# Patient Record
Sex: Male | Born: 1945 | Race: White | Hispanic: No | Marital: Single | Smoking: Former smoker
Health system: Southern US, Community
[De-identification: ages and names within clinical notes are randomized; demographics above are authoritative.]

## PROBLEM LIST (undated history)

## (undated) DIAGNOSIS — I10 Essential (primary) hypertension: Secondary | ICD-10-CM

## (undated) DIAGNOSIS — F32A Depression, unspecified: Secondary | ICD-10-CM

## (undated) DIAGNOSIS — I2582 Chronic total occlusion of coronary artery: Secondary | ICD-10-CM

## (undated) DIAGNOSIS — J449 Chronic obstructive pulmonary disease, unspecified: Secondary | ICD-10-CM

## (undated) DIAGNOSIS — E785 Hyperlipidemia, unspecified: Secondary | ICD-10-CM

## (undated) DIAGNOSIS — F209 Schizophrenia, unspecified: Secondary | ICD-10-CM

## (undated) DIAGNOSIS — K219 Gastro-esophageal reflux disease without esophagitis: Secondary | ICD-10-CM

## (undated) DIAGNOSIS — F329 Major depressive disorder, single episode, unspecified: Secondary | ICD-10-CM

---
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
## 2008-08-03 ENCOUNTER — Ambulatory Visit: Payer: Self-pay | Admitting: Gastroenterology

## 2012-06-09 ENCOUNTER — Ambulatory Visit: Payer: Self-pay | Admitting: Vascular Surgery

## 2012-06-09 LAB — CREATININE, SERUM: Creatinine: 1.05 mg/dL (ref 0.60–1.30)

## 2012-06-09 LAB — PROTIME-INR
INR: 1.8
Prothrombin Time: 20.6 secs — ABNORMAL HIGH (ref 11.5–14.7)

## 2012-06-16 ENCOUNTER — Ambulatory Visit: Payer: Self-pay | Admitting: Vascular Surgery

## 2012-06-16 LAB — BUN: BUN: 17 mg/dL (ref 7–18)

## 2012-06-16 LAB — CREATININE, SERUM: EGFR (African American): >60

## 2012-06-16 LAB — PROTIME-INR
INR: 1.7
Prothrombin Time: 20.2 secs — ABNORMAL HIGH (ref 11.5–14.7)

## 2012-07-16 ENCOUNTER — Inpatient Hospital Stay: Payer: Self-pay | Admitting: Vascular Surgery

## 2012-07-16 LAB — CBC WITH DIFFERENTIAL/PLATELET
Basophil %: 0.5 %
Eosinophil #: 0.2 10*3/uL (ref 0.0–0.7)
Eosinophil %: 2.8 %
HCT: 35 % — ABNORMAL LOW (ref 40.0–52.0)
HGB: 11.7 g/dL — ABNORMAL LOW (ref 13.0–18.0)
Lymphocyte %: 22.4 %
MCH: 28.1 pg (ref 26.0–34.0)
MCHC: 33.5 g/dL (ref 32.0–36.0)
MCV: 84 fL (ref 80–100)
RDW: 13.7 % (ref 11.5–14.5)
WBC: 6.1 10*3/uL (ref 3.8–10.6)

## 2012-07-16 LAB — BUN: BUN: 13 mg/dL (ref 7–18)

## 2012-07-16 LAB — HEMOGLOBIN: HGB: 12.1 g/dL — ABNORMAL LOW (ref 13.0–18.0)

## 2012-07-16 LAB — FIBRINOGEN: Fibrinogen: 409 mg/dL (ref 210–470)

## 2012-07-16 LAB — CREATININE, SERUM
Creatinine: 1.16 mg/dL (ref 0.60–1.30)
EGFR (African American): >60

## 2012-07-16 LAB — PROTIME-INR: Prothrombin Time: 19.8 secs — ABNORMAL HIGH (ref 11.5–14.7)

## 2012-07-16 LAB — APTT: Activated PTT: 41.4 secs — ABNORMAL HIGH (ref 23.6–35.9)

## 2012-07-16 LAB — PLATELET COUNT: Platelet: 201 10*3/uL (ref 150–440)

## 2012-07-17 LAB — CBC WITH DIFFERENTIAL/PLATELET
Basophil #: 0 10*3/uL (ref 0.0–0.1)
Eosinophil %: 3 %
HCT: 34.2 % — ABNORMAL LOW (ref 40.0–52.0)
HGB: 11.8 g/dL — ABNORMAL LOW (ref 13.0–18.0)
Lymphocyte %: 20.4 %
MCH: 28.6 pg (ref 26.0–34.0)
MCHC: 34.4 g/dL (ref 32.0–36.0)
Monocyte #: 0.7 x10 3/mm (ref 0.2–1.0)
Neutrophil #: 4 10*3/uL (ref 1.4–6.5)
Neutrophil %: 64.9 %
Platelet: 174 10*3/uL (ref 150–440)
RBC: 4.12 10*6/uL — ABNORMAL LOW (ref 4.40–5.90)
RDW: 13.9 % (ref 11.5–14.5)
WBC: 6.1 10*3/uL (ref 3.8–10.6)

## 2012-07-17 LAB — BASIC METABOLIC PANEL
Co2: 31 mmol/L (ref 21–32)
Creatinine: 1.09 mg/dL (ref 0.60–1.30)
EGFR (African American): >60
EGFR (Non-African Amer.): >60
Glucose: 95 mg/dL (ref 65–99)
Potassium: 4 mmol/L (ref 3.5–5.1)
Sodium: 140 mmol/L (ref 136–145)

## 2012-07-17 LAB — FIBRINOGEN
Fibrinogen: 258 mg/dL (ref 210–470)
Fibrinogen: 277 mg/dL (ref 210–470)

## 2012-07-30 ENCOUNTER — Ambulatory Visit: Payer: Self-pay | Admitting: Vascular Surgery

## 2012-07-30 LAB — BUN: BUN: 11 mg/dL (ref 7–18)

## 2012-07-30 LAB — CREATININE, SERUM
Creatinine: 1.12 mg/dL (ref 0.60–1.30)
EGFR (African American): >60

## 2012-08-15 DIAGNOSIS — I739 Peripheral vascular disease, unspecified: Secondary | ICD-10-CM | POA: Insufficient documentation

## 2013-07-29 IMAGING — XA IR VASCULAR PROCEDURE
12 of 15 series · 15 of 24 positions shown · IV contrast (IODINE)
Comparison: none

[Series 1: care sfa · 1 of 2 slices shown (1 of 9)]
[im 1/2]
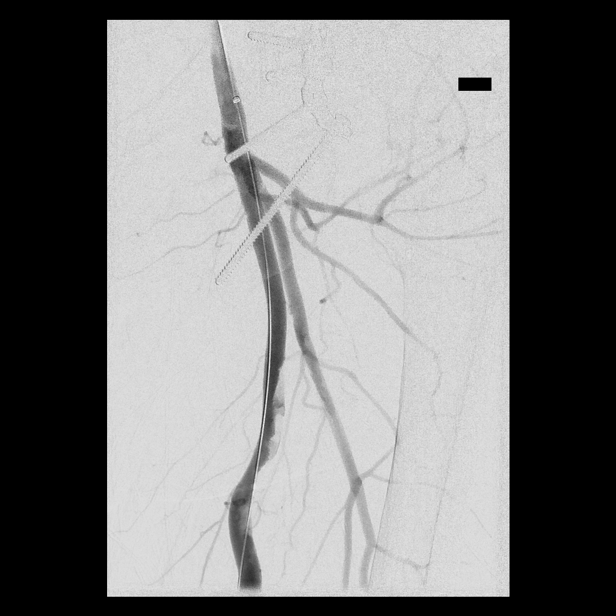

[Series 3: care sfa · 1 of 2 slices shown (2 of 9)]
[im 1/2]
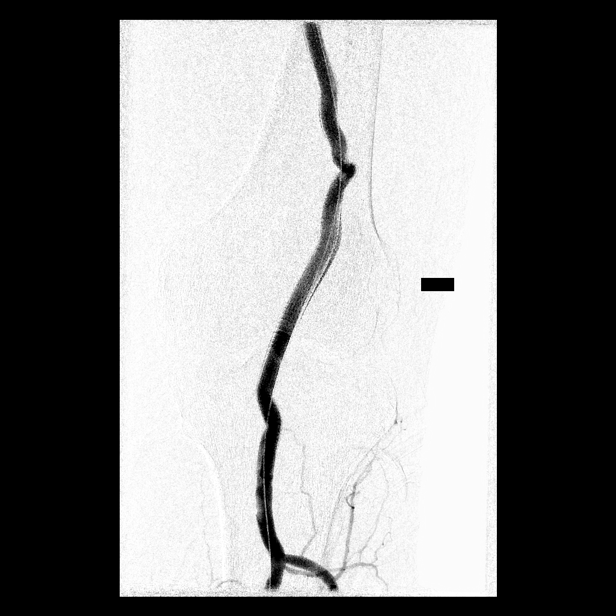

[Series 4: care sfa · 2 of 2 slices shown (3 of 9)]
[im 1/2]
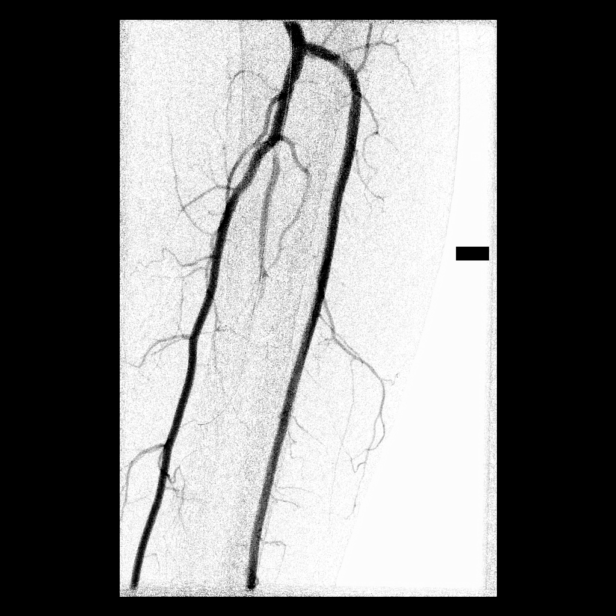
[im 2/2]
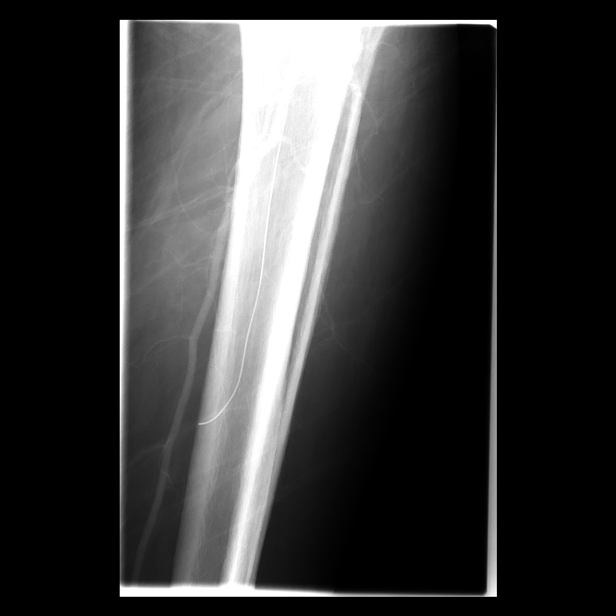

[Series 5: care sfa · 2 of 3 slices shown (4 of 9)]
[im 2/3]
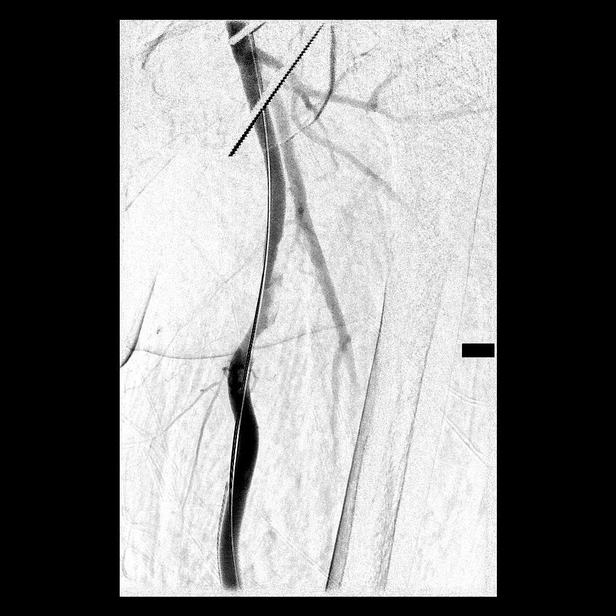
[im 3/3]
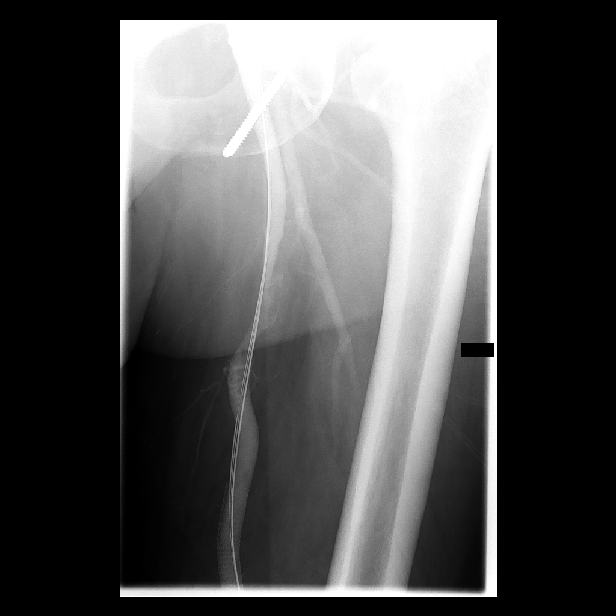

[Series 6: care sfa · 1 of 2 slices shown (5 of 9)]
[im 2/2]
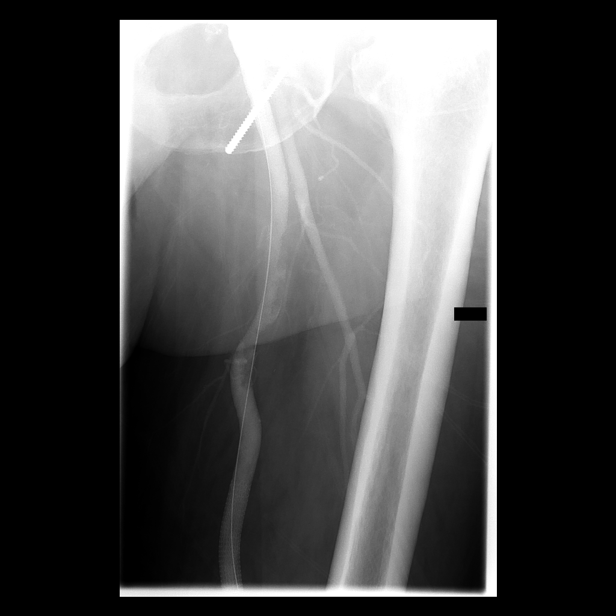

[Series 8: fl - angio · 1 of 1 slices shown (1 of 3)]
[im 1/1]
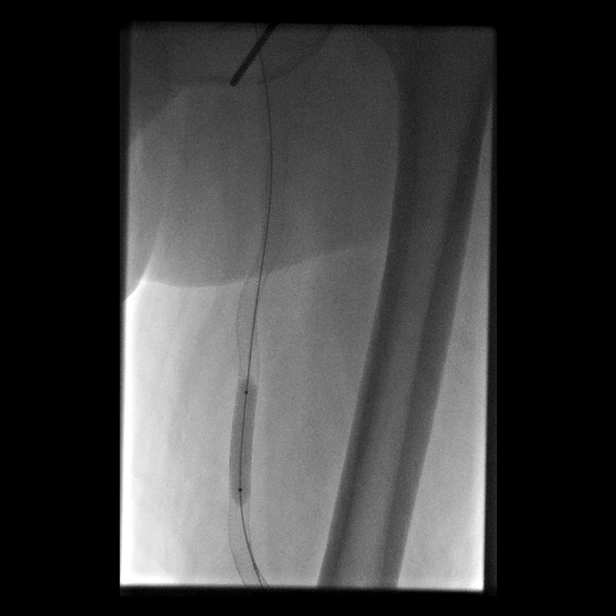

[Series 9: fl - angio · 1 of 1 slices shown (2 of 3)]
[im 1/1]
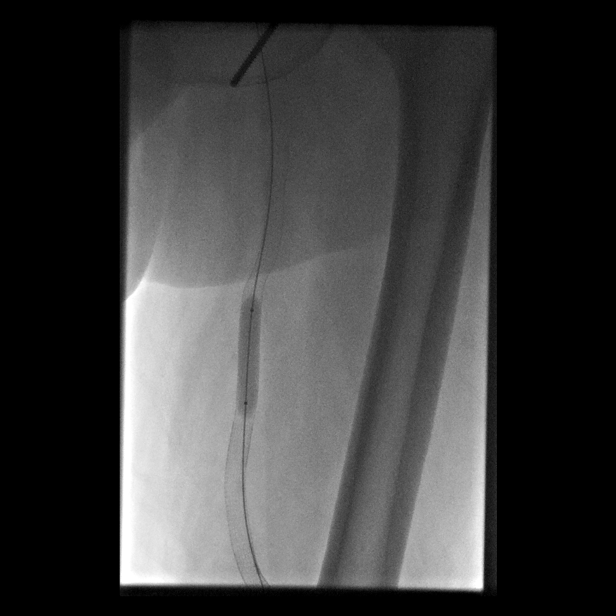

[Series 11: fl - angio · 1 of 1 slices shown (3 of 3)]
[im 1/1]
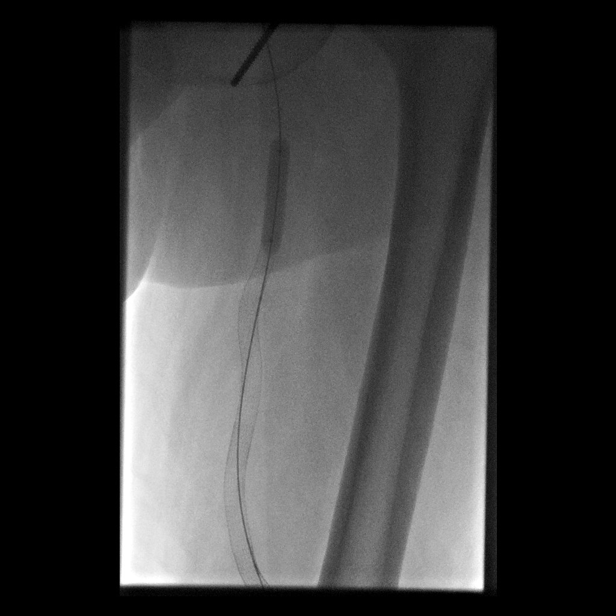

[Series 12: care sfa · 1 of 2 slices shown (6 of 9)]
[im 1/2]
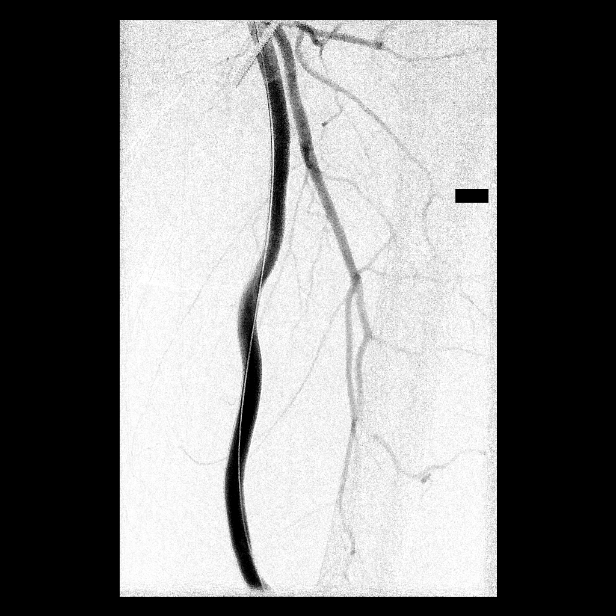

[Series 13: care sfa · 1 of 2 slices shown (7 of 9)]
[im 1/2]
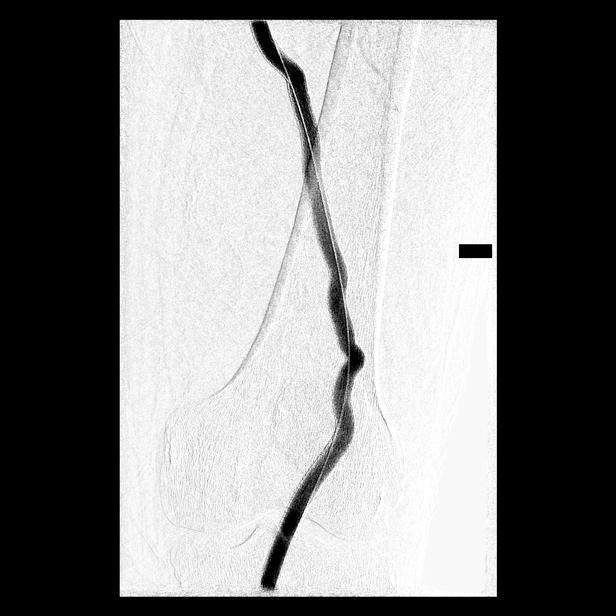

[Series 14: care sfa · 2 of 2 slices shown (8 of 9)]
[im 1/2]
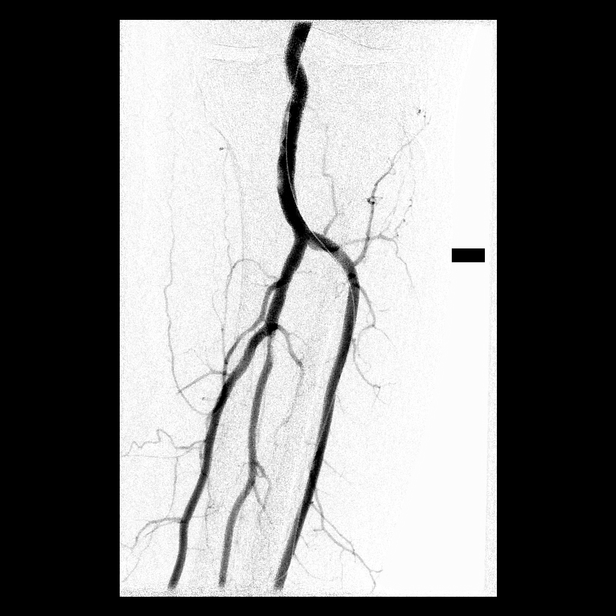
[im 2/2]
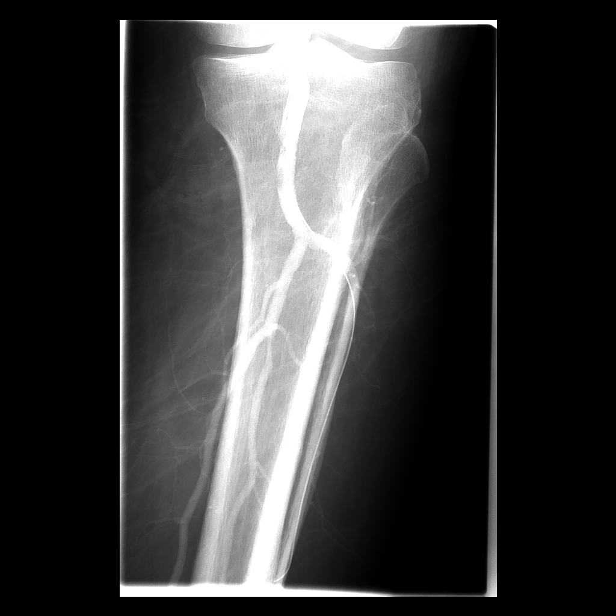

[Series 15: care sfa · 1 of 2 slices shown (9 of 9)]
[im 2/2]
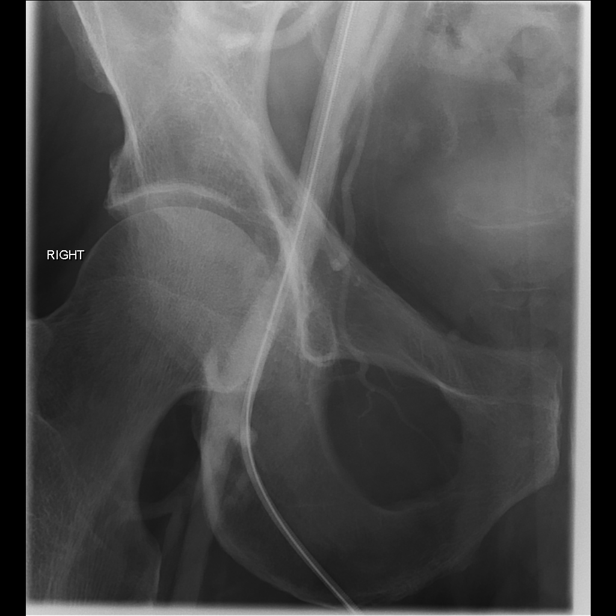

[15 of 24 positions shown; findings below may reference images not displayed]

IMAGES IMPORTED FROM THE [employer] WORKFLOW SYSTEM
NO DICTATION FOR STUDY

## 2014-04-30 NOTE — Discharge Summary (Signed)
PATIENT NAME:  Anthony Chambers, Anthony Chambers MR#:  [id number] DATE OF BIRTH:  November 20, 1945  DATE OF ADMISSION:  07/16/2012 DATE OF DISCHARGE:  07/18/2012.   ADMITTING DIAGNOSES: 1.  Ischemic left lower extremity.  2.  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  3.  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   DISCHARGE DIAGNOSES: 1.  Ischemic left lower extremity.  2.  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  3.  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   PROCEDURE PERFORMED WHILE IN THE HOSPITAL:  Percutaneous thrombolysis and revascularization of the left lower extremity on July 9th and July 17, 2012.   BRIEF HISTORY:  The patient is a 69 year old white male with multiple issues who had about a month ago very large popliteal and SFA aneurysms treated with stent placement. These thrombosed and he had an ischemic left lower extremity with pain. He was brought in for an attempt at opening these and limb salvage.   HOSPITAL COURSE:  The patient was admitted, taken to the Vascular Interventional Radiology Suite where percutaneous thrombolysis initiated on 07/16/2012. He was admitted to the Critical Care Unit overnight with continuous thrombolysis and taken back the next morning where we were able to completely revascularize his left lower extremity. By the following morning, he had 2+ pedal pulses on the left, and his right groin looked fine. His perfusion was intact; however, he was markedly weak and below his normal baseline of functioning. We had Physical Therapy to the patient who did not feel he was safe to go home and he was recommended for short-term rehab. He will be discharged to short-term rehab.  MEDICATIONS:  Will be 1.  Ketoconazole topical to apply twice weekly to affected areas.  2.  Olanzapine 15 mg b.i.d.  3.  Omeprazole 20 mg daily.  4.  Paxil 40 mg daily.   5.  Risperdal 3 mg b.i.d.  6.  Zocor 20 mg daily.  7.  Ziprasidone 60 mg daily.  8.  Aspirin 81 mg daily.  9.  Xarelto 15 mg b.i.d. for 3 weeks and then 20 mg daily thereafter.  10.  Hydrocodone/acetaminophen as needed for pain.  He will follow up in our office in about 2 to 3 weeks with ABIs. His activity is as tolerated. His diet is regular. His dressing can be replaced as necessary. They can contact our office for any fevers greater than 101, severe pain, wound issues or other problems.   ____________________________ Annice NeedyJason S. Dew, MD jsd:jm D: 07/18/2012 15:20:09 ET T: 07/18/2012 15:41:01 ET JOB#: [id number]  cc: Annice NeedyJason S. Dew, MD, <Dictator> Annice NeedyJASON S DEW MD ELECTRONICALLY SIGNED 07/24/2012 9:51

## 2014-04-30 NOTE — Op Note (Signed)
PATIENT NAME:  Anthony Chambers, Thurston C MR#:  [id number] DATE OF BIRTH:  [birth year]  DATE OF PROCEDURE:  07/30/2012  PREOPERATIVE DIAGNOSES: 1.  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  2.  HEPARIN ALLERGY.  3.  Status post previous repair with heparin-coated stents with subsequent stent thrombosis and thrombolysis for ischemic limb.   POSTOPERATIVE DIAGNOSES:  1.  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  2.  HEPARIN ALLERGY.  3.  Status post previous repair with heparin-coated stents with subsequent stent thrombosis and thrombolysis for ischemic limb.   PROCEDURE: 1.  Ultrasound guidance for vascular access, right femoral artery.  2.  Catheter placement into left popliteal artery from right femoral approach.  3.  Left lower extremity angiogram.  4.  Covered stent placement x 4 with two 7 mm diameter and two 8 mm diameter Viabahn nonheparin-coated stents to the left superficial femoral artery and popliteal artery.  5.  StarClose closure device right femoral artery.   SURGEON: Annice NeedyJason S Adnan Vanvoorhis, MD   ANESTHESIA: Local with moderate conscious sedation.   ESTIMATED BLOOD LOSS: 25 mL.   INDICATION FOR PROCEDURE: This is a 69 year old male who is well-known to our service. 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 This was repaired successfully a couple of months ago but had an early thrombosis. He underwent thrombolysis and limb salvage. Due to the unusual early thrombosis, a heparin antibody panel was sent which came back as positive after his last admission, and the previous stents had been heparin-coated. He is brought back to reline the area with nonheparin-coated stents in an attempt to avoid thrombosis. The risks and benefits were discussed. Informed consent was obtained.   DESCRIPTION OF PROCEDURE: The patient was brought to the vascular interventional radiology suite. Groins were shaved and prepped, and a sterile surgical field was created.  Ultrasound was used to visualize a patent right femoral artery. It was accessed under direct ultrasound guidance without difficulty with a Seldinger needle. A J-wire and 5-French sheath were placed. I used a RIM catheter and the Advantage wire to cross the aortic bifurcation and advance to the left femoral head. Selective left lower extremity angiogram was then performed. This showed that currently the stents were patent, and the runoff was maintained distally.  I then gave the patient a dose of Angiomax and placed an 8-French Ansell sheath over a Constellation Brandserumo Advantage Wire. The wire was parked into the peroneal artery, and I then relined from the below-knee popliteal artery with about 2 cm of overlap below the previously placed stent up to the proximal superficial femoral artery with about 2 cm of overlap above the previously placed stent. There was at least 2 cm of overlap between each stent junction, and no area of heparin-coated stent should be now exposed. These were all postdilated with a 7 mm balloon inflated to burst pressure to make 7.7 mm in diameter, and completion angiogram showed them to be widely patent. I then performed a StarClose closure device on the right femoral artery with excellent hemostatic result. The patient tolerated the procedure well and was taken to the recovery room in stable condition.   ____________________________ Annice NeedyJason S. Arris Meyn, MD jsd:cb D: 07/30/2012 13:56:23 ET T: 07/30/2012 17:16:27 ET JOB#: [id number]  cc: Annice NeedyJason S. Keven Soucy, MD, <Dictator> Annice NeedyJASON S Sidonie Dexheimer MD ELECTRONICALLY SIGNED 08/04/2012 12:28

## 2014-04-30 NOTE — Op Note (Signed)
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   PROCEDURES:  1. Catheter placement into left popliteal artery from right femoral approach.  2. Left lower extremity angiogram.  3. Covered stent placement x3 to the left superficial femoral artery and popliteal artery for aneurysm repair using two 7 mm diameter Viabahns and one 8 mm diameter Viabahn stent.  4. StarClose closure device, right femoral artery.   SURGEON: Annice NeedyJason S. Jaisen Wiltrout, MD   ANESTHESIA: Local with moderate conscious sedation.   ESTIMATED BLOOD LOSS: Approximately 50 mL.   INDICATION FOR PROCEDURE: This is a 69 year old white male who has a very large left SFA and popliteal artery aneurysm. This was seen on a previous ultrasound, and I performed a diagnostic study for measurements and sizing last week. He is here for definitive repair today with covered stents.   DESCRIPTION OF THE PROCEDURE: The patient was brought to the vascular interventional radiology suite. Groins were shaved and prepped, and sterile surgical field is created. The right femoral head was localized with fluoroscopy. Ultrasound was used to access the right femoral artery, which was patent. This was done under direct ultrasound guidance without difficulty with Seldinger needle and permanent image recorded. A 5 French sheath was placed. As we had already done an aortogram, we did not perform an aortogram today, and crossed the aortic bifurcation with a RIM catheter and placed a Terumo Advantage wire. I then placed a 7 French Ansel sheath and performed left lower extremity imaging. I was able to navigate through the multiple areas of tortuosity and dilatation of the aneurysm and gain catheter access into the popliteal artery  with the Terumo Advantage wire and a Kumpe catheter. I then exchanged for an 0.018 wire. There was significant stored energy over the wire, but ultimately I was able to track and cover the entirety of the lesion. This took 3 Viabahn stents to cover the extensive disease. This went down to the distal popliteal artery and up into the mid to proximal superficial femoral artery. The 2 distal stents were 7 mm diameter Viabahn stents, and the proximal stent with an 8 mm diameter Viabahn stent. These were then postdilated, after exchanging for an 0.035 wire, with a 7 mm balloon in the distal portion and then an 8 mm balloon throughout the mid and proximal portions of the stents. There was about 3 to 4 cm of overlap between all stents at least and about 4 to 5 cm of nonaneurysmal artery distally and about 5 cm of nonaneurysmal artery proximally, but we seemed to be sealed in with the Viabahn stents. At this point, completion angiogram was performed which showed exclusion of the aneurysm. There did appear to be what would equate to a type II endoleak through branches into there, but this was a small amount of flow, and the stents were patent. The runoff was maintained distally, and he had palpable pulses in his feet. At this point, I terminated the procedure. The sheath was pulled back to the ipsilateral external iliac artery, and oblique arteriogram was performed. StarClose closure device was deployed in the usual fashion with excellent hemostatic result. The patient tolerated the procedure well and was taken to the recovery room in stable condition.    ____________________________ Annice NeedyJason S. Savvas Roper, MD jsd:OSi D: 06/16/2012 18:10:35  ET T: 06/17/2012 05:48:04 ET JOB#: [id number]  cc: Annice Needy, MD, <Dictator> Annice Needy MD ELECTRONICALLY SIGNED 06/18/2012 11:00

## 2014-04-30 NOTE — Op Note (Signed)
PATIENT NAME:  Anthony Chambers, [name] MR#:  [id number] DATE OF BIRTH:  08-19-1945  DATE OF PROCEDURE:  06/09/2012  PREOPERATIVE DIAGNOSES: 1.  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  2.  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  3.  Hyperlipidemia.   POSTOPERATIVE DIAGNOSES:   1.  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  2.  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  3.  Hyperlipidemia.   PROCEDURES: 1.  Ultrasound guidance for vascular access, right femoral artery.  2.  Catheter placed in the left superficial femoral artery and.  3.  Aortogram and selective left lower extremity angiogram.  4.  StarClose closure of right femoral artery.   SURGEON: Annice NeedyJason S. Kareli Hossain, M.D.   ANESTHESIA: Local with moderate conscious sedation.   ESTIMATED BLOOD LOSS: Approximately 25 mL   CONTRAST USED: 70 mL Visipaque.   INDICATION FOR PROCEDURE: This is a 69 year old white male with a large SFA and popliteal artery aneurysm. This was found last week on noninvasive studies. He is brought in for angiography for further evaluation for best form of  treatment. Risks and benefits were discussed. Informed consent was obtained.   DESCRIPTION OF PROCEDURE: The patient is brought to the vascular interventional radiology suite. The groins were sterilely prepped and draped and a sterile surgical field is created.  The right femoral head was localized with fluoroscopy.  The right femoral artery was visualized with ultrasound and found to be patent. It was then accessed with direct ultrasound guidance without difficulty with Seldinger needle. A J-wire and 5 French sheath were placed. Pigtail catheter was placed in the aorta at the L1 level and AP aortogram was performed. This showed reasonably large aorta and iliac arteries. These were not aneurysmal and there were widely patent. I then hooked the aortic bifurcation and advanced to the left femoral head and selective left lower  extremity angiogram was then performed. This showed a normal common femoral artery, profunda femoris artery and proximal superficial femoral artery.  The superficial femoral artery up in this area is generous, in the 7 to 8 mm diameter range. In the mid to distal superficial femoral artery, there began a very large aneurysm. This had about 5 cm diameter flow lumen. There was an ectatic, but not aneurysmal portion just below Hunter's canal and in the popliteal artery was aneurysmal just above the knee with stenosis in the popliteal artery just below the knee. The below-knee popliteal artery was 5 to 6 mm range. There was a normal tibial trifurcation and two vessel runoff distally. I did pass a wire through there and was able to cross the lesion. However, we did not have appropriate length and size and Viabahn stents to treat this today and these have to be special ordered due to the large size in length of the aneurysm. The total length of the aneurysm was over 20 cm to 25 cm and there were caliber changes that will likely result placement of 2 to 3 stents at the time of repair.  At this point, I elected to terminate the procedure. The diagnostic catheter was removed. Oblique arteriogram was performed of the right femoral artery and StarClose closure device was deployed in the usual fashion with excellent hemostatic result. The patient tolerated the procedure well and was taken to the recovery room in stable condition.   ____________________________ Annice NeedyJason S. Ijanae Macapagal, MD jsd:cc D: 06/09/2012 17:02:04 ET T: 06/09/2012 22:48:03 ET JOB#: [id number]  cc: Barbara CowerJason  Driscilla Grammes, MD, <Dictator> Annice Needy MD ELECTRONICALLY SIGNED 06/18/2012 10:57

## 2014-04-30 NOTE — Op Note (Signed)
PATIENT NAME:  Anthony Chambers, Anthony Chambers MR#:  [id number] DATE OF BIRTH:  23-Nov-1945  DATE OF PROCEDURE:  07/17/2012  PREOPERATIVE DIAGNOSES:  1.  Ischemic rest pain, left lower extremity.  2.  Thrombosis of previous left superficial femoral artery and popliteal aneurysms.  3.  Status post placement of infusion catheter for overnight thrombolysis.   POSTOPERATIVE DIAGNOSES: 1.  Ischemic rest pain, left lower extremity.  2.  Thrombosis of previous left superficial femoral artery and popliteal aneurysms.  3.  Status post placement of infusion catheter for overnight thrombolysis.   PROCEDURE: 1.  Followup angiogram through existing catheter, left lower extremity.  2.  Mechanical rheolytic thrombectomy of left superficial femoral artery for residual thrombus after thrombolysis. 3.  Covered stent placement to the proximal superficial femoral artery for residual thrombus after thrombolysis and thrombectomy.  4.  StarClose closure device, right femoral artery.   SURGEON:  Annice NeedyJason S Dew, M.D.   ANESTHESIA:  Local with moderate conscious sedation.   ESTIMATED BLOOD LOSS:  Minimal.   FLUOROSCOPY TIME:  Approximately 6 minutes and 35 mL of contrast were used.  INDICATION FOR PROCEDURE:  A 69 year old male who was brought down yesterday for initiation of thrombolysis treatment for occluded SFA and popliteal stents that had been placed for aneurysmal disease several weeks ago. He has improved his left lower extremity foot and leg pain after treatment overnight and is brought back for a second-look angiography.   DESCRIPTION OF PROCEDURE:  The patient's groins and existing catheter were sterilely prepped and draped and a sterile surgical field was created. The lysis catheter had TPA running. This was then rewired and imaging was performed with an  Advantage Wire parked into the peroneal artery. This demonstrated that the previous SFA and popliteal stents were now patent, but there was residual thrombus at  approximately 6 to 8 cm above the most proximal stent in the proximal superficial femoral artery. There was maintained runoff distally. I then performed mechanical rheolytic thrombectomy with the Omni catheter for the residual thrombus in the superficial femoral artery. This improved but did not resolve the thrombosis and there was still flow limitation. For this I elected to place an 8-mm diameter x15-cm in length Viabahn covered stent. This required upsizing to a 7-French sheath and exchanged for an 0.018 wire. This was done without difficulty. The stent was deployed. I then re-exchanged for an 0.035 wire and postdilated with an 8-mm diameter angioplasty balloon. An area of somewhat more marked tortuosity in the distal superficial femoral artery and above-knee popliteal artery was also treated with the 8-mm diameter angioplasty balloon to ensure that this was not flow-limiting and it was not. Completion angiogram following this showed resolution with improvement and exclusion of the thrombus, brisk flow through the stents and 3-vessel runoff distally. At this point, I elected to terminate the procedure. The sheath was removed. A StarClose closure device was deployed in the usual fashion with excellent hemostatic result. The patient tolerated the procedure well and was taken to the Recovery Room in stable condition.   ____________________________ Annice NeedyJason S. Dew, MD jsd:jm D: 07/17/2012 09:51:54 ET T: 07/17/2012 10:33:47 ET JOB#: [id number]  cc: Annice NeedyJason S. Dew, MD, <Dictator> Annice NeedyJASON S DEW MD ELECTRONICALLY SIGNED 07/24/2012 9:51

## 2014-04-30 NOTE — Op Note (Signed)
PATIENT NAME:  Anthony Chambers, Anthony Chambers MR#:  [id number] DATE OF BIRTH:  29-Jun-1945  DATE OF PROCEDURE:  07/16/2012  PREOPERATIVE DIAGNOSES: 1.  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  2.  Rest pain, left lower extremity.  3.  Thrombosis of previously placed stents for superficial femoral artery and popliteal aneurysm.  4.  History of deep venous thrombosis, on chronic anticoagulation.   POSTOPERATIVE DIAGNOSES: 1.  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  2.  Rest pain, left lower extremity.  3.  Thrombosis of previously placed stents for superficial femoral artery and popliteal aneurysm.  4.  History of deep venous thrombosis, on chronic anticoagulation.   PROCEDURES:   1.  Ultrasound guidance for vascular access, right femoral artery.  2.  Catheter placement to left peroneal artery from right femoral approach.  3.  Left lower extremity angiogram.  4.  Catheter-directed thrombolysis with 8 mg of tPA to the left superficial femoral artery, popliteal artery, tibioperoneal trunk and peroneal artery.  5.  Mechanical rheolytic thrombectomy to same vessels.  6.  Placement of continuous infusion catheter for overnight thrombolysis, 135 total length, 50 cm working length, with the distal tip in the tibioperoneal trunk and the proximal tip in the proximal superficial femoral artery on the left.  7.  Central line placement, right jugular vein, with ultrasound guidance.   SURGEON: Annice Needy, M.D.   ANESTHESIA: Local with moderate conscious sedation.   ESTIMATED BLOOD LOSS: 25 mL.   INDICATION FOR PROCEDURE: A 69 year old white male who had a very large SFA and popliteal aneurysm treated last month endovascularly. This segment was over 6 cm in diameter and it required 3 stents to be placed. He was maintained on anticoagulation in the form of Coumadin. However, he was subtherapeutic and his INR was less than 2, and he presented this week with pain in the left foot and  leg and a markedly reduced ABI. Noninvasive study showed thrombosis of the left SFA, popliteal artery in  the previously placed stents. He is brought in today for an attempt to reopen this. This is a clearly limb-threatening situation and we are proceeding to try to salvage his leg. Risks and benefits were discussed. Informed consent was obtained.   DESCRIPTION OF PROCEDURE: The patient is brought to the vascular and interventional radiology suite. Groins were shaved and prepped and a sterile surgical field was created. Ultrasound was used to visualize a patent right femoral artery. It was then accessed under direct ultrasound guidance without difficulty with a Seldinger needle. A J-wire and a 5-French sheath were then placed. A RIM catheter was used to hook the aortic bifurcation, and I advanced to the left femoral head and selective left lower extremity angiogram was then performed. This showed occlusion of the superficial femoral artery 7 to 10 cm beyond its origin. This was thrombosed and very little opacification was seen distally. I then gave the patient 5000 units of intravenous heparin for systemic anticoagulation, and a 6-French Ansel sheath was placed over the Terumo Advantage wire. I then took the Terumo Advantage wire and a Kumpe catheter and was able to traverse through the occluded superficial femoral and popliteal artery stents and advance into the peroneal artery distally. The diagnostic catheter was removed. The 120 cm length AngioJet catheter was then brought onto the field. I instilled 8 mg of tPA in 100 mL in a power-pulse spray fashion from the proximal superficial femoral artery down into the peroneal artery. This was allowed to dwell  for between 15 and 20 minutes, and then mechanical rheolytic thrombectomy was performed on the left superficial femoral artery, popliteal artery, tibioperoneal trunk and peroneal artery, nearly 300 mL of effluent was returned. Following this, there was still  thrombosis and occlusion of the left SFA. At this point, I elected to place an infusion catheter for overnight thrombolysis, and he will be brought back tomorrow for repeat angiography for further evaluation. A 135 total length, 50 cm working length catheter was then brought onto the field, taken over the wire. Its distal tip was parked in the tibioperoneal trunk, the proximal tip was placed in the proximal superficial femoral artery, and this will be connected to a continuous tPA infusion. Heparin will be run through the sheath given his early thrombosis with the heparin-coated stents. A thought of a possible heparin antibody was also raised, and we drew blood for a heparin assay to be performed. The sheath and infusion catheter were secured to the right groin skin with silk sutures.   At this point, I turned my attention to placing a durable venous access. In patients receiving continuous infusion thrombolysis in our institution, central lines are generally placed. The right neck and chest were sterilely prepped and draped and a sterile surgical field was created. The right jugular vein was visualized with ultrasound and found to be patent. It was then accessed under direct ultrasound guidance without difficulty with a Seldinger needle. A J-wire was placed. After skin nick and dilatation, the triple lumen catheter was placed over the wire and the wire was removed. All 3 lumens withdrew dark red nonpulsatile blood and flushed easily with sterile saline. It was secured at the skin at 19 cm with 3 silk sutures and a sterile dressing was placed. Fluoroscopy was used to confirm the catheter tip at the cavoatrial junction in excellent location. The patient tolerated the procedure well and was taken to the recovery room in stable condition.     ____________________________ Annice NeedyJason S. Dew, MD jsd:jm D: 07/16/2012 14:25:09 ET T: 07/16/2012 18:13:15 ET JOB#: [id number]  cc: Annice NeedyJason S. Dew, MD, <Dictator> Annice NeedyJASON S DEW  MD ELECTRONICALLY SIGNED 07/24/2012 9:50

## 2016-07-16 DIAGNOSIS — R262 Difficulty in walking, not elsewhere classified: Secondary | ICD-10-CM | POA: Insufficient documentation

## 2016-07-16 DIAGNOSIS — M6281 Muscle weakness (generalized): Secondary | ICD-10-CM | POA: Insufficient documentation

## 2018-06-01 ENCOUNTER — Inpatient Hospital Stay
Admission: EM | Admit: 2018-06-01 | Discharge: 2018-06-09 | DRG: 189 | Disposition: A | Discharge status: Hospice | Payer: Medicare Other | Source: Skilled Nursing Facility | Attending: Internal Medicine | Admitting: Internal Medicine

## 2018-06-01 ENCOUNTER — Emergency Department: Payer: Medicare Other

## 2018-06-01 ENCOUNTER — Other Ambulatory Visit: Payer: Self-pay

## 2018-06-01 ENCOUNTER — Encounter: Payer: Self-pay | Admitting: Emergency Medicine

## 2018-06-01 DIAGNOSIS — F209 Schizophrenia, unspecified: Secondary | ICD-10-CM | POA: Diagnosis present

## 2018-06-01 DIAGNOSIS — I4821 Permanent atrial fibrillation: Secondary | ICD-10-CM | POA: Diagnosis present

## 2018-06-01 DIAGNOSIS — Z66 Do not resuscitate: Secondary | ICD-10-CM | POA: Diagnosis not present

## 2018-06-01 DIAGNOSIS — F329 Major depressive disorder, single episode, unspecified: Secondary | ICD-10-CM | POA: Diagnosis present

## 2018-06-01 DIAGNOSIS — I878 Other specified disorders of veins: Secondary | ICD-10-CM | POA: Diagnosis present

## 2018-06-01 DIAGNOSIS — J441 Chronic obstructive pulmonary disease with (acute) exacerbation: Secondary | ICD-10-CM | POA: Diagnosis present

## 2018-06-01 DIAGNOSIS — I472 Ventricular tachycardia: Secondary | ICD-10-CM | POA: Diagnosis not present

## 2018-06-01 DIAGNOSIS — I493 Ventricular premature depolarization: Secondary | ICD-10-CM | POA: Diagnosis present

## 2018-06-01 DIAGNOSIS — Z888 Allergy status to other drugs, medicaments and biological substances status: Secondary | ICD-10-CM

## 2018-06-01 DIAGNOSIS — Z20828 Contact with and (suspected) exposure to other viral communicable diseases: Secondary | ICD-10-CM | POA: Diagnosis present

## 2018-06-01 DIAGNOSIS — I5033 Acute on chronic diastolic (congestive) heart failure: Secondary | ICD-10-CM | POA: Diagnosis present

## 2018-06-01 DIAGNOSIS — J9601 Acute respiratory failure with hypoxia: Principal | ICD-10-CM

## 2018-06-01 DIAGNOSIS — K5900 Constipation, unspecified: Secondary | ICD-10-CM | POA: Diagnosis present

## 2018-06-01 DIAGNOSIS — Z87891 Personal history of nicotine dependence: Secondary | ICD-10-CM | POA: Diagnosis not present

## 2018-06-01 DIAGNOSIS — I739 Peripheral vascular disease, unspecified: Secondary | ICD-10-CM | POA: Diagnosis present

## 2018-06-01 DIAGNOSIS — F0390 Unspecified dementia without behavioral disturbance: Secondary | ICD-10-CM | POA: Diagnosis present

## 2018-06-01 DIAGNOSIS — K219 Gastro-esophageal reflux disease without esophagitis: Secondary | ICD-10-CM | POA: Diagnosis present

## 2018-06-01 DIAGNOSIS — G9349 Other encephalopathy: Secondary | ICD-10-CM | POA: Diagnosis not present

## 2018-06-01 DIAGNOSIS — J9622 Acute and chronic respiratory failure with hypercapnia: Secondary | ICD-10-CM | POA: Diagnosis present

## 2018-06-01 DIAGNOSIS — I110 Hypertensive heart disease with heart failure: Secondary | ICD-10-CM | POA: Diagnosis present

## 2018-06-01 DIAGNOSIS — J9811 Atelectasis: Secondary | ICD-10-CM | POA: Diagnosis present

## 2018-06-01 DIAGNOSIS — Z7901 Long term (current) use of anticoagulants: Secondary | ICD-10-CM | POA: Diagnosis not present

## 2018-06-01 DIAGNOSIS — I872 Venous insufficiency (chronic) (peripheral): Secondary | ICD-10-CM | POA: Diagnosis present

## 2018-06-01 DIAGNOSIS — J9621 Acute and chronic respiratory failure with hypoxia: Secondary | ICD-10-CM | POA: Diagnosis present

## 2018-06-01 DIAGNOSIS — R0602 Shortness of breath: Secondary | ICD-10-CM

## 2018-06-01 DIAGNOSIS — Z79899 Other long term (current) drug therapy: Secondary | ICD-10-CM | POA: Diagnosis not present

## 2018-06-01 DIAGNOSIS — E785 Hyperlipidemia, unspecified: Secondary | ICD-10-CM | POA: Diagnosis present

## 2018-06-01 DIAGNOSIS — J9600 Acute respiratory failure, unspecified whether with hypoxia or hypercapnia: Secondary | ICD-10-CM

## 2018-06-01 DIAGNOSIS — J9602 Acute respiratory failure with hypercapnia: Principal | ICD-10-CM

## 2018-06-01 DIAGNOSIS — I2510 Atherosclerotic heart disease of native coronary artery without angina pectoris: Secondary | ICD-10-CM | POA: Diagnosis present

## 2018-06-01 DIAGNOSIS — E874 Mixed disorder of acid-base balance: Secondary | ICD-10-CM | POA: Diagnosis present

## 2018-06-01 DIAGNOSIS — F79 Unspecified intellectual disabilities: Secondary | ICD-10-CM | POA: Diagnosis present

## 2018-06-01 DIAGNOSIS — Z825 Family history of asthma and other chronic lower respiratory diseases: Secondary | ICD-10-CM | POA: Diagnosis not present

## 2018-06-01 HISTORY — DX: Schizophrenia, unspecified: F20.9

## 2018-06-01 HISTORY — DX: Gastro-esophageal reflux disease without esophagitis: K21.9

## 2018-06-01 HISTORY — DX: Acute respiratory failure with hypoxia: J96.02

## 2018-06-01 HISTORY — DX: Essential (primary) hypertension: I10

## 2018-06-01 HISTORY — DX: Hyperlipidemia, unspecified: E78.5

## 2018-06-01 HISTORY — DX: Chronic total occlusion of coronary artery: I25.82

## 2018-06-01 HISTORY — DX: Acute respiratory failure with hypoxia: J96.01

## 2018-06-01 HISTORY — DX: Depression, unspecified: F32.A

## 2018-06-01 LAB — CBC WITH DIFFERENTIAL/PLATELET
Abs Immature Granulocytes: 0.08 10*3/uL — ABNORMAL HIGH (ref 0.00–0.07)
Basophils Absolute: 0 10*3/uL (ref 0.0–0.1)
Basophils Relative: 1 %
Eosinophils Absolute: 0.1 10*3/uL (ref 0.0–0.5)
Eosinophils Relative: 2 %
HCT: 43.2 % (ref 39.0–52.0)
Hemoglobin: 12.6 g/dL — ABNORMAL LOW (ref 13.0–17.0)
Immature Granulocytes: 1 %
Lymphocytes Relative: 16 %
Lymphs Abs: 0.9 10*3/uL (ref 0.7–4.0)
MCH: 25.3 pg — ABNORMAL LOW (ref 26.0–34.0)
MCHC: 29.2 g/dL — ABNORMAL LOW (ref 30.0–36.0)
MCV: 86.7 fL (ref 80.0–100.0)
Monocytes Absolute: 0.5 10*3/uL (ref 0.1–1.0)
Monocytes Relative: 8 %
Neutro Abs: 4.3 10*3/uL (ref 1.7–7.7)
Neutrophils Relative %: 72 %
Platelets: 170 10*3/uL (ref 150–400)
RBC: 4.98 MIL/uL (ref 4.22–5.81)
RDW: 14.8 % (ref 11.5–15.5)
WBC: 6 10*3/uL (ref 4.0–10.5)
nRBC: 0 % (ref 0.0–0.2)

## 2018-06-01 LAB — BASIC METABOLIC PANEL
Anion gap: 5 (ref 5–15)
BUN: 21 mg/dL (ref 8–23)
CO2: 37 mmol/L — ABNORMAL HIGH (ref 22–32)
Calcium: 8.3 mg/dL — ABNORMAL LOW (ref 8.9–10.3)
Chloride: 100 mmol/L (ref 98–111)
Creatinine, Ser: 0.94 mg/dL (ref 0.61–1.24)
GFR calc Af Amer: >60 mL/min (ref >60)
GFR calc non Af Amer: >60 mL/min (ref >60)
Glucose, Bld: 160 mg/dL — ABNORMAL HIGH (ref 70–99)
Potassium: 4.4 mmol/L (ref 3.5–5.1)
Sodium: 142 mmol/L (ref 135–145)

## 2018-06-01 LAB — BLOOD GAS, VENOUS
Acid-Base Excess: 7.7 mmol/L — ABNORMAL HIGH (ref 0.0–2.0)
Bicarbonate: 38.6 mmol/L — ABNORMAL HIGH (ref 20.0–28.0)
O2 Saturation: 31.5 %
Patient temperature: 37
pCO2, Ven: 90 mmHg (ref 44.0–60.0)
pH, Ven: 7.24 — ABNORMAL LOW (ref 7.250–7.430)
pO2, Ven: <31 mmHg — CL (ref 32.0–45.0)

## 2018-06-01 LAB — LACTIC ACID, PLASMA: Lactic Acid, Venous: 1.2 mmol/L (ref 0.5–1.9)

## 2018-06-01 LAB — SARS CORONAVIRUS 2 BY RT PCR (HOSPITAL ORDER, PERFORMED IN ~~LOC~~ HOSPITAL LAB): SARS Coronavirus 2: NEGATIVE

## 2018-06-01 LAB — BRAIN NATRIURETIC PEPTIDE: B Natriuretic Peptide: 815 pg/mL — ABNORMAL HIGH (ref 0.0–100.0)

## 2018-06-01 MED ORDER — ONDANSETRON HCL 4 MG/2ML IJ SOLN: 4.0000 mg | Freq: Four times a day (QID) | INTRAMUSCULAR | Status: DC | PRN

## 2018-06-01 MED ORDER — FUROSEMIDE 10 MG/ML IJ SOLN
20.0000 mg | Freq: Two times a day (BID) | INTRAMUSCULAR | Status: DC
  Administered 2018-06-01 – 2018-06-03 (×4): 20 mg via INTRAVENOUS
  Filled 2018-06-01 (×4): qty 2

## 2018-06-01 MED ORDER — SIMVASTATIN 20 MG PO TABS
20.0000 mg | ORAL_TABLET | Freq: Every day | ORAL | Status: DC
  Administered 2018-06-01 – 2018-06-08 (×7): 20 mg via ORAL
  Filled 2018-06-01 (×8): qty 1

## 2018-06-01 MED ORDER — ACETAMINOPHEN 650 MG RE SUPP: 650.0000 mg | Freq: Four times a day (QID) | RECTAL | Status: DC | PRN

## 2018-06-01 MED ORDER — PAROXETINE HCL ER 12.5 MG PO TB24
25.0000 mg | ORAL_TABLET | Freq: Every day | ORAL | Status: DC
  Administered 2018-06-02 – 2018-06-06 (×5): 25 mg via ORAL
  Filled 2018-06-01 (×8): qty 2

## 2018-06-01 MED ORDER — FUROSEMIDE 10 MG/ML IJ SOLN
20.0000 mg | Freq: Once | INTRAMUSCULAR | Status: AC
  Administered 2018-06-01: 20 mg via INTRAVENOUS
  Filled 2018-06-01: qty 4

## 2018-06-01 MED ORDER — ACETAMINOPHEN 325 MG PO TABS: 650.0000 mg | ORAL_TABLET | Freq: Four times a day (QID) | ORAL | Status: DC | PRN

## 2018-06-01 MED ORDER — METOPROLOL SUCCINATE ER 50 MG PO TB24
25.0000 mg | ORAL_TABLET | Freq: Every day | ORAL | Status: DC
  Administered 2018-06-02 – 2018-06-06 (×5): 25 mg via ORAL
  Filled 2018-06-01 (×5): qty 1

## 2018-06-01 MED ORDER — VITAMIN B-12 1000 MCG PO TABS
1000.0000 ug | ORAL_TABLET | Freq: Every day | ORAL | Status: DC
  Administered 2018-06-02 – 2018-06-06 (×5): 1000 ug via ORAL
  Filled 2018-06-01 (×5): qty 1

## 2018-06-01 MED ORDER — RIVAROXABAN 20 MG PO TABS
20.0000 mg | ORAL_TABLET | Freq: Every day | ORAL | Status: DC
  Administered 2018-06-01 – 2018-06-06 (×6): 20 mg via ORAL
  Filled 2018-06-01 (×9): qty 1

## 2018-06-01 MED ORDER — OLANZAPINE 10 MG PO TABS
30.0000 mg | ORAL_TABLET | Freq: Every day | ORAL | Status: DC
  Administered 2018-06-01 – 2018-06-05 (×5): 30 mg via ORAL
  Filled 2018-06-01 (×6): qty 3

## 2018-06-01 MED ORDER — RISPERIDONE 1 MG PO TABS
2.0000 mg | ORAL_TABLET | Freq: Two times a day (BID) | ORAL | Status: DC
  Administered 2018-06-01 – 2018-06-08 (×12): 2 mg via ORAL
  Filled 2018-06-01 (×17): qty 2

## 2018-06-01 MED ORDER — PANTOPRAZOLE SODIUM 40 MG PO TBEC
40.0000 mg | DELAYED_RELEASE_TABLET | Freq: Every day | ORAL | Status: DC
  Administered 2018-06-02 – 2018-06-06 (×5): 40 mg via ORAL
  Filled 2018-06-01 (×5): qty 1

## 2018-06-01 MED ORDER — GUAIFENESIN 100 MG/5ML PO SOLN
10.0000 mL | Freq: Four times a day (QID) | ORAL | Status: DC | PRN
  Filled 2018-06-01: qty 10

## 2018-06-01 MED ORDER — ONDANSETRON HCL 4 MG PO TABS: 4.0000 mg | ORAL_TABLET | Freq: Four times a day (QID) | ORAL | Status: DC | PRN

## 2018-06-01 NOTE — ED Provider Notes (Signed)
[facility] Emergency Department Provider Note       Time seen: ----------------------------------------- 4:00 PM on 06/01/2018 ----------------------------------------- Level 5 caveat: Review of systems and history limited by altered mental status  I have reviewed the triage vital signs and the nursing notes.  HISTORY   Chief Complaint No chief complaint on file.    HPI Anthony Chambers is a 73 y.o. male with a history of schizophrenia, depression, peripheral vascular disease, thrombosis in the left leg who presents to the ED for difficulty breathing and hypoxia.  Patient reportedly was wheezing and received duo nebs in route.  He does not normally wear oxygen, was brought in on 4 to 6 L of oxygen by nasal cannula.  He cannot give further review of systems or report.  No past medical history on file.  There are no active problems to display for this patient.  Allergies Patient has no allergy information on record.  Social History Social History   Tobacco Use  . Smoking status: Not on file  Substance Use Topics  . Alcohol use: Not on file  . Drug use: Not on file   Review of Systems Respiratory: Positive for shortness of breath  Review of systems and history otherwise unknown  ____________________________________________   PHYSICAL EXAM:  VITAL SIGNS: ED Triage Vitals  Enc Vitals Group     BP      Pulse      Resp      Temp      Temp src      SpO2      Weight      Height      Head Circumference      [facility] Flow      Pain Score      Pain Loc      Pain Edu?      Excl. in GC?    Constitutional: Drowsy but responds to verbal stimuli.  Chronically ill-appearing, mild distress ENT      Head: Normocephalic and atraumatic.      Nose: No congestion/rhinnorhea.      Mouth/Throat: Mucous membranes are moist.      Neck: No stridor. Cardiovascular: Normal rate, regular rhythm. No murmurs, rubs, or gallops. Respiratory: Normal respiratory  effort without tachypnea nor retractions. Breath sounds are clear and equal bilaterally. No wheezes/rales/rhonchi. Gastrointestinal: Soft and nontender. Normal bowel sounds Musculoskeletal: Chronic appearing bilateral lower extremity edema with distal tibia erythema Neurologic: Generalized weakness and lethargy, moves all of his extremities Skin: Some erythema is appreciated on his lower extremities bilaterally Psychiatric: Flat affect ____________________________________________  EKG: Interpreted by me.  Sinus rhythm with rate of 60 bpm, right bundle branch block, old inferior infarct, normal QT  ____________________________________________  ED COURSE:  As part of my medical decision making, I reviewed the following data within the electronic MEDICAL RECORD NUMBER History obtained from family if available, nursing notes, old chart and ekg, as well as notes from prior ED visits. Patient presented for altered mental status and hypoxia, we will assess with labs and imaging as indicated at this time.   Procedures  Anthony Chambers was evaluated in Emergency Department on 06/01/2018 for the symptoms described in the history of present illness. He was evaluated in the context of the global COVID-19 pandemic, which necessitated consideration that the patient might be at risk for infection with the SARS-CoV-2 virus that causes COVID-19. Institutional protocols and algorithms that pertain to the evaluation of patients at risk for COVID-19 are in  a state of rapid change based on information released by regulatory bodies including the CDC and federal and state organizations. These policies and algorithms were followed during the patient's care in the ED.  ____________________________________________   LABS (pertinent positives/negatives)  Labs Reviewed  CBC WITH DIFFERENTIAL/PLATELET - Abnormal; Notable for the following components:      Result Value   Hemoglobin 12.6 (*)    MCH 25.3 (*)    MCHC 29.2 (*)     Abs Immature Granulocytes 0.08 (*)    All other components within normal limits  BASIC METABOLIC PANEL - Abnormal; Notable for the following components:   CO2 37 (*)    Glucose, Bld 160 (*)    Calcium 8.3 (*)    All other components within normal limits  BRAIN NATRIURETIC PEPTIDE - Abnormal; Notable for the following components:   B Natriuretic Peptide 815.0 (*)    All other components within normal limits  BLOOD GAS, VENOUS - Abnormal; Notable for the following components:   pH, Ven 7.24 (*)    pCO2, Ven 90 (*)    pO2, Ven <31.0 (*)    Bicarbonate 38.6 (*)    Acid-Base Excess 7.7 (*)    All other components within normal limits  CULTURE, BLOOD (ROUTINE X 2)  CULTURE, BLOOD (ROUTINE X 2)  SARS CORONAVIRUS 2 (HOSPITAL ORDER, PERFORMED IN [facility])  LACTIC ACID, PLASMA    RADIOLOGY Images were viewed by me  Chest x-ray IMPRESSION: 1. Cardiomegaly. 2. Coarse interstitial markings bilaterally, compatible with CHF and/or chronic interstitial lung disease. 3. No evidence of consolidating pneumonia seen.  ____________________________________________   DIFFERENTIAL DIAGNOSIS   Pneumonia, PE, coronavirus, dehydration, electrolyte abnormality, sepsis  FINAL ASSESSMENT AND PLAN  Hypoxia, acute hypercarbic respiratory failure, CHF   Plan: The patient had presented for difficulty breathing and hypoxia. Patient's labs did reveal acute hypercarbia and elevated BNP. Patient's imaging revealed coarse interstitial markings compatible with CHF.  We have placed him on BiPAP for his hypercarbia and have ordered IV Lasix.  I will discuss with the hospitalist for admission.   Ulice DashJohnathan E Williams, MD    Note: This note was generated in part or whole with voice recognition software. Voice recognition is usually quite accurate but there are transcription errors that can and very often do occur. I apologize for any typographical errors that were not detected and corrected.      Emily FilbertWilliams, Jonathan E, MD 06/01/18 1723

## 2018-06-01 NOTE — ED Notes (Signed)
VBG results given to Dr Mayford Knife

## 2018-06-01 NOTE — ED Notes (Signed)
ED TO INPATIENT HANDOFF REPORT  ED Nurse Name and Phone #: Shanda BumpsJessica [phone]   S Name/Age/Gender Haynes Bastanny C Rashad 73 y.o. male Room/Bed: ED02A/ED02A  Code Status   Code Status: Not on file  Home/SNF/Other Nursing Home Patient oriented to: self, place, time and situation Is this baseline? Yes   Triage Complete: Triage complete  Chief Complaint breathing difficulty  Triage Note [facility] from [facility]. Per EMS, patient has had worsening SOB since yesterday. Patient was tested for COVID-19 on 5/13 with a negative result. Patient had room air sat of 64%. Patient placed on 6L [facility] with oxygenation of 100%. Oxygen decreased to 4L [place]. Patient with history of dementia. Oriented at baseline.    Allergies Allergies  Allergen Reactions  . Heparin     Level of Care/Admitting Diagnosis ED Disposition    ED Disposition Condition Comment   Admit  [facility]ospital [facility] [[id number]]  Level of Care: Telemetry [5]  Covid Evaluation: Screening Protocol (No Symptoms)  Diagnosis: Acute respiratory failure with hypoxia and hypercapnia [facility](HCC) [[id number]]  Admitting Physician: [place] SirenSAINANI, VIVEK J [[id number]]  Attending Physician: Houston SirenSAINANI, VIVEK J [[id number]]  Estimated length of stay: past midnight tomorrow  Certification:: I certify this patient will need inpatient services for at least 2 midnights  PT Class (Do Not Modify): Inpatient [101]  PT Acc Code (Do Not Modify): Private [1]       B Medical/Surgery History Past Medical History:  Diagnosis Date  . Depression   . GERD (gastroesophageal reflux disease)   . Hyperlipidemia   . Hypertension   . Schizophrenia (HCC)   . Total occlusion of coronary artery, chronic       A IV Location/Drains/Wounds Patient Lines/Drains/Airways Status   Active Line/Drains/Airways    Name:   Placement date:   Placement time:   Site:   Days:   Peripheral IV 06/01/18 Left Forearm   06/01/18    1615    Forearm   less than 1   Peripheral IV  06/01/18 Left Hand   06/01/18    1616    Hand   less than 1   Peripheral IV 06/01/18 Right Antecubital   06/01/18    1617    Antecubital   less than 1          Intake/Output Last 24 hours  Intake/Output Summary (Last 24 hours) at 06/01/2018 1851 Last data filed at 06/01/2018 1827 [place] per 24 hour  Intake -  Output 150 ml  Net -150 ml    Labs/Imaging Results for orders placed or performed during the hospital encounter of 06/01/18 (from the past 48 hour(s))  CBC with Differential     Status: Abnormal   Collection Time: 06/01/18  4:11 PM  Result Value Ref Range   WBC 6.0 4.0 - 10.5 K/uL   RBC 4.98 4.22 - 5.81 MIL/uL   Hemoglobin 12.6 (L) 13.0 - 17.0 g/dL   HCT 52.843.2 41.339.0 - 24.452.0 %   MCV 86.7 80.0 - 100.0 fL   MCH 25.3 (L) 26.0 - 34.0 pg   MCHC 29.2 (L) 30.0 - 36.0 g/dL   RDW 01.014.8 27.211.5 - 53.615.5 %   Platelets 170 150 - 400 K/uL   nRBC 0.0 0.0 - 0.2 %   Neutrophils Relative % 72 %   Neutro Abs 4.3 1.7 - 7.7 K/uL   Lymphocytes Relative 16 %   Lymphs Abs 0.9 0.7 - 4.0 K/uL   Monocytes Relative 8 %   Monocytes Absolute 0.5  0.1 - 1.0 K/uL   Eosinophils Relative 2 %   Eosinophils Absolute 0.1 0.0 - 0.5 K/uL   Basophils Relative 1 %   Basophils Absolute 0.0 0.0 - 0.1 K/uL   Immature Granulocytes 1 %   Abs Immature Granulocytes 0.08 (H) 0.00 - 0.07 K/uL    Comment: Performed at [facility], [address], [place], [place] [address]  Basic metabolic panel     Status: Abnormal   Collection Time: 06/01/18  4:11 PM  Result Value Ref Range   Sodium 142 135 - 145 mmol/L   Potassium 4.4 3.5 - 5.1 mmol/L   Chloride 100 98 - 111 mmol/L   CO2 37 (H) 22 - 32 mmol/L   Glucose, Bld 160 (H) 70 - 99 mg/dL   BUN 21 8 - 23 mg/dL   Creatinine, Ser 1.19 0.61 - 1.24 mg/dL   Calcium 8.3 (L) 8.9 - 10.3 mg/dL   GFR calc non Af Amer >60 >60 mL/min   GFR calc Af Amer >60 >60 mL/min   Anion gap 5 5 - 15    Comment: Performed at [facility], [address] [place], [place]  [address]  Brain natriuretic peptide     Status: Abnormal   Collection Time: 06/01/18  4:11 PM  Result Value Ref Range   B Natriuretic Peptide 815.0 (H) 0.0 - 100.0 pg/mL    Comment: Performed at [facility], [address] [place], [place] [address]  Blood gas, venous     Status: Abnormal   Collection Time: 06/01/18  4:11 PM  Result Value Ref Range   pH, Ven 7.24 (L) 7.250 - 7.430   pCO2, Ven 90 (HH) 44.0 - 60.0 mmHg    Comment: CRITICAL RESULT CALLED TO, READ BACK BY AND VERIFIED WITH:  BRANDY RN AT 1640, 06/01/2018, LS    pO2, Ven <31.0 (LL) 32.0 - 45.0 mmHg    Comment: CRITICAL RESULT CALLED TO, READ BACK BY AND VERIFIED WITH:  BRANDY RN AT 1640, 06/01/2018, LS    Bicarbonate 38.6 (H) 20.0 - 28.0 mmol/L   Acid-Base Excess 7.7 (H) 0.0 - 2.0 mmol/L   O2 Saturation 31.5 %   Patient temperature 37.0    Collection site VENOUS    Sample type VENOUS    Mechanical Rate VENOUS     Comment: Performed at [facility], [address] [place], [place] [address]  Lactic acid, plasma     Status: None   Collection Time: 06/01/18  4:11 PM  Result Value Ref Range   Lactic Acid, Venous 1.2 0.5 - 1.9 mmol/L    Comment: Performed at [facility] [address], [place], [place] [address]  SARS Coronavirus 2 (CEPHEID- Performed in [facility] hospital lab), Hosp Order     Status: None   Collection Time: 06/01/18  4:11 PM  Result Value Ref Range   SARS Coronavirus 2 NEGATIVE NEGATIVE    Comment: (NOTE) If result is NEGATIVE SARS-CoV-2 target nucleic acids are NOT DETECTED. The SARS-CoV-2 RNA is generally detectable in upper and lower  respiratory specimens during the acute phase of infection. The lowest  concentration of SARS-CoV-2 viral copies this assay can detect is 250  copies / mL. A negative result does not preclude SARS-CoV-2 infection  and should not be used as the sole basis for treatment or other  patient management decisions.  A negative result may occur  with  improper specimen collection / handling, submission of specimen other  than nasopharyngeal swab, presence of viral mutation(s) within the  areas targeted by this assay, and inadequate number of viral copies  (<250 copies / mL). A negative result must be combined with clinical  observations, patient history, and epidemiological information. If result is POSITIVE SARS-CoV-2 target nucleic acids are DETECTED. The SARS-CoV-2 RNA is generally detectable in upper and lower  respiratory specimens dur ing the acute phase of infection.  Positive  results are indicative of active infection with SARS-CoV-2.  Clinical  correlation with patient history and other diagnostic information is  necessary to determine patient infection status.  Positive results do  not rule out bacterial infection or co-infection with other viruses. If result is PRESUMPTIVE POSTIVE SARS-CoV-2 nucleic acids MAY BE PRESENT.   A presumptive positive result was obtained on the submitted specimen  and confirmed on repeat testing.  While 2019 novel coronavirus  (SARS-CoV-2) nucleic acids may be present in the submitted sample  additional confirmatory testing may be necessary for epidemiological  and / or clinical management purposes  to differentiate between  SARS-CoV-2 and other Sarbecovirus currently known to infect humans.  If clinically indicated additional testing with an alternate test  methodology [phone]) is advised. The SARS-CoV-2 RNA is generally  detectable in upper and lower respiratory sp ecimens during the acute  phase of infection. The expected result is Negative. Fact Sheet for Patients:  BoilerBrush.com.cy Fact Sheet for Healthcare Providers: https://pope.com/ This test is not yet approved or cleared by the [place] FDA and has been authorized for detection and/or diagnosis of SARS-CoV-2 by FDA under an Emergency Use Authorization (EUA).  This EUA  will remain in effect (meaning this test can be used) for the duration of the COVID-19 declaration under Section 564(b)(1) of the Act, 21 U.S.C. section 360bbb-3(b)(1), unless the authorization is terminated or revoked sooner. Performed at [facility], [address] [place], [place] [address]    Dg Chest [place] 1 View  Result Date: 06/01/2018 CLINICAL DATA:  Worsening shortness of breath since yesterday EXAM: PORTABLE CHEST 1 VIEW COMPARISON:  None. FINDINGS: Cardiomegaly. Coarse interstitial markings bilaterally indicating interstitial edema or chronic interstitial lung disease. No confluent opacity to suggest a consolidating pneumonia. No pleural effusion or pneumothorax seen. Elevation of the RIGHT hemidiaphragm, of uncertain chronicity. Osseous structures about the chest are unremarkable. IMPRESSION: 1. Cardiomegaly. 2. Coarse interstitial markings bilaterally, compatible with CHF and/or chronic interstitial lung disease. 3. No evidence of consolidating pneumonia seen. Electronically Signed   By: Bary Richard M.D.   On: 06/01/2018 16:25    Pending Labs Unresulted Labs (From admission, onward)    Start     Ordered   06/01/18 1600  Blood culture (routine x 2)  BLOOD CULTURE X 2,   STAT    Question:  Patient immune status  Answer:  Normal   06/01/18 1559   Signed and Held  Basic metabolic panel  Tomorrow morning,   R     Signed and Held   Signed and Held  CBC  Tomorrow morning,   R     Signed and Held          Vitals/Pain Today's Vitals   06/01/18 1645 06/01/18 1700 06/01/18 1715 06/01/18 1730  BP:  133/62  125/64  Pulse: (!) 58 (!) 56 (!) 50 (!) 56  Resp: 17 17 (!) 28 (!) 23  Temp:      TempSrc:      SpO2: 98% 98% 100% 96%  Weight:  Height:        Isolation Precautions Droplet and Contact precautions  Medications Medications  furosemide (LASIX) injection 20 mg (20 mg Intravenous Given 06/01/18 1736)    Mobility non-ambulatory High fall risk    Focused Assessments 1   R Recommendations: See Admitting Provider Note  Report given to:   Additional Notes:

## 2018-06-01 NOTE — H&P (Signed)
[facility] - [place] at [facility]    PATIENT NAME: Anthony EdwardsDanny Chambers    MR#:  [id number]  DATE OF BIRTH:  1945-10-09  DATE OF ADMISSION:  06/01/2018  PRIMARY CARE PHYSICIAN: Dorothey BasemanBronstein, David, MD   REQUESTING/REFERRING PHYSICIAN: Dr. Daryel NovemberJonathan Williams  CHIEF COMPLAINT:   Chief Complaint  Patient presents with  . Shortness of Breath    HISTORY OF PRESENT ILLNESS:  Anthony EdwardsDanny Landenberger  is a 73 y.o. male with a known history of hypertension, hyperlipidemia, GERD, depression, schizophrenia who presents to the hospital due to shortness of breath and noted to be in acute respiratory failure with hypoxia and hypercapnia.  Patient has underlying dementia/schizophrenia therefore most of the history obtained from the ER physician and from the chart.  Patient apparently was at his skilled nursing facility was noted to be hypoxic with room air O2 sats in the mid to high 60s.  He had been complaining of shortness of breath for the past few days and had a COVID-19 test done on 13 May which was negative.  Given his hypoxemia he was sent back to the ER for further evaluation.  Patient underwent an VBG while in the ER and was noted to be in hypercapnic respiratory failure with PCO2 greater than 90.  He did not appear to be in any worsening respiratory distress.  Patient's chest x-ray findings are suggestive of mild pulmonary edema and CHF and patient has no previous history of it.  Hospitalist services were contacted for admission.  Patient has had no fever, chills nausea vomiting chest pain or any other associated symptoms.  His repeat COVID-19 test is negative.  PAST MEDICAL HISTORY:   Past Medical History:  Diagnosis Date  . Depression   . GERD (gastroesophageal reflux disease)   . Hyperlipidemia   . Hypertension   . Schizophrenia (HCC)   . Total occlusion of coronary artery, chronic     PAST SURGICAL HISTORY:   None  SOCIAL HISTORY:   Social History   Tobacco Use  . Smoking  status: Former Smoker    Years: 40.00    Types: Cigarettes  Substance Use Topics  . Alcohol use: Not Currently    FAMILY HISTORY:   Family History  Problem Relation Age of Onset  . Asthma Mother     DRUG ALLERGIES:   Allergies  Allergen Reactions  . Heparin     REVIEW OF SYSTEMS:   Review of Systems  Unable to perform ROS: Mental acuity    MEDICATIONS AT HOME:   Prior to Admission medications   Medication Sig Start Date End Date Taking? Authorizing Provider  acetaminophen (TYLENOL) 500 MG tablet Take 1,000 mg by mouth at bedtime.   Yes [provider]  guaifenesin (ROBITUSSIN) 100 MG/5ML syrup Take 10 mLs by mouth every 6 (six) hours as needed for cough.   Yes [provider]  metoprolol succinate (TOPROL-XL) 25 MG 24 hr tablet Take 25 mg by mouth daily.   Yes [provider]  OLANZapine (ZYPREXA) 15 MG tablet Take 30 mg by mouth daily.   Yes [provider]  omeprazole (PRILOSEC) 20 MG capsule Take 20 mg by mouth daily.   Yes [provider]  PARoxetine (PAXIL-CR) 25 MG 24 hr tablet Take 25 mg by mouth daily.   Yes [provider]  risperiDONE (RISPERDAL) 2 MG tablet Take 2 mg by mouth 2 (two) times daily.   Yes [provider]  rivaroxaban (XARELTO) 20 MG TABS tablet Take 20  mg by mouth daily.   Yes [provider]  simvastatin (ZOCOR) 20 MG tablet Take 20 mg by mouth at bedtime.   Yes [provider]  triamcinolone cream (KENALOG) 0.5 % Apply 1 application topically 2 (two) times daily.   Yes [provider]  vitamin B-12 (CYANOCOBALAMIN) 1000 MCG tablet Take 1,000 mcg by mouth daily.   Yes [provider]      VITAL SIGNS:  Blood pressure 125/64, pulse (!) 56, temperature 98.5 F (36.9 C), temperature source Axillary, resp. rate (!) 23, height  (1.854 m), weight 122.7 kg, SpO2 96 %.  PHYSICAL EXAMINATION:  Physical Exam  GENERAL:  73 y.o.-year-old patient  lying in the bed on bipap but in NAD.  EYES: Pupils equal, round, reactive to light and accommodation. No scleral icterus. Extraocular muscles intact.  HEENT: Head atraumatic, normocephalic. Oropharynx and nasopharynx clear. No oropharyngeal erythema, moist oral mucosa  NECK:  Supple, no jugular venous distention. No thyroid enlargement, no tenderness.  LUNGS: Good A/E b/l, no wheezing, bibasilar rales, No rhonchi. No use of accessory muscles of respiration.  CARDIOVASCULAR: S1, S2 RRR. No murmurs, rubs, gallops, clicks.  ABDOMEN: Soft, nontender, nondistended. Bowel sounds present. No organomegaly or mass.  EXTREMITIES: + 1 edema b/l, cyanosis, or clubbing. + 2 pedal & radial pulses b/l.  Signs of chronic venous stasis bilaterally. NEUROLOGIC: Cranial nerves II through XII are intact. No focal Motor or sensory deficits appreciated b/l. Globally weak.  PSYCHIATRIC: The patient is alert and oriented x 1.  SKIN: No obvious rash, lesion, or ulcer.   LABORATORY PANEL:   CBC Recent Labs  Lab 06/01/18 1611  WBC 6.0  HGB 12.6*  HCT 43.2  PLT 170   ------------------------------------------------------------------------------------------------------------------  Chemistries  Recent Labs  Lab 06/01/18 1611  NA 142  K 4.4  CL 100  CO2 37*  GLUCOSE 160*  BUN 21  CREATININE 0.94  CALCIUM 8.3*   ------------------------------------------------------------------------------------------------------------------  Cardiac Enzymes No results for input(s): TROPONINI in the last 168 hours. ------------------------------------------------------------------------------------------------------------------  RADIOLOGY:  Dg Chest Port 1 View  Result Date: 06/01/2018 CLINICAL DATA:  Worsening shortness of breath since yesterday EXAM: PORTABLE CHEST 1 VIEW COMPARISON:  None. FINDINGS: Cardiomegaly. Coarse interstitial markings bilaterally indicating interstitial edema or chronic interstitial lung  disease. No confluent opacity to suggest a consolidating pneumonia. No pleural effusion or pneumothorax seen. Elevation of the RIGHT hemidiaphragm, of uncertain chronicity. Osseous structures about the chest are unremarkable. IMPRESSION: 1. Cardiomegaly. 2. Coarse interstitial markings bilaterally, compatible with CHF and/or chronic interstitial lung disease. 3. No evidence of consolidating pneumonia seen. Electronically Signed   By: Bary Richard M.D.   On: 06/01/2018 16:25     IMPRESSION AND PLAN:   73 y.o. male with a known history of hypertension, hyperlipidemia, GERD, depression, schizophrenia who presents to the hospital due to shortness of breath and noted to be in acute respiratory failure with hypoxia and hypercapnia.  1.  Acute respiratory failure with hypoxia and hypercapnia- suspected to be secondary to underlying CHF with underlying COPD. - No evidence of acute COPD exacerbation though.  Will treat underlying CHF with mild diuresis. -In the ER patient was placed on BiPAP but has improved and will remove BiPAP prior to admission. - Wean oxygen as tolerated.  2.  CHF- patient has no previous history of CHF.  He does have a history of atrial fibrillation. - We will diurese the patient gently with IV Lasix, follow I's and O's and daily weights.  Check  an echocardiogram.  Previous echo in 2013 showed normal ejection fraction. -Continue Toprol.  3.  History of atrial fibrillation-rate controlled.  Continue Toprol, continue Xarelto.  4.  Hyperlipidemia-continue Zocor.  5.  History of schizophrenia/depression-continue Risperdal, Zyprexa.  6.  GERD-continue Protonix.    All the records are reviewed and case discussed with ED provider. Management plans discussed with the patient, family and they are in agreement.  CODE STATUS: Full code  TOTAL TIME TAKING CARE OF THIS PATIENT: 40 minutes.    Houston Siren M.D on 06/01/2018 at 6:28 PM  Between 7am to 6pm - Pager -  [phone]  After 6pm go to [email or web address] - password [email or web address] [facility]  [facility]  Office  [phone]  CC: Primary care physician; Dorothey Baseman, MD

## 2018-06-01 NOTE — Progress Notes (Signed)
Informed Dr Mayford Knife patient is on 3 liters via nasal cannula. He was off bipap per his doings, and sitting at end of bed. He wet bed and wanted to be changed. His breathing was good and was not distressed. Stated ok to dc bipap.

## 2018-06-01 NOTE — ED Triage Notes (Signed)
Patient from [facility] via [facility]. Per EMS, patient has had worsening SOB since yesterday. Patient was tested for COVID-19 on 5/13 with a negative result. Patient had room air sat of 64%. Patient placed on 6L [facility] with oxygenation of 100%. Oxygen decreased to 4L [place]. Patient with history of dementia. Oriented at baseline.

## 2018-06-01 NOTE — ED Notes (Signed)
Per [facility] RT, pt was sitting on the end of the bed and urinating on bed. Pt was placed on 3L nasal canula by RT.

## 2018-06-02 ENCOUNTER — Inpatient Hospital Stay
Admit: 2018-06-02 | Discharge: 2018-06-02 | Disposition: A | Discharge status: Home / Self Care | Payer: Medicare Other | Attending: Specialist | Admitting: Specialist

## 2018-06-02 DIAGNOSIS — J9601 Acute respiratory failure with hypoxia: Secondary | ICD-10-CM

## 2018-06-02 LAB — BLOOD GAS, ARTERIAL
Acid-Base Excess: 13.1 mmol/L — ABNORMAL HIGH (ref 0.0–2.0)
Bicarbonate: 41.2 mmol/L — ABNORMAL HIGH (ref 20.0–28.0)
FIO2: 0.28
O2 Saturation: 91.5 %
Patient temperature: 37
pCO2 arterial: 68 mmHg (ref 32.0–48.0)
pH, Arterial: 7.39 (ref 7.350–7.450)
pO2, Arterial: 63 mmHg — ABNORMAL LOW (ref 83.0–108.0)

## 2018-06-02 LAB — BLOOD CULTURE ID PANEL (REFLEXED)

## 2018-06-02 LAB — BASIC METABOLIC PANEL
Anion gap: 9 (ref 5–15)
BUN: 20 mg/dL (ref 8–23)
CO2: 37 mmol/L — ABNORMAL HIGH (ref 22–32)
Calcium: 8.2 mg/dL — ABNORMAL LOW (ref 8.9–10.3)
Chloride: 97 mmol/L — ABNORMAL LOW (ref 98–111)
Creatinine, Ser: 0.88 mg/dL (ref 0.61–1.24)
GFR calc Af Amer: >60 mL/min (ref >60)
GFR calc non Af Amer: >60 mL/min (ref >60)
Glucose, Bld: 94 mg/dL (ref 70–99)
Potassium: 3.7 mmol/L (ref 3.5–5.1)
Sodium: 143 mmol/L (ref 135–145)

## 2018-06-02 LAB — ECHOCARDIOGRAM COMPLETE
Height: 75 in
Weight: 4044.8 oz

## 2018-06-02 LAB — CBC
HCT: 43.7 % (ref 39.0–52.0)
Hemoglobin: 12.9 g/dL — ABNORMAL LOW (ref 13.0–17.0)
MCH: 25 pg — ABNORMAL LOW (ref 26.0–34.0)
MCHC: 29.5 g/dL — ABNORMAL LOW (ref 30.0–36.0)
MCV: 84.9 fL (ref 80.0–100.0)
Platelets: 184 10*3/uL (ref 150–400)
RBC: 5.15 MIL/uL (ref 4.22–5.81)
RDW: 14.7 % (ref 11.5–15.5)
WBC: 5.3 10*3/uL (ref 4.0–10.5)
nRBC: 0 % (ref 0.0–0.2)

## 2018-06-02 LAB — MRSA PCR SCREENING: MRSA by PCR: NEGATIVE

## 2018-06-02 MED ORDER — IPRATROPIUM-ALBUTEROL 0.5-2.5 (3) MG/3ML IN SOLN
3.0000 mL | RESPIRATORY_TRACT | Status: DC
  Administered 2018-06-02 – 2018-06-05 (×18): 3 mL via RESPIRATORY_TRACT
  Filled 2018-06-02 (×17): qty 3

## 2018-06-02 MED ORDER — ORAL CARE MOUTH RINSE
15.0000 mL | Freq: Two times a day (BID) | OROMUCOSAL | Status: DC
  Administered 2018-06-02 – 2018-06-08 (×11): 15 mL via OROMUCOSAL

## 2018-06-02 MED ORDER — PERFLUTREN LIPID MICROSPHERE
1.0000 mL | INTRAVENOUS | Status: AC | PRN
  Administered 2018-06-02: 10:00:00 3.5 mL via INTRAVENOUS
  Filled 2018-06-02: qty 10

## 2018-06-02 NOTE — Progress Notes (Signed)
[facility] - [place] at [facility]                                                                                                                                                                                  Patient Demographics   Anthony Chambers, is a 73 y.o. male, DOB - Apr 27, 1945, ZOX:[id number]  Admit date - 06/01/2018   Admitting Physician Houston Siren, MD  Outpatient Primary MD for the patient is Dorothey Baseman, MD   LOS - 1  Subjective: Patient admitted with acute respiratory failure with hypoxia and hypercapnia Initially he was very sleepy but subsequently was able to wake up and answer some questions is hard to understand him.   Review of Systems:   CONSTITUTIONAL: No documented fever. No fatigue, weakness. No weight gain, no weight loss.  EYES: No blurry or double vision.  ENT: No tinnitus. No postnasal drip. No redness of the oropharynx.  RESPIRATORY: No cough, no wheeze, no hemoptysis.  Positive dyspnea.  CARDIOVASCULAR: No chest pain. No orthopnea. No palpitations. No syncope.  GASTROINTESTINAL: No nausea, no vomiting or diarrhea. No abdominal pain. No melena or hematochezia.  GENITOURINARY: No dysuria or hematuria.  ENDOCRINE: No polyuria or nocturia. No heat or cold intolerance.  HEMATOLOGY: No anemia. No bruising. No bleeding.  INTEGUMENTARY: No rashes. No lesions.  MUSCULOSKELETAL: No arthritis. No swelling. No gout.  NEUROLOGIC: No numbness, tingling, or ataxia. No seizure-type activity.  PSYCHIATRIC: No anxiety. No insomnia. No ADD.    Vitals:   Vitals:   06/01/18 1845 06/01/18 2040 06/02/18 0436 06/02/18 0738  BP:  (!) 168/102 (!) 148/76 (!) 173/88  Pulse: (!) 58 (!) 52 (!) 52 (!) 53  Resp:  Temp:  98.3 F (36.8 C) 98.2 F (36.8 C) 97.7 F (36.5 C)  TempSrc:  Oral Oral Oral  SpO2: 96% 92% 90% 92%  Weight:  116.5 kg 114.7 kg   Height:   (1.905 m)      Wt Readings from Last 3 Encounters:  06/02/18 114.7 kg      Intake/Output Summary (Last 24 hours) at 06/02/2018 1128 Last data filed at 06/02/2018 0440 Gross per 24 hour  Intake -  Output 2300 ml  Net -2300 ml    Physical Exam:   GENERAL: Pleasant-appearing in no apparent distress.  HEAD, EYES, EARS, NOSE AND THROAT: Atraumatic, normocephalic. Extraocular muscles are intact. Pupils equal and reactive to light. Sclerae anicteric. No conjunctival injection. No oro-pharyngeal erythema.  NECK: Supple. There is no jugular venous distention. No bruits, no lymphadenopathy, no thyromegaly.  HEART: Regular rate and rhythm,. No murmurs, no rubs, no clicks.  LUNGS: Clear to auscultation  bilaterally. No rales or rhonchi. No wheezes.  ABDOMEN: Soft, flat, nontender, nondistended. Has good bowel sounds. No hepatosplenomegaly appreciated.  EXTREMITIES: No evidence of any cyanosis, clubbing, or peripheral edema.  +2 pedal and radial pulses bilaterally.  NEUROLOGIC: The patient is alert, awake, and oriented x3 with no focal motor or sensory deficits appreciated bilaterally.  SKIN: Moist and warm with no rashes appreciated.  Psych: Not anxious, depressed LN: No inguinal LN enlargement    Antibiotics   Anti-infectives (From admission, onward)   None      Medications   Scheduled Meds: . furosemide  20 mg Intravenous Q12H  . mouth rinse  15 mL Mouth Rinse BID  . metoprolol succinate  25 mg Oral Daily  . OLANZapine  30 mg Oral Daily  . pantoprazole  40 mg Oral Daily  . PARoxetine  25 mg Oral Daily  . risperiDONE  2 mg Oral BID  . rivaroxaban  20 mg Oral Daily  . simvastatin  20 mg Oral QHS  . vitamin B-12  1,000 mcg Oral Daily   Continuous Infusions: PRN Meds:.acetaminophen **OR** acetaminophen, guaiFENesin, ondansetron **OR** ondansetron (ZOFRAN) IV   Data Review:   Micro Results Recent Results (from the past 240 hour(s))  Blood culture (routine x 2)     Status: None (Preliminary result)   Collection Time: 06/01/18  4:11 PM  Result  Value Ref Range Status   Specimen Description BLOOD BLOOD LEFT FOREARM  Final   Special Requests   Final    BOTTLES DRAWN AEROBIC AND ANAEROBIC Blood Culture adequate volume   Culture   Final    NO GROWTH < 24 HOURS Performed at [facility], [address] [place], [place] [address]    Report Status PENDING  Incomplete  SARS Coronavirus 2 (CEPHEID- Performed in [facility] hospital lab), Hosp Order     Status: None   Collection Time: 06/01/18  4:11 PM  Result Value Ref Range Status   SARS Coronavirus 2 NEGATIVE NEGATIVE Final    Comment: (NOTE) If result is NEGATIVE SARS-CoV-2 target nucleic acids are NOT DETECTED. The SARS-CoV-2 RNA is generally detectable in upper and lower  respiratory specimens during the acute phase of infection. The lowest  concentration of SARS-CoV-2 viral copies this assay can detect is 250  copies / mL. A negative result does not preclude SARS-CoV-2 infection  and should not be used as the sole basis for treatment or other  patient management decisions.  A negative result may occur with  improper specimen collection / handling, submission of specimen other  than nasopharyngeal swab, presence of viral mutation(s) within the  areas targeted by this assay, and inadequate number of viral copies  (<250 copies / mL). A negative result must be combined with clinical  observations, patient history, and epidemiological information. If result is POSITIVE SARS-CoV-2 target nucleic acids are DETECTED. The SARS-CoV-2 RNA is generally detectable in upper and lower  respiratory specimens dur ing the acute phase of infection.  Positive  results are indicative of active infection with SARS-CoV-2.  Clinical  correlation with patient history and other diagnostic information is  necessary to determine patient infection status.  Positive results do  not rule out bacterial infection or co-infection with other viruses. If result is PRESUMPTIVE POSTIVE SARS-CoV-2  nucleic acids MAY BE PRESENT.   A presumptive positive result was obtained on the submitted specimen  and confirmed on repeat testing.  While 2019 novel coronavirus  (SARS-CoV-2) nucleic acids may be present in  the submitted sample  additional confirmatory testing may be necessary for epidemiological  and / or clinical management purposes  to differentiate between  SARS-CoV-2 and other Sarbecovirus currently known to infect humans.  If clinically indicated additional testing with an alternate test  methodology [phone]) is advised. The SARS-CoV-2 RNA is generally  detectable in upper and lower respiratory sp ecimens during the acute  phase of infection. The expected result is Negative. Fact Sheet for Patients:  BoilerBrush.com.cy Fact Sheet for Healthcare Providers: https://pope.com/ This test is not yet approved or cleared by the [place] FDA and has been authorized for detection and/or diagnosis of SARS-CoV-2 by FDA under an Emergency Use Authorization (EUA).  This EUA will remain in effect (meaning this test can be used) for the duration of the COVID-19 declaration under Section 564(b)(1) of the Act, 21 U.S.C. section 360bbb-3(b)(1), unless the authorization is terminated or revoked sooner. Performed at [facility], [address] [place], [place] [address]   Blood culture (routine x 2)     Status: None (Preliminary result)   Collection Time: 06/01/18  4:19 PM  Result Value Ref Range Status   Specimen Description BLOOD BLOOD LEFT HAND  Final   Special Requests   Final    BOTTLES DRAWN AEROBIC AND ANAEROBIC Blood Culture adequate volume   Culture   Final    NO GROWTH < 24 HOURS Performed at [facility], [address], [place], [place] [address]    Report Status PENDING  Incomplete  MRSA PCR Screening     Status: None   Collection Time: 06/02/18  6:21 AM  Result Value Ref Range Status   MRSA by PCR  NEGATIVE NEGATIVE Final    Comment:        The GeneXpert MRSA Assay (FDA approved for NASAL specimens only), is one component of a comprehensive MRSA colonization surveillance program. It is not intended to diagnose MRSA infection nor to guide or monitor treatment for MRSA infections. Performed at [facility], [address] [place], [place] [address]     Radiology Reports Dg Chest [place] 1 View  Result Date: 06/01/2018 CLINICAL DATA:  Worsening shortness of breath since yesterday EXAM: PORTABLE CHEST 1 VIEW COMPARISON:  None. FINDINGS: Cardiomegaly. Coarse interstitial markings bilaterally indicating interstitial edema or chronic interstitial lung disease. No confluent opacity to suggest a consolidating pneumonia. No pleural effusion or pneumothorax seen. Elevation of the RIGHT hemidiaphragm, of uncertain chronicity. Osseous structures about the chest are unremarkable. IMPRESSION: 1. Cardiomegaly. 2. Coarse interstitial markings bilaterally, compatible with CHF and/or chronic interstitial lung disease. 3. No evidence of consolidating pneumonia seen. Electronically Signed   By: Bary Richard M.D.   On: 06/01/2018 16:25     CBC Recent Labs  Lab 06/01/18 1611 06/02/18 0508  WBC 6.0 5.3  HGB 12.6* 12.9*  HCT 43.2 43.7  PLT 170 184  MCV 86.7 84.9  MCH 25.3* 25.0*  MCHC 29.2* 29.5*  RDW 14.8 14.7  LYMPHSABS 0.9  --   MONOABS 0.5  --   EOSABS 0.1  --   BASOSABS 0.0  --     Chemistries  Recent Labs  Lab 06/01/18 1611 06/02/18 0508  NA 142 143  K 4.4 3.7  CL 100 97*  CO2 37* 37*  GLUCOSE 160* 94  BUN 21 20  CREATININE 0.94 0.88  CALCIUM 8.3* 8.2*   ------------------------------------------------------------------------------------------------------------------ estimated creatinine clearance is 105.2 mL/min (by C-G formula based on SCr of 0.88  mg/dL). ------------------------------------------------------------------------------------------------------------------ No results  for input(s): HGBA1C in the last 72 hours. ------------------------------------------------------------------------------------------------------------------ No results for input(s): CHOL, HDL, LDLCALC, TRIG, CHOLHDL, LDLDIRECT in the last 72 hours. ------------------------------------------------------------------------------------------------------------------ No results for input(s): TSH, T4TOTAL, T3FREE, THYROIDAB in the last 72 hours.  Invalid input(s): FREET3 ------------------------------------------------------------------------------------------------------------------ No results for input(s): VITAMINB12, FOLATE, FERRITIN, TIBC, IRON, RETICCTPCT in the last 72 hours.  Coagulation profile No results for input(s): INR, PROTIME in the last 168 hours.  No results for input(s): DDIMER in the last 72 hours.  Cardiac Enzymes No results for input(s): CKMB, TROPONINI, MYOGLOBIN in the last 168 hours.  Invalid input(s): CK ------------------------------------------------------------------------------------------------------------------ Invalid input(s): POCBNP    Assessment & Plan   73 y.o. male with a known history of hypertension, hyperlipidemia, GERD, depression, schizophrenia who presents to the hospital due to shortness of breath and noted to be in acute respiratory failure with hypoxia and hypercapnia.  1.  Acute respiratory failure with hypoxia and hypercapnia- suspected to be secondary to underlying CHF with underlying COPD. - No evidence of acute COPD exacerbation though.    Continue IV diuresis  - Wean oxygen as tolerated.  BiPAP at bedtime --I checked ABG shows likely chronic CO2 retention  2.  CHF- patient has no previous history of CHF.  He does have a history of atrial fibrillation. - Echo pending   3.  History of atrial  fibrillation-rate controlled.  Continue Toprol, continue Xarelto.  4.  Hyperlipidemia-continue Zocor.  5.  History of schizophrenia/depression-continue Risperdal, Zyprexa.  6.  GERD-continue Protonix.      Code Status Orders  (From admission, onward)         Start     Ordered   06/01/18 2046  Full code  Continuous     06/01/18 2046        Code Status History    This patient has a current code status but no historical code status.    Advance Directive Documentation     Most Recent Value  Type of Advance Directive  Out of facility DNR (pink MOST or yellow form)  Pre-existing out of facility DNR order (yellow form or pink MOST form)  Pink MOST form placed in chart (order not valid for inpatient use)  "MOST" Form in Place?  -           Consults none  DVT Prophylaxis  Lovenox  Lab Results  Component Value Date   PLT 184 06/02/2018     Time Spent in minutes    Greater than 50% of time spent in care coordination and counseling patient regarding the condition and plan of care.   Auburn Bilberry M.D on 06/02/2018 at 11:28 AM  Between 7am to 6pm - Pager - [phone]  After 6pm go to [email or web address] - Social research officer, government  [employer]   Office  [phone]

## 2018-06-02 NOTE — Progress Notes (Signed)
*  PRELIMINARY RESULTS* Echocardiogram 2D Echocardiogram has been performed. Definity IV Contrast used on this study.  Garrel Ridgel Tamiki Kuba 06/02/2018, 10:21 AM

## 2018-06-02 NOTE — Progress Notes (Signed)
PHARMACY - PHYSICIAN COMMUNICATION CRITICAL VALUE ALERT - BLOOD CULTURE IDENTIFICATION (BCID)  Anthony Chambers is an 73 y.o. male who presented to [facility] on 06/01/2018 with a chief complaint of Shortness of Breath  Assessment:  1/4 (aerobic bottle only) staph species - mec A NOT detected (include suspected source if known)  Name of physician (or Provider) Contacted: Auburn Bilberry  Current antibiotics: None  Changes to prescribed antibiotics recommended:  Likely contaminant - no antibiotics ordered at this time.  Results for orders placed or performed during the hospital encounter of 06/01/18  Blood Culture ID Panel (Reflexed) (Collected: 06/01/2018  4:11 PM)  Result Value Ref Range   Enterococcus species NOT DETECTED NOT DETECTED   Listeria monocytogenes NOT DETECTED NOT DETECTED   Staphylococcus species DETECTED (A) NOT DETECTED   Staphylococcus aureus (BCID) NOT DETECTED NOT DETECTED   Methicillin resistance NOT DETECTED NOT DETECTED   Streptococcus species NOT DETECTED NOT DETECTED   Streptococcus agalactiae NOT DETECTED NOT DETECTED   Streptococcus pneumoniae NOT DETECTED NOT DETECTED   Streptococcus pyogenes NOT DETECTED NOT DETECTED   Acinetobacter baumannii NOT DETECTED NOT DETECTED   Enterobacteriaceae species NOT DETECTED NOT DETECTED   Enterobacter cloacae complex NOT DETECTED NOT DETECTED   Escherichia coli NOT DETECTED NOT DETECTED   Klebsiella oxytoca NOT DETECTED NOT DETECTED   Klebsiella pneumoniae NOT DETECTED NOT DETECTED   Proteus species NOT DETECTED NOT DETECTED   Serratia marcescens NOT DETECTED NOT DETECTED   Haemophilus influenzae NOT DETECTED NOT DETECTED   Neisseria meningitidis NOT DETECTED NOT DETECTED   Pseudomonas aeruginosa NOT DETECTED NOT DETECTED   Candida albicans NOT DETECTED NOT DETECTED   Candida glabrata NOT DETECTED NOT DETECTED   Candida krusei NOT DETECTED NOT DETECTED   Candida parapsilosis NOT DETECTED NOT DETECTED   Candida  tropicalis NOT DETECTED NOT DETECTED    Mauri Reading, PharmD Pharmacy Resident  06/02/2018 3:28 PM

## 2018-06-02 NOTE — Progress Notes (Signed)
Pt got more confused around 1615. This RN came into the room doing hourly rounding and found that pt had taken out all 3 IVs he had in, all telemetry leads, and his O2. Pt only oriented to self at this time. This RN reoriented pt, cleaned him up, and restarted an IV. Will continue to monitor.

## 2018-06-03 ENCOUNTER — Encounter: Payer: Self-pay | Admitting: *Deleted

## 2018-06-03 LAB — BASIC METABOLIC PANEL
Anion gap: 8 (ref 5–15)
BUN: 22 mg/dL (ref 8–23)
CO2: 38 mmol/L — ABNORMAL HIGH (ref 22–32)
Calcium: 8.1 mg/dL — ABNORMAL LOW (ref 8.9–10.3)
Chloride: 98 mmol/L (ref 98–111)
Creatinine, Ser: 1.07 mg/dL (ref 0.61–1.24)
GFR calc Af Amer: >60 mL/min (ref >60)
GFR calc non Af Amer: >60 mL/min (ref >60)
Glucose, Bld: 97 mg/dL (ref 70–99)
Potassium: 4.1 mmol/L (ref 3.5–5.1)
Sodium: 144 mmol/L (ref 135–145)

## 2018-06-03 MED ORDER — FUROSEMIDE 10 MG/ML IJ SOLN
40.0000 mg | Freq: Two times a day (BID) | INTRAMUSCULAR | Status: DC
  Administered 2018-06-03 – 2018-06-05 (×4): 40 mg via INTRAVENOUS
  Filled 2018-06-03 (×4): qty 4

## 2018-06-03 MED ORDER — BUDESONIDE 0.25 MG/2ML IN SUSP
0.2500 mg | Freq: Two times a day (BID) | RESPIRATORY_TRACT | Status: DC
  Administered 2018-06-03 – 2018-06-07 (×9): 0.25 mg via RESPIRATORY_TRACT
  Filled 2018-06-03 (×9): qty 2

## 2018-06-03 MED ORDER — SENNOSIDES-DOCUSATE SODIUM 8.6-50 MG PO TABS
2.0000 | ORAL_TABLET | Freq: Two times a day (BID) | ORAL | Status: DC
  Administered 2018-06-03 – 2018-06-06 (×7): 2 via ORAL
  Filled 2018-06-03 (×8): qty 2

## 2018-06-03 NOTE — Plan of Care (Signed)
  Problem: Clinical Measurements: Goal: Respiratory complications will improve Outcome: Not Progressing  Pt requiring O2

## 2018-06-03 NOTE — TOC Initial Note (Signed)
Transition of Care [facility](TOC) - Initial/Assessment Note    Patient Details  Name: Anthony Chambers MRN: [id number] Date of Birth: 28-Mar-1945  Transition of Care [facility](TOC) CM/SW Contact:    Sherren KernsJennifer L Despina Boan, RN Phone Number: 06/03/2018, 12:09 PM  Clinical Narrative:  Patient is a LTC resident at [facility].  Spoke with Inetta Fermoina at [facility] and updated the HUB.  Also spoke with sister Sharma CovertSheila Ector [phone] . The plan is to return to [facility] when medically ready.  He is currently on 5L acute oxygen. He uses oxygen HS prn at [facility].  He receives all medications and medical care at [facility] without difficulty. Pending PT evaluation and will update HUB once complete.  Will continue to assist with discharge planning.                     Expected Discharge Plan: Skilled Nursing Facility Barriers to Discharge: Continued Medical Work up   Patient Goals and CMS Choice Patient states their goals for this hospitalization and ongoing recovery are:: to go back to [facility] CMS Medicare.gov Compare Post Acute Care list provided to:: Patient(sister sheila ) Choice offered to / list presented to : Sibling, Patient  Expected Discharge Plan and Services Expected Discharge Plan: Skilled Nursing Facility     Post Acute Care Choice: Skilled Nursing Facility                                        Prior Living Arrangements/Services     Patient language and need for interpreter reviewed:: Yes Do you feel safe going back to the place where you live?: Yes            Criminal Activity/Legal Involvement Pertinent to Current Situation/Hospitalization: No - Comment as needed  Activities of Daily Living Home Assistive Devices/Equipment: Environmental consultantWalker (specify type), Wheelchair, Medical laboratory scientific officerCane (specify quad or straight) ADL Screening (condition at time of admission) Patient's cognitive ability adequate to safely complete daily activities?: No Is the patient deaf or have difficulty hearing?: No Does the patient have difficulty  seeing, even when wearing glasses/contacts?: No Does the patient have difficulty concentrating, remembering, or making decisions?: No Patient able to express need for assistance with ADLs?: Yes Does the patient have difficulty dressing or bathing?: Yes Independently performs ADLs?: No Communication: Independent Dressing (OT): Needs assistance Is this a change from baseline?: Pre-admission baseline Grooming: Independent, Needs assistance Is this a change from baseline?: Pre-admission baseline Feeding: Independent Bathing: Needs assistance Is this a change from baseline?: Pre-admission baseline Toileting: Needs assistance Is this a change from baseline?: Pre-admission baseline In/Out Bed: Needs assistance Is this a change from baseline?: Pre-admission baseline Walks in Home: Needs assistance Is this a change from baseline?: Pre-admission baseline Does the patient have difficulty walking or climbing stairs?: Yes Weakness of Legs: Both Weakness of Arms/Hands: None  Permission Sought/Granted Permission sought to share information with : Facility Industrial/product designerContact Representative Permission granted to share information with : Yes, Verbal Permission Granted  Share Information with NAME: [facility]           Emotional Assessment Appearance:: Appears stated age Attitude/Demeanor/Rapport: Gracious Affect (typically observed): Accepting Orientation: : Oriented to Self, Oriented to  Time Alcohol / Substance Use: Not Applicable    Admission diagnosis:  Acute respiratory failure with hypoxia and hypercapnia (HCC) [J96.01, J96.02] Patient Active Problem List   Diagnosis Date Noted  . Acute respiratory failure with  hypoxia and hypercapnia (HCC) 06/01/2018   PCP:  Dorothey Baseman, MD Pharmacy:  No Pharmacies Listed    Social Determinants of Health (SDOH) Interventions    Readmission Risk Interventions No flowsheet data found.

## 2018-06-03 NOTE — Progress Notes (Signed)
Patient found with [place] laying in bed beside him. [place] placed back on patient. Breathing treatment given. CPAP placed on Patient and FIO2 increased to assist with decreased SAT. RN aware. Patient being placed on continuous pulse ox to monitor.

## 2018-06-03 NOTE — Plan of Care (Signed)
No c/o pain, good appetite, educated on 1200 fluid restriction, diet changed and daily weights ordered. Needs reinforcement. Condom cath on and in place, voiding well.   C/O constipation, MD notified, orders for PO senna, given to patient, see MAR.   Problem: Nutrition: Goal: Adequate nutrition will be maintained Outcome: Progressing   Problem: Elimination: Goal: Will not experience complications related to urinary retention Outcome: Progressing

## 2018-06-03 NOTE — NC FL2 (Signed)
[place] MEDICAID FL2 LEVEL OF CARE SCREENING TOOL     IDENTIFICATION  Patient Name: Anthony Chambers Birthdate: 1945-12-04 Sex: male Admission Date (Current Location): 06/01/2018  [place]   Facility and Address:  [facility], [address], [place], [place] [address]      Provider Number: [id number]  Attending Physician Name and Address:  Delfino LovettShah, Vipul, MD  Relative Name and Phone Number:  Eligah EastCTOR,SHEILA M  [phone]-Sister    Current Level of Care: Hospital Recommended Level of Care: Skilled Nursing Facility Prior Approval Number:    Date Approved/Denied:   PASRR Number: [id number]  Discharge Plan: SNF    Current Diagnoses: Patient Active Problem List   Diagnosis Date Noted  . Acute respiratory failure with hypoxia and hypercapnia (HCC) 06/01/2018    Orientation RESPIRATION BLADDER Height & Weight     Self, Place  O2(5L) Incontinent Weight: 114.5 kg Height:  6\' 3"  (190.5 cm)  BEHAVIORAL SYMPTOMS/MOOD NEUROLOGICAL BOWEL NUTRITION STATUS      Continent Diet(Heart Healthy)  AMBULATORY STATUS COMMUNICATION OF NEEDS Skin   Limited Assist Verbally Normal                       Personal Care Assistance Level of Assistance  Bathing, Feeding, Dressing Bathing Assistance: Limited assistance Feeding assistance: Independent Dressing Assistance: Limited assistance     Functional Limitations Info  Sight, Hearing, Speech Sight Info: Adequate Hearing Info: Adequate Speech Info: Adequate    SPECIAL CARE FACTORS FREQUENCY  PT (By licensed PT)     PT Frequency: 5 X week              Contractures Contractures Info: Not present    Additional Factors Info  Code Status, Allergies Code Status Info: FULL Allergies Info: Heparin           Current Medications (06/03/2018):  This is the current hospital active medication list Current Facility-Administered Medications  Medication Dose Route Frequency  Provider Last Rate Last Dose  . acetaminophen (TYLENOL) tablet 650 mg  650 mg Oral Q6H PRN [place] SirenSainani, Vivek J, MD       Or  . acetaminophen (TYLENOL) suppository 650 mg  650 mg Rectal Q6H PRN [place] SirenSainani, Vivek J, MD      . furosemide (LASIX) injection 20 mg  20 mg Intravenous Q12H [place] SirenSainani, Vivek J, MD   20 mg at 06/03/18 0901  . guaiFENesin (ROBITUSSIN) 100 MG/5ML solution 200 mg  10 mL Oral Q6H PRN [place] SirenSainani, Vivek J, MD      . ipratropium-albuterol (DUONEB) 0.5-2.5 (3) MG/3ML nebulizer solution 3 mL  3 mL Nebulization Q4H Auburn BilberryPatel, Shreyang, MD   3 mL at 06/03/18 1136  . MEDLINE mouth rinse  15 mL Mouth Rinse BID Auburn BilberryPatel, Shreyang, MD   15 mL at 06/03/18 0902  . metoprolol succinate (TOPROL-XL) 24 hr tablet 25 mg  25 mg Oral Daily [place] SirenSainani, Vivek J, MD   25 mg at 06/03/18 0901  . OLANZapine (ZYPREXA) tablet 30 mg  30 mg Oral Daily [place] SirenSainani, Vivek J, MD   30 mg at 06/02/18 2109  . ondansetron (ZOFRAN) tablet 4 mg  4 mg Oral Q6H PRN [place] SirenSainani, Vivek J, MD       Or  . ondansetron (ZOFRAN) injection 4 mg  4 mg Intravenous Q6H PRN Sainani, Rolly PancakeVivek J, MD      . pantoprazole (PROTONIX) EC tablet 40 mg  40 mg Oral Daily Houston SirenSainani, Vivek J, MD  40 mg at 06/03/18 0902  . PARoxetine (PAXIL-CR) 24 hr tablet 25 mg  25 mg Oral Daily [place] Siren, MD   25 mg at 06/03/18 0901  . risperiDONE (RISPERDAL) tablet 2 mg  2 mg Oral BID [place] Siren, MD   2 mg at 06/03/18 0901  . rivaroxaban (XARELTO) tablet 20 mg  20 mg Oral Daily [place] Siren, MD   20 mg at 06/03/18 0902  . simvastatin (ZOCOR) tablet 20 mg  20 mg Oral QHS Houston Siren, MD   20 mg at 06/02/18 2109  . vitamin B-12 (CYANOCOBALAMIN) tablet 1,000 mcg  1,000 mcg Oral Daily Houston Siren, MD   1,000 mcg at 06/03/18 0902     Discharge Medications: Please see discharge summary for a list of discharge medications.  Relevant Imaging Results:  Relevant Lab Results:   Additional Information SSN: [id number]  Sherren Kerns, RN

## 2018-06-03 NOTE — Progress Notes (Signed)
[facility]  [facility]   PATIENT NAME: Anthony Chambers    MR#:  [id number]  DATE OF BIRTH:  Apr 04, 1945  SUBJECTIVE:   Chief Complaint  Patient presents with  . Shortness of Breath   Patient difficult to arouse this morning responds to vigorous sternal rubs and will withdraw to noxious stimuli in bilateral lower extremity. He opens his eyes briefly and mumbles incomprehensibly then falls back to sleep.  REVIEW OF SYSTEMS:  Patient is a poor historian currently difficult to arouse therefore unable to obtain review of systems  DRUG ALLERGIES:   Allergies  Allergen Reactions  . Heparin    VITALS:  Blood pressure 122/71, pulse 61, temperature 98.6 F (37 C), temperature source Oral, resp. rate 16, height 6\' 3"  (1.905 m), weight 114.5 kg, SpO2 94 %. PHYSICAL EXAMINATION:   GENERAL:  73 y.o.-year-old patient lying in the bed with no acute distress.  EYES: Pupils equal, round, reactive to light and accommodation. No scleral icterus. Extraocular muscles intact.  HEENT: Head atraumatic, normocephalic. Oropharynx and nasopharynx clear.  NECK:  Supple, no jugular venous distention. No thyroid enlargement, no tenderness.  LUNGS: Normal breath sounds bilaterally, no wheezing, rales,rhonchi or crepitation. No use of accessory muscles of respiration.  CARDIOVASCULAR: S1, S2 normal. No murmurs, rubs, or gallops.  ABDOMEN: Soft, nontender, nondistended. Bowel sounds present. No organomegaly or mass.  EXTREMITIES: Bilateral pitting edema of legs.No cyanosis, or clubbing.  NEUROLOGIC: Cranial nerves II through XII are intact. Muscle strength 5/5 in all extremities. Sensation intact. Gait not checked.  PSYCHIATRIC: The patient is asleep difficult to arouse SKIN: No obvious rash, lesion, or ulcer.   DATA REVIEWED:  LABORATORY PANEL:  Male CBC Recent Labs  Lab 06/02/18 0508  WBC 5.3  HGB 12.9*  HCT 43.7  PLT 184    ------------------------------------------------------------------------------------------------------------------ Chemistries  Recent Labs  Lab 06/03/18 0300  NA 144  K 4.1  CL 98  CO2 38*  GLUCOSE 97  BUN 22  CREATININE 1.07  CALCIUM 8.1*   RADIOLOGY:  No results found. ASSESSMENT AND PLAN:   73 y.o. male with a known history of hypertension, hyperlipidemia, GERD, depression, schizophrenia who presents to the hospital due to shortness of breath and noted to be in acute respiratory failure with hypoxia and hypercapnia.  1. Acute respiratory failure with hypoxia and hypercapnia- suspected to be secondary to underlying CHF with underlying COPD. -Wean oxygen as tolerated -Scheduled DuoNeb's  2. Acute decompensated Diastolic Congestive Heart Failure- Likely volume overload - Last Echo 06/02/18. Normal LVF with LVEF of 55-60% - Beta-Blockade:  Metoprolol - Diuretics: Furosemide 40 PO BID - Low salt diet - Strict I&O  3. Acute on chronic COPD Exacerbation - Oxygenation improved. - Supplemental O2, goal sat 88-92% wean as tolerated - DuoNEb and pulmicort nebulizer therapy  - No risk factors for Pseudomonas (recent hospitalization within 90 days, frequent antibiotics >4 courses/year, severe COPD, prior documented Pseudomonas) or persistent symptoms in spite of empiric antibiotics.) - Incentive spirometry/vest therapy - PT following - Pulmonary consult appreciated  4. History of atrial fibrillation-rate controlled.  Continue Toprol, continue Xarelto.  5. Hyperlipidemia-continue Zocor.  6. History of schizophrenia/depression  - Continue Risperdal, Zyprexa.  7. GERD - stable  - Continue Protonix.  All the records are reviewed and case discussed with Care Management/Social Worker. Management plans discussed with the patient, family and they are in agreement.  CODE STATUS: Full Code  TOTAL TIME TAKING CARE OF THIS PATIENT: 40 minutes.  More than 50% of the  time was spent in counseling/coordination of care: YES  POSSIBLE D/C IN 1-2 DAYS, DEPENDING ON CLINICAL CONDITION.   on 06/03/2018 at 4:14 PM  This patient was staffed with Dr. Sarina Ser, Sherryll Burger who personally evaluated patient, reviewed documentation and agreed with assessment and plan of care as above.  Webb Silversmith, DNP, FNP-BC [facility] Nurse Practitioner   Between 7am to 6pm - Pager [phone]  After 6pm go to [email or web address] - Social research officer, government  [facility] [place] [facility]  Office  [phone]  CC: Primary care physician; Dorothey Baseman, MD  Note: This dictation was prepared with Dragon dictation along with smaller phrase technology. Any transcriptional errors that result from this process are unintentional.

## 2018-06-03 NOTE — Consult Note (Signed)
Pulmonary Medicine          Date: 06/03/2018,   MRN# [id number] Anthony Chambers November 03, 1945     AdmissionWeight: 122.7 kg                 CurrentWeight: 114.5 kg      CHIEF COMPLAINT:   Acute hypoxemic respiratory failure   HISTORY OF PRESENT ILLNESS   This is a 73 yo male with a hx of GERD, DVT, PVD, dyslipidemia, HFpEF wEF >55%, came in to ED due to worsening SOB.  Patient was admitted with possible Acute decompensated diastolic CHF, had been diuresed.  Background history of hypertension, hyperlipidemia, GERD, depression, schizophrenia.  Poor historian due to psych hx including schizophrenia.   He lives in SNF noted severe hypoxemia with SpO2 <60%.  He states currently main thing that's bothering hims is abldominal discomfort from constipation.   He had been complaining of shortness of breath for the past few days and had a COVID-19 test done on 13 May which was negative.  Given his hypoxemia he was sent back to the ER for further evaluation.  Patient underwent an VBG while in the ER and was noted to be in hypercapnic respiratory failure with PCO2 greater than 90.  He did not appear to be in any worsening respiratory distress.   Patient has had no fever, chills nausea vomiting chest pain or any other associated symptoms.  His repeat COVID-19 test is negative.  Microbiology with +Blood culture for MRSE likely contaminant, CBC without leukocytosis, he has mild peripheral LE edema and stasis dermatitis.  He had TTE done with similar findings as before, normal systolic function with EF >55%.  He denies flu like illness, denies fevers, states he had fallen few weeks ago but was able to recover without injury and fall was due to water on floor.  He denies constitutional symptoms.    PAST MEDICAL HISTORY   Past Medical History:  Diagnosis Date  . Depression   . GERD (gastroesophageal reflux disease)   . Hyperlipidemia   . Hypertension   . Schizophrenia (HCC)   . Total  occlusion of coronary artery, chronic      SURGICAL HISTORY   Unable to obtain due to unresponsive state.     FAMILY HISTORY   Family History  Problem Relation Age of Onset  . Asthma Mother      SOCIAL HISTORY   Social History   Tobacco Use  . Smoking status: Former Smoker    Years: 40.00    Types: Cigarettes  Substance Use Topics  . Alcohol use: Not Currently  . Drug use: Never     MEDICATIONS    [facility]   Current Medication:  Current Facility-Administered Medications:  .  acetaminophen (TYLENOL) tablet 650 mg, 650 mg, Oral, Q6H PRN **OR** acetaminophen (TYLENOL) suppository 650 mg, 650 mg, Rectal, Q6H PRN, Sainani, Vivek J, MD .  furosemide (LASIX) injection 20 mg, 20 mg, Intravenous, Q12H, Sainani, Rolly PancakeVivek J, MD, 20 mg at 06/02/18 2109 .  guaiFENesin (ROBITUSSIN) 100 MG/5ML solution 200 mg, 10 mL, Oral, Q6H PRN, Sainani, Vivek J, MD .  ipratropium-albuterol (DUONEB) 0.5-2.5 (3) MG/3ML nebulizer solution 3 mL, 3 mL, Nebulization, Q4H, Auburn BilberryPatel, Shreyang, MD, 3 mL at 06/03/18 0717 .  MEDLINE mouth rinse, 15 mL, Mouth Rinse, BID, Auburn BilberryPatel, Shreyang, MD, 15 mL at 06/02/18 2113 .  metoprolol succinate (TOPROL-XL) 24 hr tablet 25 mg, 25 mg, Oral, Daily, Sainani, Vivek J,  MD, 25 mg at 06/02/18 0937 .  OLANZapine (ZYPREXA) tablet 30 mg, 30 mg, Oral, Daily, [place] Siren, MD, 30 mg at 06/02/18 2109 .  ondansetron (ZOFRAN) tablet 4 mg, 4 mg, Oral, Q6H PRN **OR** ondansetron (ZOFRAN) injection 4 mg, 4 mg, Intravenous, Q6H PRN, Sainani, Vivek J, MD .  pantoprazole (PROTONIX) EC tablet 40 mg, 40 mg, Oral, Daily, [place] Siren, MD, 40 mg at 06/02/18 0936 .  PARoxetine (PAXIL-CR) 24 hr tablet 25 mg, 25 mg, Oral, Daily, Houston Siren, MD, 25 mg at 06/02/18 0937 .  risperiDONE (RISPERDAL) tablet 2 mg, 2 mg, Oral, BID, Houston Siren, MD, 2 mg at 06/02/18 2109 .  rivaroxaban (XARELTO) tablet 20 mg, 20 mg, Oral, Daily, Houston Siren, MD, 20 mg at 06/02/18  0936 .  simvastatin (ZOCOR) tablet 20 mg, 20 mg, Oral, QHS, Sainani, Rolly Pancake, MD, 20 mg at 06/02/18 2109 .  vitamin B-12 (CYANOCOBALAMIN) tablet 1,000 mcg, 1,000 mcg, Oral, Daily, Sainani, Vivek J, MD, 1,000 mcg at 06/02/18 7672    ALLERGIES   Heparin     REVIEW OF SYSTEMS    Review of Systems:  Gen:  Denies  fever, sweats, chills weigh loss  HEENT: Denies blurred vision, double vision, ear pain, eye pain, hearing loss, nose bleeds, sore throat Cardiac:  No dizziness, chest pain or heaviness, chest tightness,edema Resp:  +shortness of breath,wheezing,   Gi: Denies swallowing difficulty, stomach pain, nausea or vomiting, diarrhea, constipation, bowel incontinence Gu:  Denies bladder incontinence, burning urine Ext:   Denies Joint pain, stiffness or swelling Skin: Denies  skin rash, easy bruising or bleeding or hives Endoc:  Denies polyuria, polydipsia , polyphagia or weight change Psych:   Denies depression, insomnia or hallucinations   Other:  All other systems negative   VS: BP 109/68 (BP Location: Right Arm)   Pulse 62   Temp 98.3 F (36.8 C) (Oral)   Resp 20   Ht 6\' 3"  (1.905 m)   Wt 114.5 kg   SpO2 90%   BMI 31.55 kg/m      PHYSICAL EXAM    GENERAL:NAD, no fevers, chills, no weakness no fatigue HEAD: Normocephalic, atraumatic.  EYES: Pupils equal, round, reactive to light. Extraocular muscles intact. No scleral icterus.  MOUTH: Moist mucosal membrane. Dentition intact. No abscess noted.  EAR, NOSE, THROAT: Clear without exudates. No external lesions.  NECK: Supple. No thyromegaly. No nodules. No JVD.  PULMONARY:bilateral inspiratory and expiratory crackles without wheezing or rhonchi CARDIOVASCULAR: S1 and S2. Regular rate and rhythm. No murmurs, rubs, or gallops. No edema. Pedal pulses 2+ bilaterally.  GASTROINTESTINAL: Soft, nontender, nondistended. No masses. Positive bowel sounds. No hepatosplenomegaly.  MUSCULOSKELETAL: No swelling, clubbing, or  edema. Range of motion full in all extremities.  NEUROLOGIC: Cranial nerves II through XII are intact. No gross focal neurological deficits. Sensation intact. Reflexes intact.  SKIN: No ulceration, lesions, rashes, or cyanosis. Skin warm and dry. Turgor intact.  PSYCHIATRIC: Mood, affect within normal limits. The patient is awake, alert and oriented x 3. Insight, judgment intact.       IMAGING    Dg Chest Port 1 View  Result Date: 06/01/2018 CLINICAL DATA:  Worsening shortness of breath since yesterday EXAM: PORTABLE CHEST 1 VIEW COMPARISON:  None. FINDINGS: Cardiomegaly. Coarse interstitial markings bilaterally indicating interstitial edema or chronic interstitial lung disease. No confluent opacity to suggest a consolidating pneumonia. No pleural effusion or pneumothorax seen. Elevation of the RIGHT hemidiaphragm, of uncertain chronicity. Osseous structures about  the chest are unremarkable. IMPRESSION: 1. Cardiomegaly. 2. Coarse interstitial markings bilaterally, compatible with CHF and/or chronic interstitial lung disease. 3. No evidence of consolidating pneumonia seen. Electronically Signed   By: Bary Richard M.D.   On: 06/01/2018 16:25       Order: [id number]  Status:  Final result Visible to patient:  No (Not Released) Next appt:  None   Ref Range & Units 2d ago  B Natriuretic Peptide 0.0 - 100.0 pg/mL 815.0High    Comment: Performed at [facility], [address], [place], [place] [address]         ASSESSMENT/PLAN   Acute hypoxemic and hypercarbic respiratory failure     -Likely due to acute decompensated diastolic CHF with EF >55%      - Evidenced by BNP elevation, CXR with CPA blunting and cephalization suggestive of interstitial edema, pitting LE edema and increased O2 requirement, complicated by atelectasis     - Currently net 2.5 L negative, able to tolerate diuresis well will increase to 40 BID from 20 BID     - stict I&Os,  And 1200cc fluid restriction/ 24  hours     - Noctural BIPAP to help with hypcapnia which may worsen as we diurese          COPD    - likely partially responsible for hypercapnia as pH is normal with pCO2 68    - would start on DuoNEb and pulmicort nebulizer therapy     - no need for prednisone as patient does not have rhonchi/wheezing and is not coughing up thick plegm nor complaining of increased volume of respiratory secrtions        Atelectasis      - incentive spirometery    - chest physiotherapy - patient would benefit from VEST therapy    - PT to ambulate /ROM exercise        Thank you for allowing me to participate in the care of this patient.   Patient/Family are satisfied with care plan and all questions have been answered.  This document was prepared using Dragon voice recognition software and may include unintentional dictation errors.     Vida Rigger, M.D.  Division of Pulmonary & Critical Care Medicine  [facility]

## 2018-06-03 NOTE — Progress Notes (Signed)
Patient not able to perform IS

## 2018-06-03 NOTE — Progress Notes (Signed)
PT Attempt Note  Patient Details Name: Anthony Chambers MRN: [id number] DOB: Feb 09, 1945   Evaluation Attempt:    Reason Eval/Treat Not Completed: Fatigue/lethargy limiting ability to participate. Order received, chart reviewed, and RN consulted. Attempted to see patient but despite prolonged attempts to keep patient awake he is unable to keep his eyes open or engage. RN notified who comes to assess. RN performing assessment and also unable to keep patient awake for more than a few seconds. Will attempt PT evaluation on later date/time as pt is able to participate.  Sharalyn Ink Huprich PT, DPT, GCS  Huprich,Jason 06/03/2018, 9:01 AM

## 2018-06-04 ENCOUNTER — Encounter: Payer: Self-pay | Admitting: *Deleted

## 2018-06-04 LAB — BASIC METABOLIC PANEL
Anion gap: 8 (ref 5–15)
BUN: 24 mg/dL — ABNORMAL HIGH (ref 8–23)
CO2: 39 mmol/L — ABNORMAL HIGH (ref 22–32)
Calcium: 8.1 mg/dL — ABNORMAL LOW (ref 8.9–10.3)
Chloride: 93 mmol/L — ABNORMAL LOW (ref 98–111)
Creatinine, Ser: 1.18 mg/dL (ref 0.61–1.24)
GFR calc Af Amer: >60 mL/min (ref >60)
GFR calc non Af Amer: >60 mL/min (ref >60)
Glucose, Bld: 111 mg/dL — ABNORMAL HIGH (ref 70–99)
Potassium: 4.3 mmol/L (ref 3.5–5.1)
Sodium: 140 mmol/L (ref 135–145)

## 2018-06-04 LAB — MAGNESIUM: Magnesium: 2.2 mg/dL (ref 1.7–2.4)

## 2018-06-04 LAB — CULTURE, BLOOD (ROUTINE X 2): Special Requests: ADEQUATE

## 2018-06-04 NOTE — Progress Notes (Signed)
[employer] [facility] - [facility]   PATIENT NAME: Anthony Chambers    MR#:  [id number]  DATE OF BIRTH:  December 14, 1945  SUBJECTIVE:   Chief Complaint  Patient presents with  . Shortness of Breath    Patient is seen at the bedside. He is more awake and alert today. He report that breathing is much better. Overall he feels his condition is gradually improving. Voices no new complaints. Per nursing staff, patient had a run of Vtach (17 beats) this am asymptomatic.  REVIEW OF SYSTEMS:  Review of Systems  Constitutional: Negative for chills, fever, malaise/fatigue and weight loss.  HENT: Negative for congestion, hearing loss and sore throat.   Eyes: Negative for blurred vision and double vision.  Respiratory: Positive for shortness of breath. Negative for cough and wheezing.   Cardiovascular: Positive for leg swelling. Negative for chest pain, palpitations and orthopnea.  Gastrointestinal: Negative for abdominal pain, diarrhea, nausea and vomiting.  Genitourinary: Negative for dysuria and urgency.  Musculoskeletal: Negative for myalgias.  Skin: Negative for rash.  Neurological: Negative for dizziness, sensory change, speech change, focal weakness and headaches.  Psychiatric/Behavioral: Negative for depression.   DRUG ALLERGIES:   Allergies  Allergen Reactions  . Heparin    VITALS:  Blood pressure 126/71, pulse 62, temperature 98.7 F (37.1 C), resp. rate 16, height 6\' 3"  (1.905 m), weight 113.4 kg, SpO2 93 %. PHYSICAL EXAMINATION:   GENERAL:  73 y.o.-year-old patient lying in the bed with no acute distress.  EYES: Pupils equal, round, reactive to light and accommodation. No scleral icterus. Extraocular muscles intact.  HEENT: Head atraumatic, normocephalic. Oropharynx and nasopharynx clear.  NECK:  Supple, no jugular venous distention. No thyroid enlargement, no tenderness.  LUNGS: Normal breath sounds bilaterally, no wheezing, rales,rhonchi or crepitation. No use  of accessory muscles of respiration.  CARDIOVASCULAR: S1, S2 normal. No murmurs, rubs, or gallops.  ABDOMEN: Soft, nontender, nondistended. Bowel sounds present. No organomegaly or mass.  EXTREMITIES: Bilateral pitting edema of legs.No cyanosis, or clubbing.  NEUROLOGIC: Cranial nerves II through XII are intact. Muscle strength 5/5 in all extremities. Sensation intact. Gait not checked.  PSYCHIATRIC: The patient is asleep difficult to arouse SKIN: No obvious rash, lesion, or ulcer.   DATA REVIEWED:  LABORATORY PANEL:  Male CBC Recent Labs  Lab 06/02/18 0508  WBC 5.3  HGB 12.9*  HCT 43.7  PLT 184   ------------------------------------------------------------------------------------------------------------------ Chemistries  Recent Labs  Lab 06/04/18 0306  NA 140  K 4.3  CL 93*  CO2 39*  GLUCOSE 111*  BUN 24*  CREATININE 1.18  CALCIUM 8.1*  MG 2.2   RADIOLOGY:  No results found. ASSESSMENT AND PLAN:   73 y.o. male with a known history of hypertension, hyperlipidemia, GERD, depression, schizophrenia who presents to the hospital due to shortness of breath and noted to be in acute respiratory failure with hypoxia and hypercapnia.  1. Acute respiratory failure with hypoxia and hypercapnia- suspected to be secondary to underlying CHF with underlying COPD. - Significant improvement noted today -Wean oxygen as tolerated - Continue scheduled DuoNeb's  2. Acute decompensated Diastolic Congestive Heart Failure- Likely volume overload - Last Echo 06/02/18. Normal LVF with LVEF of 55-60% - Beta-Blockade:  Metoprolol - Diuretics: Furosemide 40 PO BID - Low salt diet - Strict I&O - 1200cc fluid restriction/ 24 hours - Report of Vtach (17 beats) per nursing staff, will get Cardiology to evaluate  3. Acute on chronic COPD Exacerbation - Oxygenation improved. - Supplemental O2,  goal sat 88-92% wean as tolerated - DuoNEb and pulmicort nebulizer therapy  - No risk factors for  Pseudomonas (recent hospitalization within 90 days, frequent antibiotics >4 courses/year, severe COPD, prior documented Pseudomonas) or persistent symptoms in spite of empiric antibiotics.) - Incentive spirometry/vest therapy - PT following - Pulmonary consult appreciated  4. History of atrial fibrillation-rate controlled.  - Continue Toprol, continue Xarelto.  5. Hyperlipidemia-continue Zocor.  6. History of schizophrenia/depression  - Continue Risperdal, Zyprexa.  7. GERD - stable  - Continue Protonix.  All the records are reviewed and case discussed with Care Management/Social Worker. Management plans discussed with the patient, family and they are in agreement.  CODE STATUS: Full Code  TOTAL TIME TAKING CARE OF THIS PATIENT: 38 minutes.   More than 50% of the time was spent in counseling/coordination of care: YES  POSSIBLE D/C IN 1-2 DAYS, DEPENDING ON CLINICAL CONDITION.   on 06/04/2018 at 1:17 PM  This patient was staffed with Dr. Sarina SerVipul, Sherryll BurgerShah who personally evaluated patient, reviewed documentation and agreed with assessment and plan of care as above.  Webb SilversmithElizabeth Miles Borkowski, DNP, FNP-BC [facility] Nurse Practitioner   Between 7am to 6pm - Pager [phone]- [phone]  After 6pm go to [email or web address] - Social research officer, governmentpassword [email or web address]  [facility] [place] [facility]  Office  [phone]  CC: Primary care physician; Dorothey BasemanBronstein, David, MD  Note: This dictation was prepared with Dragon dictation along with smaller phrase technology. Any transcriptional errors that result from this process are unintentional.

## 2018-06-04 NOTE — Progress Notes (Signed)
SATURATION QUALIFICATIONS: (This note is used to comply with regulatory documentation for home oxygen)  Patient Saturations on Room Air at Rest = 84%  Patient Saturations on Room Air while Ambulating = n/a%  Patient Saturations on n/a Liters of oxygen while Ambulating =n/a%  Please briefly explain why patient needs home oxygen:  Patient currently on 4L  at rest to maintain sats 90% and above.

## 2018-06-04 NOTE — Progress Notes (Signed)
Pulmonary Medicine          Date: 06/04/2018,   MRN# [id number] Anthony Chambers 03-09-1945     AdmissionWeight: 122.7 kg                 CurrentWeight: 113.4 kg       SUBJECTIVE    Patient resting in bed in no distress.  Breathing improved significanlty.    PAST MEDICAL HISTORY   Past Medical History:  Diagnosis Date  . Depression   . GERD (gastroesophageal reflux disease)   . Hyperlipidemia   . Hypertension   . Schizophrenia (HCC)   . Total occlusion of coronary artery, chronic      SURGICAL HISTORY   History reviewed. No pertinent surgical history.   FAMILY HISTORY   Family History  Problem Relation Age of Onset  . Asthma Mother      SOCIAL HISTORY   Social History   Tobacco Use  . Smoking status: Former Smoker    Years: 40.00    Types: Cigarettes  . Smokeless tobacco: Never Used  Substance Use Topics  . Alcohol use: Not Currently  . Drug use: Never     MEDICATIONS    Home Medication:    Current Medication:  Current Facility-Administered Medications:  .  acetaminophen (TYLENOL) tablet 650 mg, 650 mg, Oral, Q6H PRN **OR** acetaminophen (TYLENOL) suppository 650 mg, 650 mg, Rectal, Q6H PRN, Cherlynn Kaiser, Vivek J, MD .  budesonide (PULMICORT) nebulizer solution 0.25 mg, 0.25 mg, Nebulization, BID, Kjersten Ormiston, MD, 0.25 mg at 06/04/18 0728 .  furosemide (LASIX) injection 40 mg, 40 mg, Intravenous, Q12H, Vida Rigger, MD, 40 mg at 06/04/18 0826 .  guaiFENesin (ROBITUSSIN) 100 MG/5ML solution 200 mg, 10 mL, Oral, Q6H PRN, Sainani, Vivek J, MD .  ipratropium-albuterol (DUONEB) 0.5-2.5 (3) MG/3ML nebulizer solution 3 mL, 3 mL, Nebulization, Q4H, Auburn Bilberry, MD, 3 mL at 06/04/18 0728 .  MEDLINE mouth rinse, 15 mL, Mouth Rinse, BID, Auburn Bilberry, MD, 15 mL at 06/04/18 0827 .  metoprolol succinate (TOPROL-XL) 24 hr tablet 25 mg, 25 mg, Oral, Daily, Sainani, Rolly Pancake, MD, 25 mg at 06/04/18 0827 .  OLANZapine (ZYPREXA) tablet 30 mg,  30 mg, Oral, Daily, Houston Siren, MD, 30 mg at 06/03/18 2149 .  ondansetron (ZOFRAN) tablet 4 mg, 4 mg, Oral, Q6H PRN **OR** ondansetron (ZOFRAN) injection 4 mg, 4 mg, Intravenous, Q6H PRN, Sainani, Vivek J, MD .  pantoprazole (PROTONIX) EC tablet 40 mg, 40 mg, Oral, Daily, [place] Siren, MD, 40 mg at 06/04/18 0826 .  PARoxetine (PAXIL-CR) 24 hr tablet 25 mg, 25 mg, Oral, Daily, Sainani, Rolly Pancake, MD, 25 mg at 06/04/18 0827 .  risperiDONE (RISPERDAL) tablet 2 mg, 2 mg, Oral, BID, [place] Siren, MD, 2 mg at 06/04/18 0827 .  rivaroxaban (XARELTO) tablet 20 mg, 20 mg, Oral, Daily, Houston Siren, MD, 20 mg at 06/04/18 0826 .  senna-docusate (Senokot-S) tablet 2 tablet, 2 tablet, Oral, BID, Delfino Lovett, MD, 2 tablet at 06/04/18 0827 .  simvastatin (ZOCOR) tablet 20 mg, 20 mg, Oral, QHS, Sainani, Rolly Pancake, MD, 20 mg at 06/03/18 2149 .  vitamin B-12 (CYANOCOBALAMIN) tablet 1,000 mcg, 1,000 mcg, Oral, Daily, Houston Siren, MD, 1,000 mcg at 06/04/18 8657    ALLERGIES   Heparin     REVIEW OF SYSTEMS    Review of Systems:  Gen:  Denies  fever, sweats, chills weigh loss  HEENT: Denies blurred vision, double vision, ear pain,  eye pain, hearing loss, nose bleeds, sore throat Cardiac:  No dizziness, chest pain or heaviness, chest tightness,edema Resp:   Denies cough or sputum porduction, shortness of breath,wheezing, hemoptysis,  Gi: Denies swallowing difficulty, stomach pain, nausea or vomiting, diarrhea, constipation, bowel incontinence Gu:  Denies bladder incontinence, burning urine Ext:   Denies Joint pain, stiffness or swelling Skin: Denies  skin rash, easy bruising or bleeding or hives Endoc:  Denies polyuria, polydipsia , polyphagia or weight change Psych:   Denies depression, insomnia or hallucinations   Other:  All other systems negative   VS: BP 126/71   Pulse 62   Temp 98.7 F (37.1 C)   Resp 16   Ht 6\' 3"  (1.905 m)   Wt 113.4 kg   SpO2 93%   BMI 31.26 kg/m       PHYSICAL EXAM    GENERAL:NAD, no fevers, chills, no weakness no fatigue HEAD: Normocephalic, atraumatic.  EYES: Pupils equal, round, reactive to light. Extraocular muscles intact. No scleral icterus.  MOUTH: Moist mucosal membrane. Dentition intact. No abscess noted.  EAR, NOSE, THROAT: Clear without exudates. No external lesions.  NECK: Supple. No thyromegaly. No nodules. No JVD.  PULMONARY: mild bibasilar crackles CARDIOVASCULAR: S1 and S2. Regular rate and rhythm. No murmurs, rubs, or gallops. No edema. Pedal pulses 2+ bilaterally.  GASTROINTESTINAL: Soft, nontender, nondistended. No masses. Positive bowel sounds. No hepatosplenomegaly.  MUSCULOSKELETAL: No swelling, clubbing, or edema. Range of motion full in all extremities.  NEUROLOGIC: Cranial nerves II through XII are intact. No gross focal neurological deficits. Sensation intact. Reflexes intact.  SKIN: No ulceration, lesions, rashes, or cyanosis. Skin warm and dry. Turgor intact.  PSYCHIATRIC: Mood, affect within normal limits. The patient is awake, alert and oriented x 3. Insight, judgment intact.       IMAGING    Dg Chest Port 1 View  Result Date: 06/01/2018 CLINICAL DATA:  Worsening shortness of breath since yesterday EXAM: PORTABLE CHEST 1 VIEW COMPARISON:  None. FINDINGS: Cardiomegaly. Coarse interstitial markings bilaterally indicating interstitial edema or chronic interstitial lung disease. No confluent opacity to suggest a consolidating pneumonia. No pleural effusion or pneumothorax seen. Elevation of the RIGHT hemidiaphragm, of uncertain chronicity. Osseous structures about the chest are unremarkable. IMPRESSION: 1. Cardiomegaly. 2. Coarse interstitial markings bilaterally, compatible with CHF and/or chronic interstitial lung disease. 3. No evidence of consolidating pneumonia seen. Electronically Signed   By: Bary Richard M.D.   On: 06/01/2018 16:25      ASSESSMENT/PLAN    Acute hypoxemic and hypercarbic  respiratory failure     -Likely due to acute decompensated diastolic CHF with EF >55%      - Evidenced by BNP elevation, CXR with CPA blunting and cephalization suggestive of interstitial edema, pitting LE edema and increased O2 requirement, complicated by atelectasis     - Currently net >5L negative, able to tolerate diuresis well will increase to 40 BID from 20 BID     - stict I&Os,  And 1200cc fluid restriction/ 24 hours     - Noctural BIPAP to help with hypcapnia which may worsen as we diurese          COPD    - likely partially responsible for hypercapnia as pH is normal with pCO2 68    - would start on DuoNEb and pulmicort nebulizer therapy     - no need for prednisone as patient does not have rhonchi/wheezing and is not coughing up thick plegm nor complaining of increased volume of respiratory  secrtions        Atelectasis      - incentive spirometery    - chest physiotherapy - patient would benefit from VEST therapy    - PT to ambulate /ROM exercise   -I will set up follow up in pulmonary clinic for post hospitalization eval.    Thank you for allowing me to participate in the care of this patient.    Patient/Family are satisfied with care plan and all questions have been answered.  This document was prepared using Dragon voice recognition software and may include unintentional dictation errors.     Vida RiggerFuad Omayra Tulloch, M.D.  Division of Pulmonary & Critical Care Medicine  [facility]

## 2018-06-04 NOTE — Plan of Care (Signed)
Continues on 2L O2, 5L with bipap at night    Problem: Coping: Goal: Level of anxiety will decrease Outcome: Progressing   Problem: Elimination: Goal: Will not experience complications related to urinary retention Outcome: Progressing   Problem: Pain Managment: Goal: General experience of comfort will improve Outcome: Progressing Note:  No complaints of pain this shift   Problem: Safety: Goal: Ability to remain free from injury will improve Outcome: Progressing   Problem: Skin Integrity: Goal: Risk for impaired skin integrity will decrease Outcome: Progressing   Problem: Cardiac: Goal: Ability to achieve and maintain adequate cardiopulmonary perfusion will improve Outcome: Progressing Note:  Receiving IV lasix with good output

## 2018-06-04 NOTE — Care Management Important Message (Signed)
Important Message  Patient Details  Name: Anthony Chambers MRN: [id number] Date of Birth: 03-13-1945   Medicare Important Message Given:  Other (see comment)  Patient asleep at time of visit.  Needs initial signed.  Blank copy left in room for review.  Will attempt at later time.    Johnell Comings 06/04/2018, 3:19 PM

## 2018-06-04 NOTE — Plan of Care (Signed)
Patient encouraged by PT and this RN for activity but refuses at this time.   Problem: Activity: Goal: Risk for activity intolerance will decrease Outcome: Not Progressing

## 2018-06-04 NOTE — Progress Notes (Signed)
PT Cancellation Note  Patient Details Name: Anthony Chambers MRN: [id number] DOB: 01-Dec-1945   Cancelled Treatment:    Reason Eval/Treat Not Completed: Patient declined, no reason specified;Other (comment)(Patient refused PT "until I poop", offerred to transfer patient to [facility] for bowel movement, patient refused. )  Precious Bard, PT, DPT   06/04/2018, 4:40 PM

## 2018-06-05 ENCOUNTER — Inpatient Hospital Stay: Payer: Medicare Other

## 2018-06-05 LAB — BASIC METABOLIC PANEL
Anion gap: 10 (ref 5–15)
BUN: 33 mg/dL — ABNORMAL HIGH (ref 8–23)
CO2: 38 mmol/L — ABNORMAL HIGH (ref 22–32)
Calcium: 8.4 mg/dL — ABNORMAL LOW (ref 8.9–10.3)
Chloride: 93 mmol/L — ABNORMAL LOW (ref 98–111)
Creatinine, Ser: 1.31 mg/dL — ABNORMAL HIGH (ref 0.61–1.24)
GFR calc Af Amer: >60 mL/min (ref >60)
GFR calc non Af Amer: 54 mL/min — ABNORMAL LOW (ref >60)
Glucose, Bld: 97 mg/dL (ref 70–99)
Potassium: 3.8 mmol/L (ref 3.5–5.1)
Sodium: 141 mmol/L (ref 135–145)

## 2018-06-05 LAB — POTASSIUM: Potassium: 4.2 mmol/L (ref 3.5–5.1)

## 2018-06-05 LAB — MAGNESIUM: Magnesium: 2.1 mg/dL (ref 1.7–2.4)

## 2018-06-05 MED ORDER — SODIUM CHLORIDE 0.9% FLUSH: 3.0000 mL | INTRAVENOUS | Status: DC | PRN

## 2018-06-05 MED ORDER — FUROSEMIDE 10 MG/ML IJ SOLN
40.0000 mg | Freq: Every day | INTRAMUSCULAR | Status: DC
  Administered 2018-06-06 – 2018-06-08 (×3): 40 mg via INTRAVENOUS
  Filled 2018-06-05 (×3): qty 4

## 2018-06-05 MED ORDER — IPRATROPIUM-ALBUTEROL 0.5-2.5 (3) MG/3ML IN SOLN
3.0000 mL | Freq: Four times a day (QID) | RESPIRATORY_TRACT | Status: DC
  Administered 2018-06-05 – 2018-06-09 (×17): 3 mL via RESPIRATORY_TRACT
  Filled 2018-06-05 (×18): qty 3

## 2018-06-05 MED ORDER — SODIUM CHLORIDE 0.9% FLUSH
3.0000 mL | Freq: Two times a day (BID) | INTRAVENOUS | Status: DC
  Administered 2018-06-05 – 2018-06-09 (×9): 3 mL via INTRAVENOUS

## 2018-06-05 MED ORDER — ALBUTEROL SULFATE (2.5 MG/3ML) 0.083% IN NEBU: 2.5000 mg | INHALATION_SOLUTION | RESPIRATORY_TRACT | Status: DC | PRN

## 2018-06-05 NOTE — Evaluation (Addendum)
Physical Therapy Evaluation Patient Details Name: Anthony Chambers MRN: [id number] DOB: Jul 22, 1945 Today's Date: 06/05/2018   History of Present Illness  73 y.o. male with a known history of hypertension, hyperlipidemia, GERD, depression, schizophrenia who presents to the hospital due to shortness of breath and noted to be in acute respiratory failure with hypoxia and hypercapnia.  Clinical Impression  Pt showed good effort with mobility during exam and given his lethargy and limitations for the last few days he did relatively well with bed mobility, scooting sideways in bed and attempt to standing.  He has R knee ROM limitations and L hip pain but had reasonable strength in LEs otherwise.  He was able to attempt standing on multiple efforts, but with each attempt he had some buckling in b/l LEs (appeared R knee and L hip mostly limited).  Pt's O2 remained in the high 80s low 90s t/o the effort with face tent donned.      Follow Up Recommendations SNF(PT at long term facility)    Equipment Recommendations  None recommended by PT    Recommendations for Other Services       Precautions / Restrictions Precautions Precautions: Fall Restrictions Weight Bearing Restrictions: No      Mobility  Bed Mobility Overal bed mobility: Needs Assistance Bed Mobility: Supine to Sit;Sit to Supine     Supine to sit: Mod assist Sit to supine: Min guard   General bed mobility comments: Pt was able to assist with getting LEs to EOB, needed considerable HHA and assist to lift torso to sitting.  Pt did surprisingly well in side scooting at EOB and getting back to supine  Transfers Overall transfer level: Needs assistance Equipment used: Rolling walker (2 wheeled) Transfers: Sit to/from Stand Sit to Stand: Mod assist         General transfer comment: Multiple standing efforts that each ended in quickly needing to sit back down secondary to R knee and L hip buckling.  Pt with good initial effort to  attain standing but unable to maintain for any significant period of time.    Ambulation/Gait             General Gait Details: unable, unsafe secondary to buckling  Stairs            Wheelchair Mobility    Modified Rankin (Stroke Patients Only)       Balance Overall balance assessment: Needs assistance Sitting-balance support: Bilateral upper extremity supported Sitting balance-Leahy Scale: Good Sitting balance - Comments: Pt able to maintain sitting balance relatively well     Standing balance-Leahy Scale: Zero Standing balance comment: Pt showed great effort on multiple standing attempts, but had too much buckling to maintain standing despite heavy walker use and assist from PT                             Pertinent Vitals/Pain Pain Assessment: (unrated L hip pain)    Home Living Family/patient expects to be discharged to:: Skilled nursing facility(long term [facility] resident)                      Prior Function Level of Independence: Independent               Hand Dominance        Extremity/Trunk Assessment   Upper Extremity Assessment Upper Extremity Assessment: Generalized weakness;Overall [facility] for tasks assessed    Lower Extremity Assessment Lower Extremity  Assessment: Generalized weakness(R knee lacks full ext, stiffness with 4-/5 strength t/o)       Communication   Communication: No difficulties  Cognition Arousal/Alertness: Lethargic   Overall Cognitive Status: History of cognitive impairments - at baseline                                 General Comments: Pt very sleepy on 2 attempts to see this AM, did wake enough during later session to reasonably participate      General Comments General comments (skin integrity, edema, etc.): spoke with rehab dept at [facility], apparently he was having PT until late April and walked >150 ft on his last day (walker and min assist)    Exercises     Assessment/Plan     PT Assessment Patient needs continued PT services  PT Problem List Decreased strength;Decreased range of motion;Decreased activity tolerance;Decreased balance;Decreased mobility;Decreased coordination;Decreased cognition;Decreased knowledge of use of DME;Decreased safety awareness       PT Treatment Interventions DME instruction;Gait training;Functional mobility training;Therapeutic activities;Therapeutic exercise;Balance training;Neuromuscular re-education;Cognitive remediation;Patient/family education    PT Goals (Current goals can be found in the Care Plan section)  Acute Rehab PT Goals Patient Stated Goal: get back to walking well PT Goal Formulation: With patient Time For Goal Achievement: 06/19/18 Potential to Achieve Goals: Fair    Frequency Min 2X/week   Barriers to discharge        Co-evaluation               AM-PAC PT "6 Clicks" Mobility  Outcome Measure Help needed turning from your back to your side while in a flat bed without using bedrails?: A Little Help needed moving from lying on your back to sitting on the side of a flat bed without using bedrails?: A Lot Help needed moving to and from a bed to a chair (including a wheelchair)?: A Lot Help needed standing up from a chair using your arms (e.g., wheelchair or bedside chair)?: A Lot Help needed to walk in hospital room?: Total Help needed climbing 3-5 steps with a railing? : Total 6 Click Score: 11    End of Session Equipment Utilized During Treatment: Gait belt;Oxygen(face tent) Activity Tolerance: Patient limited by fatigue;Patient tolerated treatment well Patient left: with bed alarm set;with call bell/phone within reach Nurse Communication: Mobility status PT Visit Diagnosis: Muscle weakness (generalized) (M62.81);Difficulty in walking, not elsewhere classified (R26.2);Unsteadiness on feet (R26.81)    Time: [id number]  PT Time Calculation (min) (ACUTE ONLY): 28 min   Charges:   PT  Evaluation $PT Eval Low Complexity: 1 Low PT Treatments $Therapeutic Activity: 8-22 mins        Malachi ProGalen R Lenox Bink, DPT 06/05/2018, 3:39 PM

## 2018-06-05 NOTE — Progress Notes (Addendum)
[facility] - [place] at [facility]   PATIENT NAME: Anthony EdwardsDanny Chambers    MR#:  [id number]  DATE OF BIRTH:  11/20/1945  SUBJECTIVE:   Chief Complaint  Patient presents with  . Shortness of Breath    Patient is seen at the bedside. He is more awake and alert today. He report that breathing is much better. Overall he feels his condition is gradually improving. Voices no new complaints. Per nursing staff, no new episode of Vtach (17 beats) reported.  REVIEW OF SYSTEMS:  Review of Systems  Constitutional: Negative for chills, fever, malaise/fatigue and weight loss.  HENT: Negative for congestion, hearing loss and sore throat.   Eyes: Negative for blurred vision and double vision.  Respiratory: Positive for shortness of breath. Negative for cough and wheezing.   Cardiovascular: Positive for leg swelling. Negative for chest pain, palpitations and orthopnea.  Gastrointestinal: Negative for abdominal pain, diarrhea, nausea and vomiting.  Genitourinary: Negative for dysuria and urgency.  Musculoskeletal: Negative for myalgias.  Skin: Negative for rash.  Neurological: Negative for dizziness, sensory change, speech change, focal weakness and headaches.  Psychiatric/Behavioral: Negative for depression.   DRUG ALLERGIES:   Allergies  Allergen Reactions  . Heparin    VITALS:  Blood pressure 129/73, pulse 71, temperature 98.4 F (36.9 C), temperature source Oral, resp. rate 18, height 6\' 3"  (1.905 m), weight 115.7 kg, SpO2 91 %. PHYSICAL EXAMINATION:   GENERAL:  73 y.o.-year-old patient lying in the bed with no acute distress.  EYES: Pupils equal, round, reactive to light and accommodation. No scleral icterus. Extraocular muscles intact.  HEENT: Head atraumatic, normocephalic. Oropharynx and nasopharynx clear.  NECK:  Supple, no jugular venous distention. No thyroid enlargement, no tenderness.  LUNGS: Normal breath sounds bilaterally, no wheezing, rales,rhonchi or  crepitation. No use of accessory muscles of respiration.  CARDIOVASCULAR: S1, S2 normal. No murmurs, rubs, or gallops.  ABDOMEN: Soft, nontender, nondistended. Bowel sounds present. No organomegaly or mass.  EXTREMITIES: Bilateral pitting edema of legs.No cyanosis, or clubbing.  NEUROLOGIC: Cranial nerves II through XII are intact. Muscle strength 5/5 in all extremities. Sensation intact. Gait not checked.  PSYCHIATRIC: The patient is asleep difficult to arouse SKIN: No obvious rash, lesion, or ulcer.   DATA REVIEWED:  LABORATORY PANEL:  Male CBC Recent Labs  Lab 06/02/18 0508  WBC 5.3  HGB 12.9*  HCT 43.7  PLT 184   ------------------------------------------------------------------------------------------------------------------ Chemistries  Recent Labs  Lab 06/04/18 0306 06/05/18 0312  NA 140 141  K 4.3 3.8  CL 93* 93*  CO2 39* 38*  GLUCOSE 111* 97  BUN 24* 33*  CREATININE 1.18 1.31*  CALCIUM 8.1* 8.4*  MG 2.2  --    RADIOLOGY:  Dg Chest Port 1 View  Result Date: 06/05/2018 CLINICAL DATA:  Short of breath EXAM: PORTABLE CHEST 1 VIEW COMPARISON:  06/01/2018 FINDINGS: Bibasilar airspace disease unchanged. No effusion. Elevated right hemidiaphragm. IMPRESSION: Stable bibasilar infiltrates. Electronically Signed   By: Marlan Palauharles  Clark M.D.   On: 06/05/2018 09:14   ASSESSMENT AND PLAN:   73 y.o. male with a known history of hypertension, hyperlipidemia, GERD, depression, schizophrenia who presents to the hospital due to shortness of breath and noted to be in acute respiratory failure with hypoxia and hypercapnia.  1. Acute respiratory failure with hypoxia and hypercapnia- suspected to be secondary to underlying CHF with underlying COPD. - Significant improvement noted today - Wean oxygen as tolerated - Continue scheduled DuoNeb's - Continue nocturnal BIPAP - Encouraged to  work with PT  2. Acute decompensated Diastolic Congestive Heart Failure- Likely volume  overload - Last Echo 06/02/18. Normal LVF with LVEF of 55-60% - Beta-Blockade:  Metoprolol - Diuretics: Furosemide 40 PO BID, continue to monitor renal functions - Low salt diet - Strict I&O - 1200cc fluid restriction/ 24 hours - Cardiology consult for episode of V. tach yesterday  3. Acute on chronic COPD Exacerbation - Oxygenation improved. - Repeat chest x-ray per pulmonary today shows significant atelectasis worse on the LLB - Supplemental O2, goal sat 88-92% wean as tolerated - DuoNEb and pulmicort nebulizer therapy  - No risk factors for Pseudomonas (recent hospitalization within 90 days, frequent antibiotics >4 courses/year, severe COPD, prior documented Pseudomonas) or persistent symptoms in spite of empiric antibiotics.) - Incentive spirometry/vest therapy - Pulmonary consult appreciated  4. History of atrial fibrillation-rate controlled.  - Continue Toprol, continue Xarelto.  5. Hyperlipidemia-continue Zocor.  6. History of schizophrenia/depression  - Continue Risperdal, Zyprexa.  7. GERD - stable  - Continue Protonix.  All the records are reviewed and case discussed with Care Management/Social Worker. Management plans discussed with the patient, family and they are in agreement.  CODE STATUS: Full Code  TOTAL TIME TAKING CARE OF THIS PATIENT: 38 minutes.   More than 50% of the time was spent in counseling/coordination of care: YES  POSSIBLE D/C IN 1-2 DAYS, DEPENDING ON CLINICAL CONDITION.   on 06/05/2018 at 12:15 PM  This patient was staffed with Dr.Konidena, Madelyn Flavors who personally evaluated patient, reviewed documentation and agreed with assessment and plan of care as above.  Webb Silversmith, DNP, FNP-BC [facility] Nurse Practitioner   Between 7am to 6pm - Pager [phone]  After 6pm go to [email or web address] - Social research officer, government  [facility]  Office  [phone]  CC: Primary care physician; Dorothey Baseman, MD  Note: This dictation was prepared with Dragon dictation along with smaller phrase technology. Any transcriptional errors that result from this process are unintentional.

## 2018-06-05 NOTE — Progress Notes (Signed)
Patient's sister called for an update.  There is no documented password and the only contact listed, [name], isn't listed as his sister.  Patient said he had a sister named Anthony Chambers.  I asked him if I could speak to Anthony Chambers about his medical care.  I then explained to him that that meant I could take to her about his medications, lab value, VS,. Etc.  He gave permission that I could talk with her.   I then called Anthony Chambers on the phone number listed in the chart and gave her an update on how he is doing.  She was very grateful for the information.

## 2018-06-05 NOTE — Progress Notes (Signed)
Alerted by CCMD that patient is having very frequent PVCs - going in and out of Trigeminy.  Confirmed this on tele screen.  Dr. Luberta Mutter notified.  BP 110/62. HR 84.  Sats 89%.  No c/o symptoms.

## 2018-06-05 NOTE — Progress Notes (Signed)
Pulmonary Medicine          Date: 06/05/2018,   MRN# [id number] Anthony BastDanny C Chambers 12/24/1945     AdmissionWeight: 122.7 kg                 CurrentWeight: 115.7 kg       SUBJECTIVE    Patient is resting in bed, he is loudly snoring and is a mouth breather which may be falsely giving us increased O2 requirement.  X-ray this morning was reviewed independently by me there is significant atelectasis worse on the left lower lobe.  I had discussed with respiratory therapist and will work on this today.   PAST MEDICAL HISTORY   Past Medical History:  Diagnosis Date  . Depression   . GERD (gastroesophageal reflux disease)   . Hyperlipidemia   . Hypertension   . Schizophrenia (HCC)   . Total occlusion of coronary artery, chronic      SURGICAL HISTORY   History reviewed. No pertinent surgical history.   FAMILY HISTORY   Family History  Problem Relation Age of Onset  . Asthma Mother      SOCIAL HISTORY   Social History   Tobacco Use  . Smoking status: Former Smoker    Years: 40.00    Types: Cigarettes  . Smokeless tobacco: Never Used  Substance Use Topics  . Alcohol use: Not Currently  . Drug use: Never     MEDICATIONS    Home Medication:    Current Medication:  Current Facility-Administered Medications:  .  acetaminophen (TYLENOL) tablet 650 mg, 650 mg, Oral, Q6H PRN **OR** acetaminophen (TYLENOL) suppository 650 mg, 650 mg, Rectal, Q6H PRN, Sainani, Vivek J, MD .  albuterol (PROVENTIL) (2.5 MG/3ML) 0.083% nebulizer solution 2.5 mg, 2.5 mg, Nebulization, Q2H PRN, Katha HammingKonidena, Snehalatha, MD .  budesonide (PULMICORT) nebulizer solution 0.25 mg, 0.25 mg, Nebulization, BID, Karna ChristmasAleskerov, Laverne Hursey, MD, 0.25 mg at 06/05/18 0752 .  furosemide (LASIX) injection 40 mg, 40 mg, Intravenous, Q12H, Vida RiggerAleskerov, Quintavia Rogstad, MD, 40 mg at 06/04/18 2011 .  guaiFENesin (ROBITUSSIN) 100 MG/5ML solution 200 mg, 10 mL, Oral, Q6H PRN, Sainani, Vivek J, MD .  ipratropium-albuterol  (DUONEB) 0.5-2.5 (3) MG/3ML nebulizer solution 3 mL, 3 mL, Nebulization, Q6H, Katha HammingKonidena, Snehalatha, MD .  MEDLINE mouth rinse, 15 mL, Mouth Rinse, BID, Auburn BilberryPatel, Shreyang, MD, 15 mL at 06/04/18 2142 .  metoprolol succinate (TOPROL-XL) 24 hr tablet 25 mg, 25 mg, Oral, Daily, Sainani, Rolly PancakeVivek J, MD, 25 mg at 06/04/18 0827 .  OLANZapine (ZYPREXA) tablet 30 mg, 30 mg, Oral, Daily, [place] SirenSainani, Vivek J, MD, 30 mg at 06/04/18 2141 .  ondansetron (ZOFRAN) tablet 4 mg, 4 mg, Oral, Q6H PRN **OR** ondansetron (ZOFRAN) injection 4 mg, 4 mg, Intravenous, Q6H PRN, Sainani, Vivek J, MD .  pantoprazole (PROTONIX) EC tablet 40 mg, 40 mg, Oral, Daily, [place] SirenSainani, Vivek J, MD, 40 mg at 06/04/18 0826 .  PARoxetine (PAXIL-CR) 24 hr tablet 25 mg, 25 mg, Oral, Daily, Sainani, Rolly PancakeVivek J, MD, 25 mg at 06/04/18 0827 .  risperiDONE (RISPERDAL) tablet 2 mg, 2 mg, Oral, BID, [place] SirenSainani, Vivek J, MD, 2 mg at 06/04/18 2141 .  rivaroxaban (XARELTO) tablet 20 mg, 20 mg, Oral, Daily, [place] SirenSainani, Vivek J, MD, 20 mg at 06/04/18 0826 .  senna-docusate (Senokot-S) tablet 2 tablet, 2 tablet, Oral, BID, Delfino LovettShah, Vipul, MD, 2 tablet at 06/04/18 2141 .  simvastatin (ZOCOR) tablet 20 mg, 20 mg, Oral, QHS, Sainani, Rolly PancakeVivek J, MD, 20 mg at 06/04/18 2141 .  vitamin  B-12 (CYANOCOBALAMIN) tablet 1,000 mcg, 1,000 mcg, Oral, Daily, Sainani, Vivek J, MD, 1,000 mcg at 06/04/18 1610    ALLERGIES   Heparin     REVIEW OF SYSTEMS    Review of Systems:  Gen:  Denies  fever, sweats, chills weigh loss  HEENT: Denies blurred vision, double vision, ear pain, eye pain, hearing loss, nose bleeds, sore throat Cardiac:  No dizziness, chest pain or heaviness, chest tightness,edema Resp:   Denies SOB at rest   Gi: Denies swallowing difficulty, stomach pain, nausea or vomiting, diarrhea, constipation, bowel incontinence Gu:  Denies bladder incontinence, burning urine Ext:   Denies Joint pain, stiffness or swelling Skin: Denies  skin rash, easy bruising or bleeding or  hives Endoc:  Denies polyuria, polydipsia , polyphagia or weight change Psych:   Denies depression, insomnia or hallucinations   Other:  All other systems negative   VS: BP 129/73 (BP Location: Right Arm)   Pulse 71   Temp 98.4 F (36.9 C) (Oral)   Resp 18   Ht  (1.905 m)   Wt 115.7 kg   SpO2 91%   BMI 31.87 kg/m      PHYSICAL EXAM    GENERAL:NAD, no fevers, chills, no weakness no fatigue HEAD: Normocephalic, atraumatic.  EYES: Pupils equal, round, reactive to light. Extraocular muscles intact. No scleral icterus.  MOUTH: Moist mucosal membrane. Dentition intact. No abscess noted.  EAR, NOSE, THROAT: Clear without exudates. No external lesions.  NECK: Supple. No thyromegaly. No nodules. No JVD.  PULMONARY: Decreased breath sounds bilaterally worse on the left with mild crackles at the left base CARDIOVASCULAR: S1 and S2. Regular rate and rhythm. No murmurs, rubs, or gallops. No edema. Pedal pulses 2+ bilaterally.  GASTROINTESTINAL: Soft, nontender, nondistended. No masses. Positive bowel sounds. No hepatosplenomegaly.  MUSCULOSKELETAL: No swelling, clubbing, or edema. Range of motion full in all extremities.  NEUROLOGIC: Cranial nerves II through XII are intact. No gross focal neurological deficits. Sensation intact. Reflexes intact.  SKIN: No ulceration, lesions, rashes, or cyanosis. Skin warm and dry. Turgor intact.  PSYCHIATRIC: Mood, affect within normal limits. The patient is awake, alert and oriented x 3. Insight, judgment intact.       IMAGING    Dg Chest Port 1 View  Result Date: 06/05/2018 CLINICAL DATA:  Short of breath EXAM: PORTABLE CHEST 1 VIEW COMPARISON:  06/01/2018 FINDINGS: Bibasilar airspace disease unchanged. No effusion. Elevated right hemidiaphragm. IMPRESSION: Stable bibasilar infiltrates. Electronically Signed   By: Marlan Palau M.D.   On: 06/05/2018 09:14   Dg Chest Port 1 View  Result Date: 06/01/2018 CLINICAL DATA:  Worsening shortness  of breath since yesterday EXAM: PORTABLE CHEST 1 VIEW COMPARISON:  None. FINDINGS: Cardiomegaly. Coarse interstitial markings bilaterally indicating interstitial edema or chronic interstitial lung disease. No confluent opacity to suggest a consolidating pneumonia. No pleural effusion or pneumothorax seen. Elevation of the RIGHT hemidiaphragm, of uncertain chronicity. Osseous structures about the chest are unremarkable. IMPRESSION: 1. Cardiomegaly. 2. Coarse interstitial markings bilaterally, compatible with CHF and/or chronic interstitial lung disease. 3. No evidence of consolidating pneumonia seen. Electronically Signed   By: Bary Richard M.D.   On: 06/01/2018 16:25      ASSESSMENT/PLAN     Acute hypoxemic and hypercarbic respiratory failure -Likely due to acute decompensated diastolic CHF with EF >55% - Evidenced by BNP elevation, CXR with CPA blunting and cephalization suggestive of interstitial edema, pitting LE edema and increased O2 requirement, complicated by atelectasis - Currently net 6L negative,  able to tolerate diuresis well will increase to 40 BID from 20 BID - stict I&Os, And 1200cc fluid restriction/ 24 hours - Noctural BIPAP to help with hypcapnia which may worsen as we diurese      COPD - likely partially responsible for hypercapnia as pH is normal with pCO2 68 - would start on DuoNEb and pulmicort nebulizer therapy  - no need for prednisone as patient does not have rhonchi/wheezing and is not coughing up thick plegm nor complaining of increased volume of respiratory secrtions     Atelectasis  - incentive spirometery - chest physiotherapy - patient would benefit from VEST therapy - PT to ambulate /ROM exercise     Thank you for allowing me to participate in the care of this patient.    Patient/Family are satisfied with care plan and all questions have been answered.  This document was prepared using Dragon  voice recognition software and may include unintentional dictation errors.     Vida Rigger, M.D.  Division of Pulmonary & Critical Care Medicine  [facility]

## 2018-06-05 NOTE — Plan of Care (Signed)
Pt receiving IV lasix with good output, on 4L acute O2.  Problem: Nutrition: Goal: Adequate nutrition will be maintained Outcome: Progressing   Problem: Coping: Goal: Level of anxiety will decrease Outcome: Progressing   Problem: Elimination: Goal: Will not experience complications related to urinary retention Outcome: Progressing   Problem: Pain Managment: Goal: General experience of comfort will improve Outcome: Progressing Note:  No complaints of pain this shift   Problem: Safety: Goal: Ability to remain free from injury will improve Outcome: Progressing

## 2018-06-05 NOTE — Progress Notes (Signed)
PT Cancellation Note  Patient Details Name: Anthony Chambers MRN: [id number] DOB: 01/04/1946   Cancelled Treatment:    Reason Eval/Treat Not Completed: Fatigue/lethargy limiting ability to participate;Patient's level of consciousness Attempted to see pt x2 this AM, initially attempt he did not open eyes or show any acknowledgement of anyone's presence despite loudly calling his name, shoulder/sternal/leg rubs.  On second attempt he did briefly open his eyes and repeated "How do I feel" when asked how he was feeling today, however his eyes quickly closed and despite repeated efforts to wake him he showed no further response.  Will try back again this date as appropriate.    Malachi Pro, DPT 06/05/2018, 10:11 AM

## 2018-06-05 NOTE — Progress Notes (Signed)
Again having very frequent PVCs.  Bigeminy and trigeminy. Dr. Luberta Mutter notified.  Will check his serum potassium and magnesium.

## 2018-06-06 ENCOUNTER — Inpatient Hospital Stay: Payer: Medicare Other

## 2018-06-06 LAB — BASIC METABOLIC PANEL
Anion gap: 10 (ref 5–15)
Anion gap: 10 (ref 5–15)
BUN: 39 mg/dL — ABNORMAL HIGH (ref 8–23)
BUN: 33 mg/dL — ABNORMAL HIGH (ref 8–23)
CO2: 36 mmol/L — ABNORMAL HIGH (ref 22–32)
CO2: 38 mmol/L — ABNORMAL HIGH (ref 22–32)
Calcium: 8.4 mg/dL — ABNORMAL LOW (ref 8.9–10.3)
Calcium: 8.4 mg/dL — ABNORMAL LOW (ref 8.9–10.3)
Chloride: 96 mmol/L — ABNORMAL LOW (ref 98–111)
Chloride: 92 mmol/L — ABNORMAL LOW (ref 98–111)
Creatinine, Ser: 1.12 mg/dL (ref 0.61–1.24)
Creatinine, Ser: 1.17 mg/dL (ref 0.61–1.24)
GFR calc Af Amer: >60 mL/min (ref >60)
GFR calc Af Amer: >60 mL/min (ref >60)
GFR calc non Af Amer: >60 mL/min (ref >60)
GFR calc non Af Amer: >60 mL/min (ref >60)
Glucose, Bld: 102 mg/dL — ABNORMAL HIGH (ref 70–99)
Glucose, Bld: 98 mg/dL (ref 70–99)
Potassium: 3.7 mmol/L (ref 3.5–5.1)
Potassium: 4.4 mmol/L (ref 3.5–5.1)
Sodium: 142 mmol/L (ref 135–145)
Sodium: 140 mmol/L (ref 135–145)

## 2018-06-06 LAB — GLUCOSE, CAPILLARY: Glucose-Capillary: 79 mg/dL (ref 70–99)

## 2018-06-06 LAB — HEPATIC FUNCTION PANEL
ALT: 6 U/L (ref 0–44)
AST: 12 U/L — ABNORMAL LOW (ref 15–41)
Albumin: 2.9 g/dL — ABNORMAL LOW (ref 3.5–5.0)
Alkaline Phosphatase: 80 U/L (ref 38–126)
Bilirubin, Direct: 0.1 mg/dL (ref 0.0–0.2)
Indirect Bilirubin: 0.5 mg/dL (ref 0.3–0.9)
Total Bilirubin: 0.6 mg/dL (ref 0.3–1.2)
Total Protein: 6.6 g/dL (ref 6.5–8.1)

## 2018-06-06 LAB — CBC
HCT: 43.3 % (ref 39.0–52.0)
Hemoglobin: 13 g/dL (ref 13.0–17.0)
MCH: 25 pg — ABNORMAL LOW (ref 26.0–34.0)
MCHC: 30 g/dL (ref 30.0–36.0)
MCV: 83.4 fL (ref 80.0–100.0)
Platelets: 167 10*3/uL (ref 150–400)
RBC: 5.19 MIL/uL (ref 4.22–5.81)
RDW: 14.6 % (ref 11.5–15.5)
WBC: 5.1 10*3/uL (ref 4.0–10.5)
nRBC: 0 % (ref 0.0–0.2)

## 2018-06-06 LAB — CULTURE, BLOOD (ROUTINE X 2)
Culture: NO GROWTH
Special Requests: ADEQUATE

## 2018-06-06 LAB — BLOOD GAS, ARTERIAL
Acid-Base Excess: 18.7 mmol/L — ABNORMAL HIGH (ref 0.0–2.0)
Bicarbonate: 47.4 mmol/L — ABNORMAL HIGH (ref 20.0–28.0)
FIO2: 60
O2 Saturation: 91.4 %
Patient temperature: 37
pCO2 arterial: 73 mmHg (ref 32.0–48.0)
pH, Arterial: 7.42 (ref 7.350–7.450)
pO2, Arterial: 61 mmHg — ABNORMAL LOW (ref 83.0–108.0)

## 2018-06-06 MED ORDER — ORAL CARE MOUTH RINSE
15.0000 mL | Freq: Two times a day (BID) | OROMUCOSAL | Status: DC
  Administered 2018-06-06 – 2018-06-09 (×5): 15 mL via OROMUCOSAL

## 2018-06-06 MED ORDER — OLANZAPINE 7.5 MG PO TABS
15.0000 mg | ORAL_TABLET | Freq: Every day | ORAL | Status: DC
  Administered 2018-06-06 – 2018-06-08 (×2): 15 mg via ORAL
  Filled 2018-06-06 (×4): qty 2

## 2018-06-06 MED ORDER — CHLORHEXIDINE GLUCONATE 0.12 % MT SOLN
15.0000 mL | Freq: Two times a day (BID) | OROMUCOSAL | Status: DC
  Administered 2018-06-07 – 2018-06-09 (×5): 15 mL via OROMUCOSAL
  Filled 2018-06-06 (×4): qty 15

## 2018-06-06 NOTE — Progress Notes (Signed)
Pulmonary Medicine          Date: 06/06/2018,   MRN# [id number] Anthony Chambers 07-01-1945     AdmissionWeight: 122.7 kg                 CurrentWeight: 112.9 kg      SUBJECTIVE    Patient is snorring and is arousable but seems to have worsened mentation.  Will check ABG for hypercapnia as patient is already with dementia at baseline.     PAST MEDICAL HISTORY   Past Medical History:  Diagnosis Date  . Depression   . GERD (gastroesophageal reflux disease)   . Hyperlipidemia   . Hypertension   . Schizophrenia (HCC)   . Total occlusion of coronary artery, chronic      SURGICAL HISTORY   History reviewed. No pertinent surgical history.   FAMILY HISTORY   Family History  Problem Relation Age of Onset  . Asthma Mother      SOCIAL HISTORY   Social History   Tobacco Use  . Smoking status: Former Smoker    Years: 40.00    Types: Cigarettes  . Smokeless tobacco: Never Used  Substance Use Topics  . Alcohol use: Not Currently  . Drug use: Never     MEDICATIONS    Home Medication:    Current Medication:  Current Facility-Administered Medications:  .  acetaminophen (TYLENOL) tablet 650 mg, 650 mg, Oral, Q6H PRN **OR** acetaminophen (TYLENOL) suppository 650 mg, 650 mg, Rectal, Q6H PRN, Sainani, Vivek J, MD .  albuterol (PROVENTIL) (2.5 MG/3ML) 0.083% nebulizer solution 2.5 mg, 2.5 mg, Nebulization, Q2H PRN, Katha Hamming, MD .  budesonide (PULMICORT) nebulizer solution 0.25 mg, 0.25 mg, Nebulization, BID, Karna Christmas, Levante Simones, MD, 0.25 mg at 06/06/18 0732 .  furosemide (LASIX) injection 40 mg, 40 mg, Intravenous, Daily, Luberta Mutter, Snehalatha, MD .  guaiFENesin (ROBITUSSIN) 100 MG/5ML solution 200 mg, 10 mL, Oral, Q6H PRN, Sainani, Vivek J, MD .  ipratropium-albuterol (DUONEB) 0.5-2.5 (3) MG/3ML nebulizer solution 3 mL, 3 mL, Nebulization, Q6H, Katha Hamming, MD, 3 mL at 06/06/18 0732 .  MEDLINE mouth rinse, 15 mL, Mouth Rinse, BID, Auburn Bilberry, MD, 15 mL at 06/05/18 2106 .  metoprolol succinate (TOPROL-XL) 24 hr tablet 25 mg, 25 mg, Oral, Daily, Sainani, Rolly Pancake, MD, 25 mg at 06/05/18 1040 .  OLANZapine (ZYPREXA) tablet 30 mg, 30 mg, Oral, Daily, Houston Siren, MD, 30 mg at 06/05/18 2106 .  ondansetron (ZOFRAN) tablet 4 mg, 4 mg, Oral, Q6H PRN **OR** ondansetron (ZOFRAN) injection 4 mg, 4 mg, Intravenous, Q6H PRN, Sainani, Vivek J, MD .  pantoprazole (PROTONIX) EC tablet 40 mg, 40 mg, Oral, Daily, Sainani, Rolly Pancake, MD, 40 mg at 06/05/18 1040 .  PARoxetine (PAXIL-CR) 24 hr tablet 25 mg, 25 mg, Oral, Daily, Sainani, Rolly Pancake, MD, 25 mg at 06/05/18 1040 .  risperiDONE (RISPERDAL) tablet 2 mg, 2 mg, Oral, BID, Houston Siren, MD, 2 mg at 06/05/18 2106 .  rivaroxaban (XARELTO) tablet 20 mg, 20 mg, Oral, Daily, Sainani, Rolly Pancake, MD, 20 mg at 06/05/18 1040 .  senna-docusate (Senokot-S) tablet 2 tablet, 2 tablet, Oral, BID, Delfino Lovett, MD, 2 tablet at 06/05/18 2106 .  simvastatin (ZOCOR) tablet 20 mg, 20 mg, Oral, QHS, Sainani, Rolly Pancake, MD, 20 mg at 06/05/18 2106 .  sodium chloride flush (NS) 0.9 % injection 3 mL, 3 mL, Intravenous, Q12H, Katha Hamming, MD, 3 mL at 06/05/18 2106 .  sodium chloride flush (NS) 0.9 %  injection 3 mL, 3 mL, Intravenous, PRN, Katha HammingKonidena, Snehalatha, MD .  vitamin B-12 (CYANOCOBALAMIN) tablet 1,000 mcg, 1,000 mcg, Oral, Daily, Sainani, Vivek J, MD, 1,000 mcg at 06/05/18 1040    ALLERGIES   Heparin     REVIEW OF SYSTEMS    Review of Systems:  Gen:  Denies  fever, sweats, chills weigh loss  HEENT: Denies blurred vision, double vision, ear pain, eye pain, hearing loss, nose bleeds, sore throat Cardiac:  No dizziness, chest pain or heaviness, chest tightness,edema Resp:   Denies cough or sputum porduction, shortness of breath,wheezing, hemoptysis,  Gi: Denies swallowing difficulty, stomach pain, nausea or vomiting, diarrhea, constipation, bowel incontinence Gu:  Denies bladder  incontinence, burning urine Ext:   Denies Joint pain, stiffness or swelling Skin: Denies  skin rash, easy bruising or bleeding or hives Endoc:  Denies polyuria, polydipsia , polyphagia or weight change Psych:   Denies depression, insomnia or hallucinations   Other:  All other systems negative   VS: BP 125/64 (BP Location: Left Arm)   Pulse 67   Temp 98.7 F (37.1 C) (Oral)   Resp 16   Ht 6\' 3"  (1.905 m)   Wt 112.9 kg   SpO2 (!) 89%   BMI 31.11 kg/m      PHYSICAL EXAM    GENERAL:NAD, no fevers, chills, no weakness no fatigue HEAD: Normocephalic, atraumatic.  EYES: Pupils equal, round, reactive to light. Extraocular muscles intact. No scleral icterus.  MOUTH: Moist mucosal membrane. Dentition intact. No abscess noted.  EAR, NOSE, THROAT: Clear without exudates. No external lesions.  NECK: Supple. No thyromegaly. No nodules. No JVD.  PULMONARY: Bilateral bibasilar crackles CARDIOVASCULAR: S1 and S2. Regular rate and rhythm. No murmurs, rubs, or gallops. No edema. Pedal pulses 2+ bilaterally.  GASTROINTESTINAL: Soft, nontender, nondistended. No masses. Positive bowel sounds. No hepatosplenomegaly.  MUSCULOSKELETAL: No swelling, clubbing, or edema. Range of motion full in all extremities.  NEUROLOGIC: Cranial nerves II through XII are intact. No gross focal neurological deficits. Sensation intact. Reflexes intact.  SKIN: No ulceration, lesions, rashes, or cyanosis. Skin warm and dry. Turgor intact.  PSYCHIATRIC: Mood, affect within normal limits. The patient is awake, alert and oriented x 3. Insight, judgment intact.       IMAGING    Dg Chest Port 1 View  Result Date: 06/05/2018 CLINICAL DATA:  Short of breath EXAM: PORTABLE CHEST 1 VIEW COMPARISON:  06/01/2018 FINDINGS: Bibasilar airspace disease unchanged. No effusion. Elevated right hemidiaphragm. IMPRESSION: Stable bibasilar infiltrates. Electronically Signed   By: Marlan Palauharles  Clark M.D.   On: 06/05/2018 09:14   Dg Chest  Port 1 View  Result Date: 06/01/2018 CLINICAL DATA:  Worsening shortness of breath since yesterday EXAM: PORTABLE CHEST 1 VIEW COMPARISON:  None. FINDINGS: Cardiomegaly. Coarse interstitial markings bilaterally indicating interstitial edema or chronic interstitial lung disease. No confluent opacity to suggest a consolidating pneumonia. No pleural effusion or pneumothorax seen. Elevation of the RIGHT hemidiaphragm, of uncertain chronicity. Osseous structures about the chest are unremarkable. IMPRESSION: 1. Cardiomegaly. 2. Coarse interstitial markings bilaterally, compatible with CHF and/or chronic interstitial lung disease. 3. No evidence of consolidating pneumonia seen. Electronically Signed   By: Bary RichardStan  Maynard M.D.   On: 06/01/2018 16:25      ASSESSMENT/PLAN   Acute hypoxemic and hypercarbic respiratory failure -Likely due to acute decompensated diastolic CHF with EF >55% - Evidenced by BNP elevation, CXR with CPA blunting and cephalization suggestive of interstitial edema, pitting LE edema and increased O2 requirement, complicated by atelectasis -  Currently net6Lnegative, able to tolerate diuresis well will increase to 40 BID from 20 BID - stict I&Os, And 1200cc fluid restriction/ 24 hours - Noctural BIPAP to help with hypcapnia  -reviewed ABG - pH normal with worsening hypercapnia       Discussed with Dr Luberta Mutter and NP Anna Genre - will upgrade to SDU for BIPAP    COPD - likely partially responsible for hypercapnia as pH is normal with pCO2 68 - would start on DuoNEb and pulmicort nebulizer therapy  - no need for prednisone as patient does not have rhonchi/wheezing and is not coughing up thick plegm nor complaining of increased volume of respiratory secrtions     Atelectasis  - incentive spirometery - chest physiotherapy - patient would benefit from VEST therapy - PT to ambulate /ROM exercise    Thank you for allowing me to  participate in the care of this patient.   Patient/Family are satisfied with care plan and all questions have been answered.  This document was prepared using Dragon voice recognition software and may include unintentional dictation errors.       Thank you for allowing me to participate in the care of this patient.   Patient/Family are satisfied with care plan and all questions have been answered.  This document was prepared using Dragon voice recognition software and may include unintentional dictation errors.     Vida Rigger, M.D.  Division of Pulmonary & Critical Care Medicine  [facility]

## 2018-06-06 NOTE — Progress Notes (Signed)
[facility] - [place] at [facility]   PATIENT NAME: Anthony Chambers    MR#:  [id number]  DATE OF BIRTH:  02-20-1945  SUBJECTIVE:   Chief Complaint  Patient presents with  . Shortness of Breath   Patient is seen at the bedside. He is very difficult to arouse and only opens his eye to deep sternal rub. He is able to mumble some words but drifts back to sleep. He appears lethargic and less alert today on exam.  Per nursing staff patient was more lethargic today, on face tent. Also noted with frequent PVCs and bigeminy cardiology previously consulted for this.  REVIEW OF SYSTEMS:  Review of Systems  Constitutional: Negative for chills, fever, malaise/fatigue and weight loss.  HENT: Negative for congestion, hearing loss and sore throat.   Eyes: Negative for blurred vision and double vision.  Respiratory: Positive for shortness of breath. Negative for cough and wheezing.   Cardiovascular: Positive for leg swelling. Negative for chest pain, palpitations and orthopnea.  Gastrointestinal: Negative for abdominal pain, diarrhea, nausea and vomiting.  Genitourinary: Negative for dysuria and urgency.  Musculoskeletal: Negative for myalgias.  Skin: Negative for rash.  Neurological: Negative for dizziness, sensory change, speech change, focal weakness and headaches.  Psychiatric/Behavioral: Negative for depression.   DRUG ALLERGIES:   Allergies  Allergen Reactions  . Heparin    VITALS:  Blood pressure 125/64, pulse 67, temperature 98.7 F (37.1 C), temperature source Oral, resp. rate 16, height 6\' 3"  (1.905 m), weight 112.9 kg, SpO2 (!) 89 %. PHYSICAL EXAMINATION:   GENERAL:  73 y.o.-year-old patient lying in the bed with eyes closed.  EYES: Pupils equal, round, reactive to light and accommodation. No scleral icterus. Extraocular muscles intact.  HEENT: Head atraumatic, normocephalic. Oropharynx and nasopharynx clear.  NECK:  Supple, no jugular venous distention. No  thyroid enlargement, no tenderness.  LUNGS: Decreased breath sounds bilaterally, mild wheezing in posterior lower lobes. No rales,rhonchi or crepitation. No use of accessory muscles of respiration.  CARDIOVASCULAR: S1, S2 normal. No murmurs, rubs, or gallops.  ABDOMEN: Soft, nontender, nondistended. Bowel sounds present. No organomegaly or mass.  EXTREMITIES: Bilateral pitting edema of legs.No cyanosis, or clubbing.  NEUROLOGIC: Cranial nerves II through XII are intact. Muscle strength 5/5 in all extremities. Sensation intact. Gait not checked.  PSYCHIATRIC: The patient is asleep difficult to arouse SKIN: No obvious rash, lesion, or ulcer.   DATA REVIEWED:  LABORATORY PANEL:  Male CBC Recent Labs  Lab 06/06/18 0253  WBC 5.1  HGB 13.0  HCT 43.3  PLT 167   ------------------------------------------------------------------------------------------------------------------ Chemistries  Recent Labs  Lab 06/05/18 1735 06/06/18 0253  NA  --  142  K 4.2 3.7  CL  --  96*  CO2  --  36*  GLUCOSE  --  102*  BUN  --  39*  CREATININE  --  1.12  CALCIUM  --  8.4*  MG 2.1  --    RADIOLOGY:  Dg Chest Port 1 View  Result Date: 06/05/2018 CLINICAL DATA:  Short of breath EXAM: PORTABLE CHEST 1 VIEW COMPARISON:  06/01/2018 FINDINGS: Bibasilar airspace disease unchanged. No effusion. Elevated right hemidiaphragm. IMPRESSION: Stable bibasilar infiltrates. Electronically Signed   By: Marlan Palau M.D.   On: 06/05/2018 09:14   ASSESSMENT AND PLAN:   73 y.o. male with a known history of hypertension, hyperlipidemia, GERD, depression, schizophrenia who presents to the hospital due to shortness of breath and noted to be in acute respiratory failure with hypoxia and  hypercapnia.  1. Acute respiratory failure with hypoxia and hypercapnia- suspected to be secondary to underlying CHF with underlying COPD. - Initialy improved on nocturnal BiPAP, however noted to be lethargic and less responsive  today - ABGs this am shows hypercarbia  (pCO2 73) .  - Discussed with ICU team, will transfer to step down unit for management  2. Acute decompensated Diastolic Congestive Heart Failure- Likely volume overload - Last Echo 06/02/18. Normal LVF with LVEF of 55-60% - Beta-Blockade:  Metoprolol - Diuretics: Furosemide 40 PO BID, continue to monitor renal functions - Low salt diet - Strict I&O - 1200cc fluid restriction/ 24 hours - Cardiology consult pending for episode of V. tach yesterday  3. Acute on chronic COPD Exacerbation - Oxygenation improved. - Repeat chest x-ray  shows significant atelectasis worse on the LLB - Supplemental O2, goal sat 88-92% weaning as tolerated - DuoNEb and pulmicort nebulizer therapy  - No risk factors for Pseudomonas (recent hospitalization within 90 days, frequent antibiotics >4 courses/year, severe COPD, prior documented Pseudomonas) or persistent symptoms in spite of empiric antibiotics.) - Incentive spirometry/vest therapy - Pulmonary consult appreciated  4. History of atrial fibrillation-rate controlled.  - Continue Toprol, continue Xarelto.  5. Hyperlipidemia-continue Zocor.  6. History of schizophrenia/depression  - Continue Risperdal, Zyprexa.  7. GERD - stable  - Continue Protonix.  All the records are reviewed and case discussed with Care Management/Social Worker. Management plans discussed with the patient, family and they are in agreement.  CODE STATUS: Full Code  TOTAL TIME TAKING CARE OF THIS PATIENT: 38 minutes.   More than 50% of the time was spent in counseling/coordination of care: YES  POSSIBLE D/C IN 1-2 DAYS, DEPENDING ON CLINICAL CONDITION.   on 06/06/2018 at 10:23 AM  This patient was staffed with Dr.Konidena, Madelyn FlavorsSnehalatha who personally evaluated patient, reviewed documentation and agreed with assessment and plan of care as above.  Webb SilversmithElizabeth Nayah Lukens, DNP, FNP-BC [facility] Nurse Practitioner   Between 7am  to 6pm - Pager [phone]- [phone]  After 6pm go to [email or web address] - Social research officer, [email or web address] [email or web address]  [facility] [place] [facility]  Office  [phone]  CC: Primary care physician; Dorothey BasemanBronstein, David, MD  Note: This dictation was prepared with Dragon dictation along with smaller phrase technology. Any transcriptional errors that result from this process are unintentional.

## 2018-06-06 NOTE — Plan of Care (Signed)
  Problem: Clinical Measurements: Goal: Respiratory complications will improve Outcome: Progressing Note:  Pt now on a face tent, sats in the mid 90's   Problem: Nutrition: Goal: Adequate nutrition will be maintained Outcome: Progressing   Problem: Coping: Goal: Level of anxiety will decrease Outcome: Progressing   Problem: Elimination: Goal: Will not experience complications related to urinary retention Outcome: Progressing   Problem: Pain Managment: Goal: General experience of comfort will improve Outcome: Progressing Note:  No complaints of pain this shift   Problem: Safety: Goal: Ability to remain free from injury will improve Outcome: Progressing   Problem: Skin Integrity: Goal: Risk for impaired skin integrity will decrease Outcome: Progressing

## 2018-06-06 NOTE — Care Management Important Message (Signed)
Important Message  Patient Details  Name: Anthony Chambers MRN: [id number] Date of Birth: 10/13/1945   Medicare Important Message Given:  Other (see comment)  Nurse was trying to wake patient but wasn't alert enough to sign Important Message from Medicare.  Form left at patient bedside.   Olegario Messier A Aritzel Krusemark 06/06/2018, 11:13 AM

## 2018-06-06 NOTE — Progress Notes (Signed)
Family Meeting Note  Advance Directive:yes  Today a meeting took place with the sister.   Patient is lethargic and not able to participate, patient sister spoke to me over the phone, explained that she is okay with him going on ventilator for short duration and if he does not improve she wants to consider extubation and also DNR status that time.  But she wants to give him a chance.   Discussed pts prognosis  Additional follow-up to be provided:will be followed by hospitalist and intensivist  Time spent during discussion;20 min  Katha Hamming, MD

## 2018-06-06 NOTE — Progress Notes (Signed)
PT Cancellation Note  Patient Details Name: Anthony Chambers MRN: [id number] DOB: August 11, 1945   Cancelled Treatment:    Reason Eval/Treat Not Completed: Medical issues which prohibited therapy Pt with change in status and transferred to CCU.  PT will sign off, will need new PT orders when medically appropriate  Malachi Pro, DPT 06/06/2018, 4:14 PM

## 2018-06-06 NOTE — Progress Notes (Signed)
I called his sister, Velna Hatchet, to let her know we are transferring him to stepdown for continuous Bipap therapy.  She is familiar with CPAP.   She says the doctor just called and she appreciates the phone calls.

## 2018-06-06 NOTE — Progress Notes (Signed)
Drs. Luberta Mutter and Karna Christmas agree he needs continuous BiPAP.  Will transfer to step-down

## 2018-06-06 NOTE — Progress Notes (Signed)
Patient is lethargic and difficult to wake up and difficult to keep him awake. Obtaining ABG.

## 2018-06-06 NOTE — Progress Notes (Signed)
CRITICAL VALUE ALERT  Critical Value: pCO2 73. Date & Time Notied: 5/29 1230.  Provider Notified: Dr. Luberta Mutter Orders Received/Actions taken: none at this time

## 2018-06-06 NOTE — Progress Notes (Signed)
Patient more lethargic, on face tent, will order ABG, according to nurse patient is more lethargic today patient has been having some PVCs, bigeminy yesterday.  Potassium, magnesium repeatedly are all normal.  This x-ray showed bibasilar atelectasis filtrates.  Patient baseline mental status is slight mental retardation has history of schizophrenia.

## 2018-06-06 NOTE — Progress Notes (Signed)
No CPT metaneb at this time, patient sleeping and on Bipap.

## 2018-06-07 ENCOUNTER — Inpatient Hospital Stay: Payer: Medicare Other

## 2018-06-07 LAB — BLOOD GAS, ARTERIAL
Acid-Base Excess: 15.8 mmol/L — ABNORMAL HIGH (ref 0.0–2.0)
Acid-Base Excess: 14.8 mmol/L — ABNORMAL HIGH (ref 0.0–2.0)
Allens test (pass/fail): POSITIVE — AB
Bicarbonate: 45.1 mmol/L — ABNORMAL HIGH (ref 20.0–28.0)
Bicarbonate: 39.3 mmol/L — ABNORMAL HIGH (ref 20.0–28.0)
Delivery systems: POSITIVE
Delivery systems: POSITIVE
Expiratory PAP: 7
Expiratory PAP: 7
FIO2: 0.35
FIO2: 0.3
Inspiratory PAP: 14
Inspiratory PAP: 14
O2 Saturation: 89.4 %
O2 Saturation: 87.9 %
Patient temperature: 37
Patient temperature: 37
RATE: 12 resp/min
RATE: 16 resp/min
pCO2 arterial: 78 mmHg (ref 32.0–48.0)
pCO2 arterial: 46 mmHg (ref 32.0–48.0)
pH, Arterial: 7.37 (ref 7.350–7.450)
pH, Arterial: 7.54 — ABNORMAL HIGH (ref 7.350–7.450)
pO2, Arterial: 59 mmHg — ABNORMAL LOW (ref 83.0–108.0)
pO2, Arterial: 47 mmHg — ABNORMAL LOW (ref 83.0–108.0)

## 2018-06-07 LAB — CBC
HCT: 47 % (ref 39.0–52.0)
Hemoglobin: 13.8 g/dL (ref 13.0–17.0)
MCH: 25.2 pg — ABNORMAL LOW (ref 26.0–34.0)
MCHC: 29.4 g/dL — ABNORMAL LOW (ref 30.0–36.0)
MCV: 85.8 fL (ref 80.0–100.0)
Platelets: 170 10*3/uL (ref 150–400)
RBC: 5.48 MIL/uL (ref 4.22–5.81)
RDW: 14.6 % (ref 11.5–15.5)
WBC: 4.8 10*3/uL (ref 4.0–10.5)
nRBC: 0 % (ref 0.0–0.2)

## 2018-06-07 LAB — BASIC METABOLIC PANEL
Anion gap: 8 (ref 5–15)
BUN: 37 mg/dL — ABNORMAL HIGH (ref 8–23)
CO2: 37 mmol/L — ABNORMAL HIGH (ref 22–32)
Calcium: 8.3 mg/dL — ABNORMAL LOW (ref 8.9–10.3)
Chloride: 96 mmol/L — ABNORMAL LOW (ref 98–111)
Creatinine, Ser: 1.13 mg/dL (ref 0.61–1.24)
GFR calc Af Amer: >60 mL/min (ref >60)
GFR calc non Af Amer: >60 mL/min (ref >60)
Glucose, Bld: 97 mg/dL (ref 70–99)
Potassium: 4.2 mmol/L (ref 3.5–5.1)
Sodium: 141 mmol/L (ref 135–145)

## 2018-06-07 MED ORDER — LORAZEPAM 2 MG/ML IJ SOLN
0.5000 mg | Freq: Once | INTRAMUSCULAR | Status: AC
  Administered 2018-06-07: 13:00:00 0.5 mg via INTRAVENOUS
  Filled 2018-06-07: qty 1

## 2018-06-07 MED ORDER — BUDESONIDE 0.5 MG/2ML IN SUSP
0.5000 mg | Freq: Two times a day (BID) | RESPIRATORY_TRACT | Status: DC
  Administered 2018-06-08 – 2018-06-09 (×3): 0.5 mg via RESPIRATORY_TRACT
  Filled 2018-06-07 (×3): qty 2

## 2018-06-07 MED ORDER — LORAZEPAM 2 MG/ML IJ SOLN
0.5000 mg | Freq: Four times a day (QID) | INTRAMUSCULAR | Status: DC | PRN
  Administered 2018-06-07 – 2018-06-09 (×2): 0.5 mg via INTRAVENOUS
  Filled 2018-06-07: qty 1

## 2018-06-07 MED ORDER — LORAZEPAM 2 MG/ML IJ SOLN: 0.5000 mg | Freq: Four times a day (QID) | INTRAMUSCULAR | Status: DC

## 2018-06-07 MED ORDER — LORAZEPAM 2 MG/ML IJ SOLN
0.5000 mg | Freq: Once | INTRAMUSCULAR | Status: AC
  Administered 2018-06-07: 21:00:00 0.5 mg via INTRAVENOUS
  Filled 2018-06-07: qty 1

## 2018-06-07 NOTE — Progress Notes (Signed)
Spoke with Luci Bank, NP and made her aware that patient is agitated pulling BiPAP mask off and that ativan was given at 1815 and helped initially.  NP gave order for one time dose of 0.5 mg ativan IV once.

## 2018-06-07 NOTE — Progress Notes (Signed)
Pt agitated pulled off leads, condom cath, and is trying to take off bipap. MD notified and order given one time IV ativan. Ativan effective pt now resting comfortably after getting cleaned up.

## 2018-06-07 NOTE — Consult Note (Signed)
CRITICAL CARE NOTE      CHIEF COMPLAINT:   Encephalopathy with hypercapnic respiratory failure   HPI   This is a [age] year old male with depression, GERD, dyslipidemia, schizophrenia, coronary artery disease, essential hypertension, history of femoral artery thrombosis with stenting who initially came to the hospital due to hypoxic and hypercapnic respiratory failure.  He was initially evaluated for pneumonia as well as coronavirus and was found to be negative.  He did have 3+ pitting lower extremity edema as well as crackles on auscultation on day of admission and chest x-ray suggestive of pulmonary edema.  Patient had initially improved with Lasix however due to contraction alkalosis had developed worsening hypercapnia.  Of note he does have severe COPD and is a chronic CO2 retainer with bicarb in the high 30s to 40s at baseline.  PAST MEDICAL HISTORY   Past Medical History:  Diagnosis Date  . Depression   . GERD (gastroesophageal reflux disease)   . Hyperlipidemia   . Hypertension   . Schizophrenia (HCC)   . Total occlusion of coronary artery, chronic      SURGICAL HISTORY   History reviewed. No pertinent surgical history.   FAMILY HISTORY   Family History  Problem Relation Age of Onset  . Asthma Mother      SOCIAL HISTORY   Social History   Tobacco Use  . Smoking status: Former Smoker    Years: 40.00    Types: Cigarettes  . Smokeless tobacco: Never Used  Substance Use Topics  . Alcohol use: Not Currently  . Drug use: Never     MEDICATIONS   Current Medication:  Current Facility-Administered Medications:  .  acetaminophen (TYLENOL) tablet 650 mg, 650 mg, Oral, Q6H PRN **OR** acetaminophen (TYLENOL) suppository 650 mg, 650 mg, Rectal, Q6H PRN, Sainani, Vivek J, MD .  albuterol  (PROVENTIL) (2.5 MG/3ML) 0.083% nebulizer solution 2.5 mg, 2.5 mg, Nebulization, Q2H PRN, Katha HammingKonidena, Snehalatha, MD .  budesonide (PULMICORT) nebulizer solution 0.25 mg, 0.25 mg, Nebulization, BID, Karna ChristmasAleskerov, Ragan Duhon, MD, 0.25 mg at 06/06/18 2012 .  chlorhexidine (PERIDEX) 0.12 % solution 15 mL, 15 mL, Mouth Rinse, BID, Shalene Gallen, MD .  furosemide (LASIX) injection 40 mg, 40 mg, Intravenous, Daily, Katha HammingKonidena, Snehalatha, MD, 40 mg at 06/06/18 1055 .  guaiFENesin (ROBITUSSIN) 100 MG/5ML solution 200 mg, 10 mL, Oral, Q6H PRN, Sainani, Vivek J, MD .  ipratropium-albuterol (DUONEB) 0.5-2.5 (3) MG/3ML nebulizer solution 3 mL, 3 mL, Nebulization, Q6H, Katha HammingKonidena, Snehalatha, MD, 3 mL at 06/07/18 0127 .  MEDLINE mouth rinse, 15 mL, Mouth Rinse, BID, [place] BilberryPatel, Shreyang, MD, 15 mL at 06/06/18 2140 .  MEDLINE mouth rinse, 15 mL, Mouth Rinse, q12n4p, Albaro Deviney, MD, 15 mL at 06/06/18 1808 .  metoprolol succinate (TOPROL-XL) 24 hr tablet 25 mg, 25 mg, Oral, Daily, Sainani, Rolly PancakeVivek J, MD, 25 mg at 06/06/18 1054 .  OLANZapine (ZYPREXA) tablet 15 mg, 15 mg, Oral, Daily, Katha HammingKonidena, Snehalatha, MD, 15 mg at 06/06/18 2137 .  ondansetron (ZOFRAN) tablet 4 mg, 4 mg, Oral, Q6H PRN **OR** ondansetron (ZOFRAN) injection 4 mg, 4 mg, Intravenous, Q6H PRN, Sainani, Vivek J, MD .  pantoprazole (PROTONIX) EC tablet 40 mg, 40 mg, Oral, Daily, Sainani, Rolly PancakeVivek J, MD, 40 mg at 06/06/18 1053 .  PARoxetine (PAXIL-CR) 24 hr tablet 25 mg, 25 mg, Oral, Daily, Sainani, Rolly PancakeVivek J, MD, 25 mg at 06/06/18 1053 .  risperiDONE (RISPERDAL) tablet 2 mg, 2 mg, Oral, BID, [place] SirenSainani, Vivek J, MD, 2 mg at 06/06/18 2136 .  rivaroxaban (XARELTO)  tablet 20 mg, 20 mg, Oral, Daily, Sainani, Rolly Pancake, MD, 20 mg at 06/06/18 1054 .  senna-docusate (Senokot-S) tablet 2 tablet, 2 tablet, Oral, BID, Delfino Lovett, MD, 2 tablet at 06/06/18 1054 .  simvastatin (ZOCOR) tablet 20 mg, 20 mg, Oral, QHS, Sainani, Rolly Pancake, MD, 20 mg at 06/06/18 2136 .  sodium chloride flush  (NS) 0.9 % injection 3 mL, 3 mL, Intravenous, Q12H, Katha Hamming, MD, 3 mL at 06/06/18 2121 .  sodium chloride flush (NS) 0.9 % injection 3 mL, 3 mL, Intravenous, PRN, Katha Hamming, MD .  vitamin B-12 (CYANOCOBALAMIN) tablet 1,000 mcg, 1,000 mcg, Oral, Daily, Sainani, Vivek J, MD, 1,000 mcg at 06/06/18 1054    ALLERGIES   Heparin    REVIEW OF SYSTEMS    Unable to obtain due to hypercapnic encephalopathy and baseline dementia with schizophrenia.  PHYSICAL EXAMINATION   Vitals:   06/07/18 0200 06/07/18 0300  BP: 118/66 116/70  Pulse: 63 62  Resp: 15 14  Temp: 98.9 F (37.2 C)   SpO2: 99% 99%    GENERAL: Chronically ill-appearing poorly responsive HEAD: Normocephalic, atraumatic.  EYES: Pupils equal, round, reactive to light.  No scleral icterus.  MOUTH: Moist mucosal membrane. NECK: Supple. No thyromegaly. No nodules. No JVD.  PULMONARY: Crackles at the bases bilaterally CARDIOVASCULAR: S1 and S2. Regular rate and rhythm. No murmurs, rubs, or gallops.  GASTROINTESTINAL: Soft, nontender, non-distended. No masses. Positive bowel sounds. No hepatosplenomegaly.  MUSCULOSKELETAL: No swelling, clubbing, or edema.  NEUROLOGIC: Mild distress due to acute illness SKIN:intact,warm,dry   LABS AND IMAGING     LAB RESULTS: Recent Labs  Lab 06/06/18 0253 06/06/18 1455 06/07/18 0434  NA 142 140 141  K 3.7 4.4 4.2  CL 96* 92* 96*  CO2 36* 38* 37*  BUN 39* 33* 37*  CREATININE 1.12 1.17 1.13  GLUCOSE 102* 98 97   Recent Labs  Lab 06/02/18 0508 06/06/18 0253 06/07/18 0434  HGB 12.9* 13.0 13.8  HCT 43.7 43.3 47.0  WBC 5.3 5.1 4.8  PLT 184 167 170     IMAGING RESULTS: Dg Chest Port 1 View  Result Date: 06/07/2018 CLINICAL DATA:  Acute respiratory failure EXAM: PORTABLE CHEST 1 VIEW COMPARISON:  06/06/2018 FINDINGS: Mild bibasilar opacities, improved, possibly atelectasis. No frank interstitial edema. No pleural effusion or pneumothorax.  Cardiomegaly. IMPRESSION: Mild bibasilar opacities, improved, possibly atelectasis. Electronically Signed   By: Charline Bills M.D.   On: 06/07/2018 06:09   Dg Chest Port 1 View  Result Date: 06/06/2018 CLINICAL DATA:  Dyspnea EXAM: PORTABLE CHEST 1 VIEW COMPARISON:  06/05/2018 chest radiograph. FINDINGS: Low lung volumes. Stable cardiomediastinal silhouette with top-normal heart size. No pneumothorax. No pleural effusion. Bibasilar patchy lung opacities, left greater than right, not appreciably changed. No pulmonary edema. IMPRESSION: Stable patchy bibasilar lung opacities, left greater than right, compatible with multilobar pneumonia. Electronically Signed   By: Delbert Phenix M.D.   On: 06/06/2018 14:33      ASSESSMENT AND PLAN    -Multidisciplinary rounds held today   Acute hypoxemic and hypercarbic respiratory failure -Likely due to acute decompensated diastolic CHF with EF >55% - Evidenced by BNP elevation, CXR with CPA blunting and cephalization suggestive of interstitial edema, pitting LE edema and increased O2 requirement, complicated by atelectasis - Currently net6Lnegative, able to tolerate diuresis well will increase to 40 BID from 20 BID - stict I&Os, And 1200cc fluid restriction/ 24 hours - Noctural BIPAP to help with hypcapnia  Acute encephalopathy Likely due to hypercapnia as well as concomitant schizophrenia and advanced dementia -Currently on BiPAP plan for repeat ABG post increased ventilation -Palliative care consultation -appreciate input    COPD - likely partially responsible for hypercapnia as pH is normal with pCO2 68 - would start on DuoNEb and pulmicort nebulizer therapy  - no need for prednisone as patient does not have rhonchi/wheezing and is not coughing up thick plegm nor complaining of increased volume of respiratory secrtions     Advanced dementia with schizophrenia   -Overall poor prognosis    -May  restart home regimen once hemodynamically stable    -Palliative care on board-appreciate input   Atelectasis  - incentive spirometery - chest physiotherapy - patient would benefit from VEST therapy - PT to ambulate /ROM exercise   GI/Nutrition GI PROPHYLAXIS as indicated DIET-->TF's as tolerated Constipation protocol as indicated  ENDO - ICU hypoglycemic\Hyperglycemia protocol -check FSBS per protocol   ELECTROLYTES -follow labs as needed -replace as needed -pharmacy consultation   DVT/GI PRX ordered -SCDs  TRANSFUSIONS AS NEEDED MONITOR FSBS ASSESS the need for LABS as needed   Critical care provider statement:    Critical care time (minutes):  34   Critical care time was exclusive of:  Separately billable procedures and treating other patients   Critical care was necessary to treat or prevent imminent or life-threatening deterioration of the following conditions:   Acute hypoxic hypercapnic respiratory failure, acute decompensated diastolic CHF, schizophrenia, dementia, severe COPD, multiple comorbid conditions.   Critical care was time spent personally by me on the following activities:  Development of treatment plan with patient or surrogate, discussions with consultants, evaluation of patient's response to treatment, examination of patient, obtaining history from patient or surrogate, ordering and performing treatments and interventions, ordering and review of laboratory studies and re-evaluation of patient's condition.  I assumed direction of critical care for this patient from another provider in my specialty: no    This document was prepared using Dragon voice recognition software and may include unintentional dictation errors.    Vida Rigger, M.D.  Division of Pulmonary & Critical Care Medicine  [facility]

## 2018-06-07 NOTE — Progress Notes (Signed)
[facility] - [place] [facility]   PATIENT NAME: Anthony Chambers    MR#:  [id number]  DATE OF BIRTH:  06-02-1945  SUBJECTIVE:   Chief Complaint  Patient presents with  . Shortness of Breath   Patient intubated  REVIEW OF SYSTEMS:  Review of Systems  Constitutional: Negative for chills, fever, malaise/fatigue and weight loss.  HENT: Negative for congestion, hearing loss and sore throat.   Eyes: Negative for blurred vision and double vision.  Respiratory: Negative for cough, shortness of breath and wheezing.   Cardiovascular: Negative for chest pain, palpitations, orthopnea and leg swelling.  Gastrointestinal: Negative for abdominal pain, diarrhea, nausea and vomiting.  Genitourinary: Negative for dysuria and urgency.  Musculoskeletal: Negative for myalgias.  Skin: Negative for rash.  Neurological: Negative for dizziness, sensory change, speech change, focal weakness and headaches.  Psychiatric/Behavioral: Negative for depression.   DRUG ALLERGIES:   Allergies  Allergen Reactions  . Heparin    VITALS:  Blood pressure 126/74, pulse 62, temperature 98.6 F (37 C), temperature source Axillary, resp. rate 16, height 6\' 1"  (1.854 m), weight 112.1 kg, SpO2 100 %. PHYSICAL EXAMINATION:   GENERAL:  72 y.o.-year-old patient lying in the bed critically ill.  EYES: Pupils equal, round, reactive to light and accommodation. No scleral icterus. Extraocular muscles intact.  HEENT: Head atraumatic, normocephalic. Oropharynx and nasopharynx clear.  NECK:  Supple, no jugular venous distention. No thyroid enlargement, no tenderness.  LUNGS: Normal breath sounds bilaterally, no wheezing, rales,rhonchi or crepitation. No use of accessory muscles of respiration.  CARDIOVASCULAR: S1, S2 normal. No murmurs, rubs, or gallops.  ABDOMEN: Soft, nontender, nondistended. Bowel sounds present. No organomegaly or mass.  EXTREMITIES: Bilateral pitting edema of legs.No cyanosis, or  clubbing.  NEUROLOGIC: Cranial nerves II through XII are intact. Muscle strength 5/5 in all extremities. Sensation intact. Gait not checked.  PSYCHIATRIC: The patient is asleep difficult to arouse SKIN: No obvious rash, lesion, or ulcer.   DATA REVIEWED:  LABORATORY PANEL:  Male CBC Recent Labs  Lab 06/07/18 0434  WBC 4.8  HGB 13.8  HCT 47.0  PLT 170   ------------------------------------------------------------------------------------------------------------------ Chemistries  Recent Labs  Lab 06/05/18 1735 06/06/18 0253  06/07/18 0434  NA  --  142   < > 141  K 4.2 3.7   < > 4.2  CL  --  96*   < > 96*  CO2  --  36*   < > 37*  GLUCOSE  --  102*   < > 97  BUN  --  39*   < > 37*  CREATININE  --  1.12   < > 1.13  CALCIUM  --  8.4*   < > 8.3*  MG 2.1  --   --   --   AST  --  12*  --   --   ALT  --  6  --   --   ALKPHOS  --  80  --   --   BILITOT  --  0.6  --   --    < > = values in this interval not displayed.   RADIOLOGY:  Dg Chest Port 1 View  Result Date: 06/07/2018 CLINICAL DATA:  Acute respiratory failure EXAM: PORTABLE CHEST 1 VIEW COMPARISON:  06/06/2018 FINDINGS: Mild bibasilar opacities, improved, possibly atelectasis. No frank interstitial edema. No pleural effusion or pneumothorax. Cardiomegaly. IMPRESSION: Mild bibasilar opacities, improved, possibly atelectasis. Electronically Signed   By: Charline Bills M.D.   On:  06/07/2018 06:09   Dg Chest Port 1 View  Result Date: 06/06/2018 CLINICAL DATA:  Dyspnea EXAM: PORTABLE CHEST 1 VIEW COMPARISON:  06/05/2018 chest radiograph. FINDINGS: Low lung volumes. Stable cardiomediastinal silhouette with top-normal heart size. No pneumothorax. No pleural effusion. Bibasilar patchy lung opacities, left greater than right, not appreciably changed. No pulmonary edema. IMPRESSION: Stable patchy bibasilar lung opacities, left greater than right, compatible with multilobar pneumonia. Electronically Signed   By: Delbert PhenixJason A Poff M.D.    On: 06/06/2018 14:33   ASSESSMENT AND PLAN:   73 y.o. male with a known history of hypertension, hyperlipidemia, GERD, depression, schizophrenia who presents to the hospital due to shortness of breath and noted to be in acute respiratory failure with hypoxia and hypercapnia.  1. Acute hypoxic and hypercarbic respiratory failure Due to acute diastolic CHF Continue IV Lasix Continue ventilator support as per pulmonary  2. Acute decompensated Diastolic Congestive Heart Failure- Likely volume overload -IV Lasix as above  3. Acute on chronic COPD Exacerbation -  Continue nebulizer therapy Continue Pulmicort  4. History of atrial fibrillation-rate controlled.  - Continue Toprol, continue Xarelto.  5. Hyperlipidemia-continue Zocor.  6. History of schizophrenia/depression  - Continue Risperdal, Zyprexa.  7. GERD - stable  - Continue Protonix.  All the records are reviewed and case discussed with Care Management/Social Worker. Management plans discussed with the patient, family and they are in agreement.  CODE STATUS: Full Code  TOTAL TIME TAKING CARE OF THIS PATIENT: 35minutes.   More than 50% of the time was spent in counseling/coordination of care: YES  POSSIBLE D/C IN 1-2 DAYS, DEPENDING ON CLINICAL CONDITION.   on 06/07/2018 at 3:50 PM  This patient was staffed with Dr.Konidena, Madelyn FlavorsSnehalatha who personally evaluated patient, reviewed documentation and agreed with assessment and plan of care as above.  Webb SilversmithElizabeth Ouma, DNP, FNP-BC [facility] Nurse Practitioner   Between 7am to 6pm - Pager [phone]- [phone]  After 6pm go to [email or web address] - Social research officer, governmentpassword [email or web address]  [facility]  Office  [phone]  CC: Primary care physician; Dorothey BasemanBronstein, David, MD  Note: This dictation was prepared with Dragon dictation along with smaller phrase technology. Any transcriptional errors that result from this process are unintentional.

## 2018-06-07 NOTE — Progress Notes (Signed)
Pt on bipap, cpt by bed done in lieu of metaneb

## 2018-06-07 NOTE — Consult Note (Signed)
Cardiology Consultation Note    Patient ID: Anthony Chambers, MRN: [id number], DOB/AGE: 71-Dec-1947 71 y.o. Admit date: 06/01/2018   Date of Consult: 06/07/2018 Primary Physician: Dorothey Baseman, MD Primary Cardiologist:    Chief Complaint: Shortness of breath Reason for Consultation: Respiratory failure Requesting MD: Dr. Allena Katz  HPI: Anthony Chambers is a 73 y.o. 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  He presented to the emergency room with complaints of shortness of breath was noted to be hypercapnic and hypoxic.  Due to his underlying dementia and schizophrenia limited history was obtained.  Chest x-ray suggested mild pulmonary edema he developed worsening hypercapnia despite BiPAP and was transferred to the ICU.  Telemetry showed sinus rhythm.  Patient had occasional PVCs.  Had a 17 beat run of a wide-complex tachycardia.  Morphology suggest possible aberrant SVT versus ventricular arrhythmia.  Potassium is normal.  Currently on metoprolol succinate at 25 mg daily, anticoagulated with rivaroxaban at 20 mg daily,  Past Medical History:  Diagnosis Date  . Depression   . GERD (gastroesophageal reflux disease)   . Hyperlipidemia   . Hypertension   . Schizophrenia (HCC)   . Total occlusion of coronary artery, chronic       Surgical History: History reviewed. No pertinent surgical history.   Home Meds: Prior to Admission medications   Medication Sig Start Date End Date Taking? Authorizing Provider  acetaminophen (TYLENOL) 500 MG tablet Take 1,000 mg by mouth at bedtime.   Yes [provider]  guaifenesin (ROBITUSSIN) 100 MG/5ML syrup Take 10 mLs by mouth every 6 (six) hours as needed for cough.   Yes [provider]  metoprolol succinate (TOPROL-XL) 25 MG 24 hr tablet Take 25 mg by mouth daily.   Yes [provider]  OLANZapine (ZYPREXA) 15 MG tablet Take 30 mg by mouth daily.   Yes [provider]  omeprazole (PRILOSEC) 20 MG capsule Take 20 mg by mouth daily.   Yes [provider]  PARoxetine (PAXIL-CR) 25 MG 24 hr tablet Take 25 mg by mouth daily.   Yes [provider]  risperiDONE (RISPERDAL) 2 MG tablet Take 2 mg by mouth 2 (two) times daily.   Yes [provider]  rivaroxaban (XARELTO) 20 MG TABS tablet Take 20 mg by mouth daily.   Yes [provider]  simvastatin (ZOCOR) 20 MG tablet Take 20 mg by mouth at bedtime.   Yes [provider]  triamcinolone cream (KENALOG) 0.5 % Apply 1 application topically 2 (two) times daily.   Yes [provider]  vitamin B-12 (CYANOCOBALAMIN) 1000 MCG tablet Take 1,000 mcg by mouth daily.   Yes [provider]    Inpatient Medications:  . budesonide (PULMICORT) nebulizer solution  0.25 mg Nebulization BID  . chlorhexidine  15 mL Mouth Rinse BID  . furosemide  40 mg Intravenous Daily  . ipratropium-albuterol  3 mL Nebulization Q6H  . mouth rinse  15 mL Mouth Rinse BID  . mouth rinse  15 mL Mouth Rinse q12n4p  . metoprolol succinate  25 mg Oral Daily  . OLANZapine  15 mg Oral Daily  . pantoprazole  40 mg Oral Daily  . PARoxetine  25 mg Oral Daily  . risperiDONE  2 mg Oral BID  . rivaroxaban  20 mg Oral Daily  . senna-docusate  2 tablet Oral BID  . simvastatin  20 mg Oral QHS  . sodium chloride flush  3 mL Intravenous Q12H  . vitamin B-12  1,000 mcg Oral Daily     Allergies:  Allergies  Allergen Reactions  . Heparin     Social History   Socioeconomic History  . Marital status: Single    Spouse name: Not on file  . Number of children: Not on file  . Years of education: Not on file  . Highest education level: Not on file  Occupational History  . Not on file   Social Needs  . Financial resource strain: Not on file  . Food insecurity:    Worry: Not on file    Inability: Not on file  . Transportation needs:    Medical: Not on file    Non-medical: Not on file  Tobacco Use  . Smoking status: Former Smoker    Years: 40.00    Types: Cigarettes  . Smokeless tobacco: Never Used  Substance and Sexual Activity  . Alcohol use: Not Currently  . Drug use: Never  . Sexual activity: Not on file  Lifestyle  . Physical activity:    Days per week: Not on file    Minutes per session: Not on file  . Stress: Not on file  Relationships  . Social connections:    Talks on phone: Not on file    Gets together: Not on file    Attends religious service: Not on file    Active member of club or organization: Not on file    Attends meetings of clubs or organizations: Not on file    Relationship status: Not on file  . Intimate partner violence:    Fear of current or ex partner: Not on file    Emotionally abused: Not on file    Physically abused: Not on file    Forced sexual activity: Not on file  Other Topics Concern  . Not on file  Social History Narrative  . Not on file     Family History  Problem Relation Age of Onset  . Asthma Mother      Review of Systems: A 12-system review of systems was performed and is negative except as noted in the HPI.  Labs: No results for input(s): CKTOTAL, CKMB, TROPONINI in the last 72 hours. Lab Results  Component Value Date   WBC 4.8 06/07/2018   HGB 13.8 06/07/2018   HCT 47.0 06/07/2018   MCV 85.8 06/07/2018   PLT 170 06/07/2018    Recent Labs  Lab 06/06/18 0253  06/07/18 0434  NA 142   < > 141  K 3.7   < > 4.2  CL 96*   < > 96*  CO2 36*   < > 37*  BUN 39*   < > 37*  CREATININE 1.12   < > 1.13  CALCIUM 8.4*   < > 8.3*  PROT 6.6  --   --   BILITOT 0.6  --   --   ALKPHOS 80  --   --   ALT 6  --   --   AST 12*  --   --   GLUCOSE 102*   < > 97   < > = values in this interval not displayed.    No results found for: CHOL, HDL, LDLCALC, TRIG No results found for: DDIMER  Radiology/Studies:  Dg Chest Port 1 View  Result  Date: 06/07/2018 CLINICAL DATA:  Acute respiratory failure EXAM: PORTABLE CHEST 1 VIEW COMPARISON:  06/06/2018 FINDINGS: Mild bibasilar opacities, improved, possibly atelectasis. No frank interstitial edema. No pleural effusion or pneumothorax. Cardiomegaly. IMPRESSION: Mild bibasilar opacities, improved, possibly atelectasis. Electronically Signed   By: Charline BillsSriyesh  Krishnan M.D.   On: 06/07/2018 06:09   Dg Chest Port 1 View  Result Date: 06/06/2018 CLINICAL DATA:  Dyspnea EXAM: PORTABLE CHEST 1 VIEW COMPARISON:  06/05/2018 chest radiograph. FINDINGS: Low lung volumes. Stable cardiomediastinal silhouette with top-normal heart size. No pneumothorax. No pleural effusion. Bibasilar patchy lung opacities, left greater than right, not appreciably changed. No pulmonary edema. IMPRESSION: Stable patchy bibasilar lung opacities, left greater than right, compatible with multilobar pneumonia. Electronically Signed   By: Delbert PhenixJason A Poff M.D.   On: 06/06/2018 14:33   Dg Chest Port 1 View  Result Date: 06/05/2018 CLINICAL DATA:  Short of breath EXAM: PORTABLE CHEST 1 VIEW COMPARISON:  06/01/2018 FINDINGS: Bibasilar airspace disease unchanged. No effusion. Elevated right hemidiaphragm. IMPRESSION: Stable bibasilar infiltrates. Electronically Signed   By: Marlan Palauharles  Clark M.D.   On: 06/05/2018 09:14   Dg Chest Port 1 View  Result Date: 06/01/2018 CLINICAL DATA:  Worsening shortness of breath since yesterday EXAM: PORTABLE CHEST 1 VIEW COMPARISON:  None. FINDINGS: Cardiomegaly. Coarse interstitial markings bilaterally indicating interstitial edema or chronic interstitial lung disease. No confluent opacity to suggest a consolidating pneumonia. No pleural effusion or pneumothorax seen. Elevation of the RIGHT hemidiaphragm, of uncertain chronicity. Osseous structures about the chest are  unremarkable. IMPRESSION: 1. Cardiomegaly. 2. Coarse interstitial markings bilaterally, compatible with CHF and/or chronic interstitial lung disease. 3. No evidence of consolidating pneumonia seen. Electronically Signed   By: Bary RichardStan  Maynard M.D.   On: 06/01/2018 16:25    Wt Readings from Last 3 Encounters:  06/07/18 112.1 kg    EKG: Sinus rhythm with a right bundle branch block  Physical Exam:  Blood pressure 116/70, pulse 62, temperature 98.6 F (37 C), temperature source Axillary, resp. rate 14, height 6\' 1"  (1.854 m), weight 112.1 kg, SpO2 98 %. Body mass index is 32.61 kg/m. General: Well developed, well nourished, in no acute distress. Head: Normocephalic, atraumatic, sclera non-icteric, no xanthomas, nares are without discharge.  Neck: Negative for carotid bruits. JVD not elevated. Lungs: Clear bilaterally to auscultation without wheezes, rales, or rhonchi. Breathing is unlabored. Heart: RRR with S1 S2. No murmurs, rubs, or gallops appreciated. Abdomen: Soft, non-tender, non-distended with normoactive bowel sounds. No hepatomegaly. No rebound/guarding. No obvious abdominal masses. Msk:  Strength and tone appear normal for age. Extremities: No clubbing or cyanosis. No edema.  Distal pedal pulses are 2+ and equal bilaterally. Neuro: Alert and oriented X 3. No facial asymmetry. No focal deficit. Moves all extremities spontaneously. Psych:  Responds to questions appropriately with a normal affect.     Assessment and Plan  73 year old male with history of hypertension, hyperlipidemia, schizophrenia as well as dementia admitted with shortness of breath.  Noted to be in pulmonary edema.  Treated with diuresis.  Noted to have frequent PVCs with 17 run of a wide-complex tachycardia.  Currently stable.  Reviewing rhythm shows frequent PVCs multi-focal with a wider complex tachycardia consistent with aberrant SVT versus ventricular rhythm.  Currently on metoprolol at 25 mg of succinate daily.   He has been anticoagulated with rivaroxaban.  Currently on BiPAP.  Patient has decompensated diastolic heart failure.  BNP is elevated.  Does not appear to require any further invasive or noninvasive cardiac procedures.  We  will follow with you.  Agree with palliative care consult.  Further recommendations based on course.  Caren Macadam MD 06/07/2018, 8:54 AM Pager: [phone]

## 2018-06-08 NOTE — Progress Notes (Signed)
CRITICAL CARE NOTE        SUBJECTIVE FINDINGS & SIGNIFICANT EVENTS   Patient has been transitioned to nasal canula.  He is easily arousable and able to speak but has advanced dementia and baseline schizophrenia. Plan to downgrade to medical floor today.     Prognosis is guarded.  I had family discussion with sister Velna HatchetSheila [facility](POA), had explained patient required BIPAP and has had hypercapnic hypoxemic respiratory failure.  She states he has been sick and declining over many years at [facility] with advanced dementia and schizophrenia.  Patient does not wear BIPAP and will likely continue to decline, and complains of severe pain at left thigh and leg. I had explained if we treat pain his respiratory status may decline.  Velna HatchetSheila [facility](POA) wishes to make patient comfort care and start comfort measures.     PAST MEDICAL HISTORY   Past Medical History:  Diagnosis Date  . Depression   . GERD (gastroesophageal reflux disease)   . Hyperlipidemia   . Hypertension   . Schizophrenia (HCC)   . Total occlusion of coronary artery, chronic      SURGICAL HISTORY   History reviewed. No pertinent surgical history.   FAMILY HISTORY   Family History  Problem Relation Age of Onset  . Asthma Mother      SOCIAL HISTORY   Social History   Tobacco Use  . Smoking status: Former Smoker    Years: 40.00    Types: Cigarettes  . Smokeless tobacco: Never Used  Substance Use Topics  . Alcohol use: Not Currently  . Drug use: Never     MEDICATIONS   Current Medication:  Current Facility-Administered Medications:  .  acetaminophen (TYLENOL) tablet 650 mg, 650 mg, Oral, Q6H PRN **OR** acetaminophen (TYLENOL) suppository 650 mg, 650 mg, Rectal, Q6H PRN, Sainani, Vivek J, MD .  albuterol (PROVENTIL) (2.5 MG/3ML) 0.083% nebulizer  solution 2.5 mg, 2.5 mg, Nebulization, Q2H PRN, Luberta MutterKonidena, Snehalatha, MD .  budesonide (PULMICORT) nebulizer solution 0.5 mg, 0.5 mg, Nebulization, BID, Tukov-Yual, Magdalene S, NP .  chlorhexidine (PERIDEX) 0.12 % solution 15 mL, 15 mL, Mouth Rinse, BID, Rhilyn Battle, MD, 15 mL at 06/07/18 2331 .  furosemide (LASIX) injection 40 mg, 40 mg, Intravenous, Daily, Katha HammingKonidena, Snehalatha, MD, 40 mg at 06/07/18 1109 .  guaiFENesin (ROBITUSSIN) 100 MG/5ML solution 200 mg, 10 mL, Oral, Q6H PRN, Sainani, Vivek J, MD .  ipratropium-albuterol (DUONEB) 0.5-2.5 (3) MG/3ML nebulizer solution 3 mL, 3 mL, Nebulization, Q6H, Katha HammingKonidena, Snehalatha, MD, 3 mL at 06/08/18 0128 .  LORazepam (ATIVAN) injection 0.5 mg, 0.5 mg, Intravenous, Q6H PRN, Vida RiggerAleskerov, Celestine Prim, MD, 0.5 mg at 06/07/18 1815 .  MEDLINE mouth rinse, 15 mL, Mouth Rinse, BID, Auburn BilberryPatel, Shreyang, MD, 15 mL at 06/06/18 2140 .  MEDLINE mouth rinse, 15 mL, Mouth Rinse, q12n4p, Dickie Labarre, MD, 15 mL at 06/07/18 1158 .  metoprolol succinate (TOPROL-XL) 24 hr tablet 25 mg, 25 mg, Oral, Daily, Sainani, Rolly PancakeVivek J, MD, 25 mg at 06/06/18 1054 .  OLANZapine (ZYPREXA) tablet 15 mg, 15 mg, Oral, Daily, Katha HammingKonidena, Snehalatha, MD, 15 mg at 06/06/18 2137 .  ondansetron (ZOFRAN) tablet 4 mg, 4 mg, Oral, Q6H PRN **OR** ondansetron (ZOFRAN) injection 4 mg, 4 mg, Intravenous, Q6H PRN, Sainani, Vivek J, MD .  pantoprazole (PROTONIX) EC tablet 40 mg, 40 mg, Oral, Daily, Sainani, Rolly PancakeVivek J, MD, 40 mg at 06/06/18 1053 .  PARoxetine (PAXIL-CR) 24 hr tablet 25 mg, 25 mg, Oral, Daily, Sainani, Rolly PancakeVivek J, MD, 25 mg at  06/06/18 1053 .  risperiDONE (RISPERDAL) tablet 2 mg, 2 mg, Oral, BID, Houston Siren, MD, 2 mg at 06/06/18 2136 .  rivaroxaban (XARELTO) tablet 20 mg, 20 mg, Oral, Daily, Sainani, Rolly Pancake, MD, 20 mg at 06/06/18 1054 .  senna-docusate (Senokot-S) tablet 2 tablet, 2 tablet, Oral, BID, Delfino Lovett, MD, 2 tablet at 06/06/18 1054 .  simvastatin (ZOCOR) tablet 20 mg, 20 mg, Oral,  QHS, Sainani, Rolly Pancake, MD, 20 mg at 06/06/18 2136 .  sodium chloride flush (NS) 0.9 % injection 3 mL, 3 mL, Intravenous, Q12H, Katha Hamming, MD, 3 mL at 06/07/18 2230 .  sodium chloride flush (NS) 0.9 % injection 3 mL, 3 mL, Intravenous, PRN, Katha Hamming, MD .  vitamin B-12 (CYANOCOBALAMIN) tablet 1,000 mcg, 1,000 mcg, Oral, Daily, Cherlynn Kaiser, Vivek J, MD, 1,000 mcg at 06/06/18 1054    ALLERGIES   Heparin    REVIEW OF SYSTEMS    Unable to obtain due to advanced dementia and schizophrenia.   PHYSICAL EXAMINATION   Vitals:   06/08/18 0500 06/08/18 0700  BP: (!) 110/55 (!) 103/55  Pulse: 60 (!) 59  Resp: 12 13  Temp:    SpO2: 97% 98%    GENERAL:Resting in bed HEAD: Normocephalic, atraumatic.  EYES: Pupils equal, round, reactive to light.  No scleral icterus.  MOUTH: Moist mucosal membrane. NECK: Supple. No thyromegaly. No nodules. No JVD.  PULMONARY: CTAB CARDIOVASCULAR: S1 and S2. Regular rate and rhythm. No murmurs, rubs, or gallops.  GASTROINTESTINAL: Soft, nontender, non-distended. No masses. Positive bowel sounds. No hepatosplenomegaly.  MUSCULOSKELETAL: No swelling, clubbing, or edema.  NEUROLOGIC: Mild distress due to acute illness SKIN:intact,warm,dry   LABS AND IMAGING    LAB RESULTS: Recent Labs  Lab 06/06/18 0253 06/06/18 1455 06/07/18 0434  NA 142 140 141  K 3.7 4.4 4.2  CL 96* 92* 96*  CO2 36* 38* 37*  BUN 39* 33* 37*  CREATININE 1.12 1.17 1.13  GLUCOSE 102* 98 97   Recent Labs  Lab 06/02/18 0508 06/06/18 0253 06/07/18 0434  HGB 12.9* 13.0 13.8  HCT 43.7 43.3 47.0  WBC 5.3 5.1 4.8  PLT 184 167 170     IMAGING RESULTS: No results found.       ASSESSMENT AND PLAN    -Multidisciplinary rounds held today   Acute hypoxemic and hypercarbic respiratory failure -Likely due to acute decompensated diastolic CHF with EF >55% - Evidenced by BNP elevation, CXR with CPA blunting and cephalization suggestive of  interstitial edema, pitting LE edema and increased O2 requirement, complicated by atelectasis - stict I&Os, And 1200cc fluid restriction/ 24 hours - Has been on BIPAP with increased minute ventilation.  He has improved will optimize for downgrade to medical floor.      Acute encephalopathy Likely due to hypercapnia as well as concomitant schizophrenia and advanced dementia -Currently on BiPAP plan for repeat ABG post increased ventilation -Palliative care consultation -appreciate input    COPD - likely partially responsible for hypercapnia as pH is normal with pCO2 68 - would start on DuoNEb and pulmicort nebulizer therapy  - no need for prednisone as patient does not have rhonchi/wheezing and is not coughing up thick plegm nor complaining of increased volume of respiratory secrtions     Advanced dementia with schizophrenia   -Overall poor prognosis    -May restart home regimen once hemodynamically stable    -Palliative care on board-appreciate input   Atelectasis  - incentive spirometery - chest physiotherapy - patient would benefit  from VEST therapy - PT to ambulate /ROM exercise   GI/Nutrition GI PROPHYLAXIS as indicated DIET-->TF's as tolerated Constipation protocol as indicated  ENDO - ICU hypoglycemic\Hyperglycemia protocol -check FSBS per protocol   ELECTROLYTES -follow labs as needed -replace as needed -pharmacy consultation   DVT/GI PRX ordered -SCDs  TRANSFUSIONS AS NEEDED MONITOR FSBS ASSESS the need for LABS as needed   Critical care provider statement:   Critical care time (minutes): 33  Critical care time was exclusive of: Separately billable procedures and treating other patients  Critical care was necessary to treat or prevent imminent or life-threatening deterioration of the following conditions:  Acute hypoxic hypercapnic respiratory failure, acute decompensated diastolic CHF,  schizophrenia, dementia, severe COPD, multiple comorbid conditions.  Critical care was time spent personally by me on the following activities: Development of treatment plan with patient or surrogate, discussions with consultants, evaluation of patient's response to treatment, examination of patient, obtaining history from patient or surrogate, ordering and performing treatments and interventions, ordering and review of laboratory studies and re-evaluation of patient's condition.  I assumed direction of critical care for this patient from another provider in my specialty: no   This document was prepared using Dragon voice recognition software and may include unintentional dictation errors.    Vida Rigger, M.D.  Division of Pulmonary & Critical Care Medicine  [facility]

## 2018-06-08 NOTE — Progress Notes (Addendum)
[employer] [facility] [place] [facility]   PATIENT NAME: Anthony Chambers    MR#:  [id number]  DATE OF BIRTH:  April 16, 1945  SUBJECTIVE:   Chief Complaint  Patient presents with  . Shortness of Breath   Patient extubated  REVIEW OF SYSTEMS:  Review of Systems  Constitutional: Negative for chills, fever, malaise/fatigue and weight loss.  HENT: Negative for congestion, hearing loss and sore throat.   Eyes: Negative for blurred vision and double vision.  Respiratory: Negative for cough, shortness of breath and wheezing.   Cardiovascular: Negative for chest pain, palpitations, orthopnea and leg swelling.  Gastrointestinal: Negative for abdominal pain, diarrhea, nausea and vomiting.  Genitourinary: Negative for dysuria and urgency.  Musculoskeletal: Negative for myalgias.  Skin: Negative for rash.  Neurological: Negative for dizziness, sensory change, speech change, focal weakness and headaches.  Psychiatric/Behavioral: Negative for depression.   DRUG ALLERGIES:   Allergies  Allergen Reactions  . Heparin    VITALS:  Blood pressure (!) 146/75, pulse 74, temperature 97.9 F (36.6 C), temperature source Axillary, resp. rate 17, height 6\' 1"  (1.854 m), weight 107.4 kg, SpO2 96 %. PHYSICAL EXAMINATION:   GENERAL:  73 y.o.-year-old patient lying in the bed critically ill.  EYES: Pupils equal, round, reactive to light and accommodation. No scleral icterus. Extraocular muscles intact.  HEENT: Head atraumatic, normocephalic. Oropharynx and nasopharynx clear.  NECK:  Supple, no jugular venous distention. No thyroid enlargement, no tenderness.  LUNGS: Normal breath sounds bilaterally, no wheezing, rales,rhonchi or crepitation. No use of accessory muscles of respiration.  CARDIOVASCULAR: S1, S2 normal. No murmurs, rubs, or gallops.  ABDOMEN: Soft, nontender, nondistended. Bowel sounds present. No organomegaly or mass.  EXTREMITIES: Bilateral pitting edema of legs.No cyanosis,  or clubbing.  NEUROLOGIC: Cranial nerves II through XII are intact. Muscle strength 5/5 in all extremities. Sensation intact. Gait not checked.  PSYCHIATRIC: The patient is asleep difficult to arouse SKIN: No obvious rash, lesion, or ulcer.   DATA REVIEWED:  LABORATORY PANEL:  Male CBC Recent Labs  Lab 06/07/18 0434  WBC 4.8  HGB 13.8  HCT 47.0  PLT 170   ------------------------------------------------------------------------------------------------------------------ Chemistries  Recent Labs  Lab 06/05/18 1735 06/06/18 0253  06/07/18 0434  NA  --  142   < > 141  K 4.2 3.7   < > 4.2  CL  --  96*   < > 96*  CO2  --  36*   < > 37*  GLUCOSE  --  102*   < > 97  BUN  --  39*   < > 37*  CREATININE  --  1.12   < > 1.13  CALCIUM  --  8.4*   < > 8.3*  MG 2.1  --   --   --   AST  --  12*  --   --   ALT  --  6  --   --   ALKPHOS  --  80  --   --   BILITOT  --  0.6  --   --    < > = values in this interval not displayed.   RADIOLOGY:  Dg Chest Port 1 View  Result Date: 06/07/2018 CLINICAL DATA:  Acute respiratory failure EXAM: PORTABLE CHEST 1 VIEW COMPARISON:  06/06/2018 FINDINGS: Mild bibasilar opacities, improved, possibly atelectasis. No frank interstitial edema. No pleural effusion or pneumothorax. Cardiomegaly. IMPRESSION: Mild bibasilar opacities, improved, possibly atelectasis. Electronically Signed   By: Roselie Awkward.D.  On: 06/07/2018 06:09   ASSESSMENT AND PLAN:   73 y.o. male with a known history of hypertension, hyperlipidemia, GERD, depression, schizophrenia who presents to the hospital due to shortness of breath and noted to be in acute respiratory failure with hypoxia and hypercapnia.  1. Acute hypoxic and hypercarbic respiratory failure Due to acute diastolic CHF Continue IV Lasix Patient extubated  2. Acute decompensated Diastolic Congestive Heart Failure- Likely volume overload -IV Lasix as above  3. Acute on chronic COPD Exacerbation -   Continue nebulizer therapy Continue Pulmicort  4. History of atrial fibrillation-rate controlled.  - Continue Toprol, continue Xarelto.  5. Hyperlipidemia-continue Zocor.  6. History of schizophrenia/depression  - Continue Risperdal, Zyprexa.  7. GERD - stable  - Continue Protonix.  All the records are reviewed and case discussed with Care Management/Social Worker. Management plans discussed with the patient, family and they are in agreement.  CODE STATUS: Full Code  TOTAL TIME TAKING CARE OF THIS PATIENT: 35minutes.   More than 50% of the time was spent in counseling/coordination of care: YES  POSSIBLE D/C IN 1-2 DAYS, DEPENDING ON CLINICAL CONDITION.   on 06/08/2018 at 2:41 PM   Between 7am to 6pm - Pager - [phone]  After 6pm go to [email or web address] - Social research officer, governmentpassword [email or web address]  [facility] [place] [facility]  Office  [phone]  CC: Primary care physician; Dorothey BasemanBronstein, David, MD  Note: This dictation was prepared with Dragon dictation along with smaller phrase technology. Any transcriptional errors that result from this process are unintentional.

## 2018-06-08 NOTE — Progress Notes (Signed)
CPT done by bed due to patients history of agitation

## 2018-06-08 NOTE — Progress Notes (Signed)
Patient is now DNR and on comfort care.  Refused medications this morning.  Patient asleep, covering up his head.  No acute distress.  Family in for visit. Patient awake at 1600.   Will continue to monitor.

## 2018-06-09 LAB — SARS CORONAVIRUS 2 BY RT PCR (HOSPITAL ORDER, PERFORMED IN ~~LOC~~ HOSPITAL LAB): SARS Coronavirus 2: NEGATIVE

## 2018-06-09 MED ORDER — MORPHINE SULFATE (PF) 2 MG/ML IV SOLN: 2.0000 mg | INTRAVENOUS | Status: DC | PRN

## 2018-06-09 MED ORDER — ALBUTEROL SULFATE (2.5 MG/3ML) 0.083% IN NEBU: 2.5000 mg | INHALATION_SOLUTION | RESPIRATORY_TRACT | 12 refills | Status: DC | PRN

## 2018-06-09 MED ORDER — BUDESONIDE 0.5 MG/2ML IN SUSP: 0.5000 mg | Freq: Two times a day (BID) | RESPIRATORY_TRACT | 12 refills | Status: DC

## 2018-06-09 MED ORDER — MORPHINE SULFATE 20 MG/5ML PO SOLN: 5.0000 mg | ORAL | 0 refills | Status: DC | PRN

## 2018-06-09 MED ORDER — LORAZEPAM 0.5 MG PO TABS: 0.5000 mg | ORAL_TABLET | Freq: Three times a day (TID) | ORAL | 0 refills | Status: DC | PRN

## 2018-06-09 NOTE — Progress Notes (Signed)
CRITICAL CARE NOTE       SUBJECTIVE FINDINGS & SIGNIFICANT EVENTS    Optimizing for downgrade to medical floor and ultimately back to nursing home.  For hosp[facility] to [facility].   PAST MEDICAL HISTORY   Past Medical History:  Diagnosis Date  . Depression   . GERD (gastroesophageal reflux disease)   . Hyperlipidemia   . Hypertension   . Schizophrenia (HCC)   . Total occlusion of coronary artery, chronic      SURGICAL HISTORY   History reviewed. No pertinent surgical history.   FAMILY HISTORY   Family History  Problem Relation Age of Onset  . Asthma Mother      SOCIAL HISTORY   Social History   Tobacco Use  . Smoking status: Former Smoker    Years: 40.00    Types: Cigarettes  . Smokeless tobacco: Never Used  Substance Use Topics  . Alcohol use: Not Currently  . Drug use: Never     MEDICATIONS   Current Medication:  Current Facility-Administered Medications:  .  acetaminophen (TYLENOL) tablet 650 mg, 650 mg, Oral, Q6H PRN **OR** acetaminophen (TYLENOL) suppository 650 mg, 650 mg, Rectal, Q6H PRN, Sainani, Vivek J, MD .  albuterol (PROVENTIL) (2.5 MG/3ML) 0.083% nebulizer solution 2.5 mg, 2.5 mg, Nebulization, Q2H PRN, Katha HammingKonidena, Snehalatha, MD .  budesonide (PULMICORT) nebulizer solution 0.5 mg, 0.5 mg, Nebulization, BID, Tukov-Yual, Magdalene S, NP, 0.5 mg at 06/08/18 1944 .  chlorhexidine (PERIDEX) 0.12 % solution 15 mL, 15 mL, Mouth Rinse, BID, Travell Desaulniers, MD, 15 mL at 06/08/18 2125 .  furosemide (LASIX) injection 40 mg, 40 mg, Intravenous, Daily, Katha HammingKonidena, Snehalatha, MD, 40 mg at 06/08/18 1144 .  guaiFENesin (ROBITUSSIN) 100 MG/5ML solution 200 mg, 10 mL, Oral, Q6H PRN, Sainani, Vivek J, MD .  ipratropium-albuterol (DUONEB) 0.5-2.5 (3) MG/3ML nebulizer solution 3 mL, 3  mL, Nebulization, Q6H, Katha HammingKonidena, Snehalatha, MD, 3 mL at 06/09/18 0203 .  LORazepam (ATIVAN) injection 0.5 mg, 0.5 mg, Intravenous, Q6H PRN, Vida RiggerAleskerov, Manha Amato, MD, 0.5 mg at 06/09/18 0347 .  MEDLINE mouth rinse, 15 mL, Mouth Rinse, BID, Auburn BilberryPatel, Shreyang, MD, 15 mL at 06/08/18 2126 .  MEDLINE mouth rinse, 15 mL, Mouth Rinse, q12n4p, Kirston Luty, MD, 15 mL at 06/08/18 1600 .  metoprolol succinate (TOPROL-XL) 24 hr tablet 25 mg, 25 mg, Oral, Daily, Sainani, Rolly PancakeVivek J, MD, 25 mg at 06/06/18 1054 .  OLANZapine (ZYPREXA) tablet 15 mg, 15 mg, Oral, Daily, Katha HammingKonidena, Snehalatha, MD, 15 mg at 06/08/18 2124 .  ondansetron (ZOFRAN) tablet 4 mg, 4 mg, Oral, Q6H PRN **OR** ondansetron (ZOFRAN) injection 4 mg, 4 mg, Intravenous, Q6H PRN, Sainani, Vivek J, MD .  pantoprazole (PROTONIX) EC tablet 40 mg, 40 mg, Oral, Daily, Sainani, Rolly PancakeVivek J, MD, 40 mg at 06/06/18 1053 .  PARoxetine (PAXIL-CR) 24 hr tablet 25 mg, 25 mg, Oral, Daily, Sainani, Rolly PancakeVivek J, MD, 25 mg at 06/06/18 1053 .  risperiDONE (RISPERDAL) tablet 2 mg, 2 mg, Oral, BID, Houston SirenSainani, Vivek J, MD, 2 mg at 06/08/18 2125 .  rivaroxaban (XARELTO) tablet 20 mg, 20 mg, Oral, Daily, Sainani, Rolly PancakeVivek J, MD, 20 mg at 06/06/18 1054 .  senna-docusate (Senokot-S) tablet 2 tablet, 2 tablet, Oral, BID, Delfino LovettShah, Vipul, MD, 2 tablet at 06/06/18 1054 .  simvastatin (ZOCOR) tablet 20 mg, 20 mg, Oral, QHS, Sainani, Rolly PancakeVivek J, MD, 20 mg at 06/08/18 2125 .  sodium chloride flush (NS) 0.9 % injection 3 mL, 3 mL, Intravenous, Q12H, Katha HammingKonidena, Snehalatha, MD, 3 mL at 06/08/18  2125 .  sodium chloride flush (NS) 0.9 % injection 3 mL, 3 mL, Intravenous, PRN, Katha Hamming, MD .  vitamin B-12 (CYANOCOBALAMIN) tablet 1,000 mcg, 1,000 mcg, Oral, Daily, Cherlynn Kaiser, Rolly Pancake, MD, 1,000 mcg at 06/06/18 1054    ALLERGIES   Heparin    REVIEW OF SYSTEMS    Unable to obtain due to advanced dementia.   PHYSICAL EXAMINATION   Vitals:   06/08/18 2300 06/09/18 0203  BP:    Pulse:     Resp: 19   Temp:    SpO2:  95%    GENERAL: No apparent distress HEAD: Normocephalic, atraumatic.  EYES: Pupils equal, round, reactive to light.  No scleral icterus.  MOUTH: Moist mucosal membrane. NECK: Supple. No thyromegaly. No nodules. No JVD.  PULMONARY: Mild bibasilar crackles CARDIOVASCULAR: S1 and S2. Regular rate and rhythm. No murmurs, rubs, or gallops.  GASTROINTESTINAL: Soft, nontender, non-distended. No masses. Positive bowel sounds. No hepatosplenomegaly.  MUSCULOSKELETAL: No swelling, clubbing, or edema.  NEUROLOGIC: Mild distress due to acute illness SKIN:intact,warm,dry   LABS AND IMAGING    LAB RESULTS: Recent Labs  Lab 06/06/18 0253 06/06/18 1455 06/07/18 0434  NA 142 140 141  K 3.7 4.4 4.2  CL 96* 92* 96*  CO2 36* 38* 37*  BUN 39* 33* 37*  CREATININE 1.12 1.17 1.13  GLUCOSE 102* 98 97   Recent Labs  Lab 06/06/18 0253 06/07/18 0434  HGB 13.0 13.8  HCT 43.3 47.0  WBC 5.1 4.8  PLT 167 170     IMAGING RESULTS: No results found.    ASSESSMENT AND PLAN   -Multidisciplinary rounds held today   Acute hypoxemic and hypercarbic respiratory failure -Likely due to acute decompensated diastolic CHF with EF >55% - Evidenced by BNP elevation, CXR with CPA blunting and cephalization suggestive of interstitial edema, pitting LE edema and increased O2 requirement, complicated by atelectasis - stict I&Os, And 1200cc fluid restriction/ 24 hours - Has been on BIPAP with increased minute ventilation.  He has improved will optimize for downgrade to medical floor.  -Comfort measures    Acute encephalopathy Likely due to hypercapnia as well as concomitant schizophrenia and advanced dementia -Currently on BiPAP plan for repeat ABG post increased ventilation -Palliative care consultation-appreciate input    COPD - likely partially responsible for hypercapnia as pH is normal with pCO2 68 - would start on DuoNEb and  pulmicort nebulizer therapy  - no need for prednisone as patient does not have rhonchi/wheezing and is not coughing up thick plegm nor complaining of increased volume of respiratory secrtions     Advanced dementia with schizophrenia -Overall poor prognosis -May restart home regimen once hemodynamically stable -Palliative care on board-appreciate input   Atelectasis  - incentive spirometery - chest physiotherapy - patient would benefit from VEST therapy - PT to ambulate /ROM exercise   GI/Nutrition GI PROPHYLAXIS as indicated DIET-->TF's as tolerated Constipation protocol as indicated  ENDO - ICU hypoglycemic\Hyperglycemia protocol -check FSBS per protocol   ELECTROLYTES -follow labs as needed -replace as needed -pharmacy consultation   DVT/GI PRX ordered -SCDs  TRANSFUSIONS AS NEEDED MONITOR FSBS ASSESS the need for LABS as needed   Critical care provider statement:  Critical care time (minutes):33 Critical care time was exclusive of: Separately billable procedures and treating other patients Critical care was necessary to treat or prevent imminent or life-threatening deterioration of the following conditions:Acute hypoxic hypercapnic respiratory failure, acute decompensated diastolic CHF, schizophrenia, dementia, severe COPD, multiple comorbid conditions. Critical care was time  spent personally by me on the following activities: Development of treatment plan with patient or surrogate, discussions with consultants, evaluation of patient's response to treatment, examination of patient, obtaining history from patient or surrogate, ordering and performing treatments and interventions, ordering and review of laboratory studies and re-evaluation of patient's condition. I assumed direction of critical care for this patient from another provider in my specialty: no   This document was prepared using Dragon voice  recognition software and may include unintentional dictation errors.   Vida Rigger, M.D.  Division of Pulmonary & Critical Care Medicine  [facility]

## 2018-06-09 NOTE — TOC Progression Note (Signed)
Transition of Care [facility]) - Progression Note    Patient Details  Name: Anthony Chambers MRN: [id number] Date of Birth: 01/22/1945  Transition of Care [facility]) CM/SW Contact  Allayne Butcher, RN Phone Number: 06/09/2018, 11:43 AM  Clinical Narrative:    RNCM called sister and left message for return call.  Plan is to discharge patient back to [facility] with hospice.  Sister needs to choose which hospice agency they would like to use.  Tina with [facility] is aware of discharge plan.     Expected Discharge Plan: Home w Hospice Care(To [facility] with Hospice) Barriers to Discharge: Other (comment)(sister needs to choose hospice agency)  Expected Discharge Plan and Services Expected Discharge Plan: Home w Hospice Care(To [facility] with Hospice)   Discharge Planning Services: CM Consult Post Acute Care Choice: Hospice Living arrangements for the past 2 months: Skilled Nursing Facility                                       Social Determinants of Health (SDOH) Interventions    Readmission Risk Interventions Readmission Risk Prevention Plan 06/03/2018  Post Dischage Appt Complete  Medication Screening Complete  Transportation Screening Complete  Some recent data might be hidden

## 2018-06-09 NOTE — NC FL2 (Signed)
[place] MEDICAID FL2 LEVEL OF CARE SCREENING TOOL     IDENTIFICATION  Patient Name: Anthony Chambers Birthdate: Feb 12, 1945 Sex: male Admission Date (Current Location): 06/01/2018  [place] and [place] Number:  Chiropodist and Address:  [facility], [address], [place], [place] [address]      Provider Number: [id number]  Attending Physician Name and Address:  Auburn Bilberry, MD  Relative Name and Phone Number:  Eligah East  [phone]-Sister    Current Level of Care: SNF(With Hospice ) Recommended Level of Care: Skilled Nursing Facility(with hospice) Prior Approval Number:    Date Approved/Denied:   PASRR Number: [id number]  Discharge Plan: SNF    Current Diagnoses: Patient Active Problem List   Diagnosis Date Noted  . Acute respiratory failure with hypoxia and hypercapnia (HCC) 06/01/2018    Orientation RESPIRATION BLADDER Height & Weight     Self  Normal Incontinent Weight: 107.9 kg Height:  6\' 1"  (185.4 cm)  BEHAVIORAL SYMPTOMS/MOOD NEUROLOGICAL BOWEL NUTRITION STATUS      Incontinent Diet  AMBULATORY STATUS COMMUNICATION OF NEEDS Skin   Total Care Verbally Bruising                       Personal Care Assistance Level of Assistance  Bathing, Feeding, Dressing, Total care Bathing Assistance: Maximum assistance Feeding assistance: Maximum assistance Dressing Assistance: Maximum assistance Total Care Assistance: Maximum assistance   Functional Limitations Info  Hearing Sight Info: Adequate Hearing Info: Impaired(Hard of hearing) Speech Info: Adequate    SPECIAL CARE FACTORS FREQUENCY  PT (By licensed PT)     PT Frequency: (Pt will be hospice- no PT or OT)              Contractures Contractures Info: Not present    Additional Factors Info  Code Status Code Status Info: DNR Allergies Info: Heparin           Current Medications (06/09/2018):  This is the current hospital active medication  list Current Facility-Administered Medications  Medication Dose Route Frequency Provider Last Rate Last Dose  . acetaminophen (TYLENOL) tablet 650 mg  650 mg Oral Q6H PRN Houston Siren, MD       Or  . acetaminophen (TYLENOL) suppository 650 mg  650 mg Rectal Q6H PRN Houston Siren, MD      . albuterol (PROVENTIL) (2.5 MG/3ML) 0.083% nebulizer solution 2.5 mg  2.5 mg Nebulization Q2H PRN Katha Hamming, MD      . budesonide (PULMICORT) nebulizer solution 0.5 mg  0.5 mg Nebulization BID Tukov-Yual, Magdalene S, NP   0.5 mg at 06/09/18 0731  . chlorhexidine (PERIDEX) 0.12 % solution 15 mL  15 mL Mouth Rinse BID Vida Rigger, MD   15 mL at 06/09/18 0948  . furosemide (LASIX) injection 40 mg  40 mg Intravenous Daily Katha Hamming, MD   40 mg at 06/08/18 1144  . guaiFENesin (ROBITUSSIN) 100 MG/5ML solution 200 mg  10 mL Oral Q6H PRN Houston Siren, MD      . ipratropium-albuterol (DUONEB) 0.5-2.5 (3) MG/3ML nebulizer solution 3 mL  3 mL Nebulization Q6H Katha Hamming, MD   3 mL at 06/09/18 0731  . LORazepam (ATIVAN) injection 0.5 mg  0.5 mg Intravenous Q6H PRN Vida Rigger, MD   0.5 mg at 06/09/18 0347  . MEDLINE mouth rinse  15 mL Mouth Rinse BID Auburn Bilberry, MD   15 mL at 06/08/18 2126  . MEDLINE mouth rinse  15  mL Mouth Rinse q12n4p Vida RiggerAleskerov, Fuad, MD   15 mL at 06/08/18 1600  . metoprolol succinate (TOPROL-XL) 24 hr tablet 25 mg  25 mg Oral Daily Houston SirenSainani, Vivek J, MD   25 mg at 06/06/18 1054  . morphine 2 MG/ML injection 2 mg  2 mg Intravenous Q1H PRN Vida RiggerAleskerov, Fuad, MD      . OLANZapine (ZYPREXA) tablet 15 mg  15 mg Oral Daily Katha HammingKonidena, Snehalatha, MD   15 mg at 06/08/18 2124  . ondansetron (ZOFRAN) tablet 4 mg  4 mg Oral Q6H PRN Houston SirenSainani, Vivek J, MD       Or  . ondansetron (ZOFRAN) injection 4 mg  4 mg Intravenous Q6H PRN Houston SirenSainani, Vivek J, MD      . pantoprazole (PROTONIX) EC tablet 40 mg  40 mg Oral Daily Houston SirenSainani, Vivek J, MD   40 mg at 06/06/18 1053  .  PARoxetine (PAXIL-CR) 24 hr tablet 25 mg  25 mg Oral Daily Houston SirenSainani, Vivek J, MD   25 mg at 06/06/18 1053  . risperiDONE (RISPERDAL) tablet 2 mg  2 mg Oral BID Houston SirenSainani, Vivek J, MD   2 mg at 06/08/18 2125  . rivaroxaban (XARELTO) tablet 20 mg  20 mg Oral Daily Houston SirenSainani, Vivek J, MD   20 mg at 06/06/18 1054  . senna-docusate (Senokot-S) tablet 2 tablet  2 tablet Oral BID Delfino LovettShah, Vipul, MD   2 tablet at 06/06/18 1054  . sodium chloride flush (NS) 0.9 % injection 3 mL  3 mL Intravenous Q12H Katha HammingKonidena, Snehalatha, MD   3 mL at 06/09/18 0952  . sodium chloride flush (NS) 0.9 % injection 3 mL  3 mL Intravenous PRN Katha HammingKonidena, Snehalatha, MD      . vitamin B-12 (CYANOCOBALAMIN) tablet 1,000 mcg  1,000 mcg Oral Daily Houston SirenSainani, Vivek J, MD   1,000 mcg at 06/06/18 1054     Discharge Medications: Please see discharge summary for a list of discharge medications.  Relevant Imaging Results:  Relevant Lab Results:   Additional Information SSN: [id number]  Allayne ButcherJeanna M Kyria Bumgardner, RN

## 2018-06-09 NOTE — TOC Transition Note (Signed)
Transition of Care [facility]) - CM/SW Discharge Note   Patient Details  Name: RACHAEL SCHICKLING MRN: [id number] Date of Birth: 08-13-1945  Transition of Care [facility]) CM/SW Contact:  Allayne Butcher, RN Phone Number: 06/09/2018, 3:15 PM   Clinical Narrative:    Discharge to facility, transport by EMS.  Sister notified.  Bedside RN to call report to [facility].    Final next level of care: Skilled Nursing Facility(with hospice) Barriers to Discharge: Barriers Resolved   Patient Goals and CMS Choice Patient states their goals for this hospitalization and ongoing recovery are:: [facility] CMS Medicare.gov Compare Post Acute Care list provided to:: Patient Represenative (must comment)(pt's sister) Choice offered to / list presented to : Sibling  Discharge Placement                Patient to be transferred to facility by: [facility] Name of family member notified: Carlota Raspberry Patient and family notified of of transfer: 06/09/18  Discharge Plan and Services   Discharge Planning Services: CM Consult Post Acute Care Choice: Hospice                               Social Determinants of Health (SDOH) Interventions     Readmission Risk Interventions Readmission Risk Prevention Plan 06/03/2018  Post Dischage Appt Complete  Medication Screening Complete  Transportation Screening Complete  Some recent data might be hidden

## 2018-06-09 NOTE — Progress Notes (Addendum)
New referral for [facility] hospice services at [facility] received from [facility]. Patient with possible discharge today, pending repeat COVID test. Patient seen lying in bed, alert, oriented x 3. Lunch tray at bedside, declined anything beside his cookies, which he ate all of and drank his tea and coffee. No coughing noted. Patient appreciative of assistance. Will continue to follow through discharge. 2:55pm Writer spoke with patient's sister [name] to initiate education regarding hospice services, philosophy and  Team approach to care with understanding voiced. Per chart note review, plan is for discharge back to [facility] today. Discharge summary faxed to referral. Dayna Barker BSN, RN, [facility] Liaison [facility] hospice [phone]

## 2018-06-09 NOTE — Discharge Summary (Signed)
[facility] - [place] [facility], 73 y.o., DOB 12/27/1945, MRN [id number]. Admission date: 06/01/2018 Discharge Date 06/09/2018 Primary MD Dorothey Baseman, MD Admitting Physician Houston Siren, MD  Admission Diagnosis  Acute respiratory failure with hypoxia and hypercapnia (HCC) [J96.01, J96.02]  Discharge Diagnosis   Active Problems: Acute hypoxic and hypercarbic respiratory failure due to acute diastolic CHF Acute on chronic COPD exacerbation History atrial fibrillation Hyperlipidemia Schizophrenia/depression GERD Dementia   Hospital Course  Anthony Chambers  is a 73 y.o. male with a known history of hypertension, hyperlipidemia, GERD, depression, schizophrenia who presents to the hospital due to shortness of breath and noted to be in acute respiratory failure with hypoxia and hypercapnia.  Patient has underlying dementia/schizophrenia.  Patient was noted to have an acute on chronic respiratory failure due to CHF exacerbation.  Initially he was diuresed however his respiratory status worsened and had to go to the ICU and to be placed on BiPAP.  Despite aggressive management management to continue to do poorly.  Pulmonologist discussed the case with family and it was decided the sister made him DNR as well as decided to have him be taken to speak with hospice.  Which we are currently arranging.           Consults  pulmonary/intensive care  Significant Tests:  See full reports for all details     Dg Chest Port 1 View  Result Date: 06/07/2018 CLINICAL DATA:  Acute respiratory failure EXAM: PORTABLE CHEST 1 VIEW COMPARISON:  06/06/2018 FINDINGS: Mild bibasilar opacities, improved, possibly atelectasis. No frank interstitial edema. No pleural effusion or pneumothorax. Cardiomegaly. IMPRESSION: Mild bibasilar opacities, improved, possibly atelectasis. Electronically Signed   By: Charline Bills M.D.   On: 06/07/2018 06:09   Dg Chest Port 1  View  Result Date: 06/06/2018 CLINICAL DATA:  Dyspnea EXAM: PORTABLE CHEST 1 VIEW COMPARISON:  06/05/2018 chest radiograph. FINDINGS: Low lung volumes. Stable cardiomediastinal silhouette with top-normal heart size. No pneumothorax. No pleural effusion. Bibasilar patchy lung opacities, left greater than right, not appreciably changed. No pulmonary edema. IMPRESSION: Stable patchy bibasilar lung opacities, left greater than right, compatible with multilobar pneumonia. Electronically Signed   By: Delbert Phenix M.D.   On: 06/06/2018 14:33   Dg Chest Port 1 View  Result Date: 06/05/2018 CLINICAL DATA:  Short of breath EXAM: PORTABLE CHEST 1 VIEW COMPARISON:  06/01/2018 FINDINGS: Bibasilar airspace disease unchanged. No effusion. Elevated right hemidiaphragm. IMPRESSION: Stable bibasilar infiltrates. Electronically Signed   By: Marlan Palau M.D.   On: 06/05/2018 09:14   Dg Chest Port 1 View  Result Date: 06/01/2018 CLINICAL DATA:  Worsening shortness of breath since yesterday EXAM: PORTABLE CHEST 1 VIEW COMPARISON:  None. FINDINGS: Cardiomegaly. Coarse interstitial markings bilaterally indicating interstitial edema or chronic interstitial lung disease. No confluent opacity to suggest a consolidating pneumonia. No pleural effusion or pneumothorax seen. Elevation of the RIGHT hemidiaphragm, of uncertain chronicity. Osseous structures about the chest are unremarkable. IMPRESSION: 1. Cardiomegaly. 2. Coarse interstitial markings bilaterally, compatible with CHF and/or chronic interstitial lung disease. 3. No evidence of consolidating pneumonia seen. Electronically Signed   By: Bary Richard M.D.   On: 06/01/2018 16:25       Today   Subjective:   Anthony Chambers patient confused unable to provide much review of system Objective:   Blood pressure 115/82, pulse (!) 105, temperature 98.4 F (36.9 C), temperature source Oral, resp. rate (!) 25, height  (1.854 m), weight 107.9 kg, SpO2  94 %.   .  Intake/Output Summary (Last 24 hours) at 06/09/2018 1354 Last data filed at 06/09/2018 1300 Gross per 24 hour  Intake 240 ml  Output 600 ml  Net -360 ml    Exam VITAL SIGNS: Blood pressure 115/82, pulse (!) 105, temperature 98.4 F (36.9 C), temperature source Oral, resp. rate (!) 25, height  (1.854 m), weight 107.9 kg, SpO2 94 %.  GENERAL:  73 y.o.-year-old patient lying in the bed with no acute distress.  EYES: Pupils equal, round, reactive to light and accommodation. No scleral icterus. Extraocular muscles intact.  HEENT: Head atraumatic, normocephalic. Oropharynx and nasopharynx clear.  NECK:  Supple, no jugular venous distention. No thyroid enlargement, no tenderness.  LUNGS: Normal breath sounds bilaterally, no wheezing, rales,rhonchi or crepitation. No use of accessory muscles of respiration.  CARDIOVASCULAR: S1, S2 normal. No murmurs, rubs, or gallops.  ABDOMEN: Soft, nontender, nondistended. Bowel sounds present. No organomegaly or mass.  EXTREMITIES: No pedal edema, cyanosis, or clubbing.  NEUROLOGIC: Awake but not oriented no focal deficits PSYCHIATRIC: Patient awake SKIN: No obvious rash, lesion, or ulcer.   Data Review     CBC w Diff:  Lab Results  Component Value Date   WBC 4.8 06/07/2018   HGB 13.8 06/07/2018   HGB 11.8 (L) 07/17/2012   HCT 47.0 06/07/2018   HCT 34.2 (L) 07/17/2012   PLT 170 06/07/2018   PLT 174 07/17/2012   LYMPHOPCT 16 06/01/2018   LYMPHOPCT 20.4 07/17/2012   MONOPCT 8 06/01/2018   MONOPCT 11.2 07/17/2012   EOSPCT 2 06/01/2018   EOSPCT 3.0 07/17/2012   BASOPCT 1 06/01/2018   BASOPCT 0.5 07/17/2012   CMP:  Lab Results  Component Value Date   NA 141 06/07/2018   NA 140 07/17/2012   K 4.2 06/07/2018   K 4.0 07/17/2012   CL 96 (L) 06/07/2018   CL 108 (H) 07/17/2012   CO2 37 (H) 06/07/2018   CO2 31 07/17/2012   BUN 37 (H) 06/07/2018   BUN 11 07/30/2012   CREATININE 1.13 06/07/2018   CREATININE 1.12 07/30/2012   PROT 6.6  06/06/2018   ALBUMIN 2.9 (L) 06/06/2018   BILITOT 0.6 06/06/2018   ALKPHOS 80 06/06/2018   AST 12 (L) 06/06/2018   ALT 6 06/06/2018  .  Micro Results Recent Results (from the past 240 hour(s))  Blood culture (routine x 2)     Status: Abnormal   Collection Time: 06/01/18  4:11 PM  Result Value Ref Range Status   Specimen Description   Final    BLOOD BLOOD LEFT FOREARM Performed at [facility], [address] [place], [place] [address]    Special Requests   Final    BOTTLES DRAWN AEROBIC AND ANAEROBIC Blood Culture adequate volume Performed at [facility], [address] [place], [place] [address]    Culture  Setup Time   Final    GRAM POSITIVE COCCI IN CLUSTERS AEROBIC BOTTLE ONLY CRITICAL RESULT CALLED TO, READ BACK BY AND VERIFIED WITH: S MARTIN 06/02/2018 AT 1515 BY H SOEWARDIMAN    Culture (A)  Final    STAPHYLOCOCCUS SPECIES (COAGULASE NEGATIVE) THE SIGNIFICANCE OF ISOLATING THIS ORGANISM FROM A SINGLE SET OF BLOOD CULTURES WHEN MULTIPLE SETS ARE DRAWN IS UNCERTAIN. PLEASE NOTIFY THE MICROBIOLOGY DEPARTMENT WITHIN ONE WEEK IF SPECIATION AND SENSITIVITIES ARE REQUIRED. Performed at [facility], [address] [place], [place] [address]    Report Status 06/04/2018 FINAL  Final  SARS Coronavirus  2 (CEPHEID- Performed in [facility] hospital lab), Hosp Order     Status: None   Collection Time: 06/01/18  4:11 PM  Result Value Ref Range Status   SARS Coronavirus 2 NEGATIVE NEGATIVE Final    Comment: (NOTE) If result is NEGATIVE SARS-CoV-2 target nucleic acids are NOT DETECTED. The SARS-CoV-2 RNA is generally detectable in upper and lower  respiratory specimens during the acute phase of infection. The lowest  concentration of SARS-CoV-2 viral copies this assay can detect is 250  copies / mL. A negative result does not preclude SARS-CoV-2 infection  and should not be used as the sole basis for treatment or other  patient management decisions.  A  negative result may occur with  improper specimen collection / handling, submission of specimen other  than nasopharyngeal swab, presence of viral mutation(s) within the  areas targeted by this assay, and inadequate number of viral copies  (<250 copies / mL). A negative result must be combined with clinical  observations, patient history, and epidemiological information. If result is POSITIVE SARS-CoV-2 target nucleic acids are DETECTED. The SARS-CoV-2 RNA is generally detectable in upper and lower  respiratory specimens dur ing the acute phase of infection.  Positive  results are indicative of active infection with SARS-CoV-2.  Clinical  correlation with patient history and other diagnostic information is  necessary to determine patient infection status.  Positive results do  not rule out bacterial infection or co-infection with other viruses. If result is PRESUMPTIVE POSTIVE SARS-CoV-2 nucleic acids MAY BE PRESENT.   A presumptive positive result was obtained on the submitted specimen  and confirmed on repeat testing.  While 2019 novel coronavirus  (SARS-CoV-2) nucleic acids may be present in the submitted sample  additional confirmatory testing may be necessary for epidemiological  and / or clinical management purposes  to differentiate between  SARS-CoV-2 and other Sarbecovirus currently known to infect humans.  If clinically indicated additional testing with an alternate test  methodology [phone]) is advised. The SARS-CoV-2 RNA is generally  detectable in upper and lower respiratory sp ecimens during the acute  phase of infection. The expected result is Negative. Fact Sheet for Patients:  BoilerBrush.com.cy Fact Sheet for Healthcare Providers: https://pope.com/ This test is not yet approved or cleared by the [place] FDA and has been authorized for detection and/or diagnosis of SARS-CoV-2 by FDA under an Emergency Use  Authorization (EUA).  This EUA will remain in effect (meaning this test can be used) for the duration of the COVID-19 declaration under Section 564(b)(1) of the Act, 21 U.S.C. section 360bbb-3(b)(1), unless the authorization is terminated or revoked sooner. Performed at [facility], [address] [place], [place] [address]   Blood Culture ID Panel (Reflexed)     Status: Abnormal   Collection Time: 06/01/18  4:11 PM  Result Value Ref Range Status   Enterococcus species NOT DETECTED NOT DETECTED Final   Listeria monocytogenes NOT DETECTED NOT DETECTED Final   Staphylococcus species DETECTED (A) NOT DETECTED Final    Comment: Methicillin (oxacillin) susceptible coagulase negative staphylococcus. Possible blood culture contaminant (unless isolated from more than one blood culture draw or clinical case suggests pathogenicity). No antibiotic treatment is indicated for blood  culture contaminants. CRITICAL RESULT CALLED TO, READ BACK BY AND VERIFIED WITH: S MARTIN 06/02/2018 AT 1515 BY H SOEWARDIMAN    Staphylococcus aureus (BCID) NOT DETECTED NOT DETECTED Final   Methicillin resistance NOT DETECTED NOT DETECTED Final   Streptococcus species NOT DETECTED NOT DETECTED  Final   Streptococcus agalactiae NOT DETECTED NOT DETECTED Final   Streptococcus pneumoniae NOT DETECTED NOT DETECTED Final   Streptococcus pyogenes NOT DETECTED NOT DETECTED Final   Acinetobacter baumannii NOT DETECTED NOT DETECTED Final   Enterobacteriaceae species NOT DETECTED NOT DETECTED Final   Enterobacter cloacae complex NOT DETECTED NOT DETECTED Final   Escherichia coli NOT DETECTED NOT DETECTED Final   Klebsiella oxytoca NOT DETECTED NOT DETECTED Final   Klebsiella pneumoniae NOT DETECTED NOT DETECTED Final   Proteus species NOT DETECTED NOT DETECTED Final   Serratia marcescens NOT DETECTED NOT DETECTED Final   Haemophilus influenzae NOT DETECTED NOT DETECTED Final   Neisseria meningitidis NOT DETECTED  NOT DETECTED Final   Pseudomonas aeruginosa NOT DETECTED NOT DETECTED Final   Candida albicans NOT DETECTED NOT DETECTED Final   Candida glabrata NOT DETECTED NOT DETECTED Final   Candida krusei NOT DETECTED NOT DETECTED Final   Candida parapsilosis NOT DETECTED NOT DETECTED Final   Candida tropicalis NOT DETECTED NOT DETECTED Final    Comment: Performed at [facility], [address] [place], [place] [address]  Blood culture (routine x 2)     Status: None   Collection Time: 06/01/18  4:19 PM  Result Value Ref Range Status   Specimen Description BLOOD BLOOD LEFT HAND  Final   Special Requests   Final    BOTTLES DRAWN AEROBIC AND ANAEROBIC Blood Culture adequate volume   Culture   Final    NO GROWTH 5 DAYS Performed at [facility], [address] [place], [place] [address]    Report Status 06/06/2018 FINAL  Final  MRSA PCR Screening     Status: None   Collection Time: 06/02/18  6:21 AM  Result Value Ref Range Status   MRSA by PCR NEGATIVE NEGATIVE Final    Comment:        The GeneXpert MRSA Assay (FDA approved for NASAL specimens only), is one component of a comprehensive MRSA colonization surveillance program. It is not intended to diagnose MRSA infection nor to guide or monitor treatment for MRSA infections. Performed at [facility], [address] [place], [place] [address]   SARS Coronavirus 2 (CEPHEID - Performed in [facility] hospital lab), Hosp Order     Status: None   Collection Time: 06/09/18 11:49 AM  Result Value Ref Range Status   SARS Coronavirus 2 NEGATIVE NEGATIVE Final    Comment: (NOTE) If result is NEGATIVE SARS-CoV-2 target nucleic acids are NOT DETECTED. The SARS-CoV-2 RNA is generally detectable in upper and lower  respiratory specimens during the acute phase of infection. The lowest  concentration of SARS-CoV-2 viral copies this assay can detect is 250  copies / mL. A negative result does not preclude SARS-CoV-2  infection  and should not be used as the sole basis for treatment or other  patient management decisions.  A negative result may occur with  improper specimen collection / handling, submission of specimen other  than nasopharyngeal swab, presence of viral mutation(s) within the  areas targeted by this assay, and inadequate number of viral copies  (<250 copies / mL). A negative result must be combined with clinical  observations, patient history, and epidemiological information. If result is POSITIVE SARS-CoV-2 target nucleic acids are DETECTED. The SARS-CoV-2 RNA is generally detectable in upper and lower  respiratory specimens dur ing the acute phase of infection.  Positive  results are indicative of active infection with SARS-CoV-2.  Clinical  correlation with patient history  and other diagnostic information is  necessary to determine patient infection status.  Positive results do  not rule out bacterial infection or co-infection with other viruses. If result is PRESUMPTIVE POSTIVE SARS-CoV-2 nucleic acids MAY BE PRESENT.   A presumptive positive result was obtained on the submitted specimen  and confirmed on repeat testing.  While 2019 novel coronavirus  (SARS-CoV-2) nucleic acids may be present in the submitted sample  additional confirmatory testing may be necessary for epidemiological  and / or clinical management purposes  to differentiate between  SARS-CoV-2 and other Sarbecovirus currently known to infect humans.  If clinically indicated additional testing with an alternate test  methodology [phone]) is advised. The SARS-CoV-2 RNA is generally  detectable in upper and lower respiratory sp ecimens during the acute  phase of infection. The expected result is Negative. Fact Sheet for Patients:  BoilerBrush.com.cy Fact Sheet for Healthcare Providers: https://pope.com/ This test is not yet approved or cleared by the [place]  FDA and has been authorized for detection and/or diagnosis of SARS-CoV-2 by FDA under an Emergency Use Authorization (EUA).  This EUA will remain in effect (meaning this test can be used) for the duration of the COVID-19 declaration under Section 564(b)(1) of the Act, 21 U.S.C. section 360bbb-3(b)(1), unless the authorization is terminated or revoked sooner. Performed at [facility], [address] [place], [place] [address]         Code Status Orders  (From admission, onward)         Start     Ordered   06/08/18 1659  Do not attempt resuscitation (DNR)  Continuous    Question Answer Comment  In the event of cardiac or respiratory ARREST Do not call a "code blue"   In the event of cardiac or respiratory ARREST Do not perform Intubation, CPR, defibrillation or ACLS   In the event of cardiac or respiratory ARREST Use medication by any route, position, wound care, and other measures to relive pain and suffering. May use oxygen, suction and manual treatment of airway obstruction as needed for comfort.      06/08/18 1658        Code Status History    Date Active Date Inactive Code Status Order ID Comments User Context   06/01/2018 2046 06/08/2018 1658 Full Code [id number]  Houston Siren, MD Inpatient    Advance Directive Documentation     Most Recent Value  Type of Advance Directive  Out of facility DNR (pink MOST or yellow form)  Pre-existing out of facility DNR order (yellow form or pink MOST form)  Pink MOST form placed in chart (order not valid for inpatient use)  "MOST" Form in Place?  -          Follow-up Information    Vida Rigger, MD On 06/16/2018.   Specialty:  Pulmonary Disease Why:  appointment at [facility] information: [address] [place] [address] [phone]           Discharge Medications   Allergies as of 06/09/2018      Reactions   Heparin       Medication List    TAKE these medications   acetaminophen 500  MG tablet Commonly known as:  TYLENOL Take 1,000 mg by mouth at bedtime.   albuterol (2.5 MG/3ML) 0.083% nebulizer solution Commonly known as:  PROVENTIL Take 3 mLs (2.5 mg total) by nebulization every 2 (two) hours as needed for wheezing or shortness of breath.   budesonide  0.5 MG/2ML nebulizer solution Commonly known as:  PULMICORT Take 2 mLs (0.5 mg total) by nebulization 2 (two) times daily.   guaifenesin 100 MG/5ML syrup Commonly known as:  ROBITUSSIN Take 10 mLs by mouth every 6 (six) hours as needed for cough.   LORazepam 0.5 MG tablet Commonly known as:  Ativan Take 1 tablet (0.5 mg total) by mouth every 8 (eight) hours as needed for anxiety.   metoprolol succinate 25 MG 24 hr tablet Commonly known as:  TOPROL-XL Take 25 mg by mouth daily.   morphine 20 MG/5ML solution Take 1.3 mLs (5.2 mg total) by mouth every 2 (two) hours as needed for pain.   OLANZapine 15 MG tablet Commonly known as:  ZYPREXA Take 30 mg by mouth daily.   omeprazole 20 MG capsule Commonly known as:  PRILOSEC Take 20 mg by mouth daily.   PARoxetine 25 MG 24 hr tablet Commonly known as:  PAXIL-CR Take 25 mg by mouth daily.   risperiDONE 2 MG tablet Commonly known as:  RISPERDAL Take 2 mg by mouth 2 (two) times daily.   rivaroxaban 20 MG Tabs tablet Commonly known as:  XARELTO Take 20 mg by mouth daily.   simvastatin 20 MG tablet Commonly known as:  ZOCOR Take 20 mg by mouth at bedtime.   triamcinolone cream 0.5 % Commonly known as:  KENALOG Apply 1 application topically 2 (two) times daily.   vitamin B-12 1000 MCG tablet Commonly known as:  CYANOCOBALAMIN Take 1,000 mcg by mouth daily.          Total Time in preparing paper work, data evaluation and todays exam - 35 minutes  Auburn Bilberry M.D on 06/09/2018 at 1:54 PM [facility]   Office  [phone]

## 2018-06-09 NOTE — TOC Progression Note (Signed)
Transition of Care [facility]) - Progression Note    Patient Details  Name: Anthony Chambers MRN: [id number] Date of Birth: [birth year]  Transition [facility] CM/SW Contact  Allayne Butcher, RN Phone Number: 06/09/2018, 1:44 PM  Clinical Narrative:    RNCM spoke with patient's sister Anthony Chambers.  Sister confirms plan for patient to discharge back to [facility] with hospice.  Dayna Barker with Hospice has been notified of referral, from her end patient is good to discharge.  Tina with [facility] aware of patient going back with hospice- COVID from today is negative.  Once discharge orders have been completed, RNCM will arrange for EMS transport.    Expected Discharge Plan: Home w Hospice Care(To [facility] with Hospice) Barriers to Discharge: Other (comment)(sister needs to choose hospice agency)  Expected Discharge Plan and Services Expected Discharge Plan: Home w Hospice Care(To [facility] with Hospice)   Discharge Planning Services: CM Consult Post Acute Care Choice: Hospice Living arrangements for the past 2 months: Skilled Nursing Facility                                       Social Determinants of Health (SDOH) Interventions    Readmission Risk Interventions Readmission Risk Prevention Plan 06/03/2018  Post Dischage Appt Complete  Medication Screening Complete  Transportation Screening Complete  Some recent data might be hidden

## 2018-06-09 NOTE — Progress Notes (Signed)
RN gave report to receiving nurse at [facility]. Pt taken via EMS with all belongings.

## 2018-06-11 NOTE — TOC Progression Note (Signed)
Transition of Care [facility]) - Progression Note    Patient Details  Name: Anthony Chambers MRN: [id number] Date of Birth: [birth year]  Transition of Care [facility]) CM/SW Contact  Collie Siad, RN Phone Number: 06/11/2018, 12:00 PM  Clinical Narrative:    Post discharge note entry 06/11/18 at 1200- Call from Tammy with [facility] requesting last BM documented while patient was in hospital. Per I & O flowsheet- 06/08/18 at 2100 it is documented "no incontinence per rectum source of bowel movement".  Patient is vomiting at this time per Tammy.    Expected Discharge Plan: Home w Hospice Care(To [facility] with Hospice) Barriers to Discharge: Barriers Resolved  Expected Discharge Plan and Services Expected Discharge Plan: Home w Hospice Care(To [facility] with Hospice)   Discharge Planning Services: CM Consult Post Acute Care Choice: Hospice Living arrangements for the past 2 months: Skilled Nursing Facility Expected Discharge Date: 06/09/18                                     Social Determinants of Health (SDOH) Interventions    Readmission Risk Interventions Readmission Risk Prevention Plan 06/03/2018  Post Dischage Appt Complete  Medication Screening Complete  Transportation Screening Complete  Some recent data might be hidden

## 2018-06-12 ENCOUNTER — Other Ambulatory Visit: Payer: Self-pay

## 2018-06-12 ENCOUNTER — Non-Acute Institutional Stay: Payer: Medicare Other | Admitting: Primary Care

## 2018-06-12 DIAGNOSIS — Z515 Encounter for palliative care: Secondary | ICD-10-CM

## 2018-06-12 NOTE — Progress Notes (Signed)
Therapist, [facility] Consult Note Telephone: [phone]  Fax: [phone]  TELEHEALTH VISIT STATEMENT Due to the COVID-19 crisis, this visit was done via telemedicine from my office. It was initiated and consented to by this patient and/or family.  PATIENT NAME: Anthony BastDanny C Lienemann DOB: June 28, 1945 MRN: [id number]  PRIMARY CARE PROVIDER:   Dorothey BasemanBronstein, David, MD  REFERRING PROVIDER:  Dorothey BasemanBronstein, David, MD [address] [place], [place] [address]  RESPONSIBLE PARTY:   Extended Emergency Contact Information Primary Emergency Contact: Eligah EastCTOR,SHEILA M Address: [address]          [place], [place] [address] Home Phone: [phone] Work Phone: [phone] Relation: Sister  Palliative Care was asked to follow patient by consultation request of Dorothey BasemanBronstein, David, MD. This is the initial  visit.  ASSESSMENT AND RECOMMENDATIONS:   1. Goals of Care:  Hospital had suggested hospice but sister has declined.  We discussed his recent hospitalization and decline in his physical function. Sister has not visited in over a year due to her health problems.  2. Symptom Management: CHF: recommend daily weights,  Lungs clear per RN,  even in light of  DOE. Oxygen at 3 L, started today.  Nutritional: Poor intake, Recommend supplement. Magic cup suggested. RN has generated dietary consultation.  Pain: C/o side rib pain, perhaps from constipation. Not able to further describe pain. Use PRN analgesia and monitor constipation.  3. Family Supports: Sister is POA, able to make decisions on behalf of patient.  She and her husband also experience health problems. She cannot visit due to health issues and her husband who usually visits has  Not been able to since Covid. They do talk on the phone. Also mentioned pt's long time roommate, Sherilyn CooterHenry, passed away in Feb. Which is a grief for Mr. Yeo.   4. Cognitive / Functional decline: Sleeps all day at baseline per staff  report, got up for bingo prior to the recent hospitalization. Family states his alertness is less than baseline.    5. Advanced Care Directive: FULL code. Asked about advance care planning, sister states this overwhelms here. Discussed the five wishes document, sister is willing to look it over and make some decisions. I will mail the Five Wishes booklet to them.   6. Follow up Palliative Care Visit: Palliative care will continue to follow for goals of care clarification and symptom management. Return 2-4  weeks or prn.  I spent 45 minutes providing this consultation,  from 1600 to 1645. More than 50% of the time in this consultation was spent coordinating communication.   HISTORY OF PRESENT ILLNESS:  Anthony Chambers is a 73 y.o. year old male with multiple medical problems including respiratory failure, CHF, depression, schizophrenia. Palliative Care was asked to help address goals of care.   CODE STATUS: FULL CODE  PPS: 30% HOSPICE ELIGIBILITY/DIAGNOSIS: TBD  PAST MEDICAL HISTORY:  Past Medical History:  Diagnosis Date  . Depression   . GERD (gastroesophageal reflux disease)   . Hyperlipidemia   . Hypertension   . Schizophrenia (HCC)   . Total occlusion of coronary artery, chronic     SOCIAL HX:  Social History   Tobacco Use  . Smoking status: Former Smoker    Years: 40.00    Types: Cigarettes  . Smokeless tobacco: Never Used  Substance Use Topics  . Alcohol use: Not Currently    ALLERGIES:  Allergies  Allergen Reactions  . Heparin  PERTINENT MEDICATIONS:  Outpatient Encounter Medications as of 06/12/2018  Medication Sig  . acetaminophen (TYLENOL) 500 MG tablet Take 1,000 mg by mouth at bedtime.  Marland Kitchen albuterol (PROVENTIL) (2.5 MG/3ML) 0.083% nebulizer solution Take 3 mLs (2.5 mg total) by nebulization every 2 (two) hours as needed for wheezing or shortness of breath.  . budesonide (PULMICORT) 0.5 MG/2ML nebulizer solution Take 2 mLs (0.5 mg total) by nebulization 2  (two) times daily.  Marland Kitchen guaifenesin (ROBITUSSIN) 100 MG/5ML syrup Take 10 mLs by mouth every 6 (six) hours as needed for cough.  Marland Kitchen LORazepam (ATIVAN) 0.5 MG tablet Take 1 tablet (0.5 mg total) by mouth every 8 (eight) hours as needed for anxiety.  . metoprolol succinate (TOPROL-XL) 25 MG 24 hr tablet Take 25 mg by mouth daily.  Marland Kitchen morphine 20 MG/5ML solution Take 1.3 mLs (5.2 mg total) by mouth every 2 (two) hours as needed for pain.  Marland Kitchen OLANZapine (ZYPREXA) 15 MG tablet Take 30 mg by mouth daily.  Marland Kitchen omeprazole (PRILOSEC) 20 MG capsule Take 20 mg by mouth daily.  Marland Kitchen PARoxetine (PAXIL-CR) 25 MG 24 hr tablet Take 25 mg by mouth daily.  . risperiDONE (RISPERDAL) 2 MG tablet Take 2 mg by mouth 2 (two) times daily.  . rivaroxaban (XARELTO) 20 MG TABS tablet Take 20 mg by mouth daily.  . simvastatin (ZOCOR) 20 MG tablet Take 20 mg by mouth at bedtime.  . triamcinolone cream (KENALOG) 0.5 % Apply 1 application topically 2 (two) times daily.  . vitamin B-12 (CYANOCOBALAMIN) 1000 MCG tablet Take 1,000 mcg by mouth daily.   No facility-administered encounter medications on file as of 06/12/2018.     PHYSICAL EXAM/ROS:   6'1", 107.9,  3 L =92% Current and past weights: 107 kg General: NAD, frail appearing, WNWD Cardiovascular: no chest pain reported, no edema,  Pulmonary: no cough, no increased SOB, DOE, Begun on oxygen at 3 L today. History of heavy smoking, stopped x 25 years. Abdomen: Appetite fair, 10-25% intake, denies constipation GU: denies dysuria MSK:  no joint deformities, walking with PT with walker Skin: buttock forme non pruritic rashes or wounds reported Neurological: Weakness, cognitive decline over baseline.  Paulina Fusi DNP, AGPCNP-BC

## 2018-06-19 ENCOUNTER — Emergency Department
Admission: EM | Admit: 2018-06-19 | Discharge: 2018-06-19 | Disposition: A | Discharge status: Home / Self Care | Payer: Medicare Other | Attending: Emergency Medicine | Admitting: Emergency Medicine

## 2018-06-19 ENCOUNTER — Other Ambulatory Visit: Payer: Self-pay

## 2018-06-19 ENCOUNTER — Emergency Department: Payer: Medicare Other

## 2018-06-19 ENCOUNTER — Encounter: Payer: Self-pay | Admitting: Emergency Medicine

## 2018-06-19 DIAGNOSIS — J449 Chronic obstructive pulmonary disease, unspecified: Secondary | ICD-10-CM | POA: Diagnosis not present

## 2018-06-19 DIAGNOSIS — I110 Hypertensive heart disease with heart failure: Secondary | ICD-10-CM | POA: Insufficient documentation

## 2018-06-19 DIAGNOSIS — Z79899 Other long term (current) drug therapy: Secondary | ICD-10-CM | POA: Insufficient documentation

## 2018-06-19 DIAGNOSIS — I509 Heart failure, unspecified: Secondary | ICD-10-CM | POA: Insufficient documentation

## 2018-06-19 DIAGNOSIS — Z87891 Personal history of nicotine dependence: Secondary | ICD-10-CM | POA: Diagnosis not present

## 2018-06-19 DIAGNOSIS — Z7901 Long term (current) use of anticoagulants: Secondary | ICD-10-CM | POA: Insufficient documentation

## 2018-06-19 DIAGNOSIS — R0602 Shortness of breath: Secondary | ICD-10-CM

## 2018-06-19 LAB — CBC WITH DIFFERENTIAL/PLATELET
Abs Immature Granulocytes: 0.02 10*3/uL (ref 0.00–0.07)
Basophils Absolute: 0 10*3/uL (ref 0.0–0.1)
Basophils Relative: 0 %
Eosinophils Absolute: 0.1 10*3/uL (ref 0.0–0.5)
Eosinophils Relative: 1 %
HCT: 44.4 % (ref 39.0–52.0)
Hemoglobin: 13.6 g/dL (ref 13.0–17.0)
Immature Granulocytes: 0 %
Lymphocytes Relative: 16 %
Lymphs Abs: 1 10*3/uL (ref 0.7–4.0)
MCH: 25.8 pg — ABNORMAL LOW (ref 26.0–34.0)
MCHC: 30.6 g/dL (ref 30.0–36.0)
MCV: 84.3 fL (ref 80.0–100.0)
Monocytes Absolute: 0.7 10*3/uL (ref 0.1–1.0)
Monocytes Relative: 11 %
Neutro Abs: 4.4 10*3/uL (ref 1.7–7.7)
Neutrophils Relative %: 72 %
Platelets: 181 10*3/uL (ref 150–400)
RBC: 5.27 MIL/uL (ref 4.22–5.81)
RDW: 15.2 % (ref 11.5–15.5)
WBC: 6.1 10*3/uL (ref 4.0–10.5)
nRBC: 0 % (ref 0.0–0.2)

## 2018-06-19 LAB — COMPREHENSIVE METABOLIC PANEL
ALT: 7 U/L (ref 0–44)
AST: 14 U/L — ABNORMAL LOW (ref 15–41)
Albumin: 3.1 g/dL — ABNORMAL LOW (ref 3.5–5.0)
Alkaline Phosphatase: 78 U/L (ref 38–126)
Anion gap: 5 (ref 5–15)
BUN: 17 mg/dL (ref 8–23)
CO2: 33 mmol/L — ABNORMAL HIGH (ref 22–32)
Calcium: 8.3 mg/dL — ABNORMAL LOW (ref 8.9–10.3)
Chloride: 103 mmol/L (ref 98–111)
Creatinine, Ser: 1.04 mg/dL (ref 0.61–1.24)
GFR calc Af Amer: >60 mL/min (ref >60)
GFR calc non Af Amer: >60 mL/min (ref >60)
Glucose, Bld: 101 mg/dL — ABNORMAL HIGH (ref 70–99)
Potassium: 4.4 mmol/L (ref 3.5–5.1)
Sodium: 141 mmol/L (ref 135–145)
Total Bilirubin: 0.6 mg/dL (ref 0.3–1.2)
Total Protein: 6.9 g/dL (ref 6.5–8.1)

## 2018-06-19 LAB — BRAIN NATRIURETIC PEPTIDE: B Natriuretic Peptide: 308 pg/mL — ABNORMAL HIGH (ref 0.0–100.0)

## 2018-06-19 LAB — TROPONIN I: Troponin I: <0.03 ng/mL (ref <0.03)

## 2018-06-19 NOTE — ED Provider Notes (Signed)
[facility] Emergency Department Provider Note  Time seen: 10:17 AM  I have reviewed the triage vital signs and the nursing notes.   HISTORY  Chief Complaint Shortness of Breath   HPI Anthony Chambers is a 73 y.o. male with a past medical history of depression, gastric reflux, hypertension, hyperlipidemia, schizophrenia, presents to the emergency department for shortness of breath.  According to EMS patient is coming from [facility], for shortness of breath.  Staff states the patient was satting in the 70s on his normal 3.5 L of oxygen.  EMS states they placed the patient on 4 L of oxygen upon arrival with sats greater than 95%.  Patient has a history of COPD as well as CHF, recent admission for CHF exacerbation.  Negative COVID test 2 days ago per EMS.  Patient denies any cough or fever.  Currently the patient appears well, does state intermittent shortness of breath.  Denies any fever.  Denies any increased cough.  No chest pain.   Past Medical History:  Diagnosis Date  . Depression   . GERD (gastroesophageal reflux disease)   . Hyperlipidemia   . Hypertension   . Schizophrenia (HCC)   . Total occlusion of coronary artery, chronic     Patient Active Problem List   Diagnosis Date Noted  . Acute respiratory failure with hypoxia and hypercapnia (HCC) 06/01/2018    History reviewed. No pertinent surgical history.  Prior to Admission medications   Medication Sig Start Date End Date Taking? Authorizing Provider  acetaminophen (TYLENOL) 500 MG tablet Take 1,000 mg by mouth at bedtime.    [provider]  albuterol (PROVENTIL) (2.5 MG/3ML) 0.083% nebulizer solution Take 3 mLs (2.5 mg total) by nebulization every 2 (two) hours as needed for wheezing or shortness of breath. 06/09/18   Auburn BilberryPatel, Shreyang, MD  budesonide (PULMICORT) 0.5 MG/2ML nebulizer solution Take 2 mLs (0.5 mg total) by nebulization 2 (two) times daily. 06/09/18   Auburn BilberryPatel, Shreyang, MD   guaifenesin (ROBITUSSIN) 100 MG/5ML syrup Take 10 mLs by mouth every 6 (six) hours as needed for cough.    [provider]  LORazepam (ATIVAN) 0.5 MG tablet Take 1 tablet (0.5 mg total) by mouth every 8 (eight) hours as needed for anxiety. 06/09/18 06/09/19  Auburn BilberryPatel, Shreyang, MD  metoprolol succinate (TOPROL-XL) 25 MG 24 hr tablet Take 25 mg by mouth daily.    [provider]  morphine 20 MG/5ML solution Take 1.3 mLs (5.2 mg total) by mouth every 2 (two) hours as needed for pain. 06/09/18   Auburn BilberryPatel, Shreyang, MD  OLANZapine (ZYPREXA) 15 MG tablet Take 30 mg by mouth daily.    [provider]  omeprazole (PRILOSEC) 20 MG capsule Take 20 mg by mouth daily.    [provider]  PARoxetine (PAXIL-CR) 25 MG 24 hr tablet Take 25 mg by mouth daily.    [provider]  risperiDONE (RISPERDAL) 2 MG tablet Take 2 mg by mouth 2 (two) times daily.    [provider]  rivaroxaban (XARELTO) 20 MG TABS tablet Take 20 mg by mouth daily.    [provider]  simvastatin (ZOCOR) 20 MG tablet Take 20 mg by mouth at bedtime.    [provider]  triamcinolone cream (KENALOG) 0.5 % Apply 1 application topically 2 (two) times daily.    [provider]  vitamin B-12 (CYANOCOBALAMIN) 1000 MCG tablet Take 1,000 mcg by mouth daily.    [provider]    Allergies  Allergen Reactions  . Heparin     Family History  Problem Relation Age of Onset  . Asthma Mother     Social History Social History   Tobacco Use  . Smoking status: Former Smoker    Years: 40.00    Types: Cigarettes  . Smokeless tobacco: Never Used  Substance Use Topics  . Alcohol use: Not Currently  . Drug use: Never    Review of Systems Constitutional: Negative for fever. ENT: Negative for recent illness/congestion Cardiovascular: Negative for chest pain. Respiratory: Positive for shortness of breath.  Wears 3.5 L of oxygen 24/7. Gastrointestinal: Negative for  abdominal pain Musculoskeletal: Negative for musculoskeletal complaints Skin: Negative for skin complaints   Neurological: Negative for headache All other ROS negative  ____________________________________________   PHYSICAL EXAM:  VITAL SIGNS: ED Triage Vitals  Enc Vitals Group     BP --      Pulse --      Resp --      Temp 06/19/18 1000 97.9 F (36.6 C)     Temp Source 06/19/18 1000 Oral     SpO2 --      Weight 06/19/18 1001 238 lb 1.6 oz (108 kg)     Height 06/19/18 1001 6\' 1"  (1.854 m)     Head Circumference --      Peak Flow --      Pain Score 06/19/18 1000 0     Pain Loc --      Pain Edu? --      Excl. in [place]? --    Constitutional: Alert and oriented. Well appearing and in no distress. Eyes: Normal exam ENT      Head: Normocephalic and atraumatic.      Mouth/Throat: Mucous membranes are moist. Cardiovascular: Normal rate, regular rhythm.  Respiratory: Normal respiratory effort without tachypnea nor retractions. Breath sounds are clear Gastrointestinal: Soft and nontender. No distention.  Obese. Musculoskeletal: Nontender with normal range of motion in all extremities.  Positive for lower extreme edema bilaterally. Neurologic:  Normal speech and language. No gross focal neurologic deficits Skin:  Skin is warm, dry and intact.  Psychiatric: Mood and affect are normal.   ____________________________________________    EKG  EKG viewed and interpreted by myself shows a normal sinus rhythm at 56 bpm with a narrow QRS, normal axis, normal intervals, nonspecific ST changes.  ____________________________________________    RADIOLOGY  IMPRESSION:  Suggestion mild vascular congestion.   ____________________________________________   INITIAL IMPRESSION / ASSESSMENT AND PLAN / ED COURSE  Pertinent labs & imaging results that were available during my care of the patient were reviewed by me and considered in my medical decision making (see chart for details).    Patient presents emergency department for shortness of breath.  Per EMS staff members state a saturation in the 70s on 3.5 L, EMS placed patient on 4 L, upon arrival patient satting greater than 95% without further intervention.  Patient currently on 3.5 L satting in the mid 90s in the emergency department.  Patient was discharged from the emergency department 6 last 1/20 after admission for hypoxia/hypercapnia due to a CHF/COPD exacerbation.  We will check labs, chest x-ray and continue to closely monitor.  Patient's labs are largely at baseline/reassuring.  BNP of 300 although significantly decreased from 2 weeks ago.  Troponin is negative.  Chest x-ray shows mild vascular congestion but no edema.  Patient continues to sat between 97 and 99% on 3.5 L which is the patient's baseline.  In  reading the patient's notes from his prior discharge they were going to involve hospice due to his progressive illness, he is now a DNR.  Overall the patient's work-up is reassuring I believe he is safe for discharge back to his skilled nursing facility.  Possibly experienced mucous plugging earlier, is not entirely clear why he was hypoxic but remains well currently.  Anthony Chambers was evaluated in Emergency Department on 06/19/2018 for the symptoms described in the history of present illness. He was evaluated in the context of the global COVID-19 pandemic, which necessitated consideration that the patient might be at risk for infection with the SARS-CoV-2 virus that causes COVID-19. Institutional protocols and algorithms that pertain to the evaluation of patients at risk for COVID-19 are in a state of rapid change based on information released by regulatory bodies including the CDC and federal and state organizations. These policies and algorithms were followed during the patient's care in the ED.  ____________________________________________   FINAL CLINICAL IMPRESSION(S) / ED DIAGNOSES  Dyspnea   Minna AntisPaduchowski,  Emet Rafanan, MD 06/19/18 1130

## 2018-06-19 NOTE — ED Notes (Signed)

## 2018-06-19 NOTE — ED Notes (Signed)
Report called to Kim at [facility].

## 2018-06-19 NOTE — ED Triage Notes (Addendum)
Pt presents to ED via [facility] from [facility] c/o SOB. SNF staff called EMS d/t pt O2 sat in 70s on chronic 3.5L O2. EMS increased O2 to 4L with sat >95%. Hx COPD, CHF, recent admission for same. EMS report negative COVID-19 test 2 days ago and recent positive D-Dimer. Pt on lasix and xarelto. No c/o cough or fever.

## 2018-07-02 ENCOUNTER — Non-Acute Institutional Stay: Payer: Medicare Other | Admitting: Primary Care

## 2018-07-02 ENCOUNTER — Other Ambulatory Visit: Payer: Self-pay

## 2018-07-03 ENCOUNTER — Non-Acute Institutional Stay: Payer: Medicare Other | Admitting: Primary Care

## 2018-07-03 ENCOUNTER — Other Ambulatory Visit: Payer: Self-pay

## 2018-07-09 ENCOUNTER — Non-Acute Institutional Stay: Payer: Medicare Other | Admitting: Primary Care

## 2018-07-09 NOTE — Progress Notes (Signed)
Therap[facility], [facility] Consult Note Telephone: [phone]  Fax: [phone]  TELEHEALTH VISIT STATEMENT Due to the COVID-19 crisis, this visit was done via telemedicine from my office. It was initiated and consented to by this patient and/or family.  PATIENT NAME: Anthony BastDanny C Fizer DOB: 16-Jun-1945 MRN: [id number]  PRIMARY CARE PROVIDER:   Dorothey BasemanBronstein, David, MD [phone]  REFERRING PROVIDER:  Dorothey BasemanBronstein, David, MD [address] [place] [address] [phone]  RESPONSIBLE PARTY:   Extended Emergency Contact Information Primary Emergency Contact: Eligah EastCTOR,SHEILA M Address: [address]          [place], [place] [address] Home Phone: [phone] Work Phone: [phone] Relation: Sister  Palliative Care was asked to follow this patient by consultation request of Dorothey BasemanBronstein, David, MD. This is a follow up visit.  ASSESSMENT AND RECOMMENDATIONS:   1. Goals of Care: Maximize quality of life and symptom management.  2. Symptom Management:   Dyspnea; Recently hospitalized for dyspnea, was able to move mucous plug and resolved fairly quickly, with sats returning to normal. He states no further problems with airway patency  Nutrition; Eats 50% of meals, has had weight gain in the past month of 12 lbs. Follow for possible fluid excess.  Pain: Denies discomfort now, no more rib pain.  3. Family /Caregiver/Community Supports: Sister is POA and not able to visit due to her own health issues. Not able to video as she has no device. Pt has recently been moved to a new room and new roommate.  4. Cognitive / Functional decline: Slow decline, less interactive, sleeping more.  5. Advanced Care Directive: FULL CODE. , I sent 5 wishes to sister a month ago, I will reach out to see if she's been able to make any directives.  6. Follow up Palliative Care Visit: Palliative care will continue to follow for goals of care clarification and  symptom management. Return 4-6 weeks or prn.  I spent 25 minutes providing this consultation,  from 0900 to 0925. More than 50% of the time in this consultation was spent coordinating communication.   HISTORY OF PRESENT ILLNESS:  Anthony Chambers is a 73 y.o. year old male with multiple medical problems including  respiratory failure, CHF, depression, schizophrenia. Palliative Care was asked to help address goals of care.   CODE STATUS: FULL code  PPS: 30% HOSPICE ELIGIBILITY/DIAGNOSIS: TBD  PAST MEDICAL HISTORY:  Past Medical History:  Diagnosis Date  . Depression   . GERD (gastroesophageal reflux disease)   . Hyperlipidemia   . Hypertension   . Schizophrenia (HCC)   . Total occlusion of coronary artery, chronic     SOCIAL HX:  Social History   Tobacco Use  . Smoking status: Former Smoker    Years: 40.00    Types: Cigarettes  . Smokeless tobacco: Never Used  Substance Use Topics  . Alcohol use: Not Currently    ALLERGIES:  Allergies  Allergen Reactions  . Heparin      PERTINENT MEDICATIONS:  Outpatient Encounter Medications as of 07/10/2018  Medication Sig  . acetaminophen (TYLENOL) 500 MG tablet Take 1,000 mg by mouth at bedtime.  Marland Kitchen. albuterol (PROVENTIL) (2.5 MG/3ML) 0.083% nebulizer solution Take 3 mLs (2.5 mg total) by nebulization every 2 (two) hours as needed for wheezing or shortness of breath.  . budesonide (PULMICORT) 0.5 MG/2ML nebulizer solution Take 2 mLs (0.5 mg total) by nebulization 2 (two) times daily.  Marland Kitchen. guaifenesin (ROBITUSSIN) 100 MG/5ML syrup  Take 10 mLs by mouth every 6 (six) hours as needed for cough.  Marland Kitchen LORazepam (ATIVAN) 0.5 MG tablet Take 1 tablet (0.5 mg total) by mouth every 8 (eight) hours as needed for anxiety.  . metoprolol succinate (TOPROL-XL) 25 MG 24 hr tablet Take 25 mg by mouth daily.  Marland Kitchen morphine 20 MG/5ML solution Take 1.3 mLs (5.2 mg total) by mouth every 2 (two) hours as needed for pain.  Marland Kitchen OLANZapine (ZYPREXA) 15 MG tablet Take 30 mg  by mouth daily.  Marland Kitchen omeprazole (PRILOSEC) 20 MG capsule Take 20 mg by mouth daily.  Marland Kitchen PARoxetine (PAXIL-CR) 25 MG 24 hr tablet Take 25 mg by mouth daily.  . risperiDONE (RISPERDAL) 2 MG tablet Take 2 mg by mouth 2 (two) times daily.  . rivaroxaban (XARELTO) 20 MG TABS tablet Take 20 mg by mouth daily.  . simvastatin (ZOCOR) 20 MG tablet Take 20 mg by mouth at bedtime.  . triamcinolone cream (KENALOG) 0.5 % Apply 1 application topically 2 (two) times daily.  . vitamin B-12 (CYANOCOBALAMIN) 1000 MCG tablet Take 1,000 mcg by mouth daily.   No facility-administered encounter medications on file as of 07/10/2018.     PHYSICAL EXAM/ROS:   VS per SNF staff 111/54, 127/72 P 72, oxygen 97% , Resp 20, 97.9  Current and past weights: 235 on 6/18, 238 lb on 06/19/2018 247 lb, 07/10/2018 General: NAD, frail appearing, pain in bottom Cardiovascular: no chest pain reported, no edema,  Pulmonary: +  cough, no increased SOB, Oxygen 3 L Abdomen: appetite 50% endorses constipation, incontinent of bowel GU: denies dysuria, incontinent of urine MSK:  no joint deformities Skin: no rashes or wounds reported, sacrum sore, has chronic skin plaques. Neurological: Weakness, h/o cognitive deficits, sleep fair, has CPAP   [place], AGPCNP-BC

## 2018-07-10 ENCOUNTER — Non-Acute Institutional Stay: Payer: Medicare Other | Admitting: Primary Care

## 2018-07-10 ENCOUNTER — Other Ambulatory Visit: Payer: Self-pay

## 2018-07-10 DIAGNOSIS — Z515 Encounter for palliative care: Secondary | ICD-10-CM

## 2018-08-20 ENCOUNTER — Other Ambulatory Visit: Payer: Self-pay

## 2018-08-20 ENCOUNTER — Non-Acute Institutional Stay: Payer: Medicare Other | Admitting: Primary Care

## 2018-08-20 DIAGNOSIS — Z515 Encounter for palliative care: Secondary | ICD-10-CM

## 2018-08-20 NOTE — Progress Notes (Signed)
[facility] Consult Note Telephone: [phone]  Fax: [phone]  TELEHEALTH VISIT STATEMENT Due to the COVID-19 crisis, this visit was done via telemedicine from my office. It was initiated and consented to by this patient and/or family.  PATIENT NAME: Anthony Chambers Hankey DOB: 14-Sep-1945 MRN: [id number]  PRIMARY CARE PROVIDER: Dr. Terance HartBronstein,     [address] [place] [address]   REFERRING PROVIDER: Dorothey BasemanBronstein, David, MD    [address] [place] [address]   RESPONSIBLE PARTY:   Extended Emergency Contact Information Primary Emergency Contact: Eligah EastCTOR,SHEILA M Address: [address]          [place], [place] [address] Home Phone: [phone] Work Phone: [phone] Relation: Sister ASSESSMENT AND RECOMMENDATIONS:   1. Advance Care Planning/Goals of Care: Goals include to maximize quality of life and symptom management. I discussed with his sister advance care planning and the MOST form. She is in the hospital herself has been asked herself about MOST so she is now more familiar.  She did not remember receiving the [facility] brochure from me. We discussed the reason for having a living well and a MOST. We discussed doing this for Mr. Cedric FishmanSharpe soon, after her surgery and when she's back home.  2. Symptom Management:   Constipation: Reports this but is having bowel movements. He complain that depression meds are creating this but staff does not see this. Asked staff to check for consistency and effort and request miralax if his stools are hard or require great effort to have.  Pain: Controlled and can use PRNs.   3. Family /Caregiver/Community Supports: Sister is POA but is ill with health issues.[facility]. She will try to phone him tonight.His phone was off the hook and LPN  helped get the phone to his table.  4. Cognitive / Functional decline: Appears improved, he is sitting in chair and dressed. Has participated in bingo he  appears at his cognitive and functional baseline.   5. Follow up Palliative Care Visit: Palliative care will continue to follow for goals of care clarification and symptom management. Return 8-10 weeks or prn.  I spent 35 minutes providing this consultation,  from 1400 to 1435. More than 50% of the time in this consultation was spent coordinating communication.   HISTORY OF PRESENT ILLNESS:  Anthony Chambers Desjardin is a 73 y.o. year old male with multiple medical problems including respiratory failure, CHF, depression, schizophrenia. Palliative Care was asked to follow this patient by consultation request of Dr. Terance HartBronstein to help address advance care planning and goals of care. This is a follow up visit.  CODE STATUS: FULL  PPS: 30% HOSPICE ELIGIBILITY/DIAGNOSIS: TBD  PAST MEDICAL HISTORY:  Past Medical History:  Diagnosis Date  . Depression   . GERD (gastroesophageal reflux disease)   . Hyperlipidemia   . Hypertension   . Schizophrenia (HCC)   . Total occlusion of coronary artery, chronic     SOCIAL HX:  Social History   Tobacco Use  . Smoking status: Former Smoker    Years: 40.00    Types: Cigarettes  . Smokeless tobacco: Never Used  Substance Use Topics  . Alcohol use: Not Currently    ALLERGIES:  Allergies  Allergen Reactions  . Heparin      PERTINENT MEDICATIONS:  Outpatient Encounter Medications as of 08/20/2018  Medication Sig  . acetaminophen (TYLENOL) 500 MG tablet Take 1,000 mg by mouth at bedtime.  Marland Kitchen. albuterol (PROVENTIL) (  2.5 MG/3ML) 0.083% nebulizer solution Take 3 mLs (2.5 mg total) by nebulization every 2 (two) hours as needed for wheezing or shortness of breath.  . budesonide (PULMICORT) 0.5 MG/2ML nebulizer solution Take 2 mLs (0.5 mg total) by nebulization 2 (two) times daily.  Marland Kitchen guaifenesin (ROBITUSSIN) 100 MG/5ML syrup Take 10 mLs by mouth every 6 (six) hours as needed for cough.  Marland Kitchen LORazepam (ATIVAN) 0.5 MG tablet Take 1 tablet (0.5 mg total) by mouth every 8  (eight) hours as needed for anxiety.  . metoprolol succinate (TOPROL-XL) 25 MG 24 hr tablet Take 25 mg by mouth daily.  Marland Kitchen morphine 20 MG/5ML solution Take 1.3 mLs (5.2 mg total) by mouth every 2 (two) hours as needed for pain.  Marland Kitchen OLANZapine (ZYPREXA) 15 MG tablet Take 30 mg by mouth daily.  Marland Kitchen omeprazole (PRILOSEC) 20 MG capsule Take 20 mg by mouth daily.  Marland Kitchen PARoxetine (PAXIL-CR) 25 MG 24 hr tablet Take 25 mg by mouth daily.  . risperiDONE (RISPERDAL) 2 MG tablet Take 2 mg by mouth 2 (two) times daily.  . rivaroxaban (XARELTO) 20 MG TABS tablet Take 20 mg by mouth daily.  . simvastatin (ZOCOR) 20 MG tablet Take 20 mg by mouth at bedtime.  . triamcinolone cream (KENALOG) 0.5 % Apply 1 application topically 2 (two) times daily.  . vitamin B-12 (CYANOCOBALAMIN) 1000 MCG tablet Take 1,000 mcg by mouth daily.   No facility-administered encounter medications on file as of 08/20/2018.     PHYSICAL EXAM / ROS:   Current and past weights: Current  243 lb weights are qod for edema,  235 lb on 6/18, 238 lb on 06/19/2018 and 247 lb, 07/10/2018  General: NAD, frail appearing, obese Cardiovascular: no chest pain reported, + edema Pulmonary: no cough, no increased SOB, has oxygen in place Abdomen: appetite fair, endorses constipation, incontinent of bowel GU: denies dysuria, incontinent of urine MSK:  no joint deformities, non-ambulatory, in w/Chambers Skin: no rashes or wounds reported Neurological: Weakness, cognitive age approx 6-7 per sister's report.  Cyndia Skeeters DNP AGPCNP-BC

## 2018-10-22 ENCOUNTER — Other Ambulatory Visit: Payer: Self-pay

## 2018-10-22 ENCOUNTER — Non-Acute Institutional Stay: Payer: Medicare Other | Admitting: Primary Care

## 2018-10-22 DIAGNOSIS — Z515 Encounter for palliative care: Secondary | ICD-10-CM

## 2018-10-22 NOTE — Progress Notes (Signed)
[facility] Palliative Care Consult Note Telephone: [phone]  Fax: [phone]  TELEHEALTH VISIT STATEMENT Due to the COVID-19 crisis, this visit was done via telemedicine from my office. It was initiated and consented to by this patient and/or family.  PATIENT NAME: Anthony Chambers [address] [place] [address] [phone] (home)  DOB: Oct 20, 1945 MRN: [id number]  PRIMARY CARE PROVIDER:   Juluis Pitch, MD, [address] [place] [address] [phone]  REFERRING PROVIDER:  Juluis Pitch, MD [address] [place],  [place] [address] [phone]  RESPONSIBLE PARTY:   Extended Emergency Contact Information Primary Emergency Contact: Eddie Candle Address: [address]          [place], [place] [address] Home Phone: [phone] Work Phone: [phone] Relation: Sister  ASSESSMENT AND RECOMMENDATIONS:   1. Advance Care Planning/Goals of Care: Goals include to maximize quality of life and symptom management. Full code per family.   2. Symptom Management:   Insomnia: Reports some insomnia, recommend changing olanzepine to at hs, now is given in AM, and adding melatonin as needed.  Incontinence: Voiced concerns with this. He may benefit from timed voids per staff.  3. Family /Caregiver/Community Supports: Lives in [place], appears comfortable and happy. States he does not speak with his sister much. She has had some recent health problems.  4. Cognitive / Functional decline: Alert and oriented and at his baseline. Needs assistance with most adls.  5. Follow up Palliative Care Visit: Palliative care will continue to follow for goals of care clarification and symptom management. Return 6-8 weeks or prn.  I spent 25 minutes providing this consultation,  from 1400 to 1425. More than 50% of the time in this consultation was spent coordinating communication.   HISTORY OF PRESENT ILLNESS:  Anthony Chambers is a 73 y.o. year old male with multiple medical  problems including multiple medical problems including respiratory failure, CHF, depression, schizophrenia. Palliative Care was asked to follow this patient by consultation request of Juluis Pitch, MD to help address advance care planning and goals of care. This is a follow up visit.  CODE STATUS: FULL  PPS: 40% HOSPICE ELIGIBILITY/DIAGNOSIS: TBD  PAST MEDICAL HISTORY:  Past Medical History:  Diagnosis Date  . Depression   . GERD (gastroesophageal reflux disease)   . Hyperlipidemia   . Hypertension   . Schizophrenia ([place])   . Total occlusion of coronary artery, chronic     SOCIAL HX:  Social History   Tobacco Use  . Smoking status: Former Smoker    Years: 40.00    Types: Cigarettes  . Smokeless tobacco: Never Used  Substance Use Topics  . Alcohol use: Not Currently    ALLERGIES:  Allergies  Allergen Reactions  . Heparin      PERTINENT MEDICATIONS:  Outpatient Encounter Medications as of 10/22/2018  Medication Sig  . acetaminophen (TYLENOL) 500 MG tablet Take 1,000 mg by mouth at bedtime.  Marland Kitchen albuterol (PROVENTIL) (2.5 MG/3ML) 0.083% nebulizer solution Take 3 mLs (2.5 mg total) by nebulization every 2 (two) hours as needed for wheezing or shortness of breath.  . budesonide (PULMICORT) 0.5 MG/2ML nebulizer solution Take 2 mLs (0.5 mg total) by nebulization 2 (two) times daily.  Marland Kitchen guaifenesin (ROBITUSSIN) 100 MG/5ML syrup Take 10 mLs by mouth every 6 (six) hours as needed for cough.  Marland Kitchen LORazepam (ATIVAN) 0.5 MG tablet Take 1 tablet (0.5 mg total) by mouth every 8 (eight) hours as needed for anxiety.  . metoprolol succinate (TOPROL-XL) 25 MG  24 hr tablet Take 25 mg by mouth daily.  Marland Kitchen morphine 20 MG/5ML solution Take 1.3 mLs (5.2 mg total) by mouth every 2 (two) hours as needed for pain.  Marland Kitchen OLANZapine (ZYPREXA) 15 MG tablet Take 30 mg by mouth daily.  Marland Kitchen omeprazole (PRILOSEC) 20 MG capsule Take 20 mg by mouth daily.  Marland Kitchen PARoxetine (PAXIL-CR) 25 MG 24 hr tablet Take 25 mg by  mouth daily.  . risperiDONE (RISPERDAL) 2 MG tablet Take 2 mg by mouth 2 (two) times daily.  . rivaroxaban (XARELTO) 20 MG TABS tablet Take 20 mg by mouth daily.  . simvastatin (ZOCOR) 20 MG tablet Take 20 mg by mouth at bedtime.  . triamcinolone cream (KENALOG) 0.5 % Apply 1 application topically 2 (two) times daily.  . vitamin B-12 (CYANOCOBALAMIN) 1000 MCG tablet Take 1,000 mcg by mouth daily.   No facility-administered encounter medications on file as of 10/22/2018.     PHYSICAL EXAM / ROS:   Current and past weights: 250 lbs General: NAD, frail appearing, obese Cardiovascular: no chest pain reported, no edema reported  Pulmonary: no cough, no increased SOB,  No reported DOE Abdomen: appetite good, endorses constipation, incontinent of bowel GU: denies dysuria, incontinent of urine at times MSK:  no joint deformities, non ambulatory, pivots in w/c Skin: no rashes or wounds reported Neurological: Weakness, sleep is fair per his report  Marijo File DNP AGPCNP-BC

## 2018-12-12 ENCOUNTER — Other Ambulatory Visit: Payer: Self-pay

## 2018-12-12 ENCOUNTER — Non-Acute Institutional Stay: Payer: Medicare Other | Admitting: Primary Care

## 2018-12-12 DIAGNOSIS — Z515 Encounter for palliative care: Secondary | ICD-10-CM

## 2018-12-12 NOTE — Progress Notes (Signed)
Therapist, nutritional Palliative Care Consult Note Telephone: [phone]  Fax: [phone]  TELEHEALTH VISIT STATEMENT Due to the COVID-19 crisis, this visit was done via telemedicine from my office. It was initiated and consented to by this patient and/or family.  PATIENT NAME: Anthony Chambers [address] [place] [address] [phone] (home)  DOB: March 16, 1945 MRN: [id number]  PRIMARY CARE PROVIDER:   Dorothey Baseman, MD, [address] [place] [address] [phone]  REFERRING PROVIDER:  Dorothey Baseman, MD [address] [place],  [place] [address] [phone]  RESPONSIBLE PARTY:   Extended Emergency Contact Information Primary Emergency Contact: Eligah East Address: [address]          [place], [place] [address] Home Phone: [phone] Work Phone: [phone] Relation: Sister   ASSESSMENT AND RECOMMENDATIONS:   1. Advance Care Planning/Goals of Care: Goals include to maximize quality of life and symptom management. Has FULL scope of care. Reached out to sister for questions/ concerns but mailbox was full and did not allow message left.   2. Symptom Management:   Insomnia: States he still has problem sleeping at night. Recommended melatonin 10 mg at hs.  And has changed zyprexa to hs. Cautioned against daytime napping. Instructed to keep busy during day and not sleep, and get back on a nocturnal sleep schedule.  Constipation: Recommend to add miralax 17 gm daily to regimen. Has had increase on  10/19/18 to  senna bid but has been on bm protocol often.   Fatigue: States he is tired a lot. Recently recovering from Covid.  3. Family /Caregiver/Community Supports: Has sister who is POA.She is in poor health. He speaks with her on the phone some and is aware of her health issues. Lives in LTC.   4. Cognitive / Functional decline:  Alert and oriented x 2-3, needs assistance with most adls. Appears to have lost weight on clinical exam. Staff with verify weight.   5. Follow up Palliative Care Visit: Palliative care will continue to follow for goals of care clarification and symptom management. Return 6 weeks or prn.  I spent 25 minutes providing this consultation,  from 1330 to 1355. More than 50% of the time in this consultation was spent coordinating communication.   HISTORY OF PRESENT ILLNESS:  Anthony Chambers is a 73 y.o. year old male with multiple medical problems including multiple medical problems including respiratory failure, CHF, depression, schizophrenia. Palliative Care was asked to follow this patient by consultation request of Dorothey Baseman, MD to help address advance care planning and goals of care. This is a follow up visit.  CODE STATUS: FULL  PPS: 30% HOSPICE ELIGIBILITY/DIAGNOSIS: TBD  PAST MEDICAL HISTORY:  Past Medical History:  Diagnosis Date  . Depression   . GERD (gastroesophageal reflux disease)   . Hyperlipidemia   . Hypertension   . Schizophrenia (HCC)   . Total occlusion of coronary artery, chronic     SOCIAL HX:  Social History   Tobacco Use  . Smoking status: Former Smoker    Years: 40.00    Types: Cigarettes  . Smokeless tobacco: Never Used  Substance Use Topics  . Alcohol use: Not Currently    ALLERGIES:  Allergies  Allergen Reactions  . Heparin      PERTINENT MEDICATIONS:  Outpatient Encounter Medications as of 12/12/2018  Medication Sig  . acetaminophen (TYLENOL) 500 MG tablet Take 1,000 mg by mouth at bedtime.  Marland Kitchen albuterol (PROVENTIL) (2.5 MG/3ML) 0.083% nebulizer solution Take 3 mLs (2.5 mg  total) by nebulization every 2 (two) hours as needed for wheezing or shortness of breath.  . budesonide (PULMICORT) 0.5 MG/2ML nebulizer solution Take 2 mLs (0.5 mg total) by nebulization 2 (two) times daily.  Marland Kitchen guaifenesin (ROBITUSSIN) 100 MG/5ML syrup Take 10 mLs by mouth every 6 (six) hours as needed for cough.  Marland Kitchen LORazepam (ATIVAN) 0.5 MG tablet Take 1 tablet (0.5 mg total) by mouth every 8 (eight)  hours as needed for anxiety.  . metoprolol succinate (TOPROL-XL) 25 MG 24 hr tablet Take 25 mg by mouth daily.  Marland Kitchen morphine 20 MG/5ML solution Take 1.3 mLs (5.2 mg total) by mouth every 2 (two) hours as needed for pain.  Marland Kitchen OLANZapine (ZYPREXA) 15 MG tablet Take 30 mg by mouth daily.  Marland Kitchen omeprazole (PRILOSEC) 20 MG capsule Take 20 mg by mouth daily.  Marland Kitchen PARoxetine (PAXIL-CR) 25 MG 24 hr tablet Take 25 mg by mouth daily.  . risperiDONE (RISPERDAL) 2 MG tablet Take 2 mg by mouth 2 (two) times daily.  . rivaroxaban (XARELTO) 20 MG TABS tablet Take 20 mg by mouth daily.  . simvastatin (ZOCOR) 20 MG tablet Take 20 mg by mouth at bedtime.  . triamcinolone cream (KENALOG) 0.5 % Apply 1 application topically 2 (two) times daily.  . vitamin B-12 (CYANOCOBALAMIN) 1000 MCG tablet Take 1,000 mcg by mouth daily.   No facility-administered encounter medications on file as of 12/12/2018.     PHYSICAL EXAM / ROS:   Current and past weights: 230 or 220 lbs. Will verify General: NAD, frail appearing, thinner in face Cardiovascular: no chest pain reported, no edema Pulmonary: no cough, no increased SOB, no DOE, room air Abdomen: appetite good, endorses constipation, incontinent of bowel GU: denies dysuria, incontinent of urine MSK:  no joint deformities, non ambulatory, pivot transfers, no falls, can wheel self in w/c Skin: no rashes or wounds reported, facial scratching per self Neurological: Weakness, poor sleep, denies pain  Jason Coop, NP  [facility]

## 2019-01-21 ENCOUNTER — Other Ambulatory Visit: Payer: Self-pay

## 2019-01-21 ENCOUNTER — Non-Acute Institutional Stay: Payer: Medicare Other | Admitting: Primary Care

## 2019-01-21 DIAGNOSIS — Z515 Encounter for palliative care: Secondary | ICD-10-CM

## 2019-01-21 NOTE — Progress Notes (Signed)
Therapist, nutritional Palliative Care Consult Note Telephone: [phone]  Fax: [phone]  TELEHEALTH VISIT STATEMENT Due to the COVID-19 crisis, this visit was done via telemedicine from my office. It was initiated and consented to by this patient and/or family.  PATIENT NAME: Anthony Chambers [address] [place] [address] [phone] (home)  DOB: [birth year] MRN: [id number]  PRIMARY CARE PROVIDER:   Dorothey Baseman, MD, [address] [place] [address] [phone]  REFERRING PROVIDER:  Dorothey Baseman, MD [address] [place],  [place] [address] [phone]  RESPONSIBLE PARTY:   Extended Emergency Contact Information Primary Emergency Contact: Eligah East Address: [address]          [place], [place] [address] Home Phone: [phone] Work Phone: [phone] Relation: Sister   ASSESSMENT AND RECOMMENDATIONS:   1. Advance Care Planning/Goals of Care: Goals include to maximize quality of life and symptom management. Sister is PR and has serious health issues. No advance care plans on file. T/c to POA to discuss. I continue to have difficulty getting in touch with her. No answer,  Message left.  2. Symptom Management:   Fatigue: States still has some but states it is improving.   Mood: States some discouragement but he does not stay in that fram of mind.  He states he enjoys collecting figures, has some stuffed characters from Peanuts comic strip. Staff states he is sleeping well.  Nutrition: endorses good appetite and is gaining non edema weight.  3. Family /Caregiver/Community Supports:  Sister is POA. Lives in LTC community.  4. Cognitive / Functional decline: Alert, oriented x 2. Endorses occasional sadness but then states he improves. Able to ambulate short distances with help.  5. Follow up Palliative Care Visit: Palliative care will continue to follow for goals of care clarification and symptom management. Return 6 weeks or prn.   I spent 35  minutes providing this consultation,  from 1500 to 1535. More than 50% of the time in this consultation was spent coordinating communication.   HISTORY OF PRESENT ILLNESS:  Anthony Chambers is a 74 y.o. year old male with multiple medical problems including respiratory failure, CHF, depression, schizophrenia. Palliative Care was asked to follow this patient by consultation request of Dorothey Baseman, MD to help address advance care planning and goals of care. This is a follow up visit.  CODE STATUS: FULL  PPS: 30% HOSPICE ELIGIBILITY/DIAGNOSIS: TBD  PAST MEDICAL HISTORY:  Past Medical History:  Diagnosis Date  . Depression   . GERD (gastroesophageal reflux disease)   . Hyperlipidemia   . Hypertension   . Schizophrenia (HCC)   . Total occlusion of coronary artery, chronic     SOCIAL HX:  Social History   Tobacco Use  . Smoking status: Former Smoker    Years: 40.00    Types: Cigarettes  . Smokeless tobacco: Never Used  Substance Use Topics  . Alcohol use: Not Currently    ALLERGIES:  Allergies  Allergen Reactions  . Heparin      PERTINENT MEDICATIONS:  Outpatient Encounter Medications as of 01/21/2019  Medication Sig  . acetaminophen (TYLENOL) 500 MG tablet Take 1,000 mg by mouth at bedtime.  Marland Kitchen albuterol (PROVENTIL) (2.5 MG/3ML) 0.083% nebulizer solution Take 3 mLs (2.5 mg total) by nebulization every 2 (two) hours as needed for wheezing or shortness of breath.  . budesonide (PULMICORT) 0.5 MG/2ML nebulizer solution Take 2 mLs (0.5 mg total) by nebulization 2 (two) times daily.  Marland Kitchen guaifenesin (ROBITUSSIN) 100 MG/5ML  syrup Take 10 mLs by mouth every 6 (six) hours as needed for cough.  Marland Kitchen LORazepam (ATIVAN) 0.5 MG tablet Take 1 tablet (0.5 mg total) by mouth every 8 (eight) hours as needed for anxiety.  . metoprolol succinate (TOPROL-XL) 25 MG 24 hr tablet Take 25 mg by mouth daily.  Marland Kitchen morphine 20 MG/5ML solution Take 1.3 mLs (5.2 mg total) by mouth every 2 (two) hours as needed  for pain.  Marland Kitchen OLANZapine (ZYPREXA) 15 MG tablet Take 30 mg by mouth daily.  Marland Kitchen omeprazole (PRILOSEC) 20 MG capsule Take 20 mg by mouth daily.  Marland Kitchen PARoxetine (PAXIL-CR) 25 MG 24 hr tablet Take 25 mg by mouth daily.  . risperiDONE (RISPERDAL) 2 MG tablet Take 2 mg by mouth 2 (two) times daily.  . rivaroxaban (XARELTO) 20 MG TABS tablet Take 20 mg by mouth daily.  . simvastatin (ZOCOR) 20 MG tablet Take 20 mg by mouth at bedtime.  . triamcinolone cream (KENALOG) 0.5 % Apply 1 application topically 2 (two) times daily.  . vitamin B-12 (CYANOCOBALAMIN) 1000 MCG tablet Take 1,000 mcg by mouth daily.   No facility-administered encounter medications on file as of 01/21/2019.    PHYSICAL EXAM / ROS:   Current and past weights: 236-239 lbs. Weight gain  General: NAD, frail appearing, obese Cardiovascular: no chest pain reported, no edema,  Pulmonary: no cough, no increased SOB, using oxygen at 2 L. Abdomen: appetite good, endorses occ constipation, continent of bowel GU: denies dysuria, continent of urine MSK:  no joint deformities, ambulatory with help.  Skin: no rashes or wounds reported, cellulitis R le but cleared, no falls Neurological: Weakness, denies pain, staff denies insomnia  Jason Coop, NP [facility]

## 2019-02-10 DIAGNOSIS — Z1159 Encounter for screening for other viral diseases: Secondary | ICD-10-CM | POA: Diagnosis not present

## 2019-02-17 DIAGNOSIS — J449 Chronic obstructive pulmonary disease, unspecified: Secondary | ICD-10-CM | POA: Diagnosis not present

## 2019-02-17 DIAGNOSIS — B351 Tinea unguium: Secondary | ICD-10-CM | POA: Diagnosis not present

## 2019-02-17 DIAGNOSIS — I739 Peripheral vascular disease, unspecified: Secondary | ICD-10-CM | POA: Diagnosis not present

## 2019-02-17 DIAGNOSIS — Z1159 Encounter for screening for other viral diseases: Secondary | ICD-10-CM | POA: Diagnosis not present

## 2019-02-17 DIAGNOSIS — I10 Essential (primary) hypertension: Secondary | ICD-10-CM | POA: Diagnosis not present

## 2019-02-17 DIAGNOSIS — J45901 Unspecified asthma with (acute) exacerbation: Secondary | ICD-10-CM | POA: Diagnosis not present

## 2019-02-17 DIAGNOSIS — R600 Localized edema: Secondary | ICD-10-CM | POA: Diagnosis not present

## 2019-02-25 DIAGNOSIS — I10 Essential (primary) hypertension: Secondary | ICD-10-CM | POA: Diagnosis not present

## 2019-02-25 DIAGNOSIS — E539 Vitamin B deficiency, unspecified: Secondary | ICD-10-CM | POA: Diagnosis not present

## 2019-02-25 DIAGNOSIS — D649 Anemia, unspecified: Secondary | ICD-10-CM | POA: Diagnosis not present

## 2019-02-25 DIAGNOSIS — E785 Hyperlipidemia, unspecified: Secondary | ICD-10-CM | POA: Diagnosis not present

## 2019-02-25 DIAGNOSIS — E119 Type 2 diabetes mellitus without complications: Secondary | ICD-10-CM | POA: Diagnosis not present

## 2019-03-25 ENCOUNTER — Other Ambulatory Visit: Payer: Self-pay

## 2019-03-25 ENCOUNTER — Non-Acute Institutional Stay: Payer: Medicare Other | Admitting: Primary Care

## 2019-03-25 DIAGNOSIS — Z515 Encounter for palliative care: Secondary | ICD-10-CM

## 2019-03-25 NOTE — Progress Notes (Signed)
[facility] Consult Note Telephone: [phone]  Fax: [phone]    PATIENT NAME: Anthony Chambers [address] [place] [address] [phone] (home)  DOB: 04/19/1945 MRN: [id number]  PRIMARY CARE PROVIDER:   Juluis Pitch, MD, [address] [place] [address] [phone]  REFERRING PROVIDER:  Juluis Pitch, MD [address] [place],  [place] [address] [phone]  RESPONSIBLE PARTY:   Extended Emergency Contact Information Primary Emergency Contact: Eddie Candle Address: [address]          [place], [place] [address] Home Phone: [phone] Work Phone: [phone] Relation: Sister   ASSESSMENT AND RECOMMENDATIONS:   1. Advance Care Planning/Goals of Care: Goals include to maximize quality of life and symptom management. Sister is POA but able to make some of his own  decisions.  2. Symptom Management:   I met with Anthony Chambers today. He was in his room awaiting lunch. He states he has no complaints currently. His he did state he does not like taking some of his medicines and some of his medicines he does like. He was not complaining about side effects just that he liked the idea of certain medicines over others. He said he did not particularly like his Zyprexa. States his sister and POA is ill she has not been able to visit. He has a CPAP which he says he wears sometimes but feels better when he does. I encouraged him to use it QHS with oxygen. He did not seem to have any distress or complaints.  3. Family /Caregiver/Community Supports: Lives in [place]  4. Cognitive / Functional decline: Alert, oriented x 1-2. Needs assistance with adls.   5. Follow up Palliative Care Visit: Palliative care will continue to follow for goals of care clarification and symptom management. Return 4-6 weeks or prn.  I spent 25 minutes providing this consultation,  from 1000 to 1025. More than 50% of the time in this consultation was spent coordinating  communication.   HISTORY OF PRESENT ILLNESS:  Anthony Chambers is a 74 y.o. year old male with multiple medical problems including h/o  respiratory failure, CHF, depression, schizophrenia. Palliative Care was asked to follow this patient by consultation request of Juluis Pitch, MD to help address advance care planning and goals of care. This is a follow up visit.  CODE STATUS: FULL  PPS: 30% HOSPICE ELIGIBILITY/DIAGNOSIS: TBD  PAST MEDICAL HISTORY:  Past Medical History:  Diagnosis Date  . Depression   . GERD (gastroesophageal reflux disease)   . Hyperlipidemia   . Hypertension   . Schizophrenia ([place])   . Total occlusion of coronary artery, chronic     SOCIAL HX:  Social History   Tobacco Use  . Smoking status: Former Smoker    Years: 40.00    Types: Cigarettes  . Smokeless tobacco: Never Used  Substance Use Topics  . Alcohol use: Not Currently    ALLERGIES:  Allergies  Allergen Reactions  . Heparin      PERTINENT MEDICATIONS:  Outpatient Encounter Medications as of 03/25/2019  Medication Sig  . acetaminophen (TYLENOL) 500 MG tablet Take 1,000 mg by mouth at bedtime.  Marland Kitchen albuterol (PROVENTIL) (2.5 MG/3ML) 0.083% nebulizer solution Take 3 mLs (2.5 mg total) by nebulization every 2 (two) hours as needed for wheezing or shortness of breath.  . guaifenesin (ROBITUSSIN) 100 MG/5ML syrup Take 10 mLs by mouth every 6 (six) hours as needed for cough.  . Melatonin 5 MG TABS Take by mouth.  Marland Kitchen  metoprolol succinate (TOPROL-XL) 25 MG 24 hr tablet Take 25 mg by mouth daily.  . Multiple Vitamin (MULTIVITAMIN WITH MINERALS) TABS tablet Take 1 tablet by mouth daily.  Marland Kitchen OLANZapine (ZYPREXA) 15 MG tablet Take 30 mg by mouth daily.  Marland Kitchen omeprazole (PRILOSEC) 20 MG capsule Take 20 mg by mouth daily.  Marland Kitchen PARoxetine (PAXIL-CR) 25 MG 24 hr tablet Take 25 mg by mouth daily.  . risperiDONE (RISPERDAL) 2 MG tablet Take 2 mg by mouth 2 (two) times daily.  . rivaroxaban (XARELTO) 20 MG TABS tablet  Take 20 mg by mouth daily.  Marland Kitchen saccharomyces boulardii (FLORASTOR) 250 MG capsule Take 250 mg by mouth 2 (two) times daily.  . sennosides-docusate sodium (SENOKOT-S) 8.6-50 MG tablet Take 1 tablet by mouth in the morning and at bedtime.  . simvastatin (ZOCOR) 20 MG tablet Take 20 mg by mouth at bedtime.  . triamcinolone cream (KENALOG) 0.5 % Apply 1 application topically 2 (two) times daily.  . [DISCONTINUED] budesonide (PULMICORT) 0.5 MG/2ML nebulizer solution Take 2 mLs (0.5 mg total) by nebulization 2 (two) times daily.  . [DISCONTINUED] LORazepam (ATIVAN) 0.5 MG tablet Take 1 tablet (0.5 mg total) by mouth every 8 (eight) hours as needed for anxiety.  . [DISCONTINUED] morphine 20 MG/5ML solution Take 1.3 mLs (5.2 mg total) by mouth every 2 (two) hours as needed for pain.  . [DISCONTINUED] vitamin B-12 (CYANOCOBALAMIN) 1000 MCG tablet Take 1,000 mcg by mouth daily.   No facility-administered encounter medications on file as of 03/25/2019.     PHYSICAL EXAM / ROS:   Current and past weights: 252 lbs, weight continues to increase. General: NAD, frail appearing, obese Cardiovascular: no chest pain reported, no edema in LE Pulmonary: no cough, no increased SOB. oxygen 4 L use ATC, has bipap Abdomen: appetite good, denies constipation, continent of bowel GU: denies dysuria, continent of urine MSK:  no joint deformities, non ambulatory, in chair Skin: no rashes or wounds reported Neurological: Weakness, denies pain, denies insomnia  Jason Coop, NP [facility]  COVID-19 PATIENT SCREENING TOOL  Person answering questions: ____________Staff_______ _____   1.  Is the patient or any family member in the home showing any signs or symptoms regarding respiratory infection?               Person with Symptom- __________NA_________________  a. Fever                                                                          Yes___ No___          ___________________  b. Shortness of breath                                                     Yes___ No___          ___________________ c. Cough/congestion                                       Yes___  No___  ___________________ d. Body aches/pains                                                         Yes___ No___        ____________________ e. Gastrointestinal symptoms (diarrhea, nausea)           Yes___ No___        ____________________  2. Within the past 14 days, has anyone living in the home had any contact with someone with or under investigation for COVID-19?    Yes___ No_X_   Person __________________

## 2019-03-27 ENCOUNTER — Ambulatory Visit (INDEPENDENT_AMBULATORY_CARE_PROVIDER_SITE_OTHER): Payer: Medicare Other | Admitting: Urology

## 2019-03-27 ENCOUNTER — Encounter: Payer: Self-pay | Admitting: Urology

## 2019-03-27 ENCOUNTER — Other Ambulatory Visit: Payer: Self-pay

## 2019-03-27 VITALS — BP 109/67 | HR 68

## 2019-03-27 DIAGNOSIS — R3981 Functional urinary incontinence: Secondary | ICD-10-CM | POA: Diagnosis not present

## 2019-03-27 DIAGNOSIS — R339 Retention of urine, unspecified: Secondary | ICD-10-CM | POA: Diagnosis not present

## 2019-03-27 DIAGNOSIS — R3912 Poor urinary stream: Secondary | ICD-10-CM | POA: Diagnosis not present

## 2019-03-27 LAB — BLADDER SCAN AMB NON-IMAGING

## 2019-03-27 MED ORDER — TAMSULOSIN HCL 0.4 MG PO CAPS: 0.4000 mg | ORAL_CAPSULE | Freq: Every day | ORAL | 3 refills | Status: AC

## 2019-03-27 NOTE — Progress Notes (Signed)
03/27/2019 1:00 PM   Anthony Chambers [birth year] [id number]  Referring provider: Juluis Pitch, MD [address] [place],  [place] [address]  No chief complaint on file.   HPI: Anthony Chambers was referred for LUTS. Referral for urinary retention. He describes incontinence. He is in a wheelchair. NG risk includes schizophrenia and antipsychotic use. He voids with a good flow. Bladder scan 266. He did not void prior or leave a specimen. PSA was 0.62 in 2015. He couldn't get up and stand for exam today. He drinks mainly tea and water. He c/o diarrhea and not constipation.   PMH: Past Medical History:  Diagnosis Date  . Depression   . GERD (gastroesophageal reflux disease)   . Hyperlipidemia   . Hypertension   . Schizophrenia ([place])   . Total occlusion of coronary artery, chronic     Surgical History: No past surgical history on file.  Home Medications:  Allergies as of 03/27/2019      Reactions   Heparin       Medication List       Accurate as of March 27, 2019  1:00 PM. If you have any questions, ask your nurse or doctor.        acetaminophen 500 MG tablet Commonly known as: TYLENOL Take 1,000 mg by mouth at bedtime.   albuterol (2.5 MG/3ML) 0.083% nebulizer solution Commonly known as: PROVENTIL Take 3 mLs (2.5 mg total) by nebulization every 2 (two) hours as needed for wheezing or shortness of breath.   guaifenesin 100 MG/5ML syrup Commonly known as: ROBITUSSIN Take 10 mLs by mouth every 6 (six) hours as needed for cough.   Melatonin 5 MG Tabs Take by mouth.   metoprolol succinate 25 MG 24 hr tablet Commonly known as: TOPROL-XL Take 25 mg by mouth daily.   multivitamin with minerals Tabs tablet Take 1 tablet by mouth daily.   OLANZapine 15 MG tablet Commonly known as: ZYPREXA Take 30 mg by mouth daily.   omeprazole 20 MG capsule Commonly known as: PRILOSEC Take 20 mg by mouth daily.   PARoxetine 25 MG 24 hr tablet Commonly known as: PAXIL-CR Take 25 mg by  mouth daily.   risperiDONE 2 MG tablet Commonly known as: RISPERDAL Take 2 mg by mouth 2 (two) times daily.   rivaroxaban 20 MG Tabs tablet Commonly known as: XARELTO Take 20 mg by mouth daily.   saccharomyces boulardii 250 MG capsule Commonly known as: FLORASTOR Take 250 mg by mouth 2 (two) times daily.   sennosides-docusate sodium 8.6-50 MG tablet Commonly known as: SENOKOT-S Take 1 tablet by mouth in the morning and at bedtime.   simvastatin 20 MG tablet Commonly known as: ZOCOR Take 20 mg by mouth at bedtime.   triamcinolone cream 0.5 % Commonly known as: KENALOG Apply 1 application topically 2 (two) times daily.       Allergies:  Allergies  Allergen Reactions  . Heparin     Family History: Family History  Problem Relation Age of Onset  . Asthma Mother     Social History:  reports that he has quit smoking. His smoking use included cigarettes. He quit after 40.00 years of use. He has never used smokeless tobacco. He reports previous alcohol use. He reports that he does not use drugs.   Physical Exam: There were no vitals taken for this visit.  Constitutional:  Alert and oriented, No acute distress. HEENT: [place] AT, moist mucus membranes.  Trachea midline, no masses. Cardiovascular: No clubbing, cyanosis, or  edema. Respiratory: Normal respiratory effort, no increased work of breathing. GI: Abdomen is soft, nontender, nondistended, no abdominal masses GU: No CVA tenderness Skin: No rashes, bruises or suspicious lesions. Neurologic: Grossly intact, no focal deficits, moving all 4 extremities. Psychiatric: Normal mood and affect.  Laboratory Data: Lab Results  Component Value Date   WBC 6.1 06/19/2018   HGB 13.6 06/19/2018   HCT 44.4 06/19/2018   MCV 84.3 06/19/2018   PLT 181 06/19/2018    Lab Results  Component Value Date   CREATININE 1.04 06/19/2018    No results found for: PSA  No results found for: TESTOSTERONE  No results found for:  HGBA1C  Urinalysis No results found for: COLORURINE, APPEARANCEUR, LABSPEC, PHURINE, GLUCOSEU, HGBUR, BILIRUBINUR, KETONESUR, PROTEINUR, UROBILINOGEN, NITRITE, LEUKOCYTESUR  No results found for: LABMICR, WBCUA, RBCUA, LABEPIT, MUCUS, BACTERIA  Pertinent Imaging: N/a  No results found for this or any previous visit. No results found for this or any previous visit. No results found for this or any previous visit. No results found for this or any previous visit. No results found for this or any previous visit. No results found for this or any previous visit. No results found for this or any previous visit. No results found for this or any previous visit.  Assessment & Plan:    Incontinence - based on history and PVR today, he does not appear to have retention. He likely has some functional incontinence and we discussed timed voiding "every two hours". Diapers at night are recommended. I will also start tamsulosin. Recheck symptoms and PVR in 3 mo.    No follow-ups on file.  Jerilee Field, MD  [facility] [address] [place], [place] [address] [phone]

## 2019-03-27 NOTE — Patient Instructions (Signed)

## 2019-04-16 DIAGNOSIS — J449 Chronic obstructive pulmonary disease, unspecified: Secondary | ICD-10-CM | POA: Diagnosis not present

## 2019-04-16 DIAGNOSIS — I110 Hypertensive heart disease with heart failure: Secondary | ICD-10-CM | POA: Diagnosis not present

## 2019-04-16 DIAGNOSIS — J45901 Unspecified asthma with (acute) exacerbation: Secondary | ICD-10-CM | POA: Diagnosis not present

## 2019-04-22 DIAGNOSIS — Z1159 Encounter for screening for other viral diseases: Secondary | ICD-10-CM | POA: Diagnosis not present

## 2019-04-28 DIAGNOSIS — Z1159 Encounter for screening for other viral diseases: Secondary | ICD-10-CM | POA: Diagnosis not present

## 2019-05-05 DIAGNOSIS — Z1159 Encounter for screening for other viral diseases: Secondary | ICD-10-CM | POA: Diagnosis not present

## 2019-05-12 DIAGNOSIS — J9611 Chronic respiratory failure with hypoxia: Secondary | ICD-10-CM | POA: Diagnosis not present

## 2019-05-18 ENCOUNTER — Other Ambulatory Visit: Payer: Self-pay

## 2019-05-18 ENCOUNTER — Non-Acute Institutional Stay: Payer: Medicare Other | Admitting: Primary Care

## 2019-05-18 DIAGNOSIS — Z515 Encounter for palliative care: Secondary | ICD-10-CM | POA: Diagnosis not present

## 2019-05-18 NOTE — Progress Notes (Signed)
[facility] Consult Note Telephone: [phone]  Fax: [phone]  PATIENT NAME: Anthony Chambers [address] [place] [address] [phone] (home)  DOB: October 17, 1945 MRN: [id number]  PRIMARY CARE PROVIDER:    Juluis Pitch, MD,  [address] [place] [address] [phone]  REFERRING PROVIDER:   Juluis Pitch, MD [address] [place],  [place] [address] [phone]  RESPONSIBLE PARTY:   Extended Emergency Contact Information Primary Emergency Contact: Eddie Candle Address: [address]          [place], [place] [address] Home Phone: [phone] Work Phone: [phone] Relation: Sister    I met with patient in the  facility.  ASSESSMENT AND RECOMMENDATIONS:   1. Advance Care Planning/Goals of Care: Goals include to maximize quality of life and symptom management. MOST on file with Full Code  2. Symptom Management:  I met with Anthony Chambers in his room. He'd been playing [employer] congregately which he enjoyed. He states the questions were too hard for him. He states he spilled some water all over his clothing and needs dry clothes. He has been seen by pulmonology recently and has been prescribed PT due to deconditioning. He has oxygen and a cpap machine for hs. Appetite is good and he is gaining weight.  3. Family /Caregiver/Community Supports: Sister is POA, patient is resident of LTC.  4. Cognitive / Functional decline: Alert, oriented x 2. Verbal and interactive.  5. Follow up Palliative Care Visit: Palliative care will continue to follow for goals of care clarification and symptom management. Return 12 weeks or prn.  I spent 25 minutes providing this consultation,  from 1430 to 1455. More than 50% of the time in this consultation was spent coordinating communication.   HISTORY OF PRESENT ILLNESS:  Anthony Chambers is a 74 y.o. year old male with multiple medical problems including COPD, weakness, weight gain. Palliative Care  was asked to follow this patient by consultation request of Juluis Pitch, MD to help address advance care planning and goals of care. This is a follow up visit.  CODE STATUS: FULL  PPS: 40% HOSPICE ELIGIBILITY/DIAGNOSIS: TBD  PAST MEDICAL HISTORY:  Past Medical History:  Diagnosis Date  . Depression   . GERD (gastroesophageal reflux disease)   . Hyperlipidemia   . Hypertension   . Schizophrenia ([place])   . Total occlusion of coronary artery, chronic     SOCIAL HX:  Social History   Tobacco Use  . Smoking status: Former Smoker    Years: 40.00    Types: Cigarettes  . Smokeless tobacco: Never Used  Substance Use Topics  . Alcohol use: Not Currently    ALLERGIES:  Allergies  Allergen Reactions  . Heparin      PERTINENT MEDICATIONS:  Outpatient Encounter Medications as of 05/18/2019  Medication Sig  . acetaminophen (TYLENOL) 500 MG tablet Take 1,000 mg by mouth at bedtime.  Marland Kitchen albuterol (ACCUNEB) 0.63 MG/3ML nebulizer solution Inhale into the lungs.  Marland Kitchen albuterol (PROVENTIL) (2.5 MG/3ML) 0.083% nebulizer solution Take 3 mLs (2.5 mg total) by nebulization every 2 (two) hours as needed for wheezing or shortness of breath.  Marland Kitchen albuterol (VENTOLIN HFA) 108 (90 Base) MCG/ACT inhaler   . dexamethasone (DECADRON) 6 MG tablet Take 6 mg by mouth once.  . furosemide (LASIX) 40 MG tablet Take 40 mg by mouth daily.  Marland Kitchen guaifenesin (ROBITUSSIN) 100 MG/5ML syrup Take 10 mLs by mouth every 6 (six) hours as needed for cough.  . Melatonin  5 MG TABS Take by mouth.  . metoprolol succinate (TOPROL-XL) 25 MG 24 hr tablet Take 25 mg by mouth daily.  . Multiple Vitamin (MULTIVITAMIN WITH MINERALS) TABS tablet Take 1 tablet by mouth daily.  Marland Kitchen OLANZapine (ZYPREXA) 15 MG tablet Take 30 mg by mouth daily.  Marland Kitchen omeprazole (PRILOSEC) 20 MG capsule Take 20 mg by mouth daily.  Marland Kitchen PARoxetine (PAXIL-CR) 25 MG 24 hr tablet Take 25 mg by mouth daily.  . risperiDONE (RISPERDAL) 2 MG tablet Take 2 mg by mouth 2  (two) times daily.  . rivaroxaban (XARELTO) 20 MG TABS tablet Take 20 mg by mouth daily.  Marland Kitchen saccharomyces boulardii (FLORASTOR) 250 MG capsule Take 250 mg by mouth 2 (two) times daily.  . sennosides-docusate sodium (SENOKOT-S) 8.6-50 MG tablet Take 1 tablet by mouth in the morning and at bedtime.  . simvastatin (ZOCOR) 20 MG tablet Take 20 mg by mouth at bedtime.  . tamsulosin (FLOMAX) 0.4 MG CAPS capsule Take 1 capsule (0.4 mg total) by mouth daily after supper.  . triamcinolone cream (KENALOG) 0.5 % Apply 1 application topically 2 (two) times daily.   No facility-administered encounter medications on file as of 05/18/2019.    PHYSICAL EXAM / ROS:   Current and past weights: gained weight, up 20lbs in several months General: NAD, frail appearing, obese Cardiovascular: no chest pain reported, no edema  Pulmonary: no cough, no increased SOB, oxygen at 2 L, cpap Abdomen: appetite good, denies constipation, continent of bowel GU: denies dysuria, continent of urine MSK:  no joint and ROM abnormalities, non ambulatory, uses wc .Needs assist with transfers Skin: no rashes or wounds reported Neurological: Weakness, alert, conversant, able to make needs known.Denies pain  Jason Coop, NP [facility]  COVID-19 PATIENT SCREENING TOOL  Person answering questions: ____________Staff_______ _____   1.  Is the patient or any family member in the home showing any signs or symptoms regarding respiratory infection?               Person with Symptom- __________NA_________________  a. Fever                                                                          Yes___ No___          ___________________  b. Shortness of breath                                                    Yes___ No___          ___________________ c. Cough/congestion                                       Yes___  No___         ___________________ d. Body aches/pains  Yes___ No___        ____________________ e. Gastrointestinal symptoms (diarrhea, nausea)           Yes___ No___        ____________________  2. Within the past 14 days, has anyone living in the home had any contact with someone with or under investigation for COVID-19?    Yes___ No_X_   Person __________________

## 2019-05-27 DIAGNOSIS — J441 Chronic obstructive pulmonary disease with (acute) exacerbation: Secondary | ICD-10-CM | POA: Diagnosis not present

## 2019-05-27 DIAGNOSIS — I739 Peripheral vascular disease, unspecified: Secondary | ICD-10-CM | POA: Diagnosis not present

## 2019-05-27 DIAGNOSIS — I509 Heart failure, unspecified: Secondary | ICD-10-CM | POA: Diagnosis not present

## 2019-05-27 DIAGNOSIS — I10 Essential (primary) hypertension: Secondary | ICD-10-CM | POA: Diagnosis not present

## 2019-05-27 DIAGNOSIS — I2582 Chronic total occlusion of coronary artery: Secondary | ICD-10-CM | POA: Diagnosis not present

## 2019-05-27 DIAGNOSIS — R262 Difficulty in walking, not elsewhere classified: Secondary | ICD-10-CM | POA: Diagnosis not present

## 2019-05-27 DIAGNOSIS — E785 Hyperlipidemia, unspecified: Secondary | ICD-10-CM | POA: Diagnosis not present

## 2019-05-27 DIAGNOSIS — E539 Vitamin B deficiency, unspecified: Secondary | ICD-10-CM | POA: Diagnosis not present

## 2019-05-27 DIAGNOSIS — R5381 Other malaise: Secondary | ICD-10-CM | POA: Diagnosis not present

## 2019-05-27 DIAGNOSIS — R52 Pain, unspecified: Secondary | ICD-10-CM | POA: Diagnosis not present

## 2019-05-27 DIAGNOSIS — K219 Gastro-esophageal reflux disease without esophagitis: Secondary | ICD-10-CM | POA: Diagnosis not present

## 2019-05-28 DIAGNOSIS — K219 Gastro-esophageal reflux disease without esophagitis: Secondary | ICD-10-CM | POA: Diagnosis not present

## 2019-05-28 DIAGNOSIS — I2582 Chronic total occlusion of coronary artery: Secondary | ICD-10-CM | POA: Diagnosis not present

## 2019-05-28 DIAGNOSIS — I509 Heart failure, unspecified: Secondary | ICD-10-CM | POA: Diagnosis not present

## 2019-05-28 DIAGNOSIS — I10 Essential (primary) hypertension: Secondary | ICD-10-CM | POA: Diagnosis not present

## 2019-05-28 DIAGNOSIS — R5381 Other malaise: Secondary | ICD-10-CM | POA: Diagnosis not present

## 2019-05-28 DIAGNOSIS — J441 Chronic obstructive pulmonary disease with (acute) exacerbation: Secondary | ICD-10-CM | POA: Diagnosis not present

## 2019-05-28 DIAGNOSIS — R262 Difficulty in walking, not elsewhere classified: Secondary | ICD-10-CM | POA: Diagnosis not present

## 2019-05-28 DIAGNOSIS — E539 Vitamin B deficiency, unspecified: Secondary | ICD-10-CM | POA: Diagnosis not present

## 2019-05-28 DIAGNOSIS — E785 Hyperlipidemia, unspecified: Secondary | ICD-10-CM | POA: Diagnosis not present

## 2019-05-28 DIAGNOSIS — I739 Peripheral vascular disease, unspecified: Secondary | ICD-10-CM | POA: Diagnosis not present

## 2019-05-28 DIAGNOSIS — R52 Pain, unspecified: Secondary | ICD-10-CM | POA: Diagnosis not present

## 2019-05-29 DIAGNOSIS — I10 Essential (primary) hypertension: Secondary | ICD-10-CM | POA: Diagnosis not present

## 2019-05-29 DIAGNOSIS — R5381 Other malaise: Secondary | ICD-10-CM | POA: Diagnosis not present

## 2019-05-29 DIAGNOSIS — E785 Hyperlipidemia, unspecified: Secondary | ICD-10-CM | POA: Diagnosis not present

## 2019-05-29 DIAGNOSIS — R262 Difficulty in walking, not elsewhere classified: Secondary | ICD-10-CM | POA: Diagnosis not present

## 2019-05-29 DIAGNOSIS — I739 Peripheral vascular disease, unspecified: Secondary | ICD-10-CM | POA: Diagnosis not present

## 2019-05-29 DIAGNOSIS — I509 Heart failure, unspecified: Secondary | ICD-10-CM | POA: Diagnosis not present

## 2019-05-29 DIAGNOSIS — R52 Pain, unspecified: Secondary | ICD-10-CM | POA: Diagnosis not present

## 2019-05-29 DIAGNOSIS — I2582 Chronic total occlusion of coronary artery: Secondary | ICD-10-CM | POA: Diagnosis not present

## 2019-05-29 DIAGNOSIS — K219 Gastro-esophageal reflux disease without esophagitis: Secondary | ICD-10-CM | POA: Diagnosis not present

## 2019-05-29 DIAGNOSIS — E539 Vitamin B deficiency, unspecified: Secondary | ICD-10-CM | POA: Diagnosis not present

## 2019-05-29 DIAGNOSIS — J441 Chronic obstructive pulmonary disease with (acute) exacerbation: Secondary | ICD-10-CM | POA: Diagnosis not present

## 2019-06-01 DIAGNOSIS — I739 Peripheral vascular disease, unspecified: Secondary | ICD-10-CM | POA: Diagnosis not present

## 2019-06-01 DIAGNOSIS — R262 Difficulty in walking, not elsewhere classified: Secondary | ICD-10-CM | POA: Diagnosis not present

## 2019-06-01 DIAGNOSIS — R5381 Other malaise: Secondary | ICD-10-CM | POA: Diagnosis not present

## 2019-06-01 DIAGNOSIS — I2582 Chronic total occlusion of coronary artery: Secondary | ICD-10-CM | POA: Diagnosis not present

## 2019-06-01 DIAGNOSIS — I10 Essential (primary) hypertension: Secondary | ICD-10-CM | POA: Diagnosis not present

## 2019-06-01 DIAGNOSIS — R52 Pain, unspecified: Secondary | ICD-10-CM | POA: Diagnosis not present

## 2019-06-01 DIAGNOSIS — E785 Hyperlipidemia, unspecified: Secondary | ICD-10-CM | POA: Diagnosis not present

## 2019-06-01 DIAGNOSIS — E539 Vitamin B deficiency, unspecified: Secondary | ICD-10-CM | POA: Diagnosis not present

## 2019-06-01 DIAGNOSIS — J441 Chronic obstructive pulmonary disease with (acute) exacerbation: Secondary | ICD-10-CM | POA: Diagnosis not present

## 2019-06-01 DIAGNOSIS — I509 Heart failure, unspecified: Secondary | ICD-10-CM | POA: Diagnosis not present

## 2019-06-01 DIAGNOSIS — K219 Gastro-esophageal reflux disease without esophagitis: Secondary | ICD-10-CM | POA: Diagnosis not present

## 2019-06-02 DIAGNOSIS — E785 Hyperlipidemia, unspecified: Secondary | ICD-10-CM | POA: Diagnosis not present

## 2019-06-02 DIAGNOSIS — I2582 Chronic total occlusion of coronary artery: Secondary | ICD-10-CM | POA: Diagnosis not present

## 2019-06-02 DIAGNOSIS — R262 Difficulty in walking, not elsewhere classified: Secondary | ICD-10-CM | POA: Diagnosis not present

## 2019-06-02 DIAGNOSIS — J441 Chronic obstructive pulmonary disease with (acute) exacerbation: Secondary | ICD-10-CM | POA: Diagnosis not present

## 2019-06-02 DIAGNOSIS — I10 Essential (primary) hypertension: Secondary | ICD-10-CM | POA: Diagnosis not present

## 2019-06-02 DIAGNOSIS — R52 Pain, unspecified: Secondary | ICD-10-CM | POA: Diagnosis not present

## 2019-06-02 DIAGNOSIS — I739 Peripheral vascular disease, unspecified: Secondary | ICD-10-CM | POA: Diagnosis not present

## 2019-06-02 DIAGNOSIS — E539 Vitamin B deficiency, unspecified: Secondary | ICD-10-CM | POA: Diagnosis not present

## 2019-06-02 DIAGNOSIS — R5381 Other malaise: Secondary | ICD-10-CM | POA: Diagnosis not present

## 2019-06-02 DIAGNOSIS — I509 Heart failure, unspecified: Secondary | ICD-10-CM | POA: Diagnosis not present

## 2019-06-02 DIAGNOSIS — K219 Gastro-esophageal reflux disease without esophagitis: Secondary | ICD-10-CM | POA: Diagnosis not present

## 2019-06-03 DIAGNOSIS — R262 Difficulty in walking, not elsewhere classified: Secondary | ICD-10-CM | POA: Diagnosis not present

## 2019-06-03 DIAGNOSIS — I509 Heart failure, unspecified: Secondary | ICD-10-CM | POA: Diagnosis not present

## 2019-06-03 DIAGNOSIS — I739 Peripheral vascular disease, unspecified: Secondary | ICD-10-CM | POA: Diagnosis not present

## 2019-06-03 DIAGNOSIS — K219 Gastro-esophageal reflux disease without esophagitis: Secondary | ICD-10-CM | POA: Diagnosis not present

## 2019-06-03 DIAGNOSIS — E785 Hyperlipidemia, unspecified: Secondary | ICD-10-CM | POA: Diagnosis not present

## 2019-06-03 DIAGNOSIS — R5381 Other malaise: Secondary | ICD-10-CM | POA: Diagnosis not present

## 2019-06-03 DIAGNOSIS — J441 Chronic obstructive pulmonary disease with (acute) exacerbation: Secondary | ICD-10-CM | POA: Diagnosis not present

## 2019-06-03 DIAGNOSIS — R52 Pain, unspecified: Secondary | ICD-10-CM | POA: Diagnosis not present

## 2019-06-03 DIAGNOSIS — I10 Essential (primary) hypertension: Secondary | ICD-10-CM | POA: Diagnosis not present

## 2019-06-03 DIAGNOSIS — E539 Vitamin B deficiency, unspecified: Secondary | ICD-10-CM | POA: Diagnosis not present

## 2019-06-03 DIAGNOSIS — I2582 Chronic total occlusion of coronary artery: Secondary | ICD-10-CM | POA: Diagnosis not present

## 2019-06-04 DIAGNOSIS — K219 Gastro-esophageal reflux disease without esophagitis: Secondary | ICD-10-CM | POA: Diagnosis not present

## 2019-06-04 DIAGNOSIS — I10 Essential (primary) hypertension: Secondary | ICD-10-CM | POA: Diagnosis not present

## 2019-06-04 DIAGNOSIS — E539 Vitamin B deficiency, unspecified: Secondary | ICD-10-CM | POA: Diagnosis not present

## 2019-06-04 DIAGNOSIS — I509 Heart failure, unspecified: Secondary | ICD-10-CM | POA: Diagnosis not present

## 2019-06-04 DIAGNOSIS — R5381 Other malaise: Secondary | ICD-10-CM | POA: Diagnosis not present

## 2019-06-04 DIAGNOSIS — I2582 Chronic total occlusion of coronary artery: Secondary | ICD-10-CM | POA: Diagnosis not present

## 2019-06-04 DIAGNOSIS — I739 Peripheral vascular disease, unspecified: Secondary | ICD-10-CM | POA: Diagnosis not present

## 2019-06-04 DIAGNOSIS — R262 Difficulty in walking, not elsewhere classified: Secondary | ICD-10-CM | POA: Diagnosis not present

## 2019-06-04 DIAGNOSIS — R52 Pain, unspecified: Secondary | ICD-10-CM | POA: Diagnosis not present

## 2019-06-04 DIAGNOSIS — J441 Chronic obstructive pulmonary disease with (acute) exacerbation: Secondary | ICD-10-CM | POA: Diagnosis not present

## 2019-06-04 DIAGNOSIS — E785 Hyperlipidemia, unspecified: Secondary | ICD-10-CM | POA: Diagnosis not present

## 2019-06-05 DIAGNOSIS — E785 Hyperlipidemia, unspecified: Secondary | ICD-10-CM | POA: Diagnosis not present

## 2019-06-05 DIAGNOSIS — I10 Essential (primary) hypertension: Secondary | ICD-10-CM | POA: Diagnosis not present

## 2019-06-05 DIAGNOSIS — I739 Peripheral vascular disease, unspecified: Secondary | ICD-10-CM | POA: Diagnosis not present

## 2019-06-05 DIAGNOSIS — R5381 Other malaise: Secondary | ICD-10-CM | POA: Diagnosis not present

## 2019-06-05 DIAGNOSIS — J441 Chronic obstructive pulmonary disease with (acute) exacerbation: Secondary | ICD-10-CM | POA: Diagnosis not present

## 2019-06-05 DIAGNOSIS — I2582 Chronic total occlusion of coronary artery: Secondary | ICD-10-CM | POA: Diagnosis not present

## 2019-06-05 DIAGNOSIS — R52 Pain, unspecified: Secondary | ICD-10-CM | POA: Diagnosis not present

## 2019-06-05 DIAGNOSIS — R262 Difficulty in walking, not elsewhere classified: Secondary | ICD-10-CM | POA: Diagnosis not present

## 2019-06-05 DIAGNOSIS — I509 Heart failure, unspecified: Secondary | ICD-10-CM | POA: Diagnosis not present

## 2019-06-05 DIAGNOSIS — E539 Vitamin B deficiency, unspecified: Secondary | ICD-10-CM | POA: Diagnosis not present

## 2019-06-05 DIAGNOSIS — K219 Gastro-esophageal reflux disease without esophagitis: Secondary | ICD-10-CM | POA: Diagnosis not present

## 2019-06-08 DIAGNOSIS — R5381 Other malaise: Secondary | ICD-10-CM | POA: Diagnosis not present

## 2019-06-08 DIAGNOSIS — E539 Vitamin B deficiency, unspecified: Secondary | ICD-10-CM | POA: Diagnosis not present

## 2019-06-08 DIAGNOSIS — I509 Heart failure, unspecified: Secondary | ICD-10-CM | POA: Diagnosis not present

## 2019-06-08 DIAGNOSIS — I739 Peripheral vascular disease, unspecified: Secondary | ICD-10-CM | POA: Diagnosis not present

## 2019-06-08 DIAGNOSIS — I10 Essential (primary) hypertension: Secondary | ICD-10-CM | POA: Diagnosis not present

## 2019-06-08 DIAGNOSIS — R52 Pain, unspecified: Secondary | ICD-10-CM | POA: Diagnosis not present

## 2019-06-08 DIAGNOSIS — R262 Difficulty in walking, not elsewhere classified: Secondary | ICD-10-CM | POA: Diagnosis not present

## 2019-06-08 DIAGNOSIS — K219 Gastro-esophageal reflux disease without esophagitis: Secondary | ICD-10-CM | POA: Diagnosis not present

## 2019-06-08 DIAGNOSIS — E785 Hyperlipidemia, unspecified: Secondary | ICD-10-CM | POA: Diagnosis not present

## 2019-06-08 DIAGNOSIS — J441 Chronic obstructive pulmonary disease with (acute) exacerbation: Secondary | ICD-10-CM | POA: Diagnosis not present

## 2019-06-08 DIAGNOSIS — I2582 Chronic total occlusion of coronary artery: Secondary | ICD-10-CM | POA: Diagnosis not present

## 2019-06-09 DIAGNOSIS — I10 Essential (primary) hypertension: Secondary | ICD-10-CM | POA: Diagnosis not present

## 2019-06-09 DIAGNOSIS — I739 Peripheral vascular disease, unspecified: Secondary | ICD-10-CM | POA: Diagnosis not present

## 2019-06-09 DIAGNOSIS — E785 Hyperlipidemia, unspecified: Secondary | ICD-10-CM | POA: Diagnosis not present

## 2019-06-09 DIAGNOSIS — I2582 Chronic total occlusion of coronary artery: Secondary | ICD-10-CM | POA: Diagnosis not present

## 2019-06-09 DIAGNOSIS — I509 Heart failure, unspecified: Secondary | ICD-10-CM | POA: Diagnosis not present

## 2019-06-09 DIAGNOSIS — R262 Difficulty in walking, not elsewhere classified: Secondary | ICD-10-CM | POA: Diagnosis not present

## 2019-06-09 DIAGNOSIS — K219 Gastro-esophageal reflux disease without esophagitis: Secondary | ICD-10-CM | POA: Diagnosis not present

## 2019-06-09 DIAGNOSIS — J441 Chronic obstructive pulmonary disease with (acute) exacerbation: Secondary | ICD-10-CM | POA: Diagnosis not present

## 2019-06-09 DIAGNOSIS — R52 Pain, unspecified: Secondary | ICD-10-CM | POA: Diagnosis not present

## 2019-06-09 DIAGNOSIS — E539 Vitamin B deficiency, unspecified: Secondary | ICD-10-CM | POA: Diagnosis not present

## 2019-06-10 DIAGNOSIS — E539 Vitamin B deficiency, unspecified: Secondary | ICD-10-CM | POA: Diagnosis not present

## 2019-06-10 DIAGNOSIS — K219 Gastro-esophageal reflux disease without esophagitis: Secondary | ICD-10-CM | POA: Diagnosis not present

## 2019-06-10 DIAGNOSIS — I10 Essential (primary) hypertension: Secondary | ICD-10-CM | POA: Diagnosis not present

## 2019-06-10 DIAGNOSIS — I509 Heart failure, unspecified: Secondary | ICD-10-CM | POA: Diagnosis not present

## 2019-06-10 DIAGNOSIS — J441 Chronic obstructive pulmonary disease with (acute) exacerbation: Secondary | ICD-10-CM | POA: Diagnosis not present

## 2019-06-10 DIAGNOSIS — R262 Difficulty in walking, not elsewhere classified: Secondary | ICD-10-CM | POA: Diagnosis not present

## 2019-06-10 DIAGNOSIS — E785 Hyperlipidemia, unspecified: Secondary | ICD-10-CM | POA: Diagnosis not present

## 2019-06-10 DIAGNOSIS — I739 Peripheral vascular disease, unspecified: Secondary | ICD-10-CM | POA: Diagnosis not present

## 2019-06-10 DIAGNOSIS — R52 Pain, unspecified: Secondary | ICD-10-CM | POA: Diagnosis not present

## 2019-06-10 DIAGNOSIS — I2582 Chronic total occlusion of coronary artery: Secondary | ICD-10-CM | POA: Diagnosis not present

## 2019-06-11 DIAGNOSIS — I509 Heart failure, unspecified: Secondary | ICD-10-CM | POA: Diagnosis not present

## 2019-06-11 DIAGNOSIS — I2582 Chronic total occlusion of coronary artery: Secondary | ICD-10-CM | POA: Diagnosis not present

## 2019-06-11 DIAGNOSIS — R52 Pain, unspecified: Secondary | ICD-10-CM | POA: Diagnosis not present

## 2019-06-11 DIAGNOSIS — J441 Chronic obstructive pulmonary disease with (acute) exacerbation: Secondary | ICD-10-CM | POA: Diagnosis not present

## 2019-06-11 DIAGNOSIS — K219 Gastro-esophageal reflux disease without esophagitis: Secondary | ICD-10-CM | POA: Diagnosis not present

## 2019-06-11 DIAGNOSIS — E785 Hyperlipidemia, unspecified: Secondary | ICD-10-CM | POA: Diagnosis not present

## 2019-06-11 DIAGNOSIS — I739 Peripheral vascular disease, unspecified: Secondary | ICD-10-CM | POA: Diagnosis not present

## 2019-06-11 DIAGNOSIS — I10 Essential (primary) hypertension: Secondary | ICD-10-CM | POA: Diagnosis not present

## 2019-06-11 DIAGNOSIS — R262 Difficulty in walking, not elsewhere classified: Secondary | ICD-10-CM | POA: Diagnosis not present

## 2019-06-11 DIAGNOSIS — E539 Vitamin B deficiency, unspecified: Secondary | ICD-10-CM | POA: Diagnosis not present

## 2019-06-12 DIAGNOSIS — J441 Chronic obstructive pulmonary disease with (acute) exacerbation: Secondary | ICD-10-CM | POA: Diagnosis not present

## 2019-06-12 DIAGNOSIS — I10 Essential (primary) hypertension: Secondary | ICD-10-CM | POA: Diagnosis not present

## 2019-06-12 DIAGNOSIS — K219 Gastro-esophageal reflux disease without esophagitis: Secondary | ICD-10-CM | POA: Diagnosis not present

## 2019-06-12 DIAGNOSIS — I739 Peripheral vascular disease, unspecified: Secondary | ICD-10-CM | POA: Diagnosis not present

## 2019-06-12 DIAGNOSIS — I509 Heart failure, unspecified: Secondary | ICD-10-CM | POA: Diagnosis not present

## 2019-06-12 DIAGNOSIS — E785 Hyperlipidemia, unspecified: Secondary | ICD-10-CM | POA: Diagnosis not present

## 2019-06-12 DIAGNOSIS — E539 Vitamin B deficiency, unspecified: Secondary | ICD-10-CM | POA: Diagnosis not present

## 2019-06-12 DIAGNOSIS — R52 Pain, unspecified: Secondary | ICD-10-CM | POA: Diagnosis not present

## 2019-06-12 DIAGNOSIS — R262 Difficulty in walking, not elsewhere classified: Secondary | ICD-10-CM | POA: Diagnosis not present

## 2019-06-12 DIAGNOSIS — I2582 Chronic total occlusion of coronary artery: Secondary | ICD-10-CM | POA: Diagnosis not present

## 2019-06-18 DIAGNOSIS — J449 Chronic obstructive pulmonary disease, unspecified: Secondary | ICD-10-CM | POA: Diagnosis not present

## 2019-06-18 DIAGNOSIS — I10 Essential (primary) hypertension: Secondary | ICD-10-CM | POA: Diagnosis not present

## 2019-06-18 DIAGNOSIS — J45901 Unspecified asthma with (acute) exacerbation: Secondary | ICD-10-CM | POA: Diagnosis not present

## 2019-07-03 ENCOUNTER — Encounter: Payer: Self-pay | Admitting: Urology

## 2019-07-03 ENCOUNTER — Other Ambulatory Visit: Payer: Self-pay

## 2019-07-03 ENCOUNTER — Ambulatory Visit (INDEPENDENT_AMBULATORY_CARE_PROVIDER_SITE_OTHER): Payer: Medicare Other | Admitting: Urology

## 2019-07-03 VITALS — BP 145/79 | HR 57 | Ht 73.0 in | Wt 238.0 lb

## 2019-07-03 DIAGNOSIS — R3981 Functional urinary incontinence: Secondary | ICD-10-CM | POA: Diagnosis not present

## 2019-07-03 DIAGNOSIS — R339 Retention of urine, unspecified: Secondary | ICD-10-CM

## 2019-07-03 LAB — BLADDER SCAN AMB NON-IMAGING: Scan Result: 70

## 2019-07-03 NOTE — Progress Notes (Signed)
07/03/2019 1:18 PM   Anthony Chambers 1945/04/16 [id number]  Referring provider: Juluis Pitch, MD [address] [place],  [place] [address]  Chief Complaint  Patient presents with   Follow-up    HPI:  F/u - LUTS, urinary retention. He describes incontinence. He is in a wheelchair. NG risk includes schizophrenia and antipsychotic use. He voids with a good flow. Bladder scan 266. He did not void prior or leave a specimen. PSA was 0.62 in 2015. He couldn't get up and stand for exam today. He drinks mainly tea and water. He c/o diarrhea and not constipation.   PVR today 70 ml. Incontinence episodes may be better and flow better on tamsulosin.    PMH: Past Medical History:  Diagnosis Date   Depression    GERD (gastroesophageal reflux disease)    Hyperlipidemia    Hypertension    Schizophrenia ([place])    Total occlusion of coronary artery, chronic     Surgical History: History reviewed. No pertinent surgical history.  Home Medications:  Allergies as of 07/03/2019      Reactions   Heparin       Medication List       Accurate as of July 03, 2019  1:18 PM. If you have any questions, ask your nurse or doctor.        acetaminophen 500 MG tablet Commonly known as: TYLENOL Take 1,000 mg by mouth at bedtime.   albuterol (2.5 MG/3ML) 0.083% nebulizer solution Commonly known as: PROVENTIL Take 3 mLs (2.5 mg total) by nebulization every 2 (two) hours as needed for wheezing or shortness of breath.   albuterol 0.63 MG/3ML nebulizer solution Commonly known as: ACCUNEB Inhale into the lungs.   albuterol 108 (90 Base) MCG/ACT inhaler Commonly known as: VENTOLIN HFA   dexamethasone 6 MG tablet Commonly known as: DECADRON Take 6 mg by mouth once.   furosemide 40 MG tablet Commonly known as: LASIX Take 40 mg by mouth daily.   guaifenesin 100 MG/5ML syrup Commonly known as: ROBITUSSIN Take 10 mLs by mouth every 6 (six) hours as needed for cough.   melatonin 5 MG  Tabs Take by mouth.   metoprolol succinate 25 MG 24 hr tablet Commonly known as: TOPROL-XL Take 25 mg by mouth daily.   multivitamin with minerals Tabs tablet Take 1 tablet by mouth daily.   OLANZapine 15 MG tablet Commonly known as: ZYPREXA Take 30 mg by mouth daily.   omeprazole 20 MG capsule Commonly known as: PRILOSEC Take 20 mg by mouth daily.   PARoxetine 25 MG 24 hr tablet Commonly known as: PAXIL-CR Take 25 mg by mouth daily.   Pulmicort Flexhaler 90 MCG/ACT inhaler Generic drug: Budesonide Inhale into the lungs.   risperiDONE 2 MG tablet Commonly known as: RISPERDAL Take 2 mg by mouth 2 (two) times daily.   rivaroxaban 20 MG Tabs tablet Commonly known as: XARELTO Take 20 mg by mouth daily.   saccharomyces boulardii 250 MG capsule Commonly known as: FLORASTOR Take 250 mg by mouth 2 (two) times daily.   sennosides-docusate sodium 8.6-50 MG tablet Commonly known as: SENOKOT-S Take 1 tablet by mouth in the morning and at bedtime.   simvastatin 20 MG tablet Commonly known as: ZOCOR Take 20 mg by mouth at bedtime.   tamsulosin 0.4 MG Caps capsule Commonly known as: FLOMAX Take 1 capsule (0.4 mg total) by mouth daily after supper.   triamcinolone cream 0.5 % Commonly known as: KENALOG Apply 1 application topically 2 (two) times  daily.       Allergies:  Allergies  Allergen Reactions   Heparin     Family History: Family History  Problem Relation Age of Onset   Asthma Mother     Social History:  reports that he has quit smoking. His smoking use included cigarettes. He quit after 40.00 years of use. He has never used smokeless tobacco. He reports previous alcohol use. He reports that he does not use drugs.   Physical Exam: BP (!) 145/79    Pulse (!) 57    Ht 6\' 1"  (1.854 m)    Wt 238 lb (108 kg)    BMI 31.40 kg/m   Constitutional:  Alert and oriented, No acute distress. HEENT: [place] AT, moist mucus membranes.  Trachea midline, no  masses. Cardiovascular: No clubbing, cyanosis, or edema. Respiratory: Normal respiratory effort, no increased work of breathing. GI: Abdomen is soft, nontender, nondistended, no abdominal masses GU: No CVA tenderness Skin: No rashes, bruises or suspicious lesions. Neurologic: Grossly intact, no focal deficits, moving all 4 extremities. Psychiatric: Normal mood and affect.  Laboratory Data: Lab Results  Component Value Date   WBC 6.1 06/19/2018   HGB 13.6 06/19/2018   HCT 44.4 06/19/2018   MCV 84.3 06/19/2018   PLT 181 06/19/2018    Lab Results  Component Value Date   CREATININE 1.04 06/19/2018    No results found for: PSA  No results found for: TESTOSTERONE  No results found for: HGBA1C  Urinalysis No results found for: COLORURINE, APPEARANCEUR, LABSPEC, PHURINE, GLUCOSEU, HGBUR, BILIRUBINUR, KETONESUR, PROTEINUR, UROBILINOGEN, NITRITE, LEUKOCYTESUR  No results found for: LABMICR, WBCUA, RBCUA, LABEPIT, MUCUS, BACTERIA  Pertinent Imaging: n/a No results found for this or any previous visit.  No results found for this or any previous visit.  No results found for this or any previous visit.  No results found for this or any previous visit.  No results found for this or any previous visit.  No results found for this or any previous visit.  No results found for this or any previous visit.  No results found for this or any previous visit.   Assessment & Plan:    1. BPH, inc emptying, incontinence  - doing well on tamsulosin and timed voiding. Continue.  - Bladder Scan (Post Void Residual) in office   No follow-ups on file.  08/19/2018, MD  [facility] [address], [address] [place], [place] [phone]

## 2019-07-15 ENCOUNTER — Non-Acute Institutional Stay: Payer: Medicare Other | Admitting: Primary Care

## 2019-07-15 ENCOUNTER — Other Ambulatory Visit: Payer: Self-pay

## 2019-07-15 DIAGNOSIS — Z515 Encounter for palliative care: Secondary | ICD-10-CM

## 2019-07-15 NOTE — Progress Notes (Addendum)
[place] Consult Note Telephone: [phone]  Fax: [phone]  PATIENT NAME: Anthony Chambers [address] [place] [address] [phone] (home)  DOB: 09-Jan-1946 MRN: [id number]  PRIMARY CARE PROVIDER:    Juluis Pitch, MD,  [address] [place] [address] [phone]  REFERRING PROVIDER:   Juluis Pitch, MD [address] [place],  [place] [address] [phone]  RESPONSIBLE PARTY:   Extended Emergency Contact Information Primary Emergency Contact: Eddie Candle Address: [address]          [place], [place] [address] Home Phone: [phone] Work Phone: [phone] Relation: Sister  I met with patient in the facility.  ASSESSMENT AND RECOMMENDATIONS:   1. Advance Care Planning/Goals of Care: Goals include to maximize quality of life and symptom management.  MOST on file from 2020, have not been able to reach POA for review for several months. Current directives include full code, full scope of care, limited use of abx, iv and feeding tube.   2. Symptom Management:   Dyspnea: Not using CPAP, recommend RT consult to fit properly. Staff is recording patient refusing but issue may be fit.  Oxygen levels running 93% with oxygen. Needs a mask for going out of the room, staff to provide. No S/sx infection. However moving air poorly per lung auscultation. Discussed nebulizers, which had been on hold for covid. I recommend a spacer for albuterol inhaler for q 4 hr prn use. Recommend treating COPD with long acting bronchodilator product such as Symbicort, Advair, Anoro, etc.  Nutrition: Eating well, no weight changes.   Fatigue: denies now, states he sleeps well. CPAP use at night would help daytime fatigue as well. Mood appears improved.  3. Family /Caregiver/Community Supports: Sister is POA, have not been able to reach her for some months. Lives in [place].  4. Cognitive / Functional decline: Alert, oriented x 2. States limited ability  to do adls, dependent in iadls.   5. Follow up Palliative Care Visit: Palliative care will continue to follow for goals of care clarification and symptom management. Return 6 weeks or prn.  I spent  35 minutes providing this consultation,  from 1130 to 1205. More than 50% of the time in this consultation was spent coordinating communication.   HISTORY OF PRESENT ILLNESS:  Anthony Chambers is a 74 y.o. year old male with multiple medical problems including COPD, hypoxia, depression. Palliative Care was asked to follow this patient by consultation request of Juluis Pitch, MD to help address advance care planning and goals of care. This is a follow up visit.  CODE STATUS: FULL CODE  PPS: 40%  HOSPICE ELIGIBILITY/DIAGNOSIS: no  PAST MEDICAL HISTORY:  Past Medical History:  Diagnosis Date  . Depression   . GERD (gastroesophageal reflux disease)   . Hyperlipidemia   . Hypertension   . Schizophrenia ([place])   . Total occlusion of coronary artery, chronic     SOCIAL HX:  Social History   Tobacco Use  . Smoking status: Former Smoker    Years: 40.00    Types: Cigarettes  . Smokeless tobacco: Never Used  Substance Use Topics  . Alcohol use: Not Currently    ALLERGIES:  Allergies  Allergen Reactions  . Heparin      PERTINENT MEDICATIONS:  Outpatient Encounter Medications as of 07/15/2019  Medication Sig  . acetaminophen (TYLENOL) 325 MG tablet Take by mouth every 4 (four) hours as needed for mild pain or moderate pain.  Marland Kitchen acetaminophen (TYLENOL) 500 MG  tablet Take 1,000 mg by mouth at bedtime.  Marland Kitchen albuterol (VENTOLIN HFA) 108 (90 Base) MCG/ACT inhaler   . furosemide (LASIX) 40 MG tablet Take 40 mg by mouth daily.  Marland Kitchen guaifenesin (ROBITUSSIN) 100 MG/5ML syrup Take 10 mLs by mouth every 6 (six) hours as needed for cough.  . Melatonin 5 MG TABS Take by mouth.  . metoprolol succinate (TOPROL-XL) 25 MG 24 hr tablet Take 25 mg by mouth daily.  . Multiple Vitamin (MULTIVITAMIN WITH  MINERALS) TABS tablet Take 1 tablet by mouth daily.  Marland Kitchen OLANZapine (ZYPREXA) 15 MG tablet Take 30 mg by mouth daily.  Marland Kitchen omeprazole (PRILOSEC) 20 MG capsule Take 20 mg by mouth daily.  Marland Kitchen PARoxetine (PAXIL-CR) 25 MG 24 hr tablet Take 25 mg by mouth daily.  . risperiDONE (RISPERDAL) 2 MG tablet Take 1 mg by mouth 2 (two) times daily.   . rivaroxaban (XARELTO) 20 MG TABS tablet Take 20 mg by mouth daily.  Marland Kitchen saccharomyces boulardii (FLORASTOR) 250 MG capsule Take 250 mg by mouth 2 (two) times daily.  . sennosides-docusate sodium (SENOKOT-S) 8.6-50 MG tablet Take 1 tablet by mouth in the morning and at bedtime.  . simvastatin (ZOCOR) 20 MG tablet Take 20 mg by mouth at bedtime.  . tamsulosin (FLOMAX) 0.4 MG CAPS capsule Take 1 capsule (0.4 mg total) by mouth daily after supper.  . triamcinolone cream (KENALOG) 0.5 % Apply 1 application topically 2 (two) times daily.  Marland Kitchen albuterol (ACCUNEB) 0.63 MG/3ML nebulizer solution Inhale into the lungs. (Patient not taking: Reported on 07/15/2019)  . albuterol (PROVENTIL) (2.5 MG/3ML) 0.083% nebulizer solution Take 3 mLs (2.5 mg total) by nebulization every 2 (two) hours as needed for wheezing or shortness of breath. (Patient not taking: Reported on 07/15/2019)  . [DISCONTINUED] Budesonide (PULMICORT FLEXHALER) 90 MCG/ACT inhaler Inhale into the lungs.  . [DISCONTINUED] dexamethasone (DECADRON) 6 MG tablet Take 6 mg by mouth once.   No facility-administered encounter medications on file as of 07/15/2019.    PHYSICAL EXAM / ROS:   Current and past weights: 251.8 lbs. 20 lbs gain in 6 months. General: NAD, frail and fatigued appearing, obese Cardiovascular: S1, S2, no chest pain reported, 2+  edema  Pulmonary:Lungs auscultated, limited air movement, no cough, reports no increased SOB at rest,  oxygen 4 L Abdomen: appetite good, bowel sounds normoactive all quadrants, denies  constipation, continent of bowel GU: denies dysuria, continent of urine, able to use  urinal. MSK:  + joint and ROM abnormalities, non ambulatory, denies falls Skin: no rashes or wounds reported or noted Neurological: Weakness, limited cognition, denies pain, denies insomnia.  Jason Coop, NP [facility]  COVID-19 PATIENT SCREENING TOOL  Person answering questions: ____________Staff_______ _____   1.  Is the patient or any family member in the home showing any signs or symptoms regarding respiratory infection?               Person with Symptom- __________NA_________________  a. Fever                                                                          Yes___ No___          ___________________  b. Shortness of breath  Yes___ No___          ___________________ c. Cough/congestion                                       Yes___  No___         ___________________ d. Body aches/pains                                                         Yes___ No___        ____________________ e. Gastrointestinal symptoms (diarrhea, nausea)           Yes___ No___        ____________________  2. Within the past 14 days, has anyone living in the home had any contact with someone with or under investigation for COVID-19?    Yes___ No_X_   Person __________________   

## 2019-07-21 DIAGNOSIS — R262 Difficulty in walking, not elsewhere classified: Secondary | ICD-10-CM | POA: Diagnosis not present

## 2019-07-21 DIAGNOSIS — I739 Peripheral vascular disease, unspecified: Secondary | ICD-10-CM | POA: Diagnosis not present

## 2019-07-21 DIAGNOSIS — L603 Nail dystrophy: Secondary | ICD-10-CM | POA: Diagnosis not present

## 2019-07-21 DIAGNOSIS — R600 Localized edema: Secondary | ICD-10-CM | POA: Diagnosis not present

## 2019-08-10 DIAGNOSIS — E785 Hyperlipidemia, unspecified: Secondary | ICD-10-CM | POA: Diagnosis not present

## 2019-08-10 DIAGNOSIS — I2582 Chronic total occlusion of coronary artery: Secondary | ICD-10-CM | POA: Diagnosis not present

## 2019-08-10 DIAGNOSIS — R498 Other voice and resonance disorders: Secondary | ICD-10-CM | POA: Diagnosis not present

## 2019-08-10 DIAGNOSIS — I739 Peripheral vascular disease, unspecified: Secondary | ICD-10-CM | POA: Diagnosis not present

## 2019-08-10 DIAGNOSIS — I10 Essential (primary) hypertension: Secondary | ICD-10-CM | POA: Diagnosis not present

## 2019-08-10 DIAGNOSIS — K219 Gastro-esophageal reflux disease without esophagitis: Secondary | ICD-10-CM | POA: Diagnosis not present

## 2019-08-10 DIAGNOSIS — R52 Pain, unspecified: Secondary | ICD-10-CM | POA: Diagnosis not present

## 2019-08-10 DIAGNOSIS — E539 Vitamin B deficiency, unspecified: Secondary | ICD-10-CM | POA: Diagnosis not present

## 2019-08-10 DIAGNOSIS — M6281 Muscle weakness (generalized): Secondary | ICD-10-CM | POA: Diagnosis not present

## 2019-08-10 DIAGNOSIS — J441 Chronic obstructive pulmonary disease with (acute) exacerbation: Secondary | ICD-10-CM | POA: Diagnosis not present

## 2019-08-11 DIAGNOSIS — E539 Vitamin B deficiency, unspecified: Secondary | ICD-10-CM | POA: Diagnosis not present

## 2019-08-11 DIAGNOSIS — E785 Hyperlipidemia, unspecified: Secondary | ICD-10-CM | POA: Diagnosis not present

## 2019-08-11 DIAGNOSIS — R52 Pain, unspecified: Secondary | ICD-10-CM | POA: Diagnosis not present

## 2019-08-11 DIAGNOSIS — J441 Chronic obstructive pulmonary disease with (acute) exacerbation: Secondary | ICD-10-CM | POA: Diagnosis not present

## 2019-08-11 DIAGNOSIS — K219 Gastro-esophageal reflux disease without esophagitis: Secondary | ICD-10-CM | POA: Diagnosis not present

## 2019-08-11 DIAGNOSIS — I739 Peripheral vascular disease, unspecified: Secondary | ICD-10-CM | POA: Diagnosis not present

## 2019-08-11 DIAGNOSIS — M6281 Muscle weakness (generalized): Secondary | ICD-10-CM | POA: Diagnosis not present

## 2019-08-11 DIAGNOSIS — R498 Other voice and resonance disorders: Secondary | ICD-10-CM | POA: Diagnosis not present

## 2019-08-11 DIAGNOSIS — I10 Essential (primary) hypertension: Secondary | ICD-10-CM | POA: Diagnosis not present

## 2019-08-11 DIAGNOSIS — I2582 Chronic total occlusion of coronary artery: Secondary | ICD-10-CM | POA: Diagnosis not present

## 2019-08-12 DIAGNOSIS — I739 Peripheral vascular disease, unspecified: Secondary | ICD-10-CM | POA: Diagnosis not present

## 2019-08-12 DIAGNOSIS — K219 Gastro-esophageal reflux disease without esophagitis: Secondary | ICD-10-CM | POA: Diagnosis not present

## 2019-08-12 DIAGNOSIS — Z01818 Encounter for other preprocedural examination: Secondary | ICD-10-CM | POA: Diagnosis not present

## 2019-08-12 DIAGNOSIS — R498 Other voice and resonance disorders: Secondary | ICD-10-CM | POA: Diagnosis not present

## 2019-08-12 DIAGNOSIS — R0600 Dyspnea, unspecified: Secondary | ICD-10-CM | POA: Diagnosis not present

## 2019-08-12 DIAGNOSIS — E539 Vitamin B deficiency, unspecified: Secondary | ICD-10-CM | POA: Diagnosis not present

## 2019-08-12 DIAGNOSIS — E785 Hyperlipidemia, unspecified: Secondary | ICD-10-CM | POA: Diagnosis not present

## 2019-08-12 DIAGNOSIS — M6281 Muscle weakness (generalized): Secondary | ICD-10-CM | POA: Diagnosis not present

## 2019-08-12 DIAGNOSIS — R52 Pain, unspecified: Secondary | ICD-10-CM | POA: Diagnosis not present

## 2019-08-12 DIAGNOSIS — I10 Essential (primary) hypertension: Secondary | ICD-10-CM | POA: Diagnosis not present

## 2019-08-12 DIAGNOSIS — I2582 Chronic total occlusion of coronary artery: Secondary | ICD-10-CM | POA: Diagnosis not present

## 2019-08-12 DIAGNOSIS — J441 Chronic obstructive pulmonary disease with (acute) exacerbation: Secondary | ICD-10-CM | POA: Diagnosis not present

## 2019-08-13 DIAGNOSIS — J441 Chronic obstructive pulmonary disease with (acute) exacerbation: Secondary | ICD-10-CM | POA: Diagnosis not present

## 2019-08-13 DIAGNOSIS — I2582 Chronic total occlusion of coronary artery: Secondary | ICD-10-CM | POA: Diagnosis not present

## 2019-08-13 DIAGNOSIS — E539 Vitamin B deficiency, unspecified: Secondary | ICD-10-CM | POA: Diagnosis not present

## 2019-08-13 DIAGNOSIS — R498 Other voice and resonance disorders: Secondary | ICD-10-CM | POA: Diagnosis not present

## 2019-08-13 DIAGNOSIS — R52 Pain, unspecified: Secondary | ICD-10-CM | POA: Diagnosis not present

## 2019-08-13 DIAGNOSIS — K219 Gastro-esophageal reflux disease without esophagitis: Secondary | ICD-10-CM | POA: Diagnosis not present

## 2019-08-13 DIAGNOSIS — I739 Peripheral vascular disease, unspecified: Secondary | ICD-10-CM | POA: Diagnosis not present

## 2019-08-13 DIAGNOSIS — I10 Essential (primary) hypertension: Secondary | ICD-10-CM | POA: Diagnosis not present

## 2019-08-13 DIAGNOSIS — E785 Hyperlipidemia, unspecified: Secondary | ICD-10-CM | POA: Diagnosis not present

## 2019-08-13 DIAGNOSIS — M6281 Muscle weakness (generalized): Secondary | ICD-10-CM | POA: Diagnosis not present

## 2019-08-14 DIAGNOSIS — R52 Pain, unspecified: Secondary | ICD-10-CM | POA: Diagnosis not present

## 2019-08-14 DIAGNOSIS — I739 Peripheral vascular disease, unspecified: Secondary | ICD-10-CM | POA: Diagnosis not present

## 2019-08-14 DIAGNOSIS — J441 Chronic obstructive pulmonary disease with (acute) exacerbation: Secondary | ICD-10-CM | POA: Diagnosis not present

## 2019-08-14 DIAGNOSIS — R498 Other voice and resonance disorders: Secondary | ICD-10-CM | POA: Diagnosis not present

## 2019-08-14 DIAGNOSIS — E785 Hyperlipidemia, unspecified: Secondary | ICD-10-CM | POA: Diagnosis not present

## 2019-08-14 DIAGNOSIS — I10 Essential (primary) hypertension: Secondary | ICD-10-CM | POA: Diagnosis not present

## 2019-08-14 DIAGNOSIS — K219 Gastro-esophageal reflux disease without esophagitis: Secondary | ICD-10-CM | POA: Diagnosis not present

## 2019-08-14 DIAGNOSIS — I2582 Chronic total occlusion of coronary artery: Secondary | ICD-10-CM | POA: Diagnosis not present

## 2019-08-14 DIAGNOSIS — E539 Vitamin B deficiency, unspecified: Secondary | ICD-10-CM | POA: Diagnosis not present

## 2019-08-14 DIAGNOSIS — M6281 Muscle weakness (generalized): Secondary | ICD-10-CM | POA: Diagnosis not present

## 2019-08-17 DIAGNOSIS — J441 Chronic obstructive pulmonary disease with (acute) exacerbation: Secondary | ICD-10-CM | POA: Diagnosis not present

## 2019-08-17 DIAGNOSIS — R498 Other voice and resonance disorders: Secondary | ICD-10-CM | POA: Diagnosis not present

## 2019-08-17 DIAGNOSIS — R52 Pain, unspecified: Secondary | ICD-10-CM | POA: Diagnosis not present

## 2019-08-17 DIAGNOSIS — I2582 Chronic total occlusion of coronary artery: Secondary | ICD-10-CM | POA: Diagnosis not present

## 2019-08-17 DIAGNOSIS — E539 Vitamin B deficiency, unspecified: Secondary | ICD-10-CM | POA: Diagnosis not present

## 2019-08-17 DIAGNOSIS — M6281 Muscle weakness (generalized): Secondary | ICD-10-CM | POA: Diagnosis not present

## 2019-08-17 DIAGNOSIS — K219 Gastro-esophageal reflux disease without esophagitis: Secondary | ICD-10-CM | POA: Diagnosis not present

## 2019-08-17 DIAGNOSIS — I739 Peripheral vascular disease, unspecified: Secondary | ICD-10-CM | POA: Diagnosis not present

## 2019-08-17 DIAGNOSIS — I10 Essential (primary) hypertension: Secondary | ICD-10-CM | POA: Diagnosis not present

## 2019-08-17 DIAGNOSIS — E785 Hyperlipidemia, unspecified: Secondary | ICD-10-CM | POA: Diagnosis not present

## 2019-08-18 DIAGNOSIS — K219 Gastro-esophageal reflux disease without esophagitis: Secondary | ICD-10-CM | POA: Diagnosis not present

## 2019-08-18 DIAGNOSIS — R52 Pain, unspecified: Secondary | ICD-10-CM | POA: Diagnosis not present

## 2019-08-18 DIAGNOSIS — J441 Chronic obstructive pulmonary disease with (acute) exacerbation: Secondary | ICD-10-CM | POA: Diagnosis not present

## 2019-08-18 DIAGNOSIS — I10 Essential (primary) hypertension: Secondary | ICD-10-CM | POA: Diagnosis not present

## 2019-08-18 DIAGNOSIS — M6281 Muscle weakness (generalized): Secondary | ICD-10-CM | POA: Diagnosis not present

## 2019-08-18 DIAGNOSIS — R498 Other voice and resonance disorders: Secondary | ICD-10-CM | POA: Diagnosis not present

## 2019-08-18 DIAGNOSIS — I2582 Chronic total occlusion of coronary artery: Secondary | ICD-10-CM | POA: Diagnosis not present

## 2019-08-18 DIAGNOSIS — I739 Peripheral vascular disease, unspecified: Secondary | ICD-10-CM | POA: Diagnosis not present

## 2019-08-18 DIAGNOSIS — E539 Vitamin B deficiency, unspecified: Secondary | ICD-10-CM | POA: Diagnosis not present

## 2019-08-18 DIAGNOSIS — E785 Hyperlipidemia, unspecified: Secondary | ICD-10-CM | POA: Diagnosis not present

## 2019-08-19 DIAGNOSIS — M6281 Muscle weakness (generalized): Secondary | ICD-10-CM | POA: Diagnosis not present

## 2019-08-19 DIAGNOSIS — E539 Vitamin B deficiency, unspecified: Secondary | ICD-10-CM | POA: Diagnosis not present

## 2019-08-19 DIAGNOSIS — J441 Chronic obstructive pulmonary disease with (acute) exacerbation: Secondary | ICD-10-CM | POA: Diagnosis not present

## 2019-08-19 DIAGNOSIS — E785 Hyperlipidemia, unspecified: Secondary | ICD-10-CM | POA: Diagnosis not present

## 2019-08-19 DIAGNOSIS — R52 Pain, unspecified: Secondary | ICD-10-CM | POA: Diagnosis not present

## 2019-08-19 DIAGNOSIS — I10 Essential (primary) hypertension: Secondary | ICD-10-CM | POA: Diagnosis not present

## 2019-08-19 DIAGNOSIS — I2582 Chronic total occlusion of coronary artery: Secondary | ICD-10-CM | POA: Diagnosis not present

## 2019-08-19 DIAGNOSIS — R498 Other voice and resonance disorders: Secondary | ICD-10-CM | POA: Diagnosis not present

## 2019-08-19 DIAGNOSIS — K219 Gastro-esophageal reflux disease without esophagitis: Secondary | ICD-10-CM | POA: Diagnosis not present

## 2019-08-19 DIAGNOSIS — I739 Peripheral vascular disease, unspecified: Secondary | ICD-10-CM | POA: Diagnosis not present

## 2019-08-20 DIAGNOSIS — R498 Other voice and resonance disorders: Secondary | ICD-10-CM | POA: Diagnosis not present

## 2019-08-20 DIAGNOSIS — I2582 Chronic total occlusion of coronary artery: Secondary | ICD-10-CM | POA: Diagnosis not present

## 2019-08-20 DIAGNOSIS — R52 Pain, unspecified: Secondary | ICD-10-CM | POA: Diagnosis not present

## 2019-08-20 DIAGNOSIS — E539 Vitamin B deficiency, unspecified: Secondary | ICD-10-CM | POA: Diagnosis not present

## 2019-08-20 DIAGNOSIS — E785 Hyperlipidemia, unspecified: Secondary | ICD-10-CM | POA: Diagnosis not present

## 2019-08-20 DIAGNOSIS — I739 Peripheral vascular disease, unspecified: Secondary | ICD-10-CM | POA: Diagnosis not present

## 2019-08-20 DIAGNOSIS — K219 Gastro-esophageal reflux disease without esophagitis: Secondary | ICD-10-CM | POA: Diagnosis not present

## 2019-08-20 DIAGNOSIS — M6281 Muscle weakness (generalized): Secondary | ICD-10-CM | POA: Diagnosis not present

## 2019-08-20 DIAGNOSIS — J441 Chronic obstructive pulmonary disease with (acute) exacerbation: Secondary | ICD-10-CM | POA: Diagnosis not present

## 2019-08-20 DIAGNOSIS — I10 Essential (primary) hypertension: Secondary | ICD-10-CM | POA: Diagnosis not present

## 2019-08-21 DIAGNOSIS — I10 Essential (primary) hypertension: Secondary | ICD-10-CM | POA: Diagnosis not present

## 2019-08-21 DIAGNOSIS — M6281 Muscle weakness (generalized): Secondary | ICD-10-CM | POA: Diagnosis not present

## 2019-08-21 DIAGNOSIS — I739 Peripheral vascular disease, unspecified: Secondary | ICD-10-CM | POA: Diagnosis not present

## 2019-08-21 DIAGNOSIS — J441 Chronic obstructive pulmonary disease with (acute) exacerbation: Secondary | ICD-10-CM | POA: Diagnosis not present

## 2019-08-21 DIAGNOSIS — I2582 Chronic total occlusion of coronary artery: Secondary | ICD-10-CM | POA: Diagnosis not present

## 2019-08-21 DIAGNOSIS — R52 Pain, unspecified: Secondary | ICD-10-CM | POA: Diagnosis not present

## 2019-08-21 DIAGNOSIS — K219 Gastro-esophageal reflux disease without esophagitis: Secondary | ICD-10-CM | POA: Diagnosis not present

## 2019-08-21 DIAGNOSIS — E539 Vitamin B deficiency, unspecified: Secondary | ICD-10-CM | POA: Diagnosis not present

## 2019-08-21 DIAGNOSIS — R498 Other voice and resonance disorders: Secondary | ICD-10-CM | POA: Diagnosis not present

## 2019-08-21 DIAGNOSIS — E785 Hyperlipidemia, unspecified: Secondary | ICD-10-CM | POA: Diagnosis not present

## 2019-08-22 DIAGNOSIS — J449 Chronic obstructive pulmonary disease, unspecified: Secondary | ICD-10-CM | POA: Diagnosis not present

## 2019-08-22 DIAGNOSIS — J45901 Unspecified asthma with (acute) exacerbation: Secondary | ICD-10-CM | POA: Diagnosis not present

## 2019-08-22 DIAGNOSIS — I10 Essential (primary) hypertension: Secondary | ICD-10-CM | POA: Diagnosis not present

## 2019-08-24 DIAGNOSIS — I10 Essential (primary) hypertension: Secondary | ICD-10-CM | POA: Diagnosis not present

## 2019-08-24 DIAGNOSIS — Z79899 Other long term (current) drug therapy: Secondary | ICD-10-CM | POA: Diagnosis not present

## 2019-08-24 DIAGNOSIS — E119 Type 2 diabetes mellitus without complications: Secondary | ICD-10-CM | POA: Diagnosis not present

## 2019-08-24 DIAGNOSIS — E785 Hyperlipidemia, unspecified: Secondary | ICD-10-CM | POA: Diagnosis not present

## 2019-08-24 DIAGNOSIS — D649 Anemia, unspecified: Secondary | ICD-10-CM | POA: Diagnosis not present

## 2019-10-13 DIAGNOSIS — R262 Difficulty in walking, not elsewhere classified: Secondary | ICD-10-CM | POA: Diagnosis not present

## 2019-10-13 DIAGNOSIS — M6281 Muscle weakness (generalized): Secondary | ICD-10-CM | POA: Diagnosis not present

## 2019-10-13 DIAGNOSIS — I739 Peripheral vascular disease, unspecified: Secondary | ICD-10-CM | POA: Diagnosis not present

## 2019-10-13 DIAGNOSIS — L603 Nail dystrophy: Secondary | ICD-10-CM | POA: Diagnosis not present

## 2019-10-17 DIAGNOSIS — I10 Essential (primary) hypertension: Secondary | ICD-10-CM | POA: Diagnosis not present

## 2019-10-17 DIAGNOSIS — J449 Chronic obstructive pulmonary disease, unspecified: Secondary | ICD-10-CM | POA: Diagnosis not present

## 2019-10-17 DIAGNOSIS — F32A Depression, unspecified: Secondary | ICD-10-CM | POA: Diagnosis not present

## 2019-10-17 DIAGNOSIS — J45901 Unspecified asthma with (acute) exacerbation: Secondary | ICD-10-CM | POA: Diagnosis not present

## 2019-10-20 ENCOUNTER — Other Ambulatory Visit: Payer: Self-pay

## 2019-10-20 ENCOUNTER — Non-Acute Institutional Stay: Payer: Medicare Other | Admitting: Primary Care

## 2019-10-20 DIAGNOSIS — Z515 Encounter for palliative care: Secondary | ICD-10-CM

## 2019-10-20 DIAGNOSIS — K219 Gastro-esophageal reflux disease without esophagitis: Secondary | ICD-10-CM | POA: Insufficient documentation

## 2019-10-20 DIAGNOSIS — I739 Peripheral vascular disease, unspecified: Secondary | ICD-10-CM

## 2019-10-20 DIAGNOSIS — E785 Hyperlipidemia, unspecified: Secondary | ICD-10-CM | POA: Insufficient documentation

## 2019-10-20 DIAGNOSIS — I82409 Acute embolism and thrombosis of unspecified deep veins of unspecified lower extremity: Secondary | ICD-10-CM | POA: Insufficient documentation

## 2019-10-20 NOTE — Progress Notes (Signed)
[facility] Consult Note Telephone: [phone]  Fax: [phone]  PATIENT NAME: Anthony Chambers [address] [place] [address] [phone] (home)  DOB: 11-Nov-1945 MRN: [id number]  PRIMARY CARE PROVIDER:    Juluis Pitch, MD,  [address] [place] [address] [phone]  REFERRING PROVIDER:   Juluis Pitch, MD [address] [place],  [place] [address] [phone]  RESPONSIBLE PARTY:   Extended Emergency Contact Information Primary Emergency Contact: Eddie Candle Address: [address]          [place], [place] [address] Home Phone: [phone] Work Phone: [phone] Relation: Sister Secondary Emergency Contact: Lucia Bitter Address: [address]          [place], [place] [address] Mobile Phone: [phone] Relation: Relative  I met face to face with patient In facility.   ASSESSMENT AND RECOMMENDATIONS:   1. Advance Care Planning/Goals of Care: Goals include to maximize quality of life and symptom management. Currently MOST on file with full scope of services, CPR, determine use of abx, feeding tube and iv hydration.   2. Symptom Management:   I met with Anthony Chambers in his nursing home room. Today he endorses feeling well. He is in his chair with his oxygen off. He has a nasal cannula in his nose and did not realize the oxygen machine was off. His pulse ox was 88% resting. I re-initiated the oxygen at 2 L. He endorses that he's eating well and staff endorses stable weight. He denies pain and states that his lower extremities are being treated with a medicated cream, likely for dermatitis due to poor circulation.   He has asked about his family. He states he has not heard from them or received any packages from them in a very long time. We discussed his sister's poor health. He says he hasn't talked to her in over a year and asked for my help to place a call. I investigated the numbers that he had and there were mistakes in both the  number for his sister and his brother-in-law. We corrected the mistakes in the phone numbers on record and he placed a call to his brother-in-law, who answered. He spoke with him for a while asking him to bring in some snacks and asking them how they had been. Patient also discussed his collection of Snoopy characters and the ones that he has left to get. He appears to be in good spirits today relaxed and thriving. I will follow up in three months.   3. Follow up Palliative Care Visit: Palliative care will continue to follow for goals of care clarification and symptom management. Return 12 weeks or prn.  4. Family /Caregiver/Community Supports: Sister Freda Munro is POA. Her husband is Herbie [place] and their 2 sons are Erlene Quan and Blackstone . Has not been in touch with family lately. We found their number today. Pt encouraged them to visit in person.  5. Cognitive / Functional decline:   I spent 45 minutes providing this consultation,  from 1400 to 1445. More than 50% of the time in this consultation was spent coordinating communication.   CHIEF COMPLAINT: PVD, debility, depression  HISTORY OF PRESENT ILLNESS:  OLLIN Chambers is a 74 y.o. year old male  with history of PVD, cognitive impairment, and depression. We are asked to consult around goals of care for symptom management.    Palliative Care was asked to follow this patient by consultation request of Juluis Pitch, MD to help address advance care planning  and goals of care. This is a follow up visit.  CODE STATUS: FULL CODE full scope of treatments  PPS: 40%  HOSPICE ELIGIBILITY/DIAGNOSIS: no  ROS  General: NAD EYES: denies vision change ENMT: denies dysphagia Cardiovascular: denies chest pain Pulmonary: endorses cough, DOE;  denies increased SOB,  Abdomen: endorses good appetite, endorses occ  constipation, endorses continence of bowel GU: denies dysuria, endorses continence of urine MSK:  endorses ROM limitations, no falls reported Skin:  denies rashes or wounds Neurological: endorses weakness, denies pain, denies insomnia Psych: Endorses positive mood  Physical Exam: Current and past weights:sable Constitutional:  NAD General :frail appearing, /Obese  EYES: anicteric sclera,EOMI, lids intact, no discharge  ENMT: intact hearing,oral mucous membranes moist, poor dentition CV: S1S2, RRR, no LE edema Pulmonary: LCTA, no increased work of breathing, no cough, no audible wheezes, oxygen 4 l / Zinc. Abdomen: intake 100%, normo-active BS +  4 quadrants, soft and non tender, no ascites GU: deferred MSK: mild sacropenia, decreased ROM in all extremities,  non ambulatory Skin: warm and dry, pre-tibias bil with purple skin, reduced circulation. Psych: non -anxious affect, A and O x 2  \ CURRENT PROBLEM LIST:  Patient Active Problem List   Diagnosis Date Noted  . DVT (deep venous thrombosis) ([place]) 10/20/2019  . GERD (gastroesophageal reflux disease) 10/20/2019  . Hyperlipidemia 10/20/2019  . Acute respiratory failure with hypoxia and hypercapnia ([place]) 06/01/2018  . PVD (peripheral vascular disease) ([place]) 08/15/2012   PAST MEDICAL HISTORY:  Past Medical History:  Diagnosis Date  . Depression   . GERD (gastroesophageal reflux disease)   . Hyperlipidemia   . Hypertension   . Schizophrenia ([place])   . Total occlusion of coronary artery, chronic     SOCIAL HX:  Social History   Tobacco Use  . Smoking status: Former Smoker    Years: 40.00    Types: Cigarettes  . Smokeless tobacco: Never Used  Substance Use Topics  . Alcohol use: Not Currently   FAMILY HX:  Family History  Problem Relation Age of Onset  . Asthma Mother     ALLERGIES:  Allergies  Allergen Reactions  . Heparin      PERTINENT MEDICATIONS:  Outpatient Encounter Medications as of 10/20/2019  Medication Sig  . acetaminophen (TYLENOL) 325 MG tablet Take by mouth every 4 (four) hours as needed for mild pain or moderate pain.  Marland Kitchen acetaminophen (TYLENOL)  500 MG tablet Take 1,000 mg by mouth at bedtime.  Marland Kitchen albuterol (VENTOLIN HFA) 108 (90 Base) MCG/ACT inhaler Inhale 2 puffs into the lungs every 4 (four) hours as needed for wheezing or shortness of breath.  . furosemide (LASIX) 40 MG tablet Take 40 mg by mouth daily.  Marland Kitchen guaifenesin (ROBITUSSIN) 100 MG/5ML syrup Take 200 mg by mouth every 6 (six) hours as needed for cough.  . Ipratropium-Albuterol (COMBIVENT RESPIMAT) 20-100 MCG/ACT AERS respimat Inhale 1 puff into the lungs in the morning, at noon, in the evening, and at bedtime.  . Melatonin 5 MG TABS Take 5 mg by mouth at bedtime.   . metoprolol succinate (TOPROL-XL) 25 MG 24 hr tablet Take 25 mg by mouth daily.  . Multiple Vitamin (MULTIVITAMIN WITH MINERALS) TABS tablet Take 1 tablet by mouth daily.  Marland Kitchen OLANZapine (ZYPREXA) 15 MG tablet Take 30 mg by mouth daily.  Marland Kitchen omeprazole (PRILOSEC) 20 MG capsule Take 20 mg by mouth daily.  . OXYGEN Inhale 4 L/min into the lungs continuous.  Marland Kitchen PARoxetine (PAXIL-CR) 25 MG 24 hr tablet  Take 25 mg by mouth daily.  . risperiDONE (RISPERDAL) 1 MG tablet Take 1 mg by mouth 2 (two) times daily.   . rivaroxaban (XARELTO) 20 MG TABS tablet Take 20 mg by mouth daily.  Marland Kitchen saccharomyces boulardii (FLORASTOR) 250 MG capsule Take 250 mg by mouth 2 (two) times daily.  . sennosides-docusate sodium (SENOKOT-S) 8.6-50 MG tablet Take 1 tablet by mouth in the morning and at bedtime.  . simvastatin (ZOCOR) 20 MG tablet Take 20 mg by mouth at bedtime.  . tamsulosin (FLOMAX) 0.4 MG CAPS capsule Take 1 capsule (0.4 mg total) by mouth daily after supper.  . triamcinolone cream (KENALOG) 0.5 % Apply 1 application topically 2 (two) times daily.  . [DISCONTINUED] albuterol (ACCUNEB) 0.63 MG/3ML nebulizer solution Inhale into the lungs. (Patient not taking: Reported on 07/15/2019)  . [DISCONTINUED] albuterol (PROVENTIL) (2.5 MG/3ML) 0.083% nebulizer solution Take 3 mLs (2.5 mg total) by nebulization every 2 (two) hours as needed for  wheezing or shortness of breath. (Patient not taking: Reported on 07/15/2019)  . [DISCONTINUED] albuterol (VENTOLIN HFA) 108 (90 Base) MCG/ACT inhaler   . [DISCONTINUED] guaifenesin (ROBITUSSIN) 100 MG/5ML syrup Take 10 mLs by mouth every 6 (six) hours as needed for cough.   No facility-administered encounter medications on file as of 10/20/2019.     Jason Coop, NP , DNP, MPH, AGPCNP-BC, ACHPN  COVID-19 PATIENT SCREENING TOOL  Person answering questions: ____________staff______ _____   1.  Is the patient or any family member in the home showing any signs or symptoms regarding respiratory infection?               Person with Symptom- ________staff_________________  a. Fever                                                                          Yes___ No___          ___________________  b. Shortness of breath                                                    Yes___ No___          ___________________ c. Cough/congestion                                       Yes___  No___         ___________________ d. Body aches/pains                                                         Yes___ No___        ____________________ e. Gastrointestinal symptoms (diarrhea, nausea)           Yes___ No___        ____________________  2. Within the past 14 days, has anyone living in the home  had any contact with someone with or under investigation for COVID-19?    Yes___ No_X_   Person __________________

## 2019-10-26 DIAGNOSIS — R52 Pain, unspecified: Secondary | ICD-10-CM | POA: Diagnosis not present

## 2019-10-26 DIAGNOSIS — E539 Vitamin B deficiency, unspecified: Secondary | ICD-10-CM | POA: Diagnosis not present

## 2019-10-26 DIAGNOSIS — J441 Chronic obstructive pulmonary disease with (acute) exacerbation: Secondary | ICD-10-CM | POA: Diagnosis not present

## 2019-10-26 DIAGNOSIS — I509 Heart failure, unspecified: Secondary | ICD-10-CM | POA: Diagnosis not present

## 2019-10-26 DIAGNOSIS — Z23 Encounter for immunization: Secondary | ICD-10-CM | POA: Diagnosis not present

## 2019-10-26 DIAGNOSIS — I2582 Chronic total occlusion of coronary artery: Secondary | ICD-10-CM | POA: Diagnosis not present

## 2019-10-26 DIAGNOSIS — I10 Essential (primary) hypertension: Secondary | ICD-10-CM | POA: Diagnosis not present

## 2019-10-26 DIAGNOSIS — K219 Gastro-esophageal reflux disease without esophagitis: Secondary | ICD-10-CM | POA: Diagnosis not present

## 2019-10-26 DIAGNOSIS — E785 Hyperlipidemia, unspecified: Secondary | ICD-10-CM | POA: Diagnosis not present

## 2019-10-26 DIAGNOSIS — I739 Peripheral vascular disease, unspecified: Secondary | ICD-10-CM | POA: Diagnosis not present

## 2019-11-06 DIAGNOSIS — M6281 Muscle weakness (generalized): Secondary | ICD-10-CM | POA: Diagnosis not present

## 2019-11-06 DIAGNOSIS — I509 Heart failure, unspecified: Secondary | ICD-10-CM | POA: Diagnosis not present

## 2019-11-06 DIAGNOSIS — K219 Gastro-esophageal reflux disease without esophagitis: Secondary | ICD-10-CM | POA: Diagnosis not present

## 2019-11-06 DIAGNOSIS — J441 Chronic obstructive pulmonary disease with (acute) exacerbation: Secondary | ICD-10-CM | POA: Diagnosis not present

## 2019-11-06 DIAGNOSIS — E539 Vitamin B deficiency, unspecified: Secondary | ICD-10-CM | POA: Diagnosis not present

## 2019-11-06 DIAGNOSIS — E785 Hyperlipidemia, unspecified: Secondary | ICD-10-CM | POA: Diagnosis not present

## 2019-11-06 DIAGNOSIS — I739 Peripheral vascular disease, unspecified: Secondary | ICD-10-CM | POA: Diagnosis not present

## 2019-11-06 DIAGNOSIS — I2582 Chronic total occlusion of coronary artery: Secondary | ICD-10-CM | POA: Diagnosis not present

## 2019-11-06 DIAGNOSIS — R52 Pain, unspecified: Secondary | ICD-10-CM | POA: Diagnosis not present

## 2019-11-06 DIAGNOSIS — I10 Essential (primary) hypertension: Secondary | ICD-10-CM | POA: Diagnosis not present

## 2019-11-09 DIAGNOSIS — K219 Gastro-esophageal reflux disease without esophagitis: Secondary | ICD-10-CM | POA: Diagnosis not present

## 2019-11-09 DIAGNOSIS — I509 Heart failure, unspecified: Secondary | ICD-10-CM | POA: Diagnosis not present

## 2019-11-09 DIAGNOSIS — M6281 Muscle weakness (generalized): Secondary | ICD-10-CM | POA: Diagnosis not present

## 2019-11-09 DIAGNOSIS — E785 Hyperlipidemia, unspecified: Secondary | ICD-10-CM | POA: Diagnosis not present

## 2019-11-09 DIAGNOSIS — E539 Vitamin B deficiency, unspecified: Secondary | ICD-10-CM | POA: Diagnosis not present

## 2019-11-09 DIAGNOSIS — I2582 Chronic total occlusion of coronary artery: Secondary | ICD-10-CM | POA: Diagnosis not present

## 2019-11-09 DIAGNOSIS — J449 Chronic obstructive pulmonary disease, unspecified: Secondary | ICD-10-CM | POA: Diagnosis not present

## 2019-11-09 DIAGNOSIS — I739 Peripheral vascular disease, unspecified: Secondary | ICD-10-CM | POA: Diagnosis not present

## 2019-11-09 DIAGNOSIS — I10 Essential (primary) hypertension: Secondary | ICD-10-CM | POA: Diagnosis not present

## 2019-11-09 DIAGNOSIS — R52 Pain, unspecified: Secondary | ICD-10-CM | POA: Diagnosis not present

## 2019-11-10 DIAGNOSIS — K219 Gastro-esophageal reflux disease without esophagitis: Secondary | ICD-10-CM | POA: Diagnosis not present

## 2019-11-10 DIAGNOSIS — I10 Essential (primary) hypertension: Secondary | ICD-10-CM | POA: Diagnosis not present

## 2019-11-10 DIAGNOSIS — R52 Pain, unspecified: Secondary | ICD-10-CM | POA: Diagnosis not present

## 2019-11-10 DIAGNOSIS — E785 Hyperlipidemia, unspecified: Secondary | ICD-10-CM | POA: Diagnosis not present

## 2019-11-10 DIAGNOSIS — I739 Peripheral vascular disease, unspecified: Secondary | ICD-10-CM | POA: Diagnosis not present

## 2019-11-10 DIAGNOSIS — M6281 Muscle weakness (generalized): Secondary | ICD-10-CM | POA: Diagnosis not present

## 2019-11-10 DIAGNOSIS — J449 Chronic obstructive pulmonary disease, unspecified: Secondary | ICD-10-CM | POA: Diagnosis not present

## 2019-11-10 DIAGNOSIS — I2582 Chronic total occlusion of coronary artery: Secondary | ICD-10-CM | POA: Diagnosis not present

## 2019-11-10 DIAGNOSIS — I509 Heart failure, unspecified: Secondary | ICD-10-CM | POA: Diagnosis not present

## 2019-11-10 DIAGNOSIS — E539 Vitamin B deficiency, unspecified: Secondary | ICD-10-CM | POA: Diagnosis not present

## 2019-11-11 DIAGNOSIS — I2582 Chronic total occlusion of coronary artery: Secondary | ICD-10-CM | POA: Diagnosis not present

## 2019-11-11 DIAGNOSIS — M6281 Muscle weakness (generalized): Secondary | ICD-10-CM | POA: Diagnosis not present

## 2019-11-11 DIAGNOSIS — E539 Vitamin B deficiency, unspecified: Secondary | ICD-10-CM | POA: Diagnosis not present

## 2019-11-11 DIAGNOSIS — I509 Heart failure, unspecified: Secondary | ICD-10-CM | POA: Diagnosis not present

## 2019-11-11 DIAGNOSIS — J449 Chronic obstructive pulmonary disease, unspecified: Secondary | ICD-10-CM | POA: Diagnosis not present

## 2019-11-11 DIAGNOSIS — I10 Essential (primary) hypertension: Secondary | ICD-10-CM | POA: Diagnosis not present

## 2019-11-11 DIAGNOSIS — R52 Pain, unspecified: Secondary | ICD-10-CM | POA: Diagnosis not present

## 2019-11-11 DIAGNOSIS — E785 Hyperlipidemia, unspecified: Secondary | ICD-10-CM | POA: Diagnosis not present

## 2019-11-11 DIAGNOSIS — K219 Gastro-esophageal reflux disease without esophagitis: Secondary | ICD-10-CM | POA: Diagnosis not present

## 2019-11-11 DIAGNOSIS — I739 Peripheral vascular disease, unspecified: Secondary | ICD-10-CM | POA: Diagnosis not present

## 2019-11-12 DIAGNOSIS — I509 Heart failure, unspecified: Secondary | ICD-10-CM | POA: Diagnosis not present

## 2019-11-12 DIAGNOSIS — J449 Chronic obstructive pulmonary disease, unspecified: Secondary | ICD-10-CM | POA: Diagnosis not present

## 2019-11-12 DIAGNOSIS — R52 Pain, unspecified: Secondary | ICD-10-CM | POA: Diagnosis not present

## 2019-11-12 DIAGNOSIS — I739 Peripheral vascular disease, unspecified: Secondary | ICD-10-CM | POA: Diagnosis not present

## 2019-11-12 DIAGNOSIS — E785 Hyperlipidemia, unspecified: Secondary | ICD-10-CM | POA: Diagnosis not present

## 2019-11-12 DIAGNOSIS — E539 Vitamin B deficiency, unspecified: Secondary | ICD-10-CM | POA: Diagnosis not present

## 2019-11-12 DIAGNOSIS — K219 Gastro-esophageal reflux disease without esophagitis: Secondary | ICD-10-CM | POA: Diagnosis not present

## 2019-11-12 DIAGNOSIS — I10 Essential (primary) hypertension: Secondary | ICD-10-CM | POA: Diagnosis not present

## 2019-11-12 DIAGNOSIS — I2582 Chronic total occlusion of coronary artery: Secondary | ICD-10-CM | POA: Diagnosis not present

## 2019-11-12 DIAGNOSIS — M6281 Muscle weakness (generalized): Secondary | ICD-10-CM | POA: Diagnosis not present

## 2019-11-13 DIAGNOSIS — I10 Essential (primary) hypertension: Secondary | ICD-10-CM | POA: Diagnosis not present

## 2019-11-13 DIAGNOSIS — K219 Gastro-esophageal reflux disease without esophagitis: Secondary | ICD-10-CM | POA: Diagnosis not present

## 2019-11-13 DIAGNOSIS — E539 Vitamin B deficiency, unspecified: Secondary | ICD-10-CM | POA: Diagnosis not present

## 2019-11-13 DIAGNOSIS — I2582 Chronic total occlusion of coronary artery: Secondary | ICD-10-CM | POA: Diagnosis not present

## 2019-11-13 DIAGNOSIS — E785 Hyperlipidemia, unspecified: Secondary | ICD-10-CM | POA: Diagnosis not present

## 2019-11-13 DIAGNOSIS — I739 Peripheral vascular disease, unspecified: Secondary | ICD-10-CM | POA: Diagnosis not present

## 2019-11-13 DIAGNOSIS — I509 Heart failure, unspecified: Secondary | ICD-10-CM | POA: Diagnosis not present

## 2019-11-13 DIAGNOSIS — R52 Pain, unspecified: Secondary | ICD-10-CM | POA: Diagnosis not present

## 2019-11-13 DIAGNOSIS — M6281 Muscle weakness (generalized): Secondary | ICD-10-CM | POA: Diagnosis not present

## 2019-11-13 DIAGNOSIS — J449 Chronic obstructive pulmonary disease, unspecified: Secondary | ICD-10-CM | POA: Diagnosis not present

## 2019-11-16 DIAGNOSIS — I2582 Chronic total occlusion of coronary artery: Secondary | ICD-10-CM | POA: Diagnosis not present

## 2019-11-16 DIAGNOSIS — J449 Chronic obstructive pulmonary disease, unspecified: Secondary | ICD-10-CM | POA: Diagnosis not present

## 2019-11-16 DIAGNOSIS — E785 Hyperlipidemia, unspecified: Secondary | ICD-10-CM | POA: Diagnosis not present

## 2019-11-16 DIAGNOSIS — I10 Essential (primary) hypertension: Secondary | ICD-10-CM | POA: Diagnosis not present

## 2019-11-16 DIAGNOSIS — M6281 Muscle weakness (generalized): Secondary | ICD-10-CM | POA: Diagnosis not present

## 2019-11-16 DIAGNOSIS — K219 Gastro-esophageal reflux disease without esophagitis: Secondary | ICD-10-CM | POA: Diagnosis not present

## 2019-11-16 DIAGNOSIS — R52 Pain, unspecified: Secondary | ICD-10-CM | POA: Diagnosis not present

## 2019-11-16 DIAGNOSIS — I739 Peripheral vascular disease, unspecified: Secondary | ICD-10-CM | POA: Diagnosis not present

## 2019-11-16 DIAGNOSIS — I509 Heart failure, unspecified: Secondary | ICD-10-CM | POA: Diagnosis not present

## 2019-11-16 DIAGNOSIS — E539 Vitamin B deficiency, unspecified: Secondary | ICD-10-CM | POA: Diagnosis not present

## 2019-11-17 DIAGNOSIS — J449 Chronic obstructive pulmonary disease, unspecified: Secondary | ICD-10-CM | POA: Diagnosis not present

## 2019-11-17 DIAGNOSIS — E539 Vitamin B deficiency, unspecified: Secondary | ICD-10-CM | POA: Diagnosis not present

## 2019-11-17 DIAGNOSIS — E785 Hyperlipidemia, unspecified: Secondary | ICD-10-CM | POA: Diagnosis not present

## 2019-11-17 DIAGNOSIS — K219 Gastro-esophageal reflux disease without esophagitis: Secondary | ICD-10-CM | POA: Diagnosis not present

## 2019-11-17 DIAGNOSIS — I509 Heart failure, unspecified: Secondary | ICD-10-CM | POA: Diagnosis not present

## 2019-11-17 DIAGNOSIS — M6281 Muscle weakness (generalized): Secondary | ICD-10-CM | POA: Diagnosis not present

## 2019-11-17 DIAGNOSIS — I739 Peripheral vascular disease, unspecified: Secondary | ICD-10-CM | POA: Diagnosis not present

## 2019-11-17 DIAGNOSIS — I10 Essential (primary) hypertension: Secondary | ICD-10-CM | POA: Diagnosis not present

## 2019-11-17 DIAGNOSIS — R52 Pain, unspecified: Secondary | ICD-10-CM | POA: Diagnosis not present

## 2019-11-17 DIAGNOSIS — I2582 Chronic total occlusion of coronary artery: Secondary | ICD-10-CM | POA: Diagnosis not present

## 2019-11-18 DIAGNOSIS — K219 Gastro-esophageal reflux disease without esophagitis: Secondary | ICD-10-CM | POA: Diagnosis not present

## 2019-11-18 DIAGNOSIS — E539 Vitamin B deficiency, unspecified: Secondary | ICD-10-CM | POA: Diagnosis not present

## 2019-11-18 DIAGNOSIS — J449 Chronic obstructive pulmonary disease, unspecified: Secondary | ICD-10-CM | POA: Diagnosis not present

## 2019-11-18 DIAGNOSIS — I2582 Chronic total occlusion of coronary artery: Secondary | ICD-10-CM | POA: Diagnosis not present

## 2019-11-18 DIAGNOSIS — E785 Hyperlipidemia, unspecified: Secondary | ICD-10-CM | POA: Diagnosis not present

## 2019-11-18 DIAGNOSIS — R52 Pain, unspecified: Secondary | ICD-10-CM | POA: Diagnosis not present

## 2019-11-18 DIAGNOSIS — M6281 Muscle weakness (generalized): Secondary | ICD-10-CM | POA: Diagnosis not present

## 2019-11-18 DIAGNOSIS — I509 Heart failure, unspecified: Secondary | ICD-10-CM | POA: Diagnosis not present

## 2019-11-18 DIAGNOSIS — I739 Peripheral vascular disease, unspecified: Secondary | ICD-10-CM | POA: Diagnosis not present

## 2019-11-18 DIAGNOSIS — I10 Essential (primary) hypertension: Secondary | ICD-10-CM | POA: Diagnosis not present

## 2019-11-19 DIAGNOSIS — I509 Heart failure, unspecified: Secondary | ICD-10-CM | POA: Diagnosis not present

## 2019-11-19 DIAGNOSIS — K219 Gastro-esophageal reflux disease without esophagitis: Secondary | ICD-10-CM | POA: Diagnosis not present

## 2019-11-19 DIAGNOSIS — E785 Hyperlipidemia, unspecified: Secondary | ICD-10-CM | POA: Diagnosis not present

## 2019-11-19 DIAGNOSIS — E539 Vitamin B deficiency, unspecified: Secondary | ICD-10-CM | POA: Diagnosis not present

## 2019-11-19 DIAGNOSIS — I2582 Chronic total occlusion of coronary artery: Secondary | ICD-10-CM | POA: Diagnosis not present

## 2019-11-19 DIAGNOSIS — R52 Pain, unspecified: Secondary | ICD-10-CM | POA: Diagnosis not present

## 2019-11-19 DIAGNOSIS — I10 Essential (primary) hypertension: Secondary | ICD-10-CM | POA: Diagnosis not present

## 2019-11-19 DIAGNOSIS — I739 Peripheral vascular disease, unspecified: Secondary | ICD-10-CM | POA: Diagnosis not present

## 2019-11-19 DIAGNOSIS — M6281 Muscle weakness (generalized): Secondary | ICD-10-CM | POA: Diagnosis not present

## 2019-11-19 DIAGNOSIS — J449 Chronic obstructive pulmonary disease, unspecified: Secondary | ICD-10-CM | POA: Diagnosis not present

## 2019-12-16 DIAGNOSIS — J9611 Chronic respiratory failure with hypoxia: Secondary | ICD-10-CM | POA: Diagnosis not present

## 2019-12-25 DIAGNOSIS — R600 Localized edema: Secondary | ICD-10-CM | POA: Diagnosis not present

## 2019-12-25 DIAGNOSIS — I5022 Chronic systolic (congestive) heart failure: Secondary | ICD-10-CM | POA: Diagnosis not present

## 2019-12-25 DIAGNOSIS — I739 Peripheral vascular disease, unspecified: Secondary | ICD-10-CM | POA: Diagnosis not present

## 2019-12-30 ENCOUNTER — Other Ambulatory Visit: Payer: Self-pay

## 2019-12-30 ENCOUNTER — Non-Acute Institutional Stay: Payer: Medicare Other | Admitting: Primary Care

## 2019-12-30 DIAGNOSIS — I739 Peripheral vascular disease, unspecified: Secondary | ICD-10-CM

## 2019-12-30 DIAGNOSIS — Z515 Encounter for palliative care: Secondary | ICD-10-CM

## 2019-12-30 NOTE — Progress Notes (Signed)
[facility] Consult Note Telephone: [phone]  Fax: [phone]     Date of encounter: 12/30/19 PATIENT NAME: Anthony Chambers [address] [place] [address] [phone] (home)  DOB: 04-Apr-1945 MRN: [id number]  PRIMARY CARE PROVIDER:    Jennette Bill., MD,  [address] [place] [address] [phone]  REFERRING PROVIDER:   Jennette Bill., MD,  [address] [place] [address] [phone]  RESPONSIBLE PARTY:   Extended Emergency Contact Information Primary Emergency Contact: Eddie Candle Address: [address]          [place], [place] [address] Home Phone: [phone] Work Phone: [phone] Relation: Sister Secondary Emergency Contact: Lucia Bitter Address: [address]          [place], [place] [address] Mobile Phone: [phone] Relation: Relative  I met face to face with patient in the facility. Palliative Care was asked to follow this patient by consultation request of Hester, Ronnette Hila., MD to help address advance care planning and goals of care. This is a follow up   visit.   ASSESSMENT AND RECOMMENDATIONS:   1. Advance Care Planning/Goals of Care: Goals include to maximize quality of life and symptom management. T/c to [facility], [place]. She has not returned call in some months. Need to review MOST which currently has Full code, full scope, determine use of abx, iv and feeding tube. Message left.  2. Symptom Management:   Patient feeling well, no c/o pain but has scratched an open wound over L eyebrow. He states good appetite and good rest. Has oxygen which he does not use continually. He applied during this visit. He is mostly W/c bound and stays in his room. He does not have any weight loss per staff report.   3. Follow up Palliative Care Visit: Palliative care will continue to follow for goals of care clarification and symptom management. Return 6-8 weeks or prn.  4. Family /Caregiver/Community  Supports: POA and guardian  is sister. Lives in [place].  5. Cognitive / Functional decline: A and O x 2. Enjoys stuffed Peanuts characters. Is dependent in most adls and all iadls.  I spent 25 minutes providing this consultation,  from 1030 to 1055. More than 50% of the time in this consultation was spent coordinating communication.   CODE STATUS:FULL CODE  PPS: 50%  HOSPICE ELIGIBILITY/DIAGNOSIS: TBD  Subjective:  CHIEF COMPLAINT: debility  HISTORY OF PRESENT ILLNESS:  Anthony Chambers is a 74 y.o. year old male  with debility, h/o schizophrenia and depression, lung disease.   We are asked to consult around advance care planning and complex medical decision making.  Marland Kitchen  History obtained from review of EMR, discussion with primary team, and  interview with family, caregiver  and/or Mr. Molnar. Records reviewed and summarized above.   CURRENT PROBLEM LIST:  Patient Active Problem List   Diagnosis Date Noted  . DVT (deep venous thrombosis) ([facility]) 10/20/2019  . GERD (gastroesophageal reflux disease) 10/20/2019  . Hyperlipidemia 10/20/2019  . Acute respiratory failure with hypoxia and hypercapnia ([facility]) 06/01/2018  . PVD (peripheral vascular disease) ([facility]) 08/15/2012   PAST MEDICAL HISTORY:  Active Ambulatory Problems    Diagnosis Date Noted  . Acute respiratory failure with hypoxia and hypercapnia ([facility]) 06/01/2018  . DVT (deep venous thrombosis) ([facility]) 10/20/2019  . GERD (gastroesophageal reflux disease) 10/20/2019  . Hyperlipidemia 10/20/2019  . PVD (peripheral vascular disease) ([facility]) 08/15/2012   Resolved Ambulatory Problems    Diagnosis Date  Noted  . No Resolved Ambulatory Problems   Past Medical History:  Diagnosis Date  . Depression   . Hypertension   . Schizophrenia (HCC)   . Total occlusion of coronary artery, chronic    SOCIAL HX:  Social History   Tobacco Use  . Smoking status: Former Smoker    Years: 40.00    Types: Cigarettes  . Smokeless tobacco: Never Used   Substance Use Topics  . Alcohol use: Not Currently   FAMILY HX:  Family History  Problem Relation Age of Onset  . Asthma Mother       ALLERGIES:  Allergies  Allergen Reactions  . Heparin      PERTINENT MEDICATIONS:  Outpatient Encounter Medications as of 12/30/2019  Medication Sig  . acetaminophen (TYLENOL) 325 MG tablet Take by mouth every 4 (four) hours as needed for mild pain or moderate pain.  Marland Kitchen acetaminophen (TYLENOL) 500 MG tablet Take 1,000 mg by mouth at bedtime.  Marland Kitchen albuterol (VENTOLIN HFA) 108 (90 Base) MCG/ACT inhaler Inhale 2 puffs into the lungs every 4 (four) hours as needed for wheezing or shortness of breath.  . furosemide (LASIX) 40 MG tablet Take 40 mg by mouth daily.  Marland Kitchen guaifenesin (ROBITUSSIN) 100 MG/5ML syrup Take 200 mg by mouth every 6 (six) hours as needed for cough.  . Ipratropium-Albuterol (COMBIVENT RESPIMAT) 20-100 MCG/ACT AERS respimat Inhale 1 puff into the lungs in the morning, at noon, in the evening, and at bedtime.  . Melatonin 5 MG TABS Take 5 mg by mouth at bedtime.   . metoprolol succinate (TOPROL-XL) 25 MG 24 hr tablet Take 25 mg by mouth daily.  . Multiple Vitamin (MULTIVITAMIN WITH MINERALS) TABS tablet Take 1 tablet by mouth daily.  Marland Kitchen OLANZapine (ZYPREXA) 15 MG tablet Take 30 mg by mouth daily.  Marland Kitchen omeprazole (PRILOSEC) 20 MG capsule Take 20 mg by mouth daily.  . OXYGEN Inhale 4 L/min into the lungs continuous.  Marland Kitchen PARoxetine (PAXIL-CR) 25 MG 24 hr tablet Take 25 mg by mouth daily.  . risperiDONE (RISPERDAL) 1 MG tablet Take 1 mg by mouth 2 (two) times daily.   . rivaroxaban (XARELTO) 20 MG TABS tablet Take 20 mg by mouth daily.  Marland Kitchen saccharomyces boulardii (FLORASTOR) 250 MG capsule Take 250 mg by mouth 2 (two) times daily.  . sennosides-docusate sodium (SENOKOT-S) 8.6-50 MG tablet Take 1 tablet by mouth in the morning and at bedtime.  . simvastatin (ZOCOR) 20 MG tablet Take 20 mg by mouth at bedtime.  . tamsulosin (FLOMAX) 0.4 MG CAPS capsule  Take 1 capsule (0.4 mg total) by mouth daily after supper.  . triamcinolone cream (KENALOG) 0.5 % Apply 1 application topically 2 (two) times daily.   No facility-administered encounter medications on file as of 12/30/2019.    Objective: ROS/staff   General: NAD EYES: denies vision changes ENMT: denies dysphagia Cardiovascular: denies chest pain Pulmonary: denies cough, denies increased SOB Abdomen: endorses good appetite, denies constipation, endorses continence of bowel GU: denies dysuria, endorses continence of urine MSK:  endorses ROM limitations, no falls reported Skin: endorses facial  Scratch wound Neurological: endorses baseline weakness, denies pain, denies insomnia Psych: Endorses positive mood Heme/lymph/immuno: denies bruises, abnormal bleeding  Physical Exam: Current and past weights:stable  Constitutional: NAD General: frail appearing,obese  EYES: anicteric sclera,lids intact, no discharge  ENMT: intact hearing,oral mucous membranes moist,  CV: S1S2, RRR, + LE edema Pulmonary: LCTA, no increased work of breathing, no cough, no audible wheezes, oxygen prn 2-4 L  Abdomen: intake 100%, no ascites MSK: mild sarcopenia, decreased ROM in all extremities, non ambulatory Skin: warm and dry, no rashes or wounds on visible skin Neuro: Generalized weakness, + cognitive impairment Psych: non-anxious affect, A and O x 1-2 Hem/lymph/immuno: no widespread bruising   Thank you for the opportunity to participate in the care of Mr. Lenig.  The palliative care team will continue to follow. Please call our office at [phone] if we can be of additional assistance.  Jason Coop, NP , DNP, MPH, AGPCNP-BC, ACHPN  COVID-19 PATIENT SCREENING TOOL  Person answering questions: ____________staff______ _____   1.  Is the patient or any family member in the home showing any signs or symptoms regarding respiratory infection?               Person with Symptom-  __________NA_________________  a. Fever                                                                          Yes___ No___          ___________________  b. Shortness of breath                                                    Yes___ No___          ___________________ c. Cough/congestion                                       Yes___  No___         ___________________ d. Body aches/pains                                                         Yes___ No___        ____________________ e. Gastrointestinal symptoms (diarrhea, nausea)           Yes___ No___        ____________________  2. Within the past 14 days, has anyone living in the home had any contact with someone with or under investigation for COVID-19?    Yes___ No_X_   Person __________________

## 2020-01-08 DIAGNOSIS — I5022 Chronic systolic (congestive) heart failure: Secondary | ICD-10-CM | POA: Diagnosis not present

## 2020-01-26 DIAGNOSIS — E569 Vitamin deficiency, unspecified: Secondary | ICD-10-CM | POA: Diagnosis not present

## 2020-01-26 DIAGNOSIS — I5022 Chronic systolic (congestive) heart failure: Secondary | ICD-10-CM | POA: Diagnosis not present

## 2020-01-26 DIAGNOSIS — R600 Localized edema: Secondary | ICD-10-CM | POA: Diagnosis not present

## 2020-01-26 DIAGNOSIS — I739 Peripheral vascular disease, unspecified: Secondary | ICD-10-CM | POA: Diagnosis not present

## 2020-01-26 DIAGNOSIS — E785 Hyperlipidemia, unspecified: Secondary | ICD-10-CM | POA: Diagnosis not present

## 2020-01-27 DIAGNOSIS — I10 Essential (primary) hypertension: Secondary | ICD-10-CM | POA: Diagnosis not present

## 2020-01-27 DIAGNOSIS — E559 Vitamin D deficiency, unspecified: Secondary | ICD-10-CM | POA: Diagnosis not present

## 2020-01-27 DIAGNOSIS — K219 Gastro-esophageal reflux disease without esophagitis: Secondary | ICD-10-CM | POA: Diagnosis not present

## 2020-01-27 DIAGNOSIS — R52 Pain, unspecified: Secondary | ICD-10-CM | POA: Diagnosis not present

## 2020-01-27 DIAGNOSIS — D519 Vitamin B12 deficiency anemia, unspecified: Secondary | ICD-10-CM | POA: Diagnosis not present

## 2020-01-27 DIAGNOSIS — E785 Hyperlipidemia, unspecified: Secondary | ICD-10-CM | POA: Diagnosis not present

## 2020-01-27 DIAGNOSIS — I2582 Chronic total occlusion of coronary artery: Secondary | ICD-10-CM | POA: Diagnosis not present

## 2020-01-27 DIAGNOSIS — R2681 Unsteadiness on feet: Secondary | ICD-10-CM | POA: Diagnosis not present

## 2020-01-27 DIAGNOSIS — I739 Peripheral vascular disease, unspecified: Secondary | ICD-10-CM | POA: Diagnosis not present

## 2020-01-27 DIAGNOSIS — E539 Vitamin B deficiency, unspecified: Secondary | ICD-10-CM | POA: Diagnosis not present

## 2020-01-27 DIAGNOSIS — J441 Chronic obstructive pulmonary disease with (acute) exacerbation: Secondary | ICD-10-CM | POA: Diagnosis not present

## 2020-01-28 DIAGNOSIS — I739 Peripheral vascular disease, unspecified: Secondary | ICD-10-CM | POA: Diagnosis not present

## 2020-01-28 DIAGNOSIS — E785 Hyperlipidemia, unspecified: Secondary | ICD-10-CM | POA: Diagnosis not present

## 2020-01-28 DIAGNOSIS — I10 Essential (primary) hypertension: Secondary | ICD-10-CM | POA: Diagnosis not present

## 2020-01-28 DIAGNOSIS — K219 Gastro-esophageal reflux disease without esophagitis: Secondary | ICD-10-CM | POA: Diagnosis not present

## 2020-01-28 DIAGNOSIS — J441 Chronic obstructive pulmonary disease with (acute) exacerbation: Secondary | ICD-10-CM | POA: Diagnosis not present

## 2020-01-28 DIAGNOSIS — R2681 Unsteadiness on feet: Secondary | ICD-10-CM | POA: Diagnosis not present

## 2020-01-28 DIAGNOSIS — E539 Vitamin B deficiency, unspecified: Secondary | ICD-10-CM | POA: Diagnosis not present

## 2020-01-28 DIAGNOSIS — R52 Pain, unspecified: Secondary | ICD-10-CM | POA: Diagnosis not present

## 2020-01-28 DIAGNOSIS — I2582 Chronic total occlusion of coronary artery: Secondary | ICD-10-CM | POA: Diagnosis not present

## 2020-01-29 DIAGNOSIS — E539 Vitamin B deficiency, unspecified: Secondary | ICD-10-CM | POA: Diagnosis not present

## 2020-01-29 DIAGNOSIS — I10 Essential (primary) hypertension: Secondary | ICD-10-CM | POA: Diagnosis not present

## 2020-01-29 DIAGNOSIS — R2681 Unsteadiness on feet: Secondary | ICD-10-CM | POA: Diagnosis not present

## 2020-01-29 DIAGNOSIS — K219 Gastro-esophageal reflux disease without esophagitis: Secondary | ICD-10-CM | POA: Diagnosis not present

## 2020-01-29 DIAGNOSIS — I739 Peripheral vascular disease, unspecified: Secondary | ICD-10-CM | POA: Diagnosis not present

## 2020-01-29 DIAGNOSIS — J441 Chronic obstructive pulmonary disease with (acute) exacerbation: Secondary | ICD-10-CM | POA: Diagnosis not present

## 2020-01-29 DIAGNOSIS — E785 Hyperlipidemia, unspecified: Secondary | ICD-10-CM | POA: Diagnosis not present

## 2020-01-29 DIAGNOSIS — R52 Pain, unspecified: Secondary | ICD-10-CM | POA: Diagnosis not present

## 2020-01-29 DIAGNOSIS — I2582 Chronic total occlusion of coronary artery: Secondary | ICD-10-CM | POA: Diagnosis not present

## 2020-02-01 DIAGNOSIS — R52 Pain, unspecified: Secondary | ICD-10-CM | POA: Diagnosis not present

## 2020-02-01 DIAGNOSIS — R2681 Unsteadiness on feet: Secondary | ICD-10-CM | POA: Diagnosis not present

## 2020-02-01 DIAGNOSIS — E785 Hyperlipidemia, unspecified: Secondary | ICD-10-CM | POA: Diagnosis not present

## 2020-02-01 DIAGNOSIS — I2582 Chronic total occlusion of coronary artery: Secondary | ICD-10-CM | POA: Diagnosis not present

## 2020-02-01 DIAGNOSIS — J441 Chronic obstructive pulmonary disease with (acute) exacerbation: Secondary | ICD-10-CM | POA: Diagnosis not present

## 2020-02-01 DIAGNOSIS — E539 Vitamin B deficiency, unspecified: Secondary | ICD-10-CM | POA: Diagnosis not present

## 2020-02-01 DIAGNOSIS — I739 Peripheral vascular disease, unspecified: Secondary | ICD-10-CM | POA: Diagnosis not present

## 2020-02-01 DIAGNOSIS — I10 Essential (primary) hypertension: Secondary | ICD-10-CM | POA: Diagnosis not present

## 2020-02-01 DIAGNOSIS — K219 Gastro-esophageal reflux disease without esophagitis: Secondary | ICD-10-CM | POA: Diagnosis not present

## 2020-02-02 DIAGNOSIS — R2681 Unsteadiness on feet: Secondary | ICD-10-CM | POA: Diagnosis not present

## 2020-02-02 DIAGNOSIS — E785 Hyperlipidemia, unspecified: Secondary | ICD-10-CM | POA: Diagnosis not present

## 2020-02-02 DIAGNOSIS — J441 Chronic obstructive pulmonary disease with (acute) exacerbation: Secondary | ICD-10-CM | POA: Diagnosis not present

## 2020-02-02 DIAGNOSIS — E539 Vitamin B deficiency, unspecified: Secondary | ICD-10-CM | POA: Diagnosis not present

## 2020-02-02 DIAGNOSIS — K219 Gastro-esophageal reflux disease without esophagitis: Secondary | ICD-10-CM | POA: Diagnosis not present

## 2020-02-02 DIAGNOSIS — I739 Peripheral vascular disease, unspecified: Secondary | ICD-10-CM | POA: Diagnosis not present

## 2020-02-02 DIAGNOSIS — I2582 Chronic total occlusion of coronary artery: Secondary | ICD-10-CM | POA: Diagnosis not present

## 2020-02-02 DIAGNOSIS — I10 Essential (primary) hypertension: Secondary | ICD-10-CM | POA: Diagnosis not present

## 2020-02-02 DIAGNOSIS — R52 Pain, unspecified: Secondary | ICD-10-CM | POA: Diagnosis not present

## 2020-02-03 DIAGNOSIS — R52 Pain, unspecified: Secondary | ICD-10-CM | POA: Diagnosis not present

## 2020-02-03 DIAGNOSIS — I739 Peripheral vascular disease, unspecified: Secondary | ICD-10-CM | POA: Diagnosis not present

## 2020-02-03 DIAGNOSIS — E539 Vitamin B deficiency, unspecified: Secondary | ICD-10-CM | POA: Diagnosis not present

## 2020-02-03 DIAGNOSIS — K219 Gastro-esophageal reflux disease without esophagitis: Secondary | ICD-10-CM | POA: Diagnosis not present

## 2020-02-03 DIAGNOSIS — I2582 Chronic total occlusion of coronary artery: Secondary | ICD-10-CM | POA: Diagnosis not present

## 2020-02-03 DIAGNOSIS — R2681 Unsteadiness on feet: Secondary | ICD-10-CM | POA: Diagnosis not present

## 2020-02-03 DIAGNOSIS — J441 Chronic obstructive pulmonary disease with (acute) exacerbation: Secondary | ICD-10-CM | POA: Diagnosis not present

## 2020-02-03 DIAGNOSIS — E785 Hyperlipidemia, unspecified: Secondary | ICD-10-CM | POA: Diagnosis not present

## 2020-02-03 DIAGNOSIS — I10 Essential (primary) hypertension: Secondary | ICD-10-CM | POA: Diagnosis not present

## 2020-02-04 DIAGNOSIS — R2681 Unsteadiness on feet: Secondary | ICD-10-CM | POA: Diagnosis not present

## 2020-02-04 DIAGNOSIS — J441 Chronic obstructive pulmonary disease with (acute) exacerbation: Secondary | ICD-10-CM | POA: Diagnosis not present

## 2020-02-04 DIAGNOSIS — I2582 Chronic total occlusion of coronary artery: Secondary | ICD-10-CM | POA: Diagnosis not present

## 2020-02-04 DIAGNOSIS — R52 Pain, unspecified: Secondary | ICD-10-CM | POA: Diagnosis not present

## 2020-02-04 DIAGNOSIS — E785 Hyperlipidemia, unspecified: Secondary | ICD-10-CM | POA: Diagnosis not present

## 2020-02-04 DIAGNOSIS — K219 Gastro-esophageal reflux disease without esophagitis: Secondary | ICD-10-CM | POA: Diagnosis not present

## 2020-02-04 DIAGNOSIS — E539 Vitamin B deficiency, unspecified: Secondary | ICD-10-CM | POA: Diagnosis not present

## 2020-02-04 DIAGNOSIS — I739 Peripheral vascular disease, unspecified: Secondary | ICD-10-CM | POA: Diagnosis not present

## 2020-02-04 DIAGNOSIS — I10 Essential (primary) hypertension: Secondary | ICD-10-CM | POA: Diagnosis not present

## 2020-02-05 DIAGNOSIS — I10 Essential (primary) hypertension: Secondary | ICD-10-CM | POA: Diagnosis not present

## 2020-02-05 DIAGNOSIS — E785 Hyperlipidemia, unspecified: Secondary | ICD-10-CM | POA: Diagnosis not present

## 2020-02-05 DIAGNOSIS — K219 Gastro-esophageal reflux disease without esophagitis: Secondary | ICD-10-CM | POA: Diagnosis not present

## 2020-02-05 DIAGNOSIS — R2681 Unsteadiness on feet: Secondary | ICD-10-CM | POA: Diagnosis not present

## 2020-02-05 DIAGNOSIS — E539 Vitamin B deficiency, unspecified: Secondary | ICD-10-CM | POA: Diagnosis not present

## 2020-02-05 DIAGNOSIS — R52 Pain, unspecified: Secondary | ICD-10-CM | POA: Diagnosis not present

## 2020-02-05 DIAGNOSIS — I2582 Chronic total occlusion of coronary artery: Secondary | ICD-10-CM | POA: Diagnosis not present

## 2020-02-05 DIAGNOSIS — I739 Peripheral vascular disease, unspecified: Secondary | ICD-10-CM | POA: Diagnosis not present

## 2020-02-05 DIAGNOSIS — J441 Chronic obstructive pulmonary disease with (acute) exacerbation: Secondary | ICD-10-CM | POA: Diagnosis not present

## 2020-02-08 DIAGNOSIS — K219 Gastro-esophageal reflux disease without esophagitis: Secondary | ICD-10-CM | POA: Diagnosis not present

## 2020-02-08 DIAGNOSIS — E539 Vitamin B deficiency, unspecified: Secondary | ICD-10-CM | POA: Diagnosis not present

## 2020-02-08 DIAGNOSIS — I2582 Chronic total occlusion of coronary artery: Secondary | ICD-10-CM | POA: Diagnosis not present

## 2020-02-08 DIAGNOSIS — R2681 Unsteadiness on feet: Secondary | ICD-10-CM | POA: Diagnosis not present

## 2020-02-08 DIAGNOSIS — I739 Peripheral vascular disease, unspecified: Secondary | ICD-10-CM | POA: Diagnosis not present

## 2020-02-08 DIAGNOSIS — E785 Hyperlipidemia, unspecified: Secondary | ICD-10-CM | POA: Diagnosis not present

## 2020-02-08 DIAGNOSIS — R52 Pain, unspecified: Secondary | ICD-10-CM | POA: Diagnosis not present

## 2020-02-08 DIAGNOSIS — I10 Essential (primary) hypertension: Secondary | ICD-10-CM | POA: Diagnosis not present

## 2020-02-08 DIAGNOSIS — J441 Chronic obstructive pulmonary disease with (acute) exacerbation: Secondary | ICD-10-CM | POA: Diagnosis not present

## 2020-02-09 DIAGNOSIS — J449 Chronic obstructive pulmonary disease, unspecified: Secondary | ICD-10-CM | POA: Diagnosis not present

## 2020-02-09 DIAGNOSIS — I2582 Chronic total occlusion of coronary artery: Secondary | ICD-10-CM | POA: Diagnosis not present

## 2020-02-09 DIAGNOSIS — E785 Hyperlipidemia, unspecified: Secondary | ICD-10-CM | POA: Diagnosis not present

## 2020-02-09 DIAGNOSIS — R2681 Unsteadiness on feet: Secondary | ICD-10-CM | POA: Diagnosis not present

## 2020-02-09 DIAGNOSIS — J441 Chronic obstructive pulmonary disease with (acute) exacerbation: Secondary | ICD-10-CM | POA: Diagnosis not present

## 2020-02-09 DIAGNOSIS — E539 Vitamin B deficiency, unspecified: Secondary | ICD-10-CM | POA: Diagnosis not present

## 2020-02-09 DIAGNOSIS — K219 Gastro-esophageal reflux disease without esophagitis: Secondary | ICD-10-CM | POA: Diagnosis not present

## 2020-02-09 DIAGNOSIS — I10 Essential (primary) hypertension: Secondary | ICD-10-CM | POA: Diagnosis not present

## 2020-02-09 DIAGNOSIS — R52 Pain, unspecified: Secondary | ICD-10-CM | POA: Diagnosis not present

## 2020-02-09 DIAGNOSIS — I739 Peripheral vascular disease, unspecified: Secondary | ICD-10-CM | POA: Diagnosis not present

## 2020-02-10 DIAGNOSIS — E785 Hyperlipidemia, unspecified: Secondary | ICD-10-CM | POA: Diagnosis not present

## 2020-02-10 DIAGNOSIS — E539 Vitamin B deficiency, unspecified: Secondary | ICD-10-CM | POA: Diagnosis not present

## 2020-02-10 DIAGNOSIS — I739 Peripheral vascular disease, unspecified: Secondary | ICD-10-CM | POA: Diagnosis not present

## 2020-02-10 DIAGNOSIS — I10 Essential (primary) hypertension: Secondary | ICD-10-CM | POA: Diagnosis not present

## 2020-02-10 DIAGNOSIS — J441 Chronic obstructive pulmonary disease with (acute) exacerbation: Secondary | ICD-10-CM | POA: Diagnosis not present

## 2020-02-10 DIAGNOSIS — K219 Gastro-esophageal reflux disease without esophagitis: Secondary | ICD-10-CM | POA: Diagnosis not present

## 2020-02-10 DIAGNOSIS — R52 Pain, unspecified: Secondary | ICD-10-CM | POA: Diagnosis not present

## 2020-02-10 DIAGNOSIS — I2582 Chronic total occlusion of coronary artery: Secondary | ICD-10-CM | POA: Diagnosis not present

## 2020-02-10 DIAGNOSIS — R2681 Unsteadiness on feet: Secondary | ICD-10-CM | POA: Diagnosis not present

## 2020-02-10 DIAGNOSIS — J449 Chronic obstructive pulmonary disease, unspecified: Secondary | ICD-10-CM | POA: Diagnosis not present

## 2020-02-11 DIAGNOSIS — I739 Peripheral vascular disease, unspecified: Secondary | ICD-10-CM | POA: Diagnosis not present

## 2020-02-11 DIAGNOSIS — E785 Hyperlipidemia, unspecified: Secondary | ICD-10-CM | POA: Diagnosis not present

## 2020-02-11 DIAGNOSIS — R52 Pain, unspecified: Secondary | ICD-10-CM | POA: Diagnosis not present

## 2020-02-11 DIAGNOSIS — R2681 Unsteadiness on feet: Secondary | ICD-10-CM | POA: Diagnosis not present

## 2020-02-11 DIAGNOSIS — K219 Gastro-esophageal reflux disease without esophagitis: Secondary | ICD-10-CM | POA: Diagnosis not present

## 2020-02-11 DIAGNOSIS — J449 Chronic obstructive pulmonary disease, unspecified: Secondary | ICD-10-CM | POA: Diagnosis not present

## 2020-02-11 DIAGNOSIS — J441 Chronic obstructive pulmonary disease with (acute) exacerbation: Secondary | ICD-10-CM | POA: Diagnosis not present

## 2020-02-11 DIAGNOSIS — E539 Vitamin B deficiency, unspecified: Secondary | ICD-10-CM | POA: Diagnosis not present

## 2020-02-11 DIAGNOSIS — I10 Essential (primary) hypertension: Secondary | ICD-10-CM | POA: Diagnosis not present

## 2020-02-11 DIAGNOSIS — I2582 Chronic total occlusion of coronary artery: Secondary | ICD-10-CM | POA: Diagnosis not present

## 2020-02-12 DIAGNOSIS — J441 Chronic obstructive pulmonary disease with (acute) exacerbation: Secondary | ICD-10-CM | POA: Diagnosis not present

## 2020-02-12 DIAGNOSIS — J449 Chronic obstructive pulmonary disease, unspecified: Secondary | ICD-10-CM | POA: Diagnosis not present

## 2020-02-12 DIAGNOSIS — I10 Essential (primary) hypertension: Secondary | ICD-10-CM | POA: Diagnosis not present

## 2020-02-12 DIAGNOSIS — E539 Vitamin B deficiency, unspecified: Secondary | ICD-10-CM | POA: Diagnosis not present

## 2020-02-12 DIAGNOSIS — R52 Pain, unspecified: Secondary | ICD-10-CM | POA: Diagnosis not present

## 2020-02-12 DIAGNOSIS — E785 Hyperlipidemia, unspecified: Secondary | ICD-10-CM | POA: Diagnosis not present

## 2020-02-12 DIAGNOSIS — I739 Peripheral vascular disease, unspecified: Secondary | ICD-10-CM | POA: Diagnosis not present

## 2020-02-12 DIAGNOSIS — R2681 Unsteadiness on feet: Secondary | ICD-10-CM | POA: Diagnosis not present

## 2020-02-12 DIAGNOSIS — K219 Gastro-esophageal reflux disease without esophagitis: Secondary | ICD-10-CM | POA: Diagnosis not present

## 2020-02-12 DIAGNOSIS — I2582 Chronic total occlusion of coronary artery: Secondary | ICD-10-CM | POA: Diagnosis not present

## 2020-02-15 DIAGNOSIS — J449 Chronic obstructive pulmonary disease, unspecified: Secondary | ICD-10-CM | POA: Diagnosis not present

## 2020-02-15 DIAGNOSIS — R52 Pain, unspecified: Secondary | ICD-10-CM | POA: Diagnosis not present

## 2020-02-15 DIAGNOSIS — I2582 Chronic total occlusion of coronary artery: Secondary | ICD-10-CM | POA: Diagnosis not present

## 2020-02-15 DIAGNOSIS — E785 Hyperlipidemia, unspecified: Secondary | ICD-10-CM | POA: Diagnosis not present

## 2020-02-15 DIAGNOSIS — R2681 Unsteadiness on feet: Secondary | ICD-10-CM | POA: Diagnosis not present

## 2020-02-15 DIAGNOSIS — K219 Gastro-esophageal reflux disease without esophagitis: Secondary | ICD-10-CM | POA: Diagnosis not present

## 2020-02-15 DIAGNOSIS — I739 Peripheral vascular disease, unspecified: Secondary | ICD-10-CM | POA: Diagnosis not present

## 2020-02-15 DIAGNOSIS — E539 Vitamin B deficiency, unspecified: Secondary | ICD-10-CM | POA: Diagnosis not present

## 2020-02-15 DIAGNOSIS — I10 Essential (primary) hypertension: Secondary | ICD-10-CM | POA: Diagnosis not present

## 2020-02-15 DIAGNOSIS — J441 Chronic obstructive pulmonary disease with (acute) exacerbation: Secondary | ICD-10-CM | POA: Diagnosis not present

## 2020-02-16 DIAGNOSIS — I2582 Chronic total occlusion of coronary artery: Secondary | ICD-10-CM | POA: Diagnosis not present

## 2020-02-16 DIAGNOSIS — R2681 Unsteadiness on feet: Secondary | ICD-10-CM | POA: Diagnosis not present

## 2020-02-16 DIAGNOSIS — R52 Pain, unspecified: Secondary | ICD-10-CM | POA: Diagnosis not present

## 2020-02-16 DIAGNOSIS — I739 Peripheral vascular disease, unspecified: Secondary | ICD-10-CM | POA: Diagnosis not present

## 2020-02-16 DIAGNOSIS — I10 Essential (primary) hypertension: Secondary | ICD-10-CM | POA: Diagnosis not present

## 2020-02-16 DIAGNOSIS — E539 Vitamin B deficiency, unspecified: Secondary | ICD-10-CM | POA: Diagnosis not present

## 2020-02-16 DIAGNOSIS — J449 Chronic obstructive pulmonary disease, unspecified: Secondary | ICD-10-CM | POA: Diagnosis not present

## 2020-02-16 DIAGNOSIS — K219 Gastro-esophageal reflux disease without esophagitis: Secondary | ICD-10-CM | POA: Diagnosis not present

## 2020-02-16 DIAGNOSIS — J441 Chronic obstructive pulmonary disease with (acute) exacerbation: Secondary | ICD-10-CM | POA: Diagnosis not present

## 2020-02-16 DIAGNOSIS — E785 Hyperlipidemia, unspecified: Secondary | ICD-10-CM | POA: Diagnosis not present

## 2020-02-17 DIAGNOSIS — J449 Chronic obstructive pulmonary disease, unspecified: Secondary | ICD-10-CM | POA: Diagnosis not present

## 2020-02-17 DIAGNOSIS — I739 Peripheral vascular disease, unspecified: Secondary | ICD-10-CM | POA: Diagnosis not present

## 2020-02-17 DIAGNOSIS — R2681 Unsteadiness on feet: Secondary | ICD-10-CM | POA: Diagnosis not present

## 2020-02-17 DIAGNOSIS — J441 Chronic obstructive pulmonary disease with (acute) exacerbation: Secondary | ICD-10-CM | POA: Diagnosis not present

## 2020-02-17 DIAGNOSIS — R52 Pain, unspecified: Secondary | ICD-10-CM | POA: Diagnosis not present

## 2020-02-17 DIAGNOSIS — E539 Vitamin B deficiency, unspecified: Secondary | ICD-10-CM | POA: Diagnosis not present

## 2020-02-17 DIAGNOSIS — I2582 Chronic total occlusion of coronary artery: Secondary | ICD-10-CM | POA: Diagnosis not present

## 2020-02-17 DIAGNOSIS — E785 Hyperlipidemia, unspecified: Secondary | ICD-10-CM | POA: Diagnosis not present

## 2020-02-17 DIAGNOSIS — I10 Essential (primary) hypertension: Secondary | ICD-10-CM | POA: Diagnosis not present

## 2020-02-17 DIAGNOSIS — K219 Gastro-esophageal reflux disease without esophagitis: Secondary | ICD-10-CM | POA: Diagnosis not present

## 2020-02-18 DIAGNOSIS — K219 Gastro-esophageal reflux disease without esophagitis: Secondary | ICD-10-CM | POA: Diagnosis not present

## 2020-02-18 DIAGNOSIS — R2681 Unsteadiness on feet: Secondary | ICD-10-CM | POA: Diagnosis not present

## 2020-02-18 DIAGNOSIS — I10 Essential (primary) hypertension: Secondary | ICD-10-CM | POA: Diagnosis not present

## 2020-02-18 DIAGNOSIS — J441 Chronic obstructive pulmonary disease with (acute) exacerbation: Secondary | ICD-10-CM | POA: Diagnosis not present

## 2020-02-18 DIAGNOSIS — I2582 Chronic total occlusion of coronary artery: Secondary | ICD-10-CM | POA: Diagnosis not present

## 2020-02-18 DIAGNOSIS — R52 Pain, unspecified: Secondary | ICD-10-CM | POA: Diagnosis not present

## 2020-02-18 DIAGNOSIS — J449 Chronic obstructive pulmonary disease, unspecified: Secondary | ICD-10-CM | POA: Diagnosis not present

## 2020-02-18 DIAGNOSIS — I739 Peripheral vascular disease, unspecified: Secondary | ICD-10-CM | POA: Diagnosis not present

## 2020-02-18 DIAGNOSIS — E785 Hyperlipidemia, unspecified: Secondary | ICD-10-CM | POA: Diagnosis not present

## 2020-02-18 DIAGNOSIS — E539 Vitamin B deficiency, unspecified: Secondary | ICD-10-CM | POA: Diagnosis not present

## 2020-02-19 DIAGNOSIS — I2582 Chronic total occlusion of coronary artery: Secondary | ICD-10-CM | POA: Diagnosis not present

## 2020-02-19 DIAGNOSIS — E785 Hyperlipidemia, unspecified: Secondary | ICD-10-CM | POA: Diagnosis not present

## 2020-02-19 DIAGNOSIS — J449 Chronic obstructive pulmonary disease, unspecified: Secondary | ICD-10-CM | POA: Diagnosis not present

## 2020-02-19 DIAGNOSIS — R52 Pain, unspecified: Secondary | ICD-10-CM | POA: Diagnosis not present

## 2020-02-19 DIAGNOSIS — E539 Vitamin B deficiency, unspecified: Secondary | ICD-10-CM | POA: Diagnosis not present

## 2020-02-19 DIAGNOSIS — I10 Essential (primary) hypertension: Secondary | ICD-10-CM | POA: Diagnosis not present

## 2020-02-19 DIAGNOSIS — I739 Peripheral vascular disease, unspecified: Secondary | ICD-10-CM | POA: Diagnosis not present

## 2020-02-19 DIAGNOSIS — K219 Gastro-esophageal reflux disease without esophagitis: Secondary | ICD-10-CM | POA: Diagnosis not present

## 2020-02-19 DIAGNOSIS — R2681 Unsteadiness on feet: Secondary | ICD-10-CM | POA: Diagnosis not present

## 2020-02-19 DIAGNOSIS — J441 Chronic obstructive pulmonary disease with (acute) exacerbation: Secondary | ICD-10-CM | POA: Diagnosis not present

## 2020-02-22 DIAGNOSIS — R2681 Unsteadiness on feet: Secondary | ICD-10-CM | POA: Diagnosis not present

## 2020-02-22 DIAGNOSIS — E785 Hyperlipidemia, unspecified: Secondary | ICD-10-CM | POA: Diagnosis not present

## 2020-02-22 DIAGNOSIS — R52 Pain, unspecified: Secondary | ICD-10-CM | POA: Diagnosis not present

## 2020-02-22 DIAGNOSIS — J449 Chronic obstructive pulmonary disease, unspecified: Secondary | ICD-10-CM | POA: Diagnosis not present

## 2020-02-22 DIAGNOSIS — E539 Vitamin B deficiency, unspecified: Secondary | ICD-10-CM | POA: Diagnosis not present

## 2020-02-22 DIAGNOSIS — I2582 Chronic total occlusion of coronary artery: Secondary | ICD-10-CM | POA: Diagnosis not present

## 2020-02-22 DIAGNOSIS — K219 Gastro-esophageal reflux disease without esophagitis: Secondary | ICD-10-CM | POA: Diagnosis not present

## 2020-02-22 DIAGNOSIS — I10 Essential (primary) hypertension: Secondary | ICD-10-CM | POA: Diagnosis not present

## 2020-02-22 DIAGNOSIS — J441 Chronic obstructive pulmonary disease with (acute) exacerbation: Secondary | ICD-10-CM | POA: Diagnosis not present

## 2020-02-22 DIAGNOSIS — I739 Peripheral vascular disease, unspecified: Secondary | ICD-10-CM | POA: Diagnosis not present

## 2020-02-23 DIAGNOSIS — I10 Essential (primary) hypertension: Secondary | ICD-10-CM | POA: Diagnosis not present

## 2020-02-23 DIAGNOSIS — R52 Pain, unspecified: Secondary | ICD-10-CM | POA: Diagnosis not present

## 2020-02-23 DIAGNOSIS — K219 Gastro-esophageal reflux disease without esophagitis: Secondary | ICD-10-CM | POA: Diagnosis not present

## 2020-02-23 DIAGNOSIS — I2582 Chronic total occlusion of coronary artery: Secondary | ICD-10-CM | POA: Diagnosis not present

## 2020-02-23 DIAGNOSIS — I739 Peripheral vascular disease, unspecified: Secondary | ICD-10-CM | POA: Diagnosis not present

## 2020-02-23 DIAGNOSIS — E785 Hyperlipidemia, unspecified: Secondary | ICD-10-CM | POA: Diagnosis not present

## 2020-02-23 DIAGNOSIS — E539 Vitamin B deficiency, unspecified: Secondary | ICD-10-CM | POA: Diagnosis not present

## 2020-02-23 DIAGNOSIS — R2681 Unsteadiness on feet: Secondary | ICD-10-CM | POA: Diagnosis not present

## 2020-02-23 DIAGNOSIS — J441 Chronic obstructive pulmonary disease with (acute) exacerbation: Secondary | ICD-10-CM | POA: Diagnosis not present

## 2020-02-23 DIAGNOSIS — J449 Chronic obstructive pulmonary disease, unspecified: Secondary | ICD-10-CM | POA: Diagnosis not present

## 2020-02-24 DIAGNOSIS — R52 Pain, unspecified: Secondary | ICD-10-CM | POA: Diagnosis not present

## 2020-02-24 DIAGNOSIS — E539 Vitamin B deficiency, unspecified: Secondary | ICD-10-CM | POA: Diagnosis not present

## 2020-02-24 DIAGNOSIS — I10 Essential (primary) hypertension: Secondary | ICD-10-CM | POA: Diagnosis not present

## 2020-02-24 DIAGNOSIS — I2582 Chronic total occlusion of coronary artery: Secondary | ICD-10-CM | POA: Diagnosis not present

## 2020-02-24 DIAGNOSIS — J441 Chronic obstructive pulmonary disease with (acute) exacerbation: Secondary | ICD-10-CM | POA: Diagnosis not present

## 2020-02-24 DIAGNOSIS — E785 Hyperlipidemia, unspecified: Secondary | ICD-10-CM | POA: Diagnosis not present

## 2020-02-24 DIAGNOSIS — J449 Chronic obstructive pulmonary disease, unspecified: Secondary | ICD-10-CM | POA: Diagnosis not present

## 2020-02-24 DIAGNOSIS — K219 Gastro-esophageal reflux disease without esophagitis: Secondary | ICD-10-CM | POA: Diagnosis not present

## 2020-02-24 DIAGNOSIS — R2681 Unsteadiness on feet: Secondary | ICD-10-CM | POA: Diagnosis not present

## 2020-02-24 DIAGNOSIS — I739 Peripheral vascular disease, unspecified: Secondary | ICD-10-CM | POA: Diagnosis not present

## 2020-02-25 DIAGNOSIS — E539 Vitamin B deficiency, unspecified: Secondary | ICD-10-CM | POA: Diagnosis not present

## 2020-02-25 DIAGNOSIS — R2681 Unsteadiness on feet: Secondary | ICD-10-CM | POA: Diagnosis not present

## 2020-02-25 DIAGNOSIS — I739 Peripheral vascular disease, unspecified: Secondary | ICD-10-CM | POA: Diagnosis not present

## 2020-02-25 DIAGNOSIS — E785 Hyperlipidemia, unspecified: Secondary | ICD-10-CM | POA: Diagnosis not present

## 2020-02-25 DIAGNOSIS — I2582 Chronic total occlusion of coronary artery: Secondary | ICD-10-CM | POA: Diagnosis not present

## 2020-02-25 DIAGNOSIS — I10 Essential (primary) hypertension: Secondary | ICD-10-CM | POA: Diagnosis not present

## 2020-02-25 DIAGNOSIS — K219 Gastro-esophageal reflux disease without esophagitis: Secondary | ICD-10-CM | POA: Diagnosis not present

## 2020-02-25 DIAGNOSIS — R52 Pain, unspecified: Secondary | ICD-10-CM | POA: Diagnosis not present

## 2020-02-25 DIAGNOSIS — J449 Chronic obstructive pulmonary disease, unspecified: Secondary | ICD-10-CM | POA: Diagnosis not present

## 2020-02-25 DIAGNOSIS — J441 Chronic obstructive pulmonary disease with (acute) exacerbation: Secondary | ICD-10-CM | POA: Diagnosis not present

## 2020-02-26 DIAGNOSIS — J441 Chronic obstructive pulmonary disease with (acute) exacerbation: Secondary | ICD-10-CM | POA: Diagnosis not present

## 2020-02-26 DIAGNOSIS — E539 Vitamin B deficiency, unspecified: Secondary | ICD-10-CM | POA: Diagnosis not present

## 2020-02-26 DIAGNOSIS — R2681 Unsteadiness on feet: Secondary | ICD-10-CM | POA: Diagnosis not present

## 2020-02-26 DIAGNOSIS — I2582 Chronic total occlusion of coronary artery: Secondary | ICD-10-CM | POA: Diagnosis not present

## 2020-02-26 DIAGNOSIS — R52 Pain, unspecified: Secondary | ICD-10-CM | POA: Diagnosis not present

## 2020-02-26 DIAGNOSIS — K219 Gastro-esophageal reflux disease without esophagitis: Secondary | ICD-10-CM | POA: Diagnosis not present

## 2020-02-26 DIAGNOSIS — E785 Hyperlipidemia, unspecified: Secondary | ICD-10-CM | POA: Diagnosis not present

## 2020-02-26 DIAGNOSIS — I10 Essential (primary) hypertension: Secondary | ICD-10-CM | POA: Diagnosis not present

## 2020-02-26 DIAGNOSIS — I739 Peripheral vascular disease, unspecified: Secondary | ICD-10-CM | POA: Diagnosis not present

## 2020-02-26 DIAGNOSIS — J449 Chronic obstructive pulmonary disease, unspecified: Secondary | ICD-10-CM | POA: Diagnosis not present

## 2020-02-29 DIAGNOSIS — E539 Vitamin B deficiency, unspecified: Secondary | ICD-10-CM | POA: Diagnosis not present

## 2020-02-29 DIAGNOSIS — E559 Vitamin D deficiency, unspecified: Secondary | ICD-10-CM | POA: Diagnosis not present

## 2020-02-29 DIAGNOSIS — K219 Gastro-esophageal reflux disease without esophagitis: Secondary | ICD-10-CM | POA: Diagnosis not present

## 2020-02-29 DIAGNOSIS — I10 Essential (primary) hypertension: Secondary | ICD-10-CM | POA: Diagnosis not present

## 2020-02-29 DIAGNOSIS — I739 Peripheral vascular disease, unspecified: Secondary | ICD-10-CM | POA: Diagnosis not present

## 2020-02-29 DIAGNOSIS — J441 Chronic obstructive pulmonary disease with (acute) exacerbation: Secondary | ICD-10-CM | POA: Diagnosis not present

## 2020-02-29 DIAGNOSIS — R2681 Unsteadiness on feet: Secondary | ICD-10-CM | POA: Diagnosis not present

## 2020-02-29 DIAGNOSIS — I2582 Chronic total occlusion of coronary artery: Secondary | ICD-10-CM | POA: Diagnosis not present

## 2020-02-29 DIAGNOSIS — E785 Hyperlipidemia, unspecified: Secondary | ICD-10-CM | POA: Diagnosis not present

## 2020-02-29 DIAGNOSIS — I5022 Chronic systolic (congestive) heart failure: Secondary | ICD-10-CM | POA: Diagnosis not present

## 2020-02-29 DIAGNOSIS — R52 Pain, unspecified: Secondary | ICD-10-CM | POA: Diagnosis not present

## 2020-02-29 DIAGNOSIS — J449 Chronic obstructive pulmonary disease, unspecified: Secondary | ICD-10-CM | POA: Diagnosis not present

## 2020-03-01 DIAGNOSIS — J441 Chronic obstructive pulmonary disease with (acute) exacerbation: Secondary | ICD-10-CM | POA: Diagnosis not present

## 2020-03-01 DIAGNOSIS — E785 Hyperlipidemia, unspecified: Secondary | ICD-10-CM | POA: Diagnosis not present

## 2020-03-01 DIAGNOSIS — E539 Vitamin B deficiency, unspecified: Secondary | ICD-10-CM | POA: Diagnosis not present

## 2020-03-01 DIAGNOSIS — I10 Essential (primary) hypertension: Secondary | ICD-10-CM | POA: Diagnosis not present

## 2020-03-01 DIAGNOSIS — J449 Chronic obstructive pulmonary disease, unspecified: Secondary | ICD-10-CM | POA: Diagnosis not present

## 2020-03-01 DIAGNOSIS — R52 Pain, unspecified: Secondary | ICD-10-CM | POA: Diagnosis not present

## 2020-03-01 DIAGNOSIS — I2582 Chronic total occlusion of coronary artery: Secondary | ICD-10-CM | POA: Diagnosis not present

## 2020-03-01 DIAGNOSIS — R2681 Unsteadiness on feet: Secondary | ICD-10-CM | POA: Diagnosis not present

## 2020-03-01 DIAGNOSIS — K219 Gastro-esophageal reflux disease without esophagitis: Secondary | ICD-10-CM | POA: Diagnosis not present

## 2020-03-01 DIAGNOSIS — I739 Peripheral vascular disease, unspecified: Secondary | ICD-10-CM | POA: Diagnosis not present

## 2020-03-02 DIAGNOSIS — E539 Vitamin B deficiency, unspecified: Secondary | ICD-10-CM | POA: Diagnosis not present

## 2020-03-02 DIAGNOSIS — I10 Essential (primary) hypertension: Secondary | ICD-10-CM | POA: Diagnosis not present

## 2020-03-02 DIAGNOSIS — J449 Chronic obstructive pulmonary disease, unspecified: Secondary | ICD-10-CM | POA: Diagnosis not present

## 2020-03-02 DIAGNOSIS — E785 Hyperlipidemia, unspecified: Secondary | ICD-10-CM | POA: Diagnosis not present

## 2020-03-02 DIAGNOSIS — I2582 Chronic total occlusion of coronary artery: Secondary | ICD-10-CM | POA: Diagnosis not present

## 2020-03-02 DIAGNOSIS — R52 Pain, unspecified: Secondary | ICD-10-CM | POA: Diagnosis not present

## 2020-03-02 DIAGNOSIS — K219 Gastro-esophageal reflux disease without esophagitis: Secondary | ICD-10-CM | POA: Diagnosis not present

## 2020-03-02 DIAGNOSIS — J441 Chronic obstructive pulmonary disease with (acute) exacerbation: Secondary | ICD-10-CM | POA: Diagnosis not present

## 2020-03-02 DIAGNOSIS — I739 Peripheral vascular disease, unspecified: Secondary | ICD-10-CM | POA: Diagnosis not present

## 2020-03-02 DIAGNOSIS — R2681 Unsteadiness on feet: Secondary | ICD-10-CM | POA: Diagnosis not present

## 2020-03-03 DIAGNOSIS — J449 Chronic obstructive pulmonary disease, unspecified: Secondary | ICD-10-CM | POA: Diagnosis not present

## 2020-03-03 DIAGNOSIS — E785 Hyperlipidemia, unspecified: Secondary | ICD-10-CM | POA: Diagnosis not present

## 2020-03-03 DIAGNOSIS — I2582 Chronic total occlusion of coronary artery: Secondary | ICD-10-CM | POA: Diagnosis not present

## 2020-03-03 DIAGNOSIS — I10 Essential (primary) hypertension: Secondary | ICD-10-CM | POA: Diagnosis not present

## 2020-03-03 DIAGNOSIS — R52 Pain, unspecified: Secondary | ICD-10-CM | POA: Diagnosis not present

## 2020-03-03 DIAGNOSIS — R2681 Unsteadiness on feet: Secondary | ICD-10-CM | POA: Diagnosis not present

## 2020-03-03 DIAGNOSIS — J441 Chronic obstructive pulmonary disease with (acute) exacerbation: Secondary | ICD-10-CM | POA: Diagnosis not present

## 2020-03-03 DIAGNOSIS — E539 Vitamin B deficiency, unspecified: Secondary | ICD-10-CM | POA: Diagnosis not present

## 2020-03-03 DIAGNOSIS — I739 Peripheral vascular disease, unspecified: Secondary | ICD-10-CM | POA: Diagnosis not present

## 2020-03-03 DIAGNOSIS — K219 Gastro-esophageal reflux disease without esophagitis: Secondary | ICD-10-CM | POA: Diagnosis not present

## 2020-03-04 DIAGNOSIS — I10 Essential (primary) hypertension: Secondary | ICD-10-CM | POA: Diagnosis not present

## 2020-03-04 DIAGNOSIS — I2582 Chronic total occlusion of coronary artery: Secondary | ICD-10-CM | POA: Diagnosis not present

## 2020-03-04 DIAGNOSIS — R2681 Unsteadiness on feet: Secondary | ICD-10-CM | POA: Diagnosis not present

## 2020-03-04 DIAGNOSIS — J441 Chronic obstructive pulmonary disease with (acute) exacerbation: Secondary | ICD-10-CM | POA: Diagnosis not present

## 2020-03-04 DIAGNOSIS — E539 Vitamin B deficiency, unspecified: Secondary | ICD-10-CM | POA: Diagnosis not present

## 2020-03-04 DIAGNOSIS — E785 Hyperlipidemia, unspecified: Secondary | ICD-10-CM | POA: Diagnosis not present

## 2020-03-04 DIAGNOSIS — J449 Chronic obstructive pulmonary disease, unspecified: Secondary | ICD-10-CM | POA: Diagnosis not present

## 2020-03-04 DIAGNOSIS — I739 Peripheral vascular disease, unspecified: Secondary | ICD-10-CM | POA: Diagnosis not present

## 2020-03-04 DIAGNOSIS — R52 Pain, unspecified: Secondary | ICD-10-CM | POA: Diagnosis not present

## 2020-03-04 DIAGNOSIS — K219 Gastro-esophageal reflux disease without esophagitis: Secondary | ICD-10-CM | POA: Diagnosis not present

## 2020-03-07 DIAGNOSIS — R2681 Unsteadiness on feet: Secondary | ICD-10-CM | POA: Diagnosis not present

## 2020-03-07 DIAGNOSIS — I739 Peripheral vascular disease, unspecified: Secondary | ICD-10-CM | POA: Diagnosis not present

## 2020-03-07 DIAGNOSIS — E539 Vitamin B deficiency, unspecified: Secondary | ICD-10-CM | POA: Diagnosis not present

## 2020-03-07 DIAGNOSIS — J449 Chronic obstructive pulmonary disease, unspecified: Secondary | ICD-10-CM | POA: Diagnosis not present

## 2020-03-07 DIAGNOSIS — K219 Gastro-esophageal reflux disease without esophagitis: Secondary | ICD-10-CM | POA: Diagnosis not present

## 2020-03-07 DIAGNOSIS — E785 Hyperlipidemia, unspecified: Secondary | ICD-10-CM | POA: Diagnosis not present

## 2020-03-07 DIAGNOSIS — R52 Pain, unspecified: Secondary | ICD-10-CM | POA: Diagnosis not present

## 2020-03-07 DIAGNOSIS — J441 Chronic obstructive pulmonary disease with (acute) exacerbation: Secondary | ICD-10-CM | POA: Diagnosis not present

## 2020-03-07 DIAGNOSIS — I2582 Chronic total occlusion of coronary artery: Secondary | ICD-10-CM | POA: Diagnosis not present

## 2020-03-07 DIAGNOSIS — I10 Essential (primary) hypertension: Secondary | ICD-10-CM | POA: Diagnosis not present

## 2020-03-08 DIAGNOSIS — E539 Vitamin B deficiency, unspecified: Secondary | ICD-10-CM | POA: Diagnosis not present

## 2020-03-08 DIAGNOSIS — R2681 Unsteadiness on feet: Secondary | ICD-10-CM | POA: Diagnosis not present

## 2020-03-08 DIAGNOSIS — E785 Hyperlipidemia, unspecified: Secondary | ICD-10-CM | POA: Diagnosis not present

## 2020-03-08 DIAGNOSIS — I739 Peripheral vascular disease, unspecified: Secondary | ICD-10-CM | POA: Diagnosis not present

## 2020-03-08 DIAGNOSIS — R52 Pain, unspecified: Secondary | ICD-10-CM | POA: Diagnosis not present

## 2020-03-08 DIAGNOSIS — J441 Chronic obstructive pulmonary disease with (acute) exacerbation: Secondary | ICD-10-CM | POA: Diagnosis not present

## 2020-03-08 DIAGNOSIS — K219 Gastro-esophageal reflux disease without esophagitis: Secondary | ICD-10-CM | POA: Diagnosis not present

## 2020-03-08 DIAGNOSIS — I2582 Chronic total occlusion of coronary artery: Secondary | ICD-10-CM | POA: Diagnosis not present

## 2020-03-08 DIAGNOSIS — I10 Essential (primary) hypertension: Secondary | ICD-10-CM | POA: Diagnosis not present

## 2020-03-09 DIAGNOSIS — J441 Chronic obstructive pulmonary disease with (acute) exacerbation: Secondary | ICD-10-CM | POA: Diagnosis not present

## 2020-03-09 DIAGNOSIS — I739 Peripheral vascular disease, unspecified: Secondary | ICD-10-CM | POA: Diagnosis not present

## 2020-03-09 DIAGNOSIS — R52 Pain, unspecified: Secondary | ICD-10-CM | POA: Diagnosis not present

## 2020-03-09 DIAGNOSIS — E785 Hyperlipidemia, unspecified: Secondary | ICD-10-CM | POA: Diagnosis not present

## 2020-03-09 DIAGNOSIS — K219 Gastro-esophageal reflux disease without esophagitis: Secondary | ICD-10-CM | POA: Diagnosis not present

## 2020-03-09 DIAGNOSIS — I2582 Chronic total occlusion of coronary artery: Secondary | ICD-10-CM | POA: Diagnosis not present

## 2020-03-09 DIAGNOSIS — E539 Vitamin B deficiency, unspecified: Secondary | ICD-10-CM | POA: Diagnosis not present

## 2020-03-09 DIAGNOSIS — I10 Essential (primary) hypertension: Secondary | ICD-10-CM | POA: Diagnosis not present

## 2020-03-09 DIAGNOSIS — R2681 Unsteadiness on feet: Secondary | ICD-10-CM | POA: Diagnosis not present

## 2020-03-10 DIAGNOSIS — R52 Pain, unspecified: Secondary | ICD-10-CM | POA: Diagnosis not present

## 2020-03-10 DIAGNOSIS — R2681 Unsteadiness on feet: Secondary | ICD-10-CM | POA: Diagnosis not present

## 2020-03-10 DIAGNOSIS — I10 Essential (primary) hypertension: Secondary | ICD-10-CM | POA: Diagnosis not present

## 2020-03-10 DIAGNOSIS — I2582 Chronic total occlusion of coronary artery: Secondary | ICD-10-CM | POA: Diagnosis not present

## 2020-03-10 DIAGNOSIS — E785 Hyperlipidemia, unspecified: Secondary | ICD-10-CM | POA: Diagnosis not present

## 2020-03-10 DIAGNOSIS — J441 Chronic obstructive pulmonary disease with (acute) exacerbation: Secondary | ICD-10-CM | POA: Diagnosis not present

## 2020-03-10 DIAGNOSIS — I739 Peripheral vascular disease, unspecified: Secondary | ICD-10-CM | POA: Diagnosis not present

## 2020-03-10 DIAGNOSIS — E539 Vitamin B deficiency, unspecified: Secondary | ICD-10-CM | POA: Diagnosis not present

## 2020-03-10 DIAGNOSIS — K219 Gastro-esophageal reflux disease without esophagitis: Secondary | ICD-10-CM | POA: Diagnosis not present

## 2020-03-11 DIAGNOSIS — I10 Essential (primary) hypertension: Secondary | ICD-10-CM | POA: Diagnosis not present

## 2020-03-11 DIAGNOSIS — E785 Hyperlipidemia, unspecified: Secondary | ICD-10-CM | POA: Diagnosis not present

## 2020-03-11 DIAGNOSIS — J441 Chronic obstructive pulmonary disease with (acute) exacerbation: Secondary | ICD-10-CM | POA: Diagnosis not present

## 2020-03-11 DIAGNOSIS — R2681 Unsteadiness on feet: Secondary | ICD-10-CM | POA: Diagnosis not present

## 2020-03-11 DIAGNOSIS — E539 Vitamin B deficiency, unspecified: Secondary | ICD-10-CM | POA: Diagnosis not present

## 2020-03-11 DIAGNOSIS — K219 Gastro-esophageal reflux disease without esophagitis: Secondary | ICD-10-CM | POA: Diagnosis not present

## 2020-03-11 DIAGNOSIS — I739 Peripheral vascular disease, unspecified: Secondary | ICD-10-CM | POA: Diagnosis not present

## 2020-03-11 DIAGNOSIS — R52 Pain, unspecified: Secondary | ICD-10-CM | POA: Diagnosis not present

## 2020-03-11 DIAGNOSIS — I2582 Chronic total occlusion of coronary artery: Secondary | ICD-10-CM | POA: Diagnosis not present

## 2020-03-15 DIAGNOSIS — J449 Chronic obstructive pulmonary disease, unspecified: Secondary | ICD-10-CM | POA: Diagnosis not present

## 2020-03-21 DIAGNOSIS — H25813 Combined forms of age-related cataract, bilateral: Secondary | ICD-10-CM | POA: Diagnosis not present

## 2020-03-21 DIAGNOSIS — Z7952 Long term (current) use of systemic steroids: Secondary | ICD-10-CM | POA: Diagnosis not present

## 2020-03-21 DIAGNOSIS — H04123 Dry eye syndrome of bilateral lacrimal glands: Secondary | ICD-10-CM | POA: Diagnosis not present

## 2020-03-21 DIAGNOSIS — Z7951 Long term (current) use of inhaled steroids: Secondary | ICD-10-CM | POA: Diagnosis not present

## 2020-03-21 DIAGNOSIS — H524 Presbyopia: Secondary | ICD-10-CM | POA: Diagnosis not present

## 2020-03-30 ENCOUNTER — Non-Acute Institutional Stay: Payer: Medicare Other | Admitting: Primary Care

## 2020-03-30 ENCOUNTER — Other Ambulatory Visit: Payer: Self-pay

## 2020-03-30 DIAGNOSIS — I739 Peripheral vascular disease, unspecified: Secondary | ICD-10-CM

## 2020-03-30 DIAGNOSIS — Z515 Encounter for palliative care: Secondary | ICD-10-CM

## 2020-03-30 NOTE — Progress Notes (Signed)
[facility] Consult Note Telephone: [phone]  Fax: [phone]    Date of encounter: 03/30/20 PATIENT NAME: Anthony Chambers [address] [place] [address]   [phone] (home)  DOB: [birth year] MRN: [id number]  PRIMARY CARE PROVIDER:    Jennette Bill., MD,  [address] [place] [address] [phone]  REFERRING PROVIDER:   Jennette Bill., MD [address] [place],  [place] [address] [phone]  RESPONSIBLE PARTY:    Contact Information    Name Relation Home Work Mobile   [place] Sister [phone] [place], [place] Relative   [phone]   Ottie Glazier [phone]     Rona Ravens [phone]       I met face to face with patient  In facility. Palliative Care was asked to follow this patient by consultation request of  Hester, Anthony Chambers., MD to address advance care planning and complex medical decision making. This is the follow up visit.     ASSESSMENT AND RECOMMENDATIONS:   1. Advance Care Planning/Goals of Care: Goals include to maximize quality of life and symptom management. Full code, no change in  POA directives.  2. Symptom Management:   Patient sitting up in his room, awaiting lunch. He endorses a good appetite and intake. He states he is in good spirits and has no pain. He states he does not often see his sister, but he can call if needed.  He states he uses his oxygen constantly at 4 l / [place]. He has it in place today . He states he does not use his CPAP not does he want to as he states it is very uncomfortable. Recent pulmonary note suggests assessing for bipap. I am not sure if he will do better with that but perhaps he can tolerate it better. He may also need to try nose pillows or nasal mask. Staff would also need to assist him with connecting his oxygen to the device each night.  Pt points out a pretibial ulcer. This is dressed and being  cared for by staff.   Patient states he enjoys his stuffed Holiday representative. He recently won a Engineer, drilling toy at a [employer] and was very happy to show off this treasure.  3. Follow up Palliative Care Visit: Palliative care will continue to follow for goals of care clarification and symptom management. Return 12 weeks or prn.  4. Family /Caregiver/Community Supports: Sister is POA, pt lives in [place]  5. Cognitive / Functional decline: A and O x 2, dependent in all adls, and iadls.  I spent 25 minutes providing this consultation,  from 1100 to 1125.  More than 50% of the time in this consultation was spent in counseling and care coordination.  CODE STATUS: FULL CODE PPS: 40%  HOSPICE ELIGIBILITY/DIAGNOSIS: no  CHIEF COMPLAINT: dyspnea  HISTORY OF PRESENT ILLNESS:  Anthony Chambers is a 75 y.o. year old male  with h/o copd, immobility, oxygen dependence. He states he is comfortable with oxygen during the day. He does not want to use CPAP at hs and has refused due to discomfort. Dyspnea is at baseline with current oxygen tx.   Review and summarization of old Epic records shows or history from other than patient, including family members.   History obtained from review of EMR, discussion with primary team, and  interview with family, caregiver  and/or Mr. Lalli. Records reviewed and summarized above.  CURRENT PROBLEM LIST:  Patient Active Problem List   Diagnosis Date Noted  . DVT (deep venous thrombosis) ([facility]) 10/20/2019  . GERD (gastroesophageal reflux disease) 10/20/2019  . Hyperlipidemia 10/20/2019  . Acute respiratory failure with hypoxia and hypercapnia ([facility]) 06/01/2018  . PVD (peripheral vascular disease) ([facility]) 08/15/2012   PAST MEDICAL HISTORY:  Active Ambulatory Problems    Diagnosis Date Noted  . Acute respiratory failure with hypoxia and hypercapnia ([facility]) 06/01/2018  . DVT (deep venous thrombosis) ([facility]) 10/20/2019  . GERD (gastroesophageal reflux disease) 10/20/2019   . Hyperlipidemia 10/20/2019  . PVD (peripheral vascular disease) ([facility]) 08/15/2012   Resolved Ambulatory Problems    Diagnosis Date Noted  . No Resolved Ambulatory Problems   Past Medical History:  Diagnosis Date  . Depression   . Hypertension   . Schizophrenia ([facility])   . Total occlusion of coronary artery, chronic    SOCIAL HX:  Social History   Tobacco Use  . Smoking status: Former Smoker    Years: 40.00    Types: Cigarettes  . Smokeless tobacco: Never Used  Substance Use Topics  . Alcohol use: Not Currently   FAMILY HX:  Family History  Problem Relation Age of Onset  . Asthma Mother       ALLERGIES:  Allergies  Allergen Reactions  . Heparin      PERTINENT MEDICATIONS:  Outpatient Encounter Medications as of 03/30/2020  Medication Sig  . acetaminophen (TYLENOL) 325 MG tablet Take by mouth every 4 (four) hours as needed for mild pain or moderate pain.  Marland Kitchen acetaminophen (TYLENOL) 500 MG tablet Take 1,000 mg by mouth at bedtime.  Marland Kitchen albuterol (VENTOLIN HFA) 108 (90 Base) MCG/ACT inhaler Inhale 2 puffs into the lungs every 4 (four) hours as needed for wheezing or shortness of breath.  . furosemide (LASIX) 40 MG tablet Take 40 mg by mouth daily.  Marland Kitchen guaifenesin (ROBITUSSIN) 100 MG/5ML syrup Take 200 mg by mouth every 6 (six) hours as needed for cough.  . Ipratropium-Albuterol (COMBIVENT RESPIMAT) 20-100 MCG/ACT AERS respimat Inhale 1 puff into the lungs in the morning, at noon, in the evening, and at bedtime.  . Melatonin 5 MG TABS Take 5 mg by mouth at bedtime.   . metoprolol succinate (TOPROL-XL) 25 MG 24 hr tablet Take 25 mg by mouth daily.  . Multiple Vitamin (MULTIVITAMIN WITH MINERALS) TABS tablet Take 1 tablet by mouth daily.  Marland Kitchen OLANZapine (ZYPREXA) 15 MG tablet Take 30 mg by mouth daily.  Marland Kitchen omeprazole (PRILOSEC) 20 MG capsule Take 20 mg by mouth daily.  . OXYGEN Inhale 4 L/min into the lungs continuous.  Marland Kitchen PARoxetine (PAXIL-CR) 25 MG 24 hr tablet Take 25 mg by  mouth daily.  . risperiDONE (RISPERDAL) 1 MG tablet Take 1 mg by mouth 2 (two) times daily.   . rivaroxaban (XARELTO) 20 MG TABS tablet Take 20 mg by mouth daily.  Marland Kitchen saccharomyces boulardii (FLORASTOR) 250 MG capsule Take 250 mg by mouth 2 (two) times daily.  . sennosides-docusate sodium (SENOKOT-S) 8.6-50 MG tablet Take 1 tablet by mouth in the morning and at bedtime.  . simvastatin (ZOCOR) 20 MG tablet Take 20 mg by mouth at bedtime.  . tamsulosin (FLOMAX) 0.4 MG CAPS capsule Take 1 capsule (0.4 mg total) by mouth daily after supper.  . triamcinolone cream (KENALOG) 0.5 % Apply 1 application topically 2 (two) times daily.   No facility-administered encounter medications on file as of 03/30/2020.     ROS  General: NAD Pulmonary: denies  cough, denies increased SOB Abdomen: endorses  good appetite, denies constipation, endorses continence of bowel GU: denies dysuria, endorses continence of urine MSK:  endorses ROM limitations, no falls reported Skin: R pretibial ulcer, covered Neurological: endorses weakness, denies pain, denies insomnia Psych: Endorses positive mood   Physical Exam: Current and past weights:subjectively stable Constitutional:  NAD General: frail appearing, thin/WNWD/obese  CV: S1S2, RRR, no LE edema Pulmonary:  Lungs with diminished air flow, no increased work of breathing, no cough, no audible wheezes, oxygen at 4 l / [place] Abdomen: intake 100%, no ascites GU: deferred MSK: mild sarcopenia, decreased ROM in all extremities, no contractures of LE, non ambulatory Skin: R pretibial ulcer, covered Neuro: Generalized weakness, ++ cognitive impairment Psych: non-anxious affect, A and O x 2  Thank you for the opportunity to participate in the care of Mr. Gregson.  The palliative care team will continue to follow. Please call our office at [phone] if we can be of additional assistance.    Jason Coop, NP , DNP, MPH, AGPCNP-BC, ACHPN   COVID-19 PATIENT  SCREENING TOOL  Person answering questions: _______staff____________   1.  Is the patient or any family member in the home showing any signs or symptoms regarding respiratory infection?                  Person with Symptom  ______________na___________ a. Fever/chills/headache                                                        Yes___ No__X_            b. Shortness of breath                                                            Yes___ No__X_           c. Cough/congestion                                               Yes___  No__X_          d. Muscle/Body aches/pains                                                   Yes___ No__X_         e. Gastrointestinal symptoms (diarrhea,nausea)             Yes___ No__X_         f. Sudden loss of smell or taste      Yes___ No__X_        2. Within the past 10 days, has anyone living in the home had any contact with someone with or under investigation for COVID-19?    Yes___ No__X__   Person __________________

## 2020-04-26 DIAGNOSIS — I890 Lymphedema, not elsewhere classified: Secondary | ICD-10-CM | POA: Diagnosis not present

## 2020-04-26 DIAGNOSIS — L603 Nail dystrophy: Secondary | ICD-10-CM | POA: Diagnosis not present

## 2020-04-26 DIAGNOSIS — I739 Peripheral vascular disease, unspecified: Secondary | ICD-10-CM | POA: Diagnosis not present

## 2020-05-04 DIAGNOSIS — Z86718 Personal history of other venous thrombosis and embolism: Secondary | ICD-10-CM | POA: Diagnosis not present

## 2020-05-04 DIAGNOSIS — G4700 Insomnia, unspecified: Secondary | ICD-10-CM | POA: Diagnosis not present

## 2020-05-04 DIAGNOSIS — G8929 Other chronic pain: Secondary | ICD-10-CM | POA: Diagnosis not present

## 2020-05-04 DIAGNOSIS — E559 Vitamin D deficiency, unspecified: Secondary | ICD-10-CM | POA: Diagnosis not present

## 2020-05-04 DIAGNOSIS — I5022 Chronic systolic (congestive) heart failure: Secondary | ICD-10-CM | POA: Diagnosis not present

## 2020-05-08 DIAGNOSIS — R799 Abnormal finding of blood chemistry, unspecified: Secondary | ICD-10-CM | POA: Diagnosis not present

## 2020-05-09 DIAGNOSIS — I5022 Chronic systolic (congestive) heart failure: Secondary | ICD-10-CM | POA: Diagnosis not present

## 2020-05-09 DIAGNOSIS — N179 Acute kidney failure, unspecified: Secondary | ICD-10-CM | POA: Diagnosis not present

## 2020-05-09 DIAGNOSIS — E559 Vitamin D deficiency, unspecified: Secondary | ICD-10-CM | POA: Diagnosis not present

## 2020-05-09 DIAGNOSIS — G4700 Insomnia, unspecified: Secondary | ICD-10-CM | POA: Diagnosis not present

## 2020-05-11 DIAGNOSIS — E875 Hyperkalemia: Secondary | ICD-10-CM | POA: Diagnosis not present

## 2020-05-11 DIAGNOSIS — I5022 Chronic systolic (congestive) heart failure: Secondary | ICD-10-CM | POA: Diagnosis not present

## 2020-05-11 DIAGNOSIS — I10 Essential (primary) hypertension: Secondary | ICD-10-CM | POA: Diagnosis not present

## 2020-05-11 DIAGNOSIS — N179 Acute kidney failure, unspecified: Secondary | ICD-10-CM | POA: Diagnosis not present

## 2020-05-11 DIAGNOSIS — G4700 Insomnia, unspecified: Secondary | ICD-10-CM | POA: Diagnosis not present

## 2020-05-18 DIAGNOSIS — I10 Essential (primary) hypertension: Secondary | ICD-10-CM | POA: Diagnosis not present

## 2020-05-31 DIAGNOSIS — R1312 Dysphagia, oropharyngeal phase: Secondary | ICD-10-CM | POA: Diagnosis not present

## 2020-05-31 DIAGNOSIS — I739 Peripheral vascular disease, unspecified: Secondary | ICD-10-CM | POA: Diagnosis not present

## 2020-05-31 DIAGNOSIS — E785 Hyperlipidemia, unspecified: Secondary | ICD-10-CM | POA: Diagnosis not present

## 2020-05-31 DIAGNOSIS — R52 Pain, unspecified: Secondary | ICD-10-CM | POA: Diagnosis not present

## 2020-05-31 DIAGNOSIS — K219 Gastro-esophageal reflux disease without esophagitis: Secondary | ICD-10-CM | POA: Diagnosis not present

## 2020-05-31 DIAGNOSIS — I2582 Chronic total occlusion of coronary artery: Secondary | ICD-10-CM | POA: Diagnosis not present

## 2020-05-31 DIAGNOSIS — R0902 Hypoxemia: Secondary | ICD-10-CM | POA: Diagnosis not present

## 2020-05-31 DIAGNOSIS — I10 Essential (primary) hypertension: Secondary | ICD-10-CM | POA: Diagnosis not present

## 2020-05-31 DIAGNOSIS — E539 Vitamin B deficiency, unspecified: Secondary | ICD-10-CM | POA: Diagnosis not present

## 2020-05-31 DIAGNOSIS — J441 Chronic obstructive pulmonary disease with (acute) exacerbation: Secondary | ICD-10-CM | POA: Diagnosis not present

## 2020-06-01 DIAGNOSIS — R52 Pain, unspecified: Secondary | ICD-10-CM | POA: Diagnosis not present

## 2020-06-01 DIAGNOSIS — K219 Gastro-esophageal reflux disease without esophagitis: Secondary | ICD-10-CM | POA: Diagnosis not present

## 2020-06-01 DIAGNOSIS — E539 Vitamin B deficiency, unspecified: Secondary | ICD-10-CM | POA: Diagnosis not present

## 2020-06-01 DIAGNOSIS — I739 Peripheral vascular disease, unspecified: Secondary | ICD-10-CM | POA: Diagnosis not present

## 2020-06-01 DIAGNOSIS — R1312 Dysphagia, oropharyngeal phase: Secondary | ICD-10-CM | POA: Diagnosis not present

## 2020-06-01 DIAGNOSIS — I10 Essential (primary) hypertension: Secondary | ICD-10-CM | POA: Diagnosis not present

## 2020-06-01 DIAGNOSIS — J441 Chronic obstructive pulmonary disease with (acute) exacerbation: Secondary | ICD-10-CM | POA: Diagnosis not present

## 2020-06-01 DIAGNOSIS — R0902 Hypoxemia: Secondary | ICD-10-CM | POA: Diagnosis not present

## 2020-06-01 DIAGNOSIS — E785 Hyperlipidemia, unspecified: Secondary | ICD-10-CM | POA: Diagnosis not present

## 2020-06-01 DIAGNOSIS — I2582 Chronic total occlusion of coronary artery: Secondary | ICD-10-CM | POA: Diagnosis not present

## 2020-06-02 DIAGNOSIS — I739 Peripheral vascular disease, unspecified: Secondary | ICD-10-CM | POA: Diagnosis not present

## 2020-06-02 DIAGNOSIS — R52 Pain, unspecified: Secondary | ICD-10-CM | POA: Diagnosis not present

## 2020-06-02 DIAGNOSIS — E539 Vitamin B deficiency, unspecified: Secondary | ICD-10-CM | POA: Diagnosis not present

## 2020-06-02 DIAGNOSIS — E785 Hyperlipidemia, unspecified: Secondary | ICD-10-CM | POA: Diagnosis not present

## 2020-06-02 DIAGNOSIS — R0902 Hypoxemia: Secondary | ICD-10-CM | POA: Diagnosis not present

## 2020-06-02 DIAGNOSIS — J441 Chronic obstructive pulmonary disease with (acute) exacerbation: Secondary | ICD-10-CM | POA: Diagnosis not present

## 2020-06-02 DIAGNOSIS — I2582 Chronic total occlusion of coronary artery: Secondary | ICD-10-CM | POA: Diagnosis not present

## 2020-06-02 DIAGNOSIS — I10 Essential (primary) hypertension: Secondary | ICD-10-CM | POA: Diagnosis not present

## 2020-06-02 DIAGNOSIS — K219 Gastro-esophageal reflux disease without esophagitis: Secondary | ICD-10-CM | POA: Diagnosis not present

## 2020-06-02 DIAGNOSIS — R1312 Dysphagia, oropharyngeal phase: Secondary | ICD-10-CM | POA: Diagnosis not present

## 2020-06-03 DIAGNOSIS — I10 Essential (primary) hypertension: Secondary | ICD-10-CM | POA: Diagnosis not present

## 2020-06-03 DIAGNOSIS — E785 Hyperlipidemia, unspecified: Secondary | ICD-10-CM | POA: Diagnosis not present

## 2020-06-03 DIAGNOSIS — R0902 Hypoxemia: Secondary | ICD-10-CM | POA: Diagnosis not present

## 2020-06-03 DIAGNOSIS — I2582 Chronic total occlusion of coronary artery: Secondary | ICD-10-CM | POA: Diagnosis not present

## 2020-06-03 DIAGNOSIS — R1312 Dysphagia, oropharyngeal phase: Secondary | ICD-10-CM | POA: Diagnosis not present

## 2020-06-03 DIAGNOSIS — E539 Vitamin B deficiency, unspecified: Secondary | ICD-10-CM | POA: Diagnosis not present

## 2020-06-03 DIAGNOSIS — J441 Chronic obstructive pulmonary disease with (acute) exacerbation: Secondary | ICD-10-CM | POA: Diagnosis not present

## 2020-06-03 DIAGNOSIS — I739 Peripheral vascular disease, unspecified: Secondary | ICD-10-CM | POA: Diagnosis not present

## 2020-06-03 DIAGNOSIS — R52 Pain, unspecified: Secondary | ICD-10-CM | POA: Diagnosis not present

## 2020-06-03 DIAGNOSIS — K219 Gastro-esophageal reflux disease without esophagitis: Secondary | ICD-10-CM | POA: Diagnosis not present

## 2020-06-06 DIAGNOSIS — R0902 Hypoxemia: Secondary | ICD-10-CM | POA: Diagnosis not present

## 2020-06-06 DIAGNOSIS — I2582 Chronic total occlusion of coronary artery: Secondary | ICD-10-CM | POA: Diagnosis not present

## 2020-06-06 DIAGNOSIS — R1312 Dysphagia, oropharyngeal phase: Secondary | ICD-10-CM | POA: Diagnosis not present

## 2020-06-06 DIAGNOSIS — R52 Pain, unspecified: Secondary | ICD-10-CM | POA: Diagnosis not present

## 2020-06-06 DIAGNOSIS — I10 Essential (primary) hypertension: Secondary | ICD-10-CM | POA: Diagnosis not present

## 2020-06-06 DIAGNOSIS — E785 Hyperlipidemia, unspecified: Secondary | ICD-10-CM | POA: Diagnosis not present

## 2020-06-06 DIAGNOSIS — E539 Vitamin B deficiency, unspecified: Secondary | ICD-10-CM | POA: Diagnosis not present

## 2020-06-06 DIAGNOSIS — J441 Chronic obstructive pulmonary disease with (acute) exacerbation: Secondary | ICD-10-CM | POA: Diagnosis not present

## 2020-06-06 DIAGNOSIS — I739 Peripheral vascular disease, unspecified: Secondary | ICD-10-CM | POA: Diagnosis not present

## 2020-06-06 DIAGNOSIS — K219 Gastro-esophageal reflux disease without esophagitis: Secondary | ICD-10-CM | POA: Diagnosis not present

## 2020-06-07 DIAGNOSIS — J441 Chronic obstructive pulmonary disease with (acute) exacerbation: Secondary | ICD-10-CM | POA: Diagnosis not present

## 2020-06-07 DIAGNOSIS — E539 Vitamin B deficiency, unspecified: Secondary | ICD-10-CM | POA: Diagnosis not present

## 2020-06-07 DIAGNOSIS — I739 Peripheral vascular disease, unspecified: Secondary | ICD-10-CM | POA: Diagnosis not present

## 2020-06-07 DIAGNOSIS — K219 Gastro-esophageal reflux disease without esophagitis: Secondary | ICD-10-CM | POA: Diagnosis not present

## 2020-06-07 DIAGNOSIS — E785 Hyperlipidemia, unspecified: Secondary | ICD-10-CM | POA: Diagnosis not present

## 2020-06-07 DIAGNOSIS — R52 Pain, unspecified: Secondary | ICD-10-CM | POA: Diagnosis not present

## 2020-06-07 DIAGNOSIS — I2582 Chronic total occlusion of coronary artery: Secondary | ICD-10-CM | POA: Diagnosis not present

## 2020-06-07 DIAGNOSIS — R1312 Dysphagia, oropharyngeal phase: Secondary | ICD-10-CM | POA: Diagnosis not present

## 2020-06-07 DIAGNOSIS — R0902 Hypoxemia: Secondary | ICD-10-CM | POA: Diagnosis not present

## 2020-06-07 DIAGNOSIS — I10 Essential (primary) hypertension: Secondary | ICD-10-CM | POA: Diagnosis not present

## 2020-06-08 DIAGNOSIS — J441 Chronic obstructive pulmonary disease with (acute) exacerbation: Secondary | ICD-10-CM | POA: Diagnosis not present

## 2020-06-08 DIAGNOSIS — R0902 Hypoxemia: Secondary | ICD-10-CM | POA: Diagnosis not present

## 2020-06-08 DIAGNOSIS — I739 Peripheral vascular disease, unspecified: Secondary | ICD-10-CM | POA: Diagnosis not present

## 2020-06-08 DIAGNOSIS — I2582 Chronic total occlusion of coronary artery: Secondary | ICD-10-CM | POA: Diagnosis not present

## 2020-06-08 DIAGNOSIS — I10 Essential (primary) hypertension: Secondary | ICD-10-CM | POA: Diagnosis not present

## 2020-06-08 DIAGNOSIS — R52 Pain, unspecified: Secondary | ICD-10-CM | POA: Diagnosis not present

## 2020-06-08 DIAGNOSIS — M6281 Muscle weakness (generalized): Secondary | ICD-10-CM | POA: Diagnosis not present

## 2020-06-08 DIAGNOSIS — E785 Hyperlipidemia, unspecified: Secondary | ICD-10-CM | POA: Diagnosis not present

## 2020-06-08 DIAGNOSIS — E539 Vitamin B deficiency, unspecified: Secondary | ICD-10-CM | POA: Diagnosis not present

## 2020-06-08 DIAGNOSIS — K219 Gastro-esophageal reflux disease without esophagitis: Secondary | ICD-10-CM | POA: Diagnosis not present

## 2020-06-09 DIAGNOSIS — I10 Essential (primary) hypertension: Secondary | ICD-10-CM | POA: Diagnosis not present

## 2020-06-09 DIAGNOSIS — I739 Peripheral vascular disease, unspecified: Secondary | ICD-10-CM | POA: Diagnosis not present

## 2020-06-09 DIAGNOSIS — G4700 Insomnia, unspecified: Secondary | ICD-10-CM | POA: Diagnosis not present

## 2020-06-09 DIAGNOSIS — I2582 Chronic total occlusion of coronary artery: Secondary | ICD-10-CM | POA: Diagnosis not present

## 2020-06-09 DIAGNOSIS — K219 Gastro-esophageal reflux disease without esophagitis: Secondary | ICD-10-CM | POA: Diagnosis not present

## 2020-06-09 DIAGNOSIS — E539 Vitamin B deficiency, unspecified: Secondary | ICD-10-CM | POA: Diagnosis not present

## 2020-06-09 DIAGNOSIS — M6281 Muscle weakness (generalized): Secondary | ICD-10-CM | POA: Diagnosis not present

## 2020-06-09 DIAGNOSIS — E785 Hyperlipidemia, unspecified: Secondary | ICD-10-CM | POA: Diagnosis not present

## 2020-06-09 DIAGNOSIS — R52 Pain, unspecified: Secondary | ICD-10-CM | POA: Diagnosis not present

## 2020-06-09 DIAGNOSIS — J441 Chronic obstructive pulmonary disease with (acute) exacerbation: Secondary | ICD-10-CM | POA: Diagnosis not present

## 2020-06-09 DIAGNOSIS — R0902 Hypoxemia: Secondary | ICD-10-CM | POA: Diagnosis not present

## 2020-06-10 DIAGNOSIS — J441 Chronic obstructive pulmonary disease with (acute) exacerbation: Secondary | ICD-10-CM | POA: Diagnosis not present

## 2020-06-10 DIAGNOSIS — I739 Peripheral vascular disease, unspecified: Secondary | ICD-10-CM | POA: Diagnosis not present

## 2020-06-10 DIAGNOSIS — E785 Hyperlipidemia, unspecified: Secondary | ICD-10-CM | POA: Diagnosis not present

## 2020-06-10 DIAGNOSIS — E539 Vitamin B deficiency, unspecified: Secondary | ICD-10-CM | POA: Diagnosis not present

## 2020-06-10 DIAGNOSIS — R52 Pain, unspecified: Secondary | ICD-10-CM | POA: Diagnosis not present

## 2020-06-10 DIAGNOSIS — M6281 Muscle weakness (generalized): Secondary | ICD-10-CM | POA: Diagnosis not present

## 2020-06-10 DIAGNOSIS — I2582 Chronic total occlusion of coronary artery: Secondary | ICD-10-CM | POA: Diagnosis not present

## 2020-06-10 DIAGNOSIS — K219 Gastro-esophageal reflux disease without esophagitis: Secondary | ICD-10-CM | POA: Diagnosis not present

## 2020-06-10 DIAGNOSIS — I10 Essential (primary) hypertension: Secondary | ICD-10-CM | POA: Diagnosis not present

## 2020-06-10 DIAGNOSIS — R0902 Hypoxemia: Secondary | ICD-10-CM | POA: Diagnosis not present

## 2020-06-13 DIAGNOSIS — R52 Pain, unspecified: Secondary | ICD-10-CM | POA: Diagnosis not present

## 2020-06-13 DIAGNOSIS — E785 Hyperlipidemia, unspecified: Secondary | ICD-10-CM | POA: Diagnosis not present

## 2020-06-13 DIAGNOSIS — I10 Essential (primary) hypertension: Secondary | ICD-10-CM | POA: Diagnosis not present

## 2020-06-13 DIAGNOSIS — M6281 Muscle weakness (generalized): Secondary | ICD-10-CM | POA: Diagnosis not present

## 2020-06-13 DIAGNOSIS — E539 Vitamin B deficiency, unspecified: Secondary | ICD-10-CM | POA: Diagnosis not present

## 2020-06-13 DIAGNOSIS — R0902 Hypoxemia: Secondary | ICD-10-CM | POA: Diagnosis not present

## 2020-06-13 DIAGNOSIS — I739 Peripheral vascular disease, unspecified: Secondary | ICD-10-CM | POA: Diagnosis not present

## 2020-06-13 DIAGNOSIS — I2582 Chronic total occlusion of coronary artery: Secondary | ICD-10-CM | POA: Diagnosis not present

## 2020-06-13 DIAGNOSIS — K219 Gastro-esophageal reflux disease without esophagitis: Secondary | ICD-10-CM | POA: Diagnosis not present

## 2020-06-13 DIAGNOSIS — J441 Chronic obstructive pulmonary disease with (acute) exacerbation: Secondary | ICD-10-CM | POA: Diagnosis not present

## 2020-07-01 ENCOUNTER — Ambulatory Visit: Payer: Self-pay | Admitting: Urology

## 2020-07-13 DIAGNOSIS — L942 Calcinosis cutis: Secondary | ICD-10-CM | POA: Diagnosis not present

## 2020-07-13 DIAGNOSIS — I509 Heart failure, unspecified: Secondary | ICD-10-CM | POA: Diagnosis not present

## 2020-07-13 DIAGNOSIS — I10 Essential (primary) hypertension: Secondary | ICD-10-CM | POA: Diagnosis not present

## 2020-07-15 DIAGNOSIS — I10 Essential (primary) hypertension: Secondary | ICD-10-CM | POA: Diagnosis not present

## 2020-07-19 DIAGNOSIS — L989 Disorder of the skin and subcutaneous tissue, unspecified: Secondary | ICD-10-CM | POA: Diagnosis not present

## 2020-07-19 DIAGNOSIS — I10 Essential (primary) hypertension: Secondary | ICD-10-CM | POA: Diagnosis not present

## 2020-07-19 DIAGNOSIS — I509 Heart failure, unspecified: Secondary | ICD-10-CM | POA: Diagnosis not present

## 2020-07-27 DIAGNOSIS — I10 Essential (primary) hypertension: Secondary | ICD-10-CM | POA: Diagnosis not present

## 2020-07-27 DIAGNOSIS — I509 Heart failure, unspecified: Secondary | ICD-10-CM | POA: Diagnosis not present

## 2020-08-04 ENCOUNTER — Encounter: Payer: Self-pay | Admitting: Emergency Medicine

## 2020-08-04 ENCOUNTER — Other Ambulatory Visit: Payer: Self-pay

## 2020-08-04 ENCOUNTER — Emergency Department
Admission: EM | Admit: 2020-08-04 | Discharge: 2020-08-04 | Disposition: A | Discharge status: Home / Self Care | Payer: Medicare Other | Attending: Emergency Medicine | Admitting: Emergency Medicine

## 2020-08-04 ENCOUNTER — Emergency Department: Payer: Medicare Other

## 2020-08-04 DIAGNOSIS — Z87891 Personal history of nicotine dependence: Secondary | ICD-10-CM | POA: Diagnosis not present

## 2020-08-04 DIAGNOSIS — Z7982 Long term (current) use of aspirin: Secondary | ICD-10-CM | POA: Insufficient documentation

## 2020-08-04 DIAGNOSIS — Z743 Need for continuous supervision: Secondary | ICD-10-CM | POA: Diagnosis not present

## 2020-08-04 DIAGNOSIS — R0689 Other abnormalities of breathing: Secondary | ICD-10-CM | POA: Diagnosis not present

## 2020-08-04 DIAGNOSIS — J449 Chronic obstructive pulmonary disease, unspecified: Secondary | ICD-10-CM | POA: Insufficient documentation

## 2020-08-04 DIAGNOSIS — R6889 Other general symptoms and signs: Secondary | ICD-10-CM | POA: Diagnosis not present

## 2020-08-04 DIAGNOSIS — Z7951 Long term (current) use of inhaled steroids: Secondary | ICD-10-CM | POA: Diagnosis not present

## 2020-08-04 DIAGNOSIS — Z79899 Other long term (current) drug therapy: Secondary | ICD-10-CM | POA: Insufficient documentation

## 2020-08-04 DIAGNOSIS — R0602 Shortness of breath: Secondary | ICD-10-CM | POA: Diagnosis not present

## 2020-08-04 DIAGNOSIS — Z7901 Long term (current) use of anticoagulants: Secondary | ICD-10-CM | POA: Insufficient documentation

## 2020-08-04 DIAGNOSIS — I10 Essential (primary) hypertension: Secondary | ICD-10-CM | POA: Insufficient documentation

## 2020-08-04 DIAGNOSIS — R5381 Other malaise: Secondary | ICD-10-CM | POA: Diagnosis not present

## 2020-08-04 DIAGNOSIS — Z20822 Contact with and (suspected) exposure to covid-19: Secondary | ICD-10-CM | POA: Diagnosis not present

## 2020-08-04 DIAGNOSIS — R531 Weakness: Secondary | ICD-10-CM | POA: Diagnosis not present

## 2020-08-04 DIAGNOSIS — Z7401 Bed confinement status: Secondary | ICD-10-CM | POA: Diagnosis not present

## 2020-08-04 DIAGNOSIS — R0600 Dyspnea, unspecified: Secondary | ICD-10-CM | POA: Diagnosis not present

## 2020-08-04 LAB — CBC WITH DIFFERENTIAL/PLATELET
Abs Immature Granulocytes: 0.01 10*3/uL (ref 0.00–0.07)
Basophils Absolute: 0 10*3/uL (ref 0.0–0.1)
Basophils Relative: 0 %
Eosinophils Absolute: 0 10*3/uL (ref 0.0–0.5)
Eosinophils Relative: 0 %
HCT: 40.1 % (ref 39.0–52.0)
Hemoglobin: 12.6 g/dL — ABNORMAL LOW (ref 13.0–17.0)
Immature Granulocytes: 0 %
Lymphocytes Relative: 8 %
Lymphs Abs: 0.4 10*3/uL — ABNORMAL LOW (ref 0.7–4.0)
MCH: 29.5 pg (ref 26.0–34.0)
MCHC: 31.4 g/dL (ref 30.0–36.0)
MCV: 93.9 fL (ref 80.0–100.0)
Monocytes Absolute: 0.5 10*3/uL (ref 0.1–1.0)
Monocytes Relative: 8 %
Neutro Abs: 4.5 10*3/uL (ref 1.7–7.7)
Neutrophils Relative %: 84 %
Platelets: 135 10*3/uL — ABNORMAL LOW (ref 150–400)
RBC: 4.27 MIL/uL (ref 4.22–5.81)
RDW: 12.4 % (ref 11.5–15.5)
WBC: 5.4 10*3/uL (ref 4.0–10.5)
nRBC: 0 % (ref 0.0–0.2)

## 2020-08-04 LAB — BRAIN NATRIURETIC PEPTIDE: B Natriuretic Peptide: 216.9 pg/mL — ABNORMAL HIGH (ref 0.0–100.0)

## 2020-08-04 LAB — BASIC METABOLIC PANEL
Anion gap: 6 (ref 5–15)
BUN: 21 mg/dL (ref 8–23)
CO2: 39 mmol/L — ABNORMAL HIGH (ref 22–32)
Calcium: 8.1 mg/dL — ABNORMAL LOW (ref 8.9–10.3)
Chloride: 96 mmol/L — ABNORMAL LOW (ref 98–111)
Creatinine, Ser: 0.95 mg/dL (ref 0.61–1.24)
GFR, Estimated: >60 mL/min (ref >60)
Glucose, Bld: 111 mg/dL — ABNORMAL HIGH (ref 70–99)
Potassium: 3.8 mmol/L (ref 3.5–5.1)
Sodium: 141 mmol/L (ref 135–145)

## 2020-08-04 LAB — RESP PANEL BY RT-PCR (FLU A&B, COVID) ARPGX2
Influenza A by PCR: NEGATIVE
Influenza B by PCR: NEGATIVE
SARS Coronavirus 2 by RT PCR: NEGATIVE

## 2020-08-04 NOTE — ED Notes (Signed)
Verbal consent to transfer patient back to facility. [facility]

## 2020-08-04 NOTE — ED Provider Notes (Signed)
[facility] Emergency Department Provider Note  ____________________________________________   Event Date/Time   First MD Initiated Contact with Patient 08/04/20 1905     (approximate)  I have reviewed the triage vital signs and the nursing notes.   HISTORY  Chief Complaint Shortness of Breath  HPI provided by facility staff via EMS.   HPI Anthony Chambers is a 75 y.o. male with the below medical history, including chronic hypoxia requiring supplemental oxygen, COPD, DVT, GERD and PVD,  presents to the ED from [facility], via EMS, where the facility reported the patient experienced shortness of breath and was in respiratory distress.  Upon arrival of the EMS team, patient was A&O x4 and at his full baseline requirement of 4L per nasal cannula and satting 94%.  Patient normotensive blood pressures at 130/53.  Patient does not verbalize any complaints of pain or shortness of breath.  Past Medical History:  Diagnosis Date   Depression    GERD (gastroesophageal reflux disease)    Hyperlipidemia    Hypertension    Schizophrenia (HCC)    Total occlusion of coronary artery, chronic     Patient Active Problem List   Diagnosis Date Noted   DVT (deep venous thrombosis) (HCC) 10/20/2019   GERD (gastroesophageal reflux disease) 10/20/2019   Hyperlipidemia 10/20/2019   Acute respiratory failure with hypoxia and hypercapnia (HCC) 06/01/2018   PVD (peripheral vascular disease) (HCC) 08/15/2012    History reviewed. No pertinent surgical history.  Prior to Admission medications   Medication Sig Start Date End Date Taking? Authorizing Provider  acetaminophen (TYLENOL) 325 MG tablet Take by mouth every 4 (four) hours as needed for mild pain or moderate pain.    [provider]  acetaminophen (TYLENOL) 500 MG tablet Take 1,000 mg by mouth at bedtime.    [provider]  albuterol (VENTOLIN HFA) 108 (90 Base) MCG/ACT inhaler Inhale 2 puffs into  the lungs every 4 (four) hours as needed for wheezing or shortness of breath.    [provider]  furosemide (LASIX) 40 MG tablet Take 40 mg by mouth daily. 03/24/19   [provider]  guaifenesin (ROBITUSSIN) 100 MG/5ML syrup Take 200 mg by mouth every 6 (six) hours as needed for cough.    [provider]  Ipratropium-Albuterol (COMBIVENT RESPIMAT) 20-100 MCG/ACT AERS respimat Inhale 1 puff into the lungs in the morning, at noon, in the evening, and at bedtime.    [provider]  Melatonin 5 MG TABS Take 5 mg by mouth at bedtime.     [provider]  metoprolol succinate (TOPROL-XL) 25 MG 24 hr tablet Take 25 mg by mouth daily.    [provider]  Multiple Vitamin (MULTIVITAMIN WITH MINERALS) TABS tablet Take 1 tablet by mouth daily.    [provider]  OLANZapine (ZYPREXA) 15 MG tablet Take 30 mg by mouth daily.    [provider]  omeprazole (PRILOSEC) 20 MG capsule Take 20 mg by mouth daily.    [provider]  OXYGEN Inhale 4 L/min into the lungs continuous.    [provider]  PARoxetine (PAXIL-CR) 25 MG 24 hr tablet Take 25 mg by mouth daily.    [provider]  risperiDONE (RISPERDAL) 1 MG tablet Take 1 mg by mouth 2 (two) times daily.     [provider]  rivaroxaban (XARELTO) 20 MG TABS tablet Take 20 mg by mouth daily.    [provider]  saccharomyces boulardii (  FLORASTOR) 250 MG capsule Take 250 mg by mouth 2 (two) times daily.    [provider]  sennosides-docusate sodium (SENOKOT-S) 8.6-50 MG tablet Take 1 tablet by mouth in the morning and at bedtime.    [provider]  simvastatin (ZOCOR) 20 MG tablet Take 20 mg by mouth at bedtime.    [provider]  tamsulosin (FLOMAX) 0.4 MG CAPS capsule Take 1 capsule (0.4 mg total) by mouth daily after supper. 03/27/19   Jerilee Field, MD  triamcinolone cream (KENALOG) 0.5 % Apply 1 application  topically 2 (two) times daily.    [provider]    Allergies Heparin  Family History  Problem Relation Age of Onset   Asthma Mother     Social History Social History   Tobacco Use   Smoking status: Former    Years: 40.00    Types: Cigarettes   Smokeless tobacco: Never  Substance Use Topics   Alcohol use: Not Currently   Drug use: Never    Review of Systems  Constitutional: No fever/chills Eyes: No visual changes. ENT: No sore throat. Cardiovascular: Denies chest pain. Respiratory: Denies shortness of breath. Gastrointestinal: No abdominal pain.  No nausea, no vomiting.  No diarrhea.  No constipation. Genitourinary: Negative for dysuria. Musculoskeletal: Negative for back pain. Skin: Negative for rash. Chronic BLE ulcers Neurological: Negative for headaches, focal weakness or numbness. ____________________________________________   PHYSICAL EXAM:  VITAL SIGNS: ED Triage Vitals [08/04/20 1914]  Enc Vitals Group     BP      Pulse      Resp      Temp      Temp src      SpO2      Weight 250 lb (113.4 kg)     Height 6\' 2"  (1.88 m)     Head Circumference      [facility] Flow      Pain Score 0     Pain Loc      Pain Edu?      Excl. in GC?     Constitutional: Alert and oriented. Well appearing and in no acute distress. Eyes: Conjunctivae are normal. PERRL. EOMI. Head: Atraumatic. Mouth/Throat: Mucous membranes are moist.  Oropharynx non-erythematous. Neck: No stridor.   Cardiovascular: Normal rate, regular rhythm. Grossly normal heart sounds.  Good peripheral circulation. Respiratory: Normal respiratory effort.  No retractions. Lungs CTAB. Gastrointestinal: Soft and nontender. No distention. No abdominal bruits. No CVA tenderness. Musculoskeletal: No lower extremity tenderness nor edema.  No joint effusions. Neurologic:  Normal speech and language. No gross focal neurologic deficits are appreciated. No gait instability. Skin:  Skin is warm, dry and  intact. No rash noted. Chronic, stable BLE skin changes. Psychiatric: Mood and affect are normal. Speech and behavior are normal.  ____________________________________________   LABS (all labs ordered are listed, but only abnormal results are displayed)  Labs Reviewed  CBC WITH DIFFERENTIAL/PLATELET - Abnormal; Notable for the following components:      Result Value   Hemoglobin 12.6 (*)    Platelets 135 (*)    Lymphs Abs 0.4 (*)    All other components within normal limits  BASIC METABOLIC PANEL - Abnormal; Notable for the following components:   Chloride 96 (*)    CO2 39 (*)    Glucose, Bld 111 (*)    Calcium 8.1 (*)    All other components within normal limits  RESP PANEL BY RT-PCR (FLU A&B, COVID) ARPGX2  BRAIN NATRIURETIC PEPTIDE  ____________________________________________  EKG  Vent. rate 65 BPM Atrial premature complexes RBBB PR interval 201 ms QRS duration 144 ms QT/QTcB 436/454 ms P-R-T axes 53 -56 47 No STEMI ____________________________________________  RADIOLOGY I, Lissa Hoard, personally viewed and evaluated these images (plain radiographs) as part of my medical decision making, as well as reviewing the written report by the radiologist.  ED MD interpretation:  agree with report  Official radiology report(s): DG Chest Port 1 View  Result Date: 08/04/2020 CLINICAL DATA:  Dyspnea, respiratory distress EXAM: PORTABLE CHEST 1 VIEW COMPARISON:  06/19/2018 chest radiograph. FINDINGS: Low lung volumes. Stable cardiomediastinal silhouette with normal heart size. No pneumothorax. No pleural effusion. Hazy streaky bibasilar lung opacities. IMPRESSION: Low lung volumes. Hazy streaky bibasilar lung opacities, favor atelectasis. Electronically Signed   By: Delbert Phenix M.D.   On: 08/04/2020 19:45    ____________________________________________   PROCEDURES  Procedure(s) performed (including Critical  Care):  Procedures   ____________________________________________   INITIAL IMPRESSION / ASSESSMENT AND PLAN / ED COURSE  As part of my medical decision making, I reviewed the following data within the electronic MEDICAL RECORD NUMBER Labs reviewed as noted, EKG interpreted NSR and RBBB, Radiograph reviewed WNL, and Notes from prior ED visits    Differential includes, but is not limited to, viral syndrome, bronchitis including COPD exacerbation, pneumonia, reactive airway disease including asthma, CHF including exacerbation with or without pulmonary/interstitial edema, pneumothorax, ACS, thoracic trauma, and pulmonary embolism.  Patient with ED evaluation of reports of shortness of breath from the staff at [facility].  Patient presents stable condition with O2 sats in the low to mid 90s on his baseline 4 L per nasal cannula.  Labs and chest x-ray that reported at this time are within normal limits and no acute infectious process shown on chest x-ray.  Patient tired screen is pending at this time as is his BNP.  Final management disposition will be transferred to my attending, Karie Georges, MD  ____________________________________________   FINAL CLINICAL IMPRESSION(S) / ED DIAGNOSES  Final diagnoses:  SOB (shortness of breath)     ED Discharge Orders     None        Note:  This document was prepared using Dragon voice recognition software and may include unintentional dictation errors.    Lissa Hoard, PA-C 08/04/20 1956    Merwyn Katos, MD 08/05/20 [phone]

## 2020-08-04 NOTE — ED Triage Notes (Signed)
Pt to ED via EMS from [facility]. Per EMS, facility stated that pt was SOB and in respiratory distress. Upon arrival pt is a/o x4. Pt on 4L at baseline at 94%. Pt BP 130/53. Pt denies any pain at this time.

## 2020-08-05 DIAGNOSIS — J441 Chronic obstructive pulmonary disease with (acute) exacerbation: Secondary | ICD-10-CM | POA: Diagnosis not present

## 2020-08-05 DIAGNOSIS — I10 Essential (primary) hypertension: Secondary | ICD-10-CM | POA: Diagnosis not present

## 2020-08-05 DIAGNOSIS — L988 Other specified disorders of the skin and subcutaneous tissue: Secondary | ICD-10-CM | POA: Diagnosis not present

## 2020-08-07 ENCOUNTER — Encounter: Payer: Self-pay | Admitting: Internal Medicine

## 2020-08-07 ENCOUNTER — Inpatient Hospital Stay
Admission: EM | Admit: 2020-08-07 | Discharge: 2020-08-11 | DRG: 291 | Disposition: A | Discharge status: Skilled Nursing Facility | Payer: Medicare Other | Source: Skilled Nursing Facility | Attending: Family Medicine | Admitting: Family Medicine

## 2020-08-07 ENCOUNTER — Observation Stay: Payer: Medicare Other

## 2020-08-07 ENCOUNTER — Emergency Department: Payer: Medicare Other

## 2020-08-07 DIAGNOSIS — R0602 Shortness of breath: Secondary | ICD-10-CM

## 2020-08-07 DIAGNOSIS — Z20822 Contact with and (suspected) exposure to covid-19: Secondary | ICD-10-CM | POA: Diagnosis not present

## 2020-08-07 DIAGNOSIS — Z9981 Dependence on supplemental oxygen: Secondary | ICD-10-CM | POA: Diagnosis not present

## 2020-08-07 DIAGNOSIS — J9602 Acute respiratory failure with hypercapnia: Secondary | ICD-10-CM | POA: Diagnosis not present

## 2020-08-07 DIAGNOSIS — D6959 Other secondary thrombocytopenia: Secondary | ICD-10-CM | POA: Diagnosis present

## 2020-08-07 DIAGNOSIS — I499 Cardiac arrhythmia, unspecified: Secondary | ICD-10-CM | POA: Diagnosis not present

## 2020-08-07 DIAGNOSIS — E872 Acidosis: Secondary | ICD-10-CM | POA: Diagnosis present

## 2020-08-07 DIAGNOSIS — Z743 Need for continuous supervision: Secondary | ICD-10-CM | POA: Diagnosis not present

## 2020-08-07 DIAGNOSIS — I878 Other specified disorders of veins: Secondary | ICD-10-CM | POA: Diagnosis not present

## 2020-08-07 DIAGNOSIS — K219 Gastro-esophageal reflux disease without esophagitis: Secondary | ICD-10-CM | POA: Diagnosis present

## 2020-08-07 DIAGNOSIS — J9601 Acute respiratory failure with hypoxia: Secondary | ICD-10-CM | POA: Diagnosis present

## 2020-08-07 DIAGNOSIS — I739 Peripheral vascular disease, unspecified: Secondary | ICD-10-CM | POA: Diagnosis present

## 2020-08-07 DIAGNOSIS — E785 Hyperlipidemia, unspecified: Secondary | ICD-10-CM | POA: Diagnosis not present

## 2020-08-07 DIAGNOSIS — N400 Enlarged prostate without lower urinary tract symptoms: Secondary | ICD-10-CM | POA: Diagnosis present

## 2020-08-07 DIAGNOSIS — D6489 Other specified anemias: Secondary | ICD-10-CM | POA: Diagnosis not present

## 2020-08-07 DIAGNOSIS — Z9119 Patient's noncompliance with other medical treatment and regimen: Secondary | ICD-10-CM | POA: Diagnosis not present

## 2020-08-07 DIAGNOSIS — I82409 Acute embolism and thrombosis of unspecified deep veins of unspecified lower extremity: Secondary | ICD-10-CM | POA: Diagnosis present

## 2020-08-07 DIAGNOSIS — N179 Acute kidney failure, unspecified: Secondary | ICD-10-CM | POA: Diagnosis present

## 2020-08-07 DIAGNOSIS — R069 Unspecified abnormalities of breathing: Secondary | ICD-10-CM | POA: Diagnosis not present

## 2020-08-07 DIAGNOSIS — F0390 Unspecified dementia without behavioral disturbance: Secondary | ICD-10-CM | POA: Diagnosis present

## 2020-08-07 DIAGNOSIS — I517 Cardiomegaly: Secondary | ICD-10-CM | POA: Diagnosis not present

## 2020-08-07 DIAGNOSIS — I110 Hypertensive heart disease with heart failure: Principal | ICD-10-CM | POA: Diagnosis present

## 2020-08-07 DIAGNOSIS — I5033 Acute on chronic diastolic (congestive) heart failure: Secondary | ICD-10-CM | POA: Diagnosis not present

## 2020-08-07 DIAGNOSIS — R404 Transient alteration of awareness: Secondary | ICD-10-CM | POA: Diagnosis not present

## 2020-08-07 DIAGNOSIS — J9621 Acute and chronic respiratory failure with hypoxia: Secondary | ICD-10-CM | POA: Diagnosis present

## 2020-08-07 DIAGNOSIS — Z87891 Personal history of nicotine dependence: Secondary | ICD-10-CM | POA: Diagnosis not present

## 2020-08-07 DIAGNOSIS — G4733 Obstructive sleep apnea (adult) (pediatric): Secondary | ICD-10-CM | POA: Diagnosis present

## 2020-08-07 DIAGNOSIS — R0689 Other abnormalities of breathing: Secondary | ICD-10-CM | POA: Diagnosis not present

## 2020-08-07 DIAGNOSIS — F209 Schizophrenia, unspecified: Secondary | ICD-10-CM | POA: Diagnosis present

## 2020-08-07 DIAGNOSIS — Z86718 Personal history of other venous thrombosis and embolism: Secondary | ICD-10-CM | POA: Diagnosis not present

## 2020-08-07 DIAGNOSIS — Z7901 Long term (current) use of anticoagulants: Secondary | ICD-10-CM | POA: Diagnosis not present

## 2020-08-07 DIAGNOSIS — J441 Chronic obstructive pulmonary disease with (acute) exacerbation: Secondary | ICD-10-CM | POA: Diagnosis present

## 2020-08-07 DIAGNOSIS — G9341 Metabolic encephalopathy: Secondary | ICD-10-CM | POA: Diagnosis present

## 2020-08-07 DIAGNOSIS — E876 Hypokalemia: Secondary | ICD-10-CM

## 2020-08-07 DIAGNOSIS — T502X5A Adverse effect of carbonic-anhydrase inhibitors, benzothiadiazides and other diuretics, initial encounter: Secondary | ICD-10-CM | POA: Diagnosis not present

## 2020-08-07 LAB — CBC WITH DIFFERENTIAL/PLATELET
Abs Immature Granulocytes: 0.05 10*3/uL (ref 0.00–0.07)
Basophils Absolute: 0 10*3/uL (ref 0.0–0.1)
Basophils Relative: 0 %
Eosinophils Absolute: 0.1 10*3/uL (ref 0.0–0.5)
Eosinophils Relative: 2 %
HCT: 38.6 % — ABNORMAL LOW (ref 39.0–52.0)
Hemoglobin: 12 g/dL — ABNORMAL LOW (ref 13.0–17.0)
Immature Granulocytes: 1 %
Lymphocytes Relative: 20 %
Lymphs Abs: 1.2 10*3/uL (ref 0.7–4.0)
MCH: 30.2 pg (ref 26.0–34.0)
MCHC: 31.1 g/dL (ref 30.0–36.0)
MCV: 97 fL (ref 80.0–100.0)
Monocytes Absolute: 0.7 10*3/uL (ref 0.1–1.0)
Monocytes Relative: 12 %
Neutro Abs: 3.9 10*3/uL (ref 1.7–7.7)
Neutrophils Relative %: 65 %
Platelets: 128 10*3/uL — ABNORMAL LOW (ref 150–400)
RBC: 3.98 MIL/uL — ABNORMAL LOW (ref 4.22–5.81)
RDW: 12.4 % (ref 11.5–15.5)
WBC: 6.1 10*3/uL (ref 4.0–10.5)
nRBC: 0 % (ref 0.0–0.2)

## 2020-08-07 LAB — BLOOD GAS, ARTERIAL
Acid-Base Excess: 16.5 mmol/L — ABNORMAL HIGH (ref 0.0–2.0)
Bicarbonate: 45.8 mmol/L — ABNORMAL HIGH (ref 20.0–28.0)
FIO2: 0.48
O2 Saturation: 97.5 %
Patient temperature: 37
pCO2 arterial: 81 mmHg (ref 32.0–48.0)
pH, Arterial: 7.36 (ref 7.350–7.450)
pO2, Arterial: 100 mmHg (ref 83.0–108.0)

## 2020-08-07 LAB — COMPREHENSIVE METABOLIC PANEL
ALT: 12 U/L (ref 0–44)
AST: 16 U/L (ref 15–41)
Albumin: 3 g/dL — ABNORMAL LOW (ref 3.5–5.0)
Alkaline Phosphatase: 66 U/L (ref 38–126)
Anion gap: 1 — ABNORMAL LOW (ref 5–15)
BUN: 20 mg/dL (ref 8–23)
CO2: 41 mmol/L — ABNORMAL HIGH (ref 22–32)
Calcium: 8.3 mg/dL — ABNORMAL LOW (ref 8.9–10.3)
Chloride: 99 mmol/L (ref 98–111)
Creatinine, Ser: 0.98 mg/dL (ref 0.61–1.24)
GFR, Estimated: >60 mL/min (ref >60)
Glucose, Bld: 127 mg/dL — ABNORMAL HIGH (ref 70–99)
Potassium: 4.1 mmol/L (ref 3.5–5.1)
Sodium: 141 mmol/L (ref 135–145)
Total Bilirubin: 0.4 mg/dL (ref 0.3–1.2)
Total Protein: 6.5 g/dL (ref 6.5–8.1)

## 2020-08-07 LAB — TROPONIN I (HIGH SENSITIVITY)
Troponin I (High Sensitivity): 14 ng/L (ref <18)
Troponin I (High Sensitivity): 14 ng/L (ref <18)

## 2020-08-07 LAB — RESP PANEL BY RT-PCR (FLU A&B, COVID) ARPGX2
Influenza A by PCR: NEGATIVE
Influenza B by PCR: NEGATIVE
SARS Coronavirus 2 by RT PCR: NEGATIVE

## 2020-08-07 LAB — MAGNESIUM: Magnesium: 2.4 mg/dL (ref 1.7–2.4)

## 2020-08-07 LAB — BRAIN NATRIURETIC PEPTIDE: B Natriuretic Peptide: 377.7 pg/mL — ABNORMAL HIGH (ref 0.0–100.0)

## 2020-08-07 LAB — PROCALCITONIN: Procalcitonin: <0.1 ng/mL

## 2020-08-07 MED ORDER — FUROSEMIDE 10 MG/ML IJ SOLN
40.0000 mg | Freq: Two times a day (BID) | INTRAMUSCULAR | Status: DC
  Administered 2020-08-08 – 2020-08-09 (×3): 40 mg via INTRAVENOUS
  Filled 2020-08-07 (×3): qty 4

## 2020-08-07 MED ORDER — IPRATROPIUM-ALBUTEROL 0.5-2.5 (3) MG/3ML IN SOLN
3.0000 mL | Freq: Once | RESPIRATORY_TRACT | Status: AC
  Administered 2020-08-07: 3 mL via RESPIRATORY_TRACT
  Filled 2020-08-07: qty 3

## 2020-08-07 MED ORDER — RISPERIDONE 1 MG PO TABS
1.0000 mg | ORAL_TABLET | Freq: Two times a day (BID) | ORAL | Status: DC
  Administered 2020-08-08 – 2020-08-11 (×7): 1 mg via ORAL
  Filled 2020-08-07 (×8): qty 1

## 2020-08-07 MED ORDER — TAMSULOSIN HCL 0.4 MG PO CAPS
0.4000 mg | ORAL_CAPSULE | Freq: Every day | ORAL | Status: DC
  Administered 2020-08-08 – 2020-08-10 (×3): 0.4 mg via ORAL
  Filled 2020-08-07 (×3): qty 1

## 2020-08-07 MED ORDER — OLANZAPINE 10 MG PO TABS
30.0000 mg | ORAL_TABLET | Freq: Every day | ORAL | Status: DC
  Administered 2020-08-08 – 2020-08-11 (×4): 30 mg via ORAL
  Filled 2020-08-07 (×4): qty 3

## 2020-08-07 MED ORDER — ALBUTEROL SULFATE (2.5 MG/3ML) 0.083% IN NEBU
2.5000 mg | INHALATION_SOLUTION | Freq: Four times a day (QID) | RESPIRATORY_TRACT | Status: DC
  Administered 2020-08-07 – 2020-08-08 (×3): 2.5 mg via RESPIRATORY_TRACT
  Filled 2020-08-07 (×3): qty 3

## 2020-08-07 MED ORDER — ACETAMINOPHEN 650 MG RE SUPP: 650.0000 mg | Freq: Four times a day (QID) | RECTAL | Status: DC | PRN

## 2020-08-07 MED ORDER — IPRATROPIUM BROMIDE 0.02 % IN SOLN
0.5000 mg | Freq: Four times a day (QID) | RESPIRATORY_TRACT | Status: DC
  Administered 2020-08-07 – 2020-08-08 (×3): 0.5 mg via RESPIRATORY_TRACT
  Filled 2020-08-07 (×3): qty 2.5

## 2020-08-07 MED ORDER — RIVAROXABAN 20 MG PO TABS
20.0000 mg | ORAL_TABLET | Freq: Every day | ORAL | Status: DC
  Administered 2020-08-08 – 2020-08-11 (×4): 20 mg via ORAL
  Filled 2020-08-07 (×4): qty 1

## 2020-08-07 MED ORDER — FUROSEMIDE 10 MG/ML IJ SOLN
60.0000 mg | Freq: Once | INTRAMUSCULAR | Status: AC
  Administered 2020-08-07: 60 mg via INTRAVENOUS
  Filled 2020-08-07: qty 8

## 2020-08-07 MED ORDER — ACETAMINOPHEN 325 MG PO TABS: 650.0000 mg | ORAL_TABLET | Freq: Four times a day (QID) | ORAL | Status: DC | PRN

## 2020-08-07 MED ORDER — PANTOPRAZOLE SODIUM 40 MG PO TBEC
40.0000 mg | DELAYED_RELEASE_TABLET | Freq: Every day | ORAL | Status: DC
  Administered 2020-08-08 – 2020-08-11 (×4): 40 mg via ORAL
  Filled 2020-08-07 (×4): qty 1

## 2020-08-07 MED ORDER — ALBUTEROL SULFATE (2.5 MG/3ML) 0.083% IN NEBU: 2.5000 mg | INHALATION_SOLUTION | RESPIRATORY_TRACT | Status: DC | PRN

## 2020-08-07 MED ORDER — IPRATROPIUM-ALBUTEROL 0.5-2.5 (3) MG/3ML IN SOLN
6.0000 mL | Freq: Once | RESPIRATORY_TRACT | Status: AC
  Administered 2020-08-07: 6 mL via RESPIRATORY_TRACT

## 2020-08-07 MED ORDER — SIMVASTATIN 10 MG PO TABS
20.0000 mg | ORAL_TABLET | Freq: Every day | ORAL | Status: DC
  Administered 2020-08-08 – 2020-08-10 (×3): 20 mg via ORAL
  Filled 2020-08-07 (×4): qty 2

## 2020-08-07 MED ORDER — METOPROLOL SUCCINATE ER 50 MG PO TB24
25.0000 mg | ORAL_TABLET | Freq: Every day | ORAL | Status: DC
  Administered 2020-08-08 – 2020-08-11 (×4): 25 mg via ORAL
  Filled 2020-08-07 (×4): qty 1

## 2020-08-07 MED ORDER — SENNOSIDES-DOCUSATE SODIUM 8.6-50 MG PO TABS: 1.0000 | ORAL_TABLET | Freq: Two times a day (BID) | ORAL | Status: DC | PRN

## 2020-08-07 MED ORDER — BUDESONIDE 0.25 MG/2ML IN SUSP
0.2500 mg | Freq: Two times a day (BID) | RESPIRATORY_TRACT | Status: DC
  Administered 2020-08-07 – 2020-08-11 (×8): 0.25 mg via RESPIRATORY_TRACT
  Filled 2020-08-07 (×9): qty 2

## 2020-08-07 NOTE — H&P (Signed)
History and Physical    PHENG PROKOP LAG:[id number] DOB: 1945/09/13 DOA: 08/07/2020  PCP: Collier Bullock., MD  Patient coming from: Skilled nursing facility.  History is obtained from ER physician and chart.  Did discuss with patient's sister.  Patient appears mildly confused.  Chief Complaint: Shortness of breath.  HPI: KASSEM KIBBE is a 75 y.o. male with history of COPD on 4 L oxygen, chronic CHF, sleep apnea noncompliant with BiPAP, schizophrenia was brought to the ER for the second time in the last 72 hours because of worsening shortness of breath.  As per the report patient has been increasingly short of breath last 4 days.  Did not complain of any chest pain.  Also was increasingly confused.  ED Course: In the ER patient was initially hypoxic requiring 8 L oxygen.  Chest x-ray shows congestion and possible infiltrates.  Labs show high sensitive troponin of 14 BNP of 377 EKG shows normal sinus rhythm.  CBC shows hemoglobin of 12 and platelets of 128 stable when compared to 3 days ago.  COVID test was negative.  Patient was given Lasix 60 mg IV placed on nebulizer and steroids admitted for further management of acute respiratory failure with hypoxia and hypercarbia likely a combination of COPD and CHF.  Patient's ABG shows pH of 7.36 PCO2 of 81 PO2 of 100.  Review of Systems: As per HPI, rest all negative.   Past Medical History:  Diagnosis Date   Depression    GERD (gastroesophageal reflux disease)    Hyperlipidemia    Hypertension    Schizophrenia (HCC)    Total occlusion of coronary artery, chronic     History reviewed. No pertinent surgical history.   reports that he has quit smoking. His smoking use included cigarettes. He has never used smokeless tobacco. He reports previous alcohol use. He reports that he does not use drugs.  Allergies  Allergen Reactions   Heparin     Family History  Problem Relation Age of Onset   Asthma Mother     Prior to Admission  medications   Medication Sig Start Date End Date Taking? Authorizing Provider  acetaminophen (TYLENOL) 325 MG tablet Take by mouth every 4 (four) hours as needed for mild pain or moderate pain.    [provider]  acetaminophen (TYLENOL) 500 MG tablet Take 1,000 mg by mouth at bedtime.    [provider]  albuterol (VENTOLIN HFA) 108 (90 Base) MCG/ACT inhaler Inhale 2 puffs into the lungs every 4 (four) hours as needed for wheezing or shortness of breath.    [provider]  furosemide (LASIX) 40 MG tablet Take 40 mg by mouth daily. 03/24/19   [provider]  guaifenesin (ROBITUSSIN) 100 MG/5ML syrup Take 200 mg by mouth every 6 (six) hours as needed for cough.    [provider]  Ipratropium-Albuterol (COMBIVENT RESPIMAT) 20-100 MCG/ACT AERS respimat Inhale 1 puff into the lungs in the morning, at noon, in the evening, and at bedtime.    [provider]  Melatonin 5 MG TABS Take 5 mg by mouth at bedtime.     [provider]  metoprolol succinate (TOPROL-XL) 25 MG 24 hr tablet Take 25 mg by mouth daily.    [provider]  Multiple Vitamin (MULTIVITAMIN WITH MINERALS) TABS tablet Take 1 tablet by mouth daily.    [provider]  OLANZapine (ZYPREXA) 15 MG tablet Take 30 mg by mouth daily.    [provider]  omeprazole (PRILOSEC) 20 MG capsule Take 20 mg by mouth daily.    [provider]  OXYGEN Inhale 4 L/min into the lungs continuous.    [provider]  PARoxetine (PAXIL-CR) 25 MG 24 hr tablet Take 25 mg by mouth daily.    [provider]  risperiDONE (RISPERDAL) 1 MG tablet Take 1 mg by mouth 2 (two) times daily.     [provider]  rivaroxaban (XARELTO) 20 MG TABS tablet Take 20 mg by mouth daily.    [provider]  saccharomyces boulardii (FLORASTOR) 250 MG capsule Take 250 mg by mouth 2 (two) times daily.    [provider]  sennosides-docusate  sodium (SENOKOT-S) 8.6-50 MG tablet Take 1 tablet by mouth in the morning and at bedtime.    [provider]  simvastatin (ZOCOR) 20 MG tablet Take 20 mg by mouth at bedtime.    [provider]  tamsulosin (FLOMAX) 0.4 MG CAPS capsule Take 1 capsule (0.4 mg total) by mouth daily after supper. 03/27/19   Jerilee Field, MD  triamcinolone cream (KENALOG) 0.5 % Apply 1 application topically 2 (two) times daily.    [provider]    Physical Exam: Constitutional: Moderately built and nourished. Vitals:   08/07/20 1720 08/07/20 1722 08/07/20 1853  BP: 137/80  135/70  Pulse: (!) 55  (!) 58  Resp: 20  17  Temp: 98.9 F (37.2 C)    TempSrc: Oral    SpO2: 98% 99% 96%   Eyes: Anicteric no pallor. ENMT: No discharge from the ears eyes nose and mouth. Neck: No JVD patient no mass felt. Respiratory: Mild expiratory wheeze no crepitations. Cardiovascular: S1-S2 heard. Abdomen: Soft nontender bowel sound present. Musculoskeletal: Mild edema of the both lower extremities. Skin: Chronic skin changes of lower extremities. Neurologic: Alert awake but only oriented to his name appears mildly confused moving all extremities following commands. Psychiatric: Appears mildly confused.   Labs on Admission: I have personally reviewed following labs and imaging studies  CBC: Recent Labs  Lab 08/04/20 1927 08/07/20 1727  WBC 5.4 6.1  NEUTROABS 4.5 3.9  HGB 12.6* 12.0*  HCT 40.1 38.6*  MCV 93.9 97.0  PLT 135* 128*   Basic Metabolic Panel: Recent Labs  Lab 08/04/20 1927 08/07/20 1727  NA 141 141  K 3.8 4.1  CL 96* 99  CO2 39* 41*  GLUCOSE 111* 127*  BUN 21 20  CREATININE 0.95 0.98  CALCIUM 8.1* 8.3*  MG  --  2.4   GFR: Estimated Creatinine Clearance: 89.9 mL/min (by C-G formula based on SCr of 0.98 mg/dL). Liver Function Tests: Recent Labs  Lab 08/07/20 1727  AST 16  ALT 12  ALKPHOS 66  BILITOT 0.4  PROT 6.5  ALBUMIN 3.0*   No results for  input(s): LIPASE, AMYLASE in the last 168 hours. No results for input(s): AMMONIA in the last 168 hours. Coagulation Profile: No results for input(s): INR, PROTIME in the last 168 hours. Cardiac Enzymes: No results for input(s): CKTOTAL, CKMB, CKMBINDEX, TROPONINI in the last 168 hours. BNP (last 3 results) No results for input(s): PROBNP in the last 8760 hours. HbA1C: No results for input(s): HGBA1C in the last 72 hours. CBG: No results for input(s): GLUCAP in the last 168 hours. Lipid Profile: No results for input(s): CHOL, HDL, LDLCALC, TRIG, CHOLHDL, LDLDIRECT in the last 72 hours. Thyroid Function Tests: No results for input(s): TSH, T4TOTAL, FREET4, T3FREE, THYROIDAB in the last 72 hours. Anemia Panel:  No results for input(s): VITAMINB12, FOLATE, FERRITIN, TIBC, IRON, RETICCTPCT in the last 72 hours. Urine analysis: No results found for: COLORURINE, APPEARANCEUR, LABSPEC, PHURINE, GLUCOSEU, HGBUR, BILIRUBINUR, KETONESUR, PROTEINUR, UROBILINOGEN, NITRITE, LEUKOCYTESUR Sepsis Labs: @LABRCNTIP (procalcitonin:4,lacticidven:4) ) Recent Results (from the past 240 hour(s))  Resp Panel by RT-PCR (Flu A&B, Covid) Nasopharyngeal Swab     Status: None   Collection Time: 08/04/20  7:37 PM   Specimen: Nasopharyngeal Swab; Nasopharyngeal(NP) swabs in vial transport medium  Result Value Ref Range Status   SARS Coronavirus 2 by RT PCR NEGATIVE NEGATIVE Final    Comment: (NOTE) SARS-CoV-2 target nucleic acids are NOT DETECTED.  The SARS-CoV-2 RNA is generally detectable in upper respiratory specimens during the acute phase of infection. The lowest concentration of SARS-CoV-2 viral copies this assay can detect is 138 copies/mL. A negative result does not preclude SARS-Cov-2 infection and should not be used as the sole basis for treatment or other patient management decisions. A negative result may occur with  improper specimen collection/handling, submission of specimen other than  nasopharyngeal swab, presence of viral mutation(s) within the areas targeted by this assay, and inadequate number of viral copies(<138 copies/mL). A negative result must be combined with clinical observations, patient history, and epidemiological information. The expected result is Negative.  Fact Sheet for Patients:  08/06/20  Fact Sheet for Healthcare Providers:  BloggerCourse.com  This test is no t yet approved or cleared by the [place] FDA and  has been authorized for detection and/or diagnosis of SARS-CoV-2 by FDA under an Emergency Use Authorization (EUA). This EUA will remain  in effect (meaning this test can be used) for the duration of the COVID-19 declaration under Section 564(b)(1) of the Act, 21 U.S.C.section 360bbb-3(b)(1), unless the authorization is terminated  or revoked sooner.       Influenza A by PCR NEGATIVE NEGATIVE Final   Influenza B by PCR NEGATIVE NEGATIVE Final    Comment: (NOTE) The Xpert Xpress SARS-CoV-2/FLU/RSV plus assay is intended as an aid in the diagnosis of influenza from Nasopharyngeal swab specimens and should not be used as a sole basis for treatment. Nasal washings and aspirates are unacceptable for Xpert Xpress SARS-CoV-2/FLU/RSV testing.  Fact Sheet for Patients: [place]  Fact Sheet for Healthcare Providers: BloggerCourse.com  This test is not yet approved or cleared by the [place] FDA and has been authorized for detection and/or diagnosis of SARS-CoV-2 by FDA under an Emergency Use Authorization (EUA). This EUA will remain in effect (meaning this test can be used) for the duration of the COVID-19 declaration under Section 564(b)(1) of the Act, 21 U.S.C. section 360bbb-3(b)(1), unless the authorization is terminated or revoked.  Performed at [facility], [address] [place], [place]  [place]   Resp Panel by RT-PCR (Flu A&B, Covid) Nasopharyngeal Swab     Status: None   Collection Time: 08/07/20  5:27 PM   Specimen: Nasopharyngeal Swab; Nasopharyngeal(NP) swabs in vial transport medium  Result Value Ref Range Status   SARS Coronavirus 2 by RT PCR NEGATIVE NEGATIVE Final    Comment: (NOTE) SARS-CoV-2 target nucleic acids are NOT DETECTED.  The SARS-CoV-2 RNA is generally detectable in upper respiratory specimens during the acute phase of infection. The lowest concentration of SARS-CoV-2 viral copies this assay can detect is 138 copies/mL. A negative result does not preclude SARS-Cov-2 infection and should not be used as the sole basis for treatment or other patient management decisions. A negative result may occur with  improper specimen collection/handling, submission  of specimen other than nasopharyngeal swab, presence of viral mutation(s) within the areas targeted by this assay, and inadequate number of viral copies(<138 copies/mL). A negative result must be combined with clinical observations, patient history, and epidemiological information. The expected result is Negative.  Fact Sheet for Patients:  BloggerCourse.comhttps://www.fda.gov/media/152166/download  Fact Sheet for Healthcare Providers:  SeriousBroker.ithttps://www.fda.gov/media/152162/download  This test is no t yet approved or cleared by the [place] FDA and  has been authorized for detection and/or diagnosis of SARS-CoV-2 by FDA under an Emergency Use Authorization (EUA). This EUA will remain  in effect (meaning this test can be used) for the duration of the COVID-19 declaration under Section 564(b)(1) of the Act, 21 U.S.C.section 360bbb-3(b)(1), unless the authorization is terminated  or revoked sooner.       Influenza A by PCR NEGATIVE NEGATIVE Final   Influenza B by PCR NEGATIVE NEGATIVE Final    Comment: (NOTE) The Xpert Xpress SARS-CoV-2/FLU/RSV plus assay is intended as an aid in the diagnosis of influenza from  Nasopharyngeal swab specimens and should not be used as a sole basis for treatment. Nasal washings and aspirates are unacceptable for Xpert Xpress SARS-CoV-2/FLU/RSV testing.  Fact Sheet for Patients: BloggerCourse.comhttps://www.fda.gov/media/152166/download  Fact Sheet for Healthcare Providers: SeriousBroker.ithttps://www.fda.gov/media/152162/download  This test is not yet approved or cleared by the [place] FDA and has been authorized for detection and/or diagnosis of SARS-CoV-2 by FDA under an Emergency Use Authorization (EUA). This EUA will remain in effect (meaning this test can be used) for the duration of the COVID-19 declaration under Section 564(b)(1) of the Act, 21 U.S.C. section 360bbb-3(b)(1), unless the authorization is terminated or revoked.  Performed at [facility], [address] [place], [place] [id number]      Radiological Exams on Admission: DG Chest Portable 1 View  Result Date: 08/07/2020 CLINICAL DATA:  Shortness of breath. CHF/COPD exacerbation. Evaluate infiltrate. EXAM: PORTABLE CHEST 1 VIEW COMPARISON:  August 04, 2020 FINDINGS: Coarsened interstitial markings bilaterally with more focal opacity in the left mid lower lung. Stable cardiomegaly. The hila and mediastinum are unchanged. No pneumothorax. No other acute abnormalities. IMPRESSION: 1. Coarsened interstitium bilaterally with more focal opacity in the left base. An infectious process/pneumonia in the left base is considered most likely. Asymmetric edema is considered less likely. Recommend clinical correlation and attention on follow-up. Electronically Signed   By: Gerome Samavid  Williams III M.D   On: 08/07/2020 18:07    EKG: Independently reviewed.  Normal sinus rhythm.  Assessment/Plan Principal Problem:   Acute respiratory failure with hypoxia and hypercapnia (HCC) Active Problems:   DVT (deep venous thrombosis) (HCC)   GERD (gastroesophageal reflux disease)   PVD (peripheral vascular disease) (HCC)   COPD  exacerbation (HCC)    Acute respiratory failure with hypoxia and hypercarbia likely a combination of COPD and CHF for which patient is on nebulizer Pulmicort and Lasix.  Patient did receive Lasix 60 mg IV in the ER.  Closely follow intake output metabolic panel and daily weights.  Last EF measured was 55 to 60% in May 2020.  I have ordered a 2D echo.  Patient as per the report has been noncompliant with his BiPAP at bedtime.  We will keep on antibiotics for COPD. Acute encephalopathy -likely from hypoxia and mild hypercarbia.  CT head is unremarkable.  Closely monitor. History of DVT on Xarelto. Hypertension on Toprol-XL. History of schizophrenia per report on Risperdal and Zyprexa. Anemia and thrombocytopenia appears to be stable when compared to the labs 3 days ago.  History of peripheral vascular disease. Chart mentions that patient has sleep apnea noncompliant with BiPAP due to discomfort.  Some of the home medications needs to be verified.   DVT prophylaxis: Xarelto. Code Status: Full code.  Confirmed with patient's sister. Family Communication: Patient's sister. Disposition Plan: Back to facility when stable. Consults called: None. Admission status: Observation.   Eduard Clos MD [facility] Pager [phone].  If 7PM-7AM, please contact night-coverage [email or web address] Password [facility]  08/07/2020, 8:39 PM

## 2020-08-07 NOTE — ED Provider Notes (Signed)
[facility] Emergency Department Provider Note ____________________________________________   Event Date/Time   First MD Initiated Contact with Patient 08/07/20 1722     (approximate)  I have reviewed the triage vital signs and the nursing notes.  HISTORY  Chief Complaint Shortness of Breath   HPI Anthony Chambers is a 75 y.o. malewho presents to the ED for evaluation of SOB, lethargy.   Chart review indicates patient was evaluated by one of our PAs 3 days ago due to shortness of breath, diagnosed with COPD exacerbation and discharged back to his facility. Baseline history of schizophrenia, COPD and diastolic CHF.  She is on chronic 2 L home O2 at his local SNF.  History of DVT on Xarelto.  Patient presents to the ED via EMS from his local SNF for evaluation of shortness of breath and associated lethargy.  EMS reports that he is more lethargic than normal, he is typically up in bed and speaking whereas he has been lethargic and mute throughout the day today.  They further reports shortness of breath and tachypnea at his facility.  EMS provided Solu-Medrol 125 mg..  History limited due to patient's disorientation and shortness of breath   Past Medical History:  Diagnosis Date   Depression    GERD (gastroesophageal reflux disease)    Hyperlipidemia    Hypertension    Schizophrenia (HCC)    Total occlusion of coronary artery, chronic     Patient Active Problem List   Diagnosis Date Noted   COPD exacerbation (HCC) 08/07/2020   DVT (deep venous thrombosis) (HCC) 10/20/2019   GERD (gastroesophageal reflux disease) 10/20/2019   Hyperlipidemia 10/20/2019   Acute respiratory failure with hypoxia and hypercapnia (HCC) 06/01/2018   PVD (peripheral vascular disease) (HCC) 08/15/2012    History reviewed. No pertinent surgical history.  Prior to Admission medications   Medication Sig Start Date End Date Taking? Authorizing Provider  acetaminophen  (TYLENOL) 325 MG tablet Take by mouth every 4 (four) hours as needed for mild pain or moderate pain.    [provider]  acetaminophen (TYLENOL) 500 MG tablet Take 1,000 mg by mouth at bedtime.    [provider]  albuterol (VENTOLIN HFA) 108 (90 Base) MCG/ACT inhaler Inhale 2 puffs into the lungs every 4 (four) hours as needed for wheezing or shortness of breath.    [provider]  furosemide (LASIX) 40 MG tablet Take 40 mg by mouth daily. 03/24/19   [provider]  guaifenesin (ROBITUSSIN) 100 MG/5ML syrup Take 200 mg by mouth every 6 (six) hours as needed for cough.    [provider]  Ipratropium-Albuterol (COMBIVENT RESPIMAT) 20-100 MCG/ACT AERS respimat Inhale 1 puff into the lungs in the morning, at noon, in the evening, and at bedtime.    [provider]  Melatonin 5 MG TABS Take 5 mg by mouth at bedtime.     [provider]  metoprolol succinate (TOPROL-XL) 25 MG 24 hr tablet Take 25 mg by mouth daily.    [provider]  Multiple Vitamin (MULTIVITAMIN WITH MINERALS) TABS tablet Take 1 tablet by mouth daily.    [provider]  OLANZapine (ZYPREXA) 15 MG tablet Take 30 mg by mouth daily.    [provider]  omeprazole (PRILOSEC) 20 MG capsule Take 20 mg by mouth daily.    [provider]  OXYGEN Inhale 4 L/min into the lungs continuous.    [provider]  PARoxetine (PAXIL-CR) 25 MG 24 hr  tablet Take 25 mg by mouth daily.    [provider]  risperiDONE (RISPERDAL) 1 MG tablet Take 1 mg by mouth 2 (two) times daily.     [provider]  rivaroxaban (XARELTO) 20 MG TABS tablet Take 20 mg by mouth daily.    [provider]  saccharomyces boulardii (FLORASTOR) 250 MG capsule Take 250 mg by mouth 2 (two) times daily.    [provider]  sennosides-docusate sodium (SENOKOT-S) 8.6-50 MG tablet Take 1 tablet by mouth in the morning and at bedtime.     [provider]  simvastatin (ZOCOR) 20 MG tablet Take 20 mg by mouth at bedtime.    [provider]  tamsulosin (FLOMAX) 0.4 MG CAPS capsule Take 1 capsule (0.4 mg total) by mouth daily after supper. 03/27/19   Jerilee Field, MD  triamcinolone cream (KENALOG) 0.5 % Apply 1 application topically 2 (two) times daily.    [provider]    Allergies Heparin  Family History  Problem Relation Age of Onset   Asthma Mother     Social History Social History   Tobacco Use   Smoking status: Former    Years: 40.00    Types: Cigarettes   Smokeless tobacco: Never  Substance Use Topics   Alcohol use: Not Currently   Drug use: Never    Review of Systems  Unable to be accurately assessed due to patient's shortness of breath and disorientation ____________________________________________   PHYSICAL EXAM:  VITAL SIGNS: Vitals:   08/07/20 1722 08/07/20 1853  BP:  135/70  Pulse:  (!) 58  Resp:  17  Temp:    SpO2: 99% 96%    Constitutional: Alert and oriented only to self.  Tachypneic.  Follows commands in all 4 extremities and is able to hold himself forward so I can examine his back without assistance. Eyes: Conjunctivae are normal. PERRL. EOMI. Head: Atraumatic. Nose: No congestion/rhinnorhea. Mouth/Throat: Mucous membranes are moist.  Oropharynx non-erythematous. Neck: No stridor. No cervical spine tenderness to palpation. Cardiovascular: Normal rate, regular rhythm. Grossly normal heart sounds.  Good peripheral circulation. Respiratory: Tachypnea to the mid 20s, no further evidence of distress.  Poor air movement throughout and diffuse expiratory wheezes are present. Gastrointestinal: Soft , nondistended, nontender to palpation. No CVA tenderness. Musculoskeletal: No lower extremity tenderness nor edema.  No joint effusions. No signs of acute trauma. 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 Neurologic:  Normal speech and language. No gross focal neurologic deficits are appreciated.  Skin:  Skin is warm, dry and intact. No rash noted. Psychiatric: Mood and affect are normal. Speech and behavior are normal. ____________________________________________   LABS (all labs ordered are listed, but only abnormal results are displayed)  Labs Reviewed  COMPREHENSIVE METABOLIC PANEL - Abnormal; Notable for the following components:      Result Value   CO2 41 (*)    Glucose, Bld 127 (*)    Calcium 8.3 (*)    Albumin 3.0 (*)    Anion gap 1 (*)    All other components within normal limits  CBC WITH DIFFERENTIAL/PLATELET - Abnormal; Notable for the following components:   RBC 3.98 (*)    Hemoglobin 12.0 (*)    HCT 38.6 (*)    Platelets 128 (*)    All other components within normal limits  BLOOD GAS, ARTERIAL - Abnormal; Notable for the following components:   pCO2 arterial 81 (*)    Bicarbonate 45.8 (*)  Acid-Base Excess 16.5 (*)    All other components within normal limits  BRAIN NATRIURETIC PEPTIDE - Abnormal; Notable for the following components:   B Natriuretic Peptide 377.7 (*)    All other components within normal limits  RESP PANEL BY RT-PCR (FLU A&B, COVID) ARPGX2  MAGNESIUM  PROCALCITONIN  PROCALCITONIN  TROPONIN I (HIGH SENSITIVITY)  TROPONIN I (HIGH SENSITIVITY)   ____________________________________________  12 Lead EKG  Sinus rhythm, rate of 54 bpm.  Normal axis.  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 ____________________________________________  RADIOLOGY  ED MD interpretation: Plain film of the chest reviewed by me with increased interstitial opacities throughout  Official radiology report(s): DG Chest Portable 1 View  Result Date: 08/07/2020 CLINICAL DATA:  Shortness of breath. CHF/COPD exacerbation. Evaluate infiltrate. EXAM: PORTABLE CHEST 1 VIEW COMPARISON:  August 04, 2020 FINDINGS: Coarsened  interstitial markings bilaterally with more focal opacity in the left mid lower lung. Stable cardiomegaly. The hila and mediastinum are unchanged. No pneumothorax. No other acute abnormalities. IMPRESSION: 1. Coarsened interstitium bilaterally with more focal opacity in the left base. An infectious process/pneumonia in the left base is considered most likely. Asymmetric edema is considered less likely. Recommend clinical correlation and attention on follow-up. Electronically Signed   By: Gerome Sam III M.D   On: 08/07/2020 18:07    ____________________________________________   PROCEDURES and INTERVENTIONS  Procedure(s) performed (including Critical Care):  .1-3 Lead EKG Interpretation  Date/Time: 08/07/2020 6:27 PM Performed by: Delton Prairie, MD Authorized by: Delton Prairie, MD     Interpretation: normal     ECG rate:  58   ECG rate assessment: bradycardic     Rhythm: sinus bradycardia     Ectopy: none     Conduction: normal    Medications  ipratropium-albuterol (DUONEB) 0.5-2.5 (3) MG/3ML nebulizer solution 6 mL (6 mLs Nebulization Given 08/07/20 1731)  ipratropium-albuterol (DUONEB) 0.5-2.5 (3) MG/3ML nebulizer solution 3 mL (3 mLs Nebulization Given 08/07/20 1923)  furosemide (LASIX) injection 60 mg (60 mg Intravenous Given 08/07/20 1953)    ____________________________________________   MDM / ED COURSE   75 year old male with COPD and diastolic dysfunction presents to the ED short of breath requiring admission for exacerbations of both of these.  He is hemodynamically stable, but requiring 4-6 L of nasal cannula supplemental O2 for normoxia.  Exam with lethargic patient with no evidence of focal neurologic or vascular deficits, no signs of acute trauma.  First ABG with elevated CO2, but no acidosis, likely contributing factor to his lethargy.  After breathing treatments, airflow improves on pulmonary auscultation and his mental status similarly improved.  While his CXR raises  the question of pneumonia, he has no white count and his procalcitonin is negative, making this quite unlikely.  His clinical picture is more consistent with CO2 narcosis associated with COPD exacerbation and CHF exacerbation, we will admit to medicine for further work-up and management.  Clinical Course as of 08/07/20 2025  Wynelle Link Aug 07, 2020  1900 Went to reassess and patient has ripped off all of his EKG leads, pulse oximetry, ripped out his IV as well as removed his supplemental oxygen.  Replaced his pulse oximeter and noted sats in the mid 60s with a good waveform.  I replaced his open facemask and increased O2 to 10 L, he is actually mentating better than presentation.  Still is wheezing on auscultation, though somewhat improved.  Nurses are about to change over to the night shift, and the nighttime nurse  comes in to assist as I am doing this, and we discussed plan of care [DS]  1955 Discussed the case with hospitalist who agrees to admit. [DS]    Clinical Course User Index [DS] Delton PrairieSmith, Beverlee Wilmarth, MD    ____________________________________________   FINAL CLINICAL IMPRESSION(S) / ED DIAGNOSES  Final diagnoses:  COPD exacerbation (HCC)  Shortness of breath  Acute on chronic diastolic congestive heart failure [facility](HCC)     ED Discharge Orders     None        Isidoro Santillana   Note:  This document was prepared using Dragon voice recognition software and may include unintentional dictation errors.    Delton PrairieSmith, Josean Lycan, MD 08/07/20 2027

## 2020-08-07 NOTE — ED Triage Notes (Signed)
General weakness, copd, sob

## 2020-08-07 NOTE — ED Notes (Signed)
rn walked into room and found md and pt. Pt had IV pulled out on stomach. O2 had been reapplied and pt was able to follow commands and deep breathe to get saturation above 90%. Tele leads reapplied, new IV placed.

## 2020-08-08 ENCOUNTER — Observation Stay (HOSPITAL_BASED_OUTPATIENT_CLINIC_OR_DEPARTMENT_OTHER)
Admit: 2020-08-08 | Discharge: 2020-08-08 | Disposition: A | Discharge status: Home / Self Care | Payer: Medicare Other | Attending: Internal Medicine | Admitting: Internal Medicine

## 2020-08-08 DIAGNOSIS — E785 Hyperlipidemia, unspecified: Secondary | ICD-10-CM | POA: Diagnosis present

## 2020-08-08 DIAGNOSIS — G9341 Metabolic encephalopathy: Secondary | ICD-10-CM | POA: Diagnosis present

## 2020-08-08 DIAGNOSIS — I5033 Acute on chronic diastolic (congestive) heart failure: Secondary | ICD-10-CM

## 2020-08-08 DIAGNOSIS — J9602 Acute respiratory failure with hypercapnia: Secondary | ICD-10-CM | POA: Diagnosis not present

## 2020-08-08 DIAGNOSIS — Z7901 Long term (current) use of anticoagulants: Secondary | ICD-10-CM | POA: Diagnosis not present

## 2020-08-08 DIAGNOSIS — J441 Chronic obstructive pulmonary disease with (acute) exacerbation: Secondary | ICD-10-CM

## 2020-08-08 DIAGNOSIS — R404 Transient alteration of awareness: Secondary | ICD-10-CM | POA: Diagnosis not present

## 2020-08-08 DIAGNOSIS — R0602 Shortness of breath: Secondary | ICD-10-CM | POA: Diagnosis present

## 2020-08-08 DIAGNOSIS — Z743 Need for continuous supervision: Secondary | ICD-10-CM | POA: Diagnosis not present

## 2020-08-08 DIAGNOSIS — F0390 Unspecified dementia without behavioral disturbance: Secondary | ICD-10-CM | POA: Diagnosis present

## 2020-08-08 DIAGNOSIS — K219 Gastro-esophageal reflux disease without esophagitis: Secondary | ICD-10-CM | POA: Diagnosis present

## 2020-08-08 DIAGNOSIS — Z9981 Dependence on supplemental oxygen: Secondary | ICD-10-CM | POA: Diagnosis not present

## 2020-08-08 DIAGNOSIS — D6959 Other secondary thrombocytopenia: Secondary | ICD-10-CM | POA: Diagnosis present

## 2020-08-08 DIAGNOSIS — I739 Peripheral vascular disease, unspecified: Secondary | ICD-10-CM | POA: Diagnosis present

## 2020-08-08 DIAGNOSIS — N400 Enlarged prostate without lower urinary tract symptoms: Secondary | ICD-10-CM | POA: Diagnosis present

## 2020-08-08 DIAGNOSIS — G4733 Obstructive sleep apnea (adult) (pediatric): Secondary | ICD-10-CM | POA: Diagnosis present

## 2020-08-08 DIAGNOSIS — F209 Schizophrenia, unspecified: Secondary | ICD-10-CM | POA: Diagnosis present

## 2020-08-08 DIAGNOSIS — Z9119 Patient's noncompliance with other medical treatment and regimen: Secondary | ICD-10-CM | POA: Diagnosis not present

## 2020-08-08 DIAGNOSIS — E872 Acidosis: Secondary | ICD-10-CM | POA: Diagnosis present

## 2020-08-08 DIAGNOSIS — Z87891 Personal history of nicotine dependence: Secondary | ICD-10-CM | POA: Diagnosis not present

## 2020-08-08 DIAGNOSIS — Z7401 Bed confinement status: Secondary | ICD-10-CM | POA: Diagnosis not present

## 2020-08-08 DIAGNOSIS — J9621 Acute and chronic respiratory failure with hypoxia: Secondary | ICD-10-CM | POA: Diagnosis present

## 2020-08-08 DIAGNOSIS — N179 Acute kidney failure, unspecified: Secondary | ICD-10-CM | POA: Diagnosis present

## 2020-08-08 DIAGNOSIS — E876 Hypokalemia: Secondary | ICD-10-CM | POA: Diagnosis present

## 2020-08-08 DIAGNOSIS — D6489 Other specified anemias: Secondary | ICD-10-CM | POA: Diagnosis present

## 2020-08-08 DIAGNOSIS — Z20822 Contact with and (suspected) exposure to covid-19: Secondary | ICD-10-CM | POA: Diagnosis present

## 2020-08-08 DIAGNOSIS — I110 Hypertensive heart disease with heart failure: Secondary | ICD-10-CM | POA: Diagnosis present

## 2020-08-08 DIAGNOSIS — T502X5A Adverse effect of carbonic-anhydrase inhibitors, benzothiadiazides and other diuretics, initial encounter: Secondary | ICD-10-CM | POA: Diagnosis present

## 2020-08-08 DIAGNOSIS — J9601 Acute respiratory failure with hypoxia: Secondary | ICD-10-CM | POA: Diagnosis not present

## 2020-08-08 DIAGNOSIS — I878 Other specified disorders of veins: Secondary | ICD-10-CM | POA: Diagnosis present

## 2020-08-08 DIAGNOSIS — Z86718 Personal history of other venous thrombosis and embolism: Secondary | ICD-10-CM | POA: Diagnosis not present

## 2020-08-08 HISTORY — DX: Acute and chronic respiratory failure with hypoxia: J96.21

## 2020-08-08 LAB — CBC
HCT: 41.4 % (ref 39.0–52.0)
Hemoglobin: 12.9 g/dL — ABNORMAL LOW (ref 13.0–17.0)
MCH: 30 pg (ref 26.0–34.0)
MCHC: 31.2 g/dL (ref 30.0–36.0)
MCV: 96.3 fL (ref 80.0–100.0)
Platelets: 139 10*3/uL — ABNORMAL LOW (ref 150–400)
RBC: 4.3 MIL/uL (ref 4.22–5.81)
RDW: 12.4 % (ref 11.5–15.5)
WBC: 7.9 10*3/uL (ref 4.0–10.5)
nRBC: 0 % (ref 0.0–0.2)

## 2020-08-08 LAB — BASIC METABOLIC PANEL
Anion gap: 10 (ref 5–15)
BUN: 22 mg/dL (ref 8–23)
CO2: 36 mmol/L — ABNORMAL HIGH (ref 22–32)
Calcium: 8 mg/dL — ABNORMAL LOW (ref 8.9–10.3)
Chloride: 95 mmol/L — ABNORMAL LOW (ref 98–111)
Creatinine, Ser: 0.93 mg/dL (ref 0.61–1.24)
GFR, Estimated: >60 mL/min (ref >60)
Glucose, Bld: 149 mg/dL — ABNORMAL HIGH (ref 70–99)
Potassium: 4.1 mmol/L (ref 3.5–5.1)
Sodium: 141 mmol/L (ref 135–145)

## 2020-08-08 LAB — ECHOCARDIOGRAM COMPLETE
AV Mean grad: 4 mmHg
AV Peak grad: 10.1 mmHg
Ao pk vel: 1.59 m/s
Area-P 1/2: 4.39 cm2

## 2020-08-08 LAB — TSH: TSH: 0.628 u[IU]/mL (ref 0.350–4.500)

## 2020-08-08 LAB — PROCALCITONIN: Procalcitonin: <0.1 ng/mL

## 2020-08-08 LAB — MAGNESIUM: Magnesium: 2.4 mg/dL (ref 1.7–2.4)

## 2020-08-08 MED ORDER — PERFLUTREN LIPID MICROSPHERE
1.0000 mL | INTRAVENOUS | Status: AC | PRN
  Administered 2020-08-08: 2 mL via INTRAVENOUS
  Filled 2020-08-08: qty 10

## 2020-08-08 MED ORDER — IPRATROPIUM-ALBUTEROL 0.5-2.5 (3) MG/3ML IN SOLN
3.0000 mL | Freq: Two times a day (BID) | RESPIRATORY_TRACT | Status: DC
  Administered 2020-08-08 – 2020-08-11 (×6): 3 mL via RESPIRATORY_TRACT
  Filled 2020-08-08 (×6): qty 3

## 2020-08-08 MED ORDER — METHYLPREDNISOLONE SODIUM SUCC 40 MG IJ SOLR
40.0000 mg | Freq: Two times a day (BID) | INTRAMUSCULAR | Status: DC
  Administered 2020-08-08 – 2020-08-11 (×6): 40 mg via INTRAVENOUS
  Filled 2020-08-08 (×6): qty 1

## 2020-08-08 NOTE — Evaluation (Signed)
Clinical/Bedside Swallow Evaluation Patient Details  Name: Anthony Chambers MRN: [id number] Date of Birth: [birth year]  Today's Date: 08/08/2020 Time: SLP Start Time (ACUTE ONLY): 1450 SLP Stop Time (ACUTE ONLY): 1550 SLP Time Calculation (min) (ACUTE ONLY): 60 min  Past Medical History:  Past Medical History:  Diagnosis Date   Depression    GERD (gastroesophageal reflux disease)    Hyperlipidemia    Hypertension    Schizophrenia (HCC)    Total occlusion of coronary artery, chronic    Past Surgical History: History reviewed. No pertinent surgical history. HPI:  Pt is a 75 y.o. male with history of COPD on 4 L oxygen, GERD, chronic CHF, sleep apnea noncompliant with BiPAP, schizophrenia was brought to the ER for the second time in the last 72 hours because of worsening shortness of breath.  As per the report patient has been increasingly short of breath last 4 days.  Did not complain of any chest pain.  Also was increasingly confused.  ED Course: In the ER patient was initially hypoxic requiring 8 L oxygen.  Chest x-ray shows congestion and possible infiltrates.  Labs show high sensitive troponin of 14 BNP of 377 EKG shows normal sinus rhythm.  CBC shows hemoglobin of 12 and platelets of 128 stable when compared to 3 days ago.  COVID test was negative.  Patient was given Lasix 60 mg IV placed on nebulizer and steroids admitted for further management of acute respiratory failure with hypoxia and hypercarbia likely a combination of COPD and CHF.   CXR at admit: Coarsened interstitium bilaterally with more focal opacity in the  left base. An infectious process/pneumonia in the left base is considered most likely.   Assessment / Plan / Recommendation Clinical Impression  Pt appears to present w/ adequate oropharyngeal phase swallow w/ No oropharyngeal phase dysphagia noted, No neuromuscular deficits noted. However, pt is Edentulous at Baseline and has Baseline Cognitive decline, Dementia. He is  Impulsive during self-feeding. These factors can increase risk for choking, aspiration. Pt consumed po trials w/ No overt, clinical s/s of aspiration during po trials. Pt appears at reduced risk for aspiration following general aspiration precautions, support w/ setup at meals, and monitoring during oral intake for the implsive feeding behaviors.   During po trials, pt consumed all consistencies w/ no overt coughing, decline in vocal quality, or change in respiratory presentation during/post trials. Oral phase appeared grossly [facility] w/ timely bolus management, mashing/gumming of softened solids, and control of bolus propulsion for A-P transfer for swallowing. Oral clearing achieved w/ all trial consistencies. Pt was instructed and cued on slowing down and using SMALLER bites of food to lessen risk for choking. OM Exam appeared [facility] w/ no unilateral weakness noted. Speech Clear. Pt fed self w/ setup support.   Recommend a more mech soft consistency diet w/ well-Cut meats, moistened foods; Thin liquids. Recommend general aspiration precautions, Pills WHOLE in Puree for safer, easier swallowing IF any difficulty noted w/ liquids -- d/t Baseline Cognitive decline issue. Education given on Pills in Puree; food consistencies and easy to eat options; general aspiration precautions to NSG/pt/posted in chart/room. Also GERD precautions baseline. NSG to reconsult if any new needs arise. NSG agreed. MD updated. SLP Visit Diagnosis: Dysphagia, oral phase (R13.11) (edentulous; impulsivity)    Aspiration Risk  Mild aspiration risk;Risk for inadequate nutrition/hydration (reduced following general precautions)    Diet Recommendation  mech soft consistency diet w/ well-Cut meats, moistened foods; Thin liquids. Recommend general aspiration precautions, monitoring during meals for  Impulsive feeding behaviors d/t Baseline Cognitive decline. GERD precautions.  Medication Administration: Whole meds with puree (as needed)     Other  Recommendations Recommended Consults:  (Dietician f/u as needed) Oral Care Recommendations: Oral care BID;Oral care before and after PO;Staff/trained caregiver to provide oral care Other Recommendations:  (n/a)   Follow up Recommendations None      Frequency and Duration  (n/a)   (n/a)       Prognosis Prognosis for Safe Diet Advancement: Fair (-Good) Barriers to Reach Goals: Cognitive deficits;Time post onset;Severity of deficits;Behavior (impulsive; decreased insight)      Swallow Study   General Date of Onset: 08/07/20 HPI: Pt is a 75 y.o. male with history of COPD on 4 L oxygen, GERD, chronic CHF, sleep apnea noncompliant with BiPAP, schizophrenia was brought to the ER for the second time in the last 72 hours because of worsening shortness of breath.  As per the report patient has been increasingly short of breath last 4 days.  Did not complain of any chest pain.  Also was increasingly confused.  ED Course: In the ER patient was initially hypoxic requiring 8 L oxygen.  Chest x-ray shows congestion and possible infiltrates.  Labs show high sensitive troponin of 14 BNP of 377 EKG shows normal sinus rhythm.  CBC shows hemoglobin of 12 and platelets of 128 stable when compared to 3 days ago.  COVID test was negative.  Patient was given Lasix 60 mg IV placed on nebulizer and steroids admitted for further management of acute respiratory failure with hypoxia and hypercarbia likely a combination of COPD and CHF.   CXR at admit: Coarsened interstitium bilaterally with more focal opacity in the  left base. An infectious process/pneumonia in the left base is considered most likely. Type of Study: Bedside Swallow Evaluation Previous Swallow Assessment: none Diet Prior to this Study: Regular;Thin liquids Temperature Spikes Noted: No (wbc 7.9) Respiratory Status: Nasal cannula (4L) History of Recent Intubation: No Behavior/Cognition: Alert;Cooperative;Pleasant  mood;Confused;Distractible;Requires cueing Oral Cavity Assessment: Within Functional Limits Oral Care Completed by SLP: Yes Oral Cavity - Dentition: Edentulous Vision: Functional for self-feeding Self-Feeding Abilities: Able to feed self;Needs assist;Needs set up (impulsive) Patient Positioning: Upright in bed (needed positioning) Baseline Vocal Quality: Normal (mumbled at times) Volitional Cough: Strong Volitional Swallow: Unable to elicit    Oral/Motor/Sensory Function Overall Oral Motor/Sensory Function: Within functional limits   Ice Chips Ice chips: Within functional limits Presentation: Spoon (fed; 3 trials)   Thin Liquid Thin Liquid: Within functional limits Presentation: Self Fed;Cup;Straw (~8+ ozs)    Nectar Thick Nectar Thick Liquid: Not tested   Honey Thick Honey Thick Liquid: Not tested   Puree Puree: Within functional limits Presentation: Self Fed;Spoon (4 ozs)   Solid     Solid: Impaired (edentulous) Presentation: Self Fed;Spoon (6 trials) Oral Phase Impairments: Impaired mastication (edentulous) Oral Phase Functional Implications: Impaired mastication Pharyngeal Phase Impairments:  (none)        Jerilynn Som, MS, [facility] [phone] Anthony Chambers 08/08/2020,4:57 PM

## 2020-08-08 NOTE — TOC Initial Note (Signed)
Transition of Care [facility]) - Initial/Assessment Note    Patient Details  Name: Anthony Chambers MRN: [id number] Date of Birth: 08/21/1945  [facility] CM/SW Contact:    Margarito Liner, LCSW Phone Number: 08/08/2020, 2:38 PM  Clinical Narrative:   Received call back from patient's sister, [name]. She confirmed patient is a long-term resident at [facility] and said he has lived there 6-7 years. Mrs. Penne Lash stated the MD had called her about 30 minutes ago and told her he was not doing well. She is asking if she can talk to him on the phone and if they can visit. Asked RN to call her. Will continue to follow progress and facilitate return to [facility] if he stabilizes. No further concerns. CSW encouraged patient's sister to contact CSW as needed. CSW will continue to follow patient and his sister for support and facilitate discharge once medically stable.           Expected Discharge Plan:  (TBD) Barriers to Discharge: Continued Medical Work up   Patient Goals and CMS Choice     Choice offered to / list presented to : NA  Expected Discharge Plan and Services Expected Discharge Plan:  (TBD)     Post Acute Care Choice: Resumption of Svcs/PTA Provider Living arrangements for the past 2 months: Skilled Nursing Facility                                      Prior Living Arrangements/Services Living arrangements for the past 2 months: Skilled Nursing Facility Lives with:: Facility Resident Patient language and need for interpreter reviewed:: Yes Do you feel safe going back to the place where you live?: Yes      Need for Family Participation in Patient Care: Yes (Comment) Care giver support system in place?: Yes (comment)   Criminal Activity/Legal Involvement Pertinent to Current Situation/Hospitalization: No - Comment as needed  Activities of Daily Living      Permission Sought/Granted Permission sought to share information with : Family Supports    Share  Information with NAME: [name]     Permission granted to share info w Relationship: Sister  Permission granted to share info w Contact Information: [phone]  Emotional Assessment Appearance:: Appears stated age Attitude/Demeanor/Rapport: Unable to Assess Affect (typically observed): Unable to Assess Orientation: : Oriented to Self, Oriented to Place Alcohol / Substance Use: Not Applicable Psych Involvement: No (comment)  Admission diagnosis:  Shortness of breath [R06.02] COPD exacerbation (HCC) [J44.1] Acute on chronic diastolic congestive heart failure (HCC) [I50.33] Acute respiratory failure with hypoxia and hypercapnia (HCC) [J96.01, J96.02] Acute on chronic respiratory failure with hypoxia and hypercapnia (HCC) [W25.85, J96.22] Patient Active Problem List   Diagnosis Date Noted   Acute on chronic respiratory failure with hypoxia and hypercapnia (HCC) 08/08/2020   Acute on chronic diastolic CHF (congestive heart failure) (HCC) 08/08/2020   COPD exacerbation (HCC) 08/07/2020   DVT (deep venous thrombosis) (HCC) 10/20/2019   GERD (gastroesophageal reflux disease) 10/20/2019   Hyperlipidemia 10/20/2019   Acute respiratory failure with hypoxia and hypercapnia (HCC) 06/01/2018   PVD (peripheral vascular disease) (HCC) 08/15/2012   PCP:  Collier Bullock., MD Pharmacy:   [facility] - [place], [place] - [address] [place] [address] Phone: [phone] Fax: [phone]     Social Determinants of Health (SDOH) Interventions    Readmission  Risk Interventions Readmission Risk Prevention Plan 06/03/2018  Post Dischage Appt Complete  Medication Screening Complete  Transportation Screening Complete  Some recent data might be hidden

## 2020-08-08 NOTE — Clinical Social Work Note (Addendum)
Left voicemail for patient's sister. Will discuss return to [facility] when she returns call.  Charlynn Court, CSW [phone]

## 2020-08-08 NOTE — Progress Notes (Signed)
*  PRELIMINARY RESULTS* Echocardiogram 2D Echocardiogram has been performed.  Anthony Chambers 08/08/2020, 9:59 AM

## 2020-08-08 NOTE — Progress Notes (Signed)
PROGRESS NOTE    Anthony Chambers  FTD:[id number] DOB: [birth year] DOA: 08/07/2020 PCP: Collier Bullock., MD   Chief complaint.  Shortness of breath. Brief Narrative:  Anthony Chambers is a 75 y.o. male with history of COPD on 4 L oxygen, chronic CHF, sleep apnea noncompliant with BiPAP, schizophrenia was brought to the ER for the second time in the last 72 hours because of worsening shortness of breath.  As per the report patient has been increasingly short of breath last 4 days.  He also has altered mental status with a significant CO2 retention.  He was initially placed on 8 L oxygen.  He diagnosed with a CHF exacerbation and COPD exacerbation.  He is a placed on IV Lasix.   Assessment & Plan:   Principal Problem:   Acute respiratory failure with hypoxia and hypercapnia (HCC) Active Problems:   DVT (deep venous thrombosis) (HCC)   GERD (gastroesophageal reflux disease)   PVD (peripheral vascular disease) (HCC)   COPD exacerbation (HCC)  #1.  Acute respiratory failure with hypoxemia and hypercapnia. Acute on chronic diastolic congestive heart failure. COPD exacerbation. Patient had a significant hypoxemia and hypercapnia at time admission.  This appears to be multifactorial, he has mild volume overload, he also had a significant COPD exacerbation.  Continue IV Lasix, add IV steroids. Another consideration is aspiration.  Will obtain speech therapy evaluation.  No bacterial pneumonia at this time.  2.  Acute metabolic cephalopathy secondary to CO2 retention. Schizophrenia. Baseline dementia. Discussed with patient's sister, patient has baseline dementia, mental status has been deteriorating recently.  Worsening confusion due to CO2 retention.  Continue to monitor.  3.  Obstructive sleep apnea. Patient has not been compliant to BiPAP.  #4.  Anemia with thrombocytopenia. Continue to follow.  #5.  CODE STATUS. 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  #6.  History of DVT. On Xarelto.       DVT prophylaxis: Xarelto Code Status: ?  Family Communication: Sister updated. Disposition Plan:    Status is: Observation  The patient will require care spanning > 2 midnights and should be moved to inpatient because: IV treatments appropriate due to intensity of illness or inability to take PO and Inpatient level of care appropriate due to severity of illness  Dispo: The patient is from: SNF              Anticipated d/c is to: SNF              Patient currently is not medically stable to d/c.   Difficult to place patient No        I/O last 3 completed shifts: In: -  Out: 600 [Urine:600] No intake/output data recorded.     Consultants:  None  Procedures: None  Antimicrobials: None  Subjective: Patient has significant confusion, no agitation.  He is off BiPAP, currently on 5 L oxygen.  He has shortness of breath with exertion. No abdominal pain or nausea vomiting. No dysuria hematuria. No fever or chills.  Objective: Vitals:   08/07/20 2117 08/07/20 2235 08/08/20 0517 08/08/20 0757  BP: 135/72 136/63 (!) 123/51 138/62  Pulse: (!) 52 78 68 67  Resp: (!) 21 16    Temp: 98.6 F (37 C) 98 F (36.7 C) 97.8 F (36.6 C) 97.7 F (36.5 C)  TempSrc: Oral Oral Oral Oral  SpO2: 93% 91% 91% 91%  Intake/Output Summary (Last 24 hours) at 08/08/2020 1326 Last data filed at 08/08/2020 [id number] Gross per 24 hour  Intake --  Output 600 ml  Net -600 ml   There were no vitals filed for this visit.  Examination:  General exam: Appears calm and comfortable  Respiratory system: Decreased breathing sounds. Respiratory effort normal. Cardiovascular system: S1 & S2 heard, RRR. No JVD, murmurs, rubs, gallops or clicks. No pedal edema. Gastrointestinal system: Abdomen is nondistended, soft and nontender. No organomegaly or masses felt. Normal bowel sounds heard. Central nervous  system: Alert and oriented x2. No focal neurological deficits. Extremities: Symmetric 5 x 5 power. Skin: No rashes, lesions or ulcers Psychiatry: Mood & affect appropriate.     Data Reviewed: I have personally reviewed following labs and imaging studies  CBC: Recent Labs  Lab 08/04/20 1927 08/07/20 1727 08/08/20 0403  WBC 5.4 6.1 7.9  NEUTROABS 4.5 3.9  --   HGB 12.6* 12.0* 12.9*  HCT 40.1 38.6* 41.4  MCV 93.9 97.0 96.3  PLT 135* 128* 139*   Basic Metabolic Panel: Recent Labs  Lab 08/04/20 1927 08/07/20 1727 08/08/20 0403  NA 141 141 141  K 3.8 4.1 4.1  CL 96* 99 95*  CO2 39* 41* 36*  GLUCOSE 111* 127* 149*  BUN 21 20 22   CREATININE 0.95 0.98 0.93  CALCIUM 8.1* 8.3* 8.0*  MG  --  2.4  --    GFR: Estimated Creatinine Clearance: 94.8 mL/min (by C-G formula based on SCr of 0.93 mg/dL). Liver Function Tests: Recent Labs  Lab 08/07/20 1727  AST 16  ALT 12  ALKPHOS 66  BILITOT 0.4  PROT 6.5  ALBUMIN 3.0*   No results for input(s): LIPASE, AMYLASE in the last 168 hours. No results for input(s): AMMONIA in the last 168 hours. Coagulation Profile: No results for input(s): INR, PROTIME in the last 168 hours. Cardiac Enzymes: No results for input(s): CKTOTAL, CKMB, CKMBINDEX, TROPONINI in the last 168 hours. BNP (last 3 results) No results for input(s): PROBNP in the last 8760 hours. HbA1C: No results for input(s): HGBA1C in the last 72 hours. CBG: No results for input(s): GLUCAP in the last 168 hours. Lipid Profile: No results for input(s): CHOL, HDL, LDLCALC, TRIG, CHOLHDL, LDLDIRECT in the last 72 hours. Thyroid Function Tests: Recent Labs    08/08/20 0403  TSH 0.628   Anemia Panel: No results for input(s): VITAMINB12, FOLATE, FERRITIN, TIBC, IRON, RETICCTPCT in the last 72 hours. Sepsis Labs: Recent Labs  Lab 08/07/20 1825 08/08/20 0403  PROCALCITON <0.10 <0.10    Recent Results (from the past 240 hour(s))  Resp Panel by RT-PCR (Flu A&B,  Covid) Nasopharyngeal Swab     Status: None   Collection Time: 08/04/20  7:37 PM   Specimen: Nasopharyngeal Swab; Nasopharyngeal(NP) swabs in vial transport medium  Result Value Ref Range Status   SARS Coronavirus 2 by RT PCR NEGATIVE NEGATIVE Final    Comment: (NOTE) SARS-CoV-2 target nucleic acids are NOT DETECTED.  The SARS-CoV-2 RNA is generally detectable in upper respiratory specimens during the acute phase of infection. The lowest concentration of SARS-CoV-2 viral copies this assay can detect is 138 copies/mL. A negative result does not preclude SARS-Cov-2 infection and should not be used as the sole basis for treatment or other patient management decisions. A negative result may occur with  improper specimen collection/handling, submission of specimen other than nasopharyngeal swab, presence of viral mutation(s) within the areas targeted by this assay, and inadequate number of viral  copies(<138 copies/mL). A negative result must be combined with clinical observations, patient history, and epidemiological information. The expected result is Negative.  Fact Sheet for Patients:  BloggerCourse.com  Fact Sheet for Healthcare Providers:  SeriousBroker.it  This test is no t yet approved or cleared by the [place] FDA and  has been authorized for detection and/or diagnosis of SARS-CoV-2 by FDA under an Emergency Use Authorization (EUA). This EUA will remain  in effect (meaning this test can be used) for the duration of the COVID-19 declaration under Section 564(b)(1) of the Act, 21 U.S.C.section 360bbb-3(b)(1), unless the authorization is terminated  or revoked sooner.       Influenza A by PCR NEGATIVE NEGATIVE Final   Influenza B by PCR NEGATIVE NEGATIVE Final    Comment: (NOTE) The Xpert Xpress SARS-CoV-2/FLU/RSV plus assay is intended as an aid in the diagnosis of influenza from Nasopharyngeal swab specimens and should  not be used as a sole basis for treatment. Nasal washings and aspirates are unacceptable for Xpert Xpress SARS-CoV-2/FLU/RSV testing.  Fact Sheet for Patients: BloggerCourse.com  Fact Sheet for Healthcare Providers: SeriousBroker.it  This test is not yet approved or cleared by the [place] FDA and has been authorized for detection and/or diagnosis of SARS-CoV-2 by FDA under an Emergency Use Authorization (EUA). This EUA will remain in effect (meaning this test can be used) for the duration of the COVID-19 declaration under Section 564(b)(1) of the Act, 21 U.S.C. section 360bbb-3(b)(1), unless the authorization is terminated or revoked.  Performed at [facility], [address] [place], [place] [address]   Resp Panel by RT-PCR (Flu A&B, Covid) Nasopharyngeal Swab     Status: None   Collection Time: 08/07/20  5:27 PM   Specimen: Nasopharyngeal Swab; Nasopharyngeal(NP) swabs in vial transport medium  Result Value Ref Range Status   SARS Coronavirus 2 by RT PCR NEGATIVE NEGATIVE Final    Comment: (NOTE) SARS-CoV-2 target nucleic acids are NOT DETECTED.  The SARS-CoV-2 RNA is generally detectable in upper respiratory specimens during the acute phase of infection. The lowest concentration of SARS-CoV-2 viral copies this assay can detect is 138 copies/mL. A negative result does not preclude SARS-Cov-2 infection and should not be used as the sole basis for treatment or other patient management decisions. A negative result may occur with  improper specimen collection/handling, submission of specimen other than nasopharyngeal swab, presence of viral mutation(s) within the areas targeted by this assay, and inadequate number of viral copies(<138 copies/mL). A negative result must be combined with clinical observations, patient history, and epidemiological information. The expected result is Negative.  Fact Sheet for  Patients:  BloggerCourse.com  Fact Sheet for Healthcare Providers:  SeriousBroker.it  This test is no t yet approved or cleared by the [place] FDA and  has been authorized for detection and/or diagnosis of SARS-CoV-2 by FDA under an Emergency Use Authorization (EUA). This EUA will remain  in effect (meaning this test can be used) for the duration of the COVID-19 declaration under Section 564(b)(1) of the Act, 21 U.S.C.section 360bbb-3(b)(1), unless the authorization is terminated  or revoked sooner.       Influenza A by PCR NEGATIVE NEGATIVE Final   Influenza B by PCR NEGATIVE NEGATIVE Final    Comment: (NOTE) The Xpert Xpress SARS-CoV-2/FLU/RSV plus assay is intended as an aid in the diagnosis of influenza from Nasopharyngeal swab specimens and should not be used as a sole basis for treatment. Nasal washings and aspirates are unacceptable for Xpert  Xpress SARS-CoV-2/FLU/RSV testing.  Fact Sheet for Patients: BloggerCourse.com  Fact Sheet for Healthcare Providers: SeriousBroker.it  This test is not yet approved or cleared by the [place] FDA and has been authorized for detection and/or diagnosis of SARS-CoV-2 by FDA under an Emergency Use Authorization (EUA). This EUA will remain in effect (meaning this test can be used) for the duration of the COVID-19 declaration under Section 564(b)(1) of the Act, 21 U.S.C. section 360bbb-3(b)(1), unless the authorization is terminated or revoked.  Performed at [facility], [address], [place], [place] [address]          Radiology Studies: CT HEAD WO CONTRAST  Result Date: 08/07/2020 CLINICAL DATA:  Altered mental status. EXAM: CT HEAD WITHOUT CONTRAST TECHNIQUE: Contiguous axial images were obtained from the base of the skull through the vertex without intravenous contrast. COMPARISON:  None. FINDINGS:  Brain: No evidence of acute infarction, hemorrhage, hydrocephalus, extra-axial collection, or mass lesion/mass effect. Severe chronic small vessel disease is noted. Vascular:  No hyperdense vessel or other acute findings. Skull: No evidence of fracture or other significant bone abnormality. Sinuses/Orbits:  No acute findings. Other: None. IMPRESSION: No acute intracranial abnormality. Severe chronic small vessel disease. Electronically Signed   By: Danae Orleans M.D.   On: 08/07/2020 20:38   DG Chest Portable 1 View  Result Date: 08/07/2020 CLINICAL DATA:  Shortness of breath. CHF/COPD exacerbation. Evaluate infiltrate. EXAM: PORTABLE CHEST 1 VIEW COMPARISON:  August 04, 2020 FINDINGS: Coarsened interstitial markings bilaterally with more focal opacity in the left mid lower lung. Stable cardiomegaly. The hila and mediastinum are unchanged. No pneumothorax. No other acute abnormalities. IMPRESSION: 1. Coarsened interstitium bilaterally with more focal opacity in the left base. An infectious process/pneumonia in the left base is considered most likely. Asymmetric edema is considered less likely. Recommend clinical correlation and attention on follow-up. Electronically Signed   By: Gerome Sam III M.D   On: 08/07/2020 18:07   ECHOCARDIOGRAM COMPLETE  Result Date: 08/08/2020    ECHOCARDIOGRAM REPORT   Patient Name:   AABAN GRIEP Date of Exam: 08/08/2020 Medical Rec #:  [id number]      Height:       74.0 in Accession #:    [id number]     Weight:       250.0 lb Date of Birth:  1945-01-25      BSA:          2.390 m Patient Age:    75 years       BP:           138/62 mmHg Patient Gender: M              HR:           72 bpm. Exam Location:  [facility] Procedure: 2D Echo, Limited Color Doppler, Cardiac Doppler and Intracardiac            Opacification Agent Indications:     I50.9 Congestive Heart Failure  History:         Patient has prior history of Echocardiogram examinations. Risk                  Factors:Hypertension  and Dyslipidemia. Schizophrenia.  Sonographer:     Humphrey Rolls RDCS (AE) Referring Phys:  [id number] [facility] Diagnosing Phys: Lorine Bears MD  Sonographer Comments: Technically challenging study due to limited acoustic windows and Technically difficult study due to poor echo windows. Image acquisition challenging due to patient body habitus.  IMPRESSIONS  1. Left ventricular ejection fraction, by estimation, is 55 to 60%. The left ventricle has normal function. Left ventricular endocardial border not optimally defined to evaluate regional wall motion. Left ventricular diastolic parameters are consistent with Grade I diastolic dysfunction (impaired relaxation).  2. Right ventricular systolic function is normal. The right ventricular size is normal. Tricuspid regurgitation signal is inadequate for assessing PA pressure.  3. The mitral valve was not well visualized. No evidence of mitral valve regurgitation. No evidence of mitral stenosis.  4. The aortic valve was not well visualized. Aortic valve regurgitation is not visualized. No aortic stenosis is present.  5. Challenging image quality. FINDINGS  Left Ventricle: Left ventricular ejection fraction, by estimation, is 55 to 60%. The left ventricle has normal function. Left ventricular endocardial border not optimally defined to evaluate regional wall motion. Definity contrast agent was given IV to delineate the left ventricular endocardial borders. The left ventricular internal cavity size was normal in size. There is no left ventricular hypertrophy. Left ventricular diastolic parameters are consistent with Grade I diastolic dysfunction (impaired relaxation). Right Ventricle: The right ventricular size is normal. No increase in right ventricular wall thickness. Right ventricular systolic function is normal. Tricuspid regurgitation signal is inadequate for assessing PA pressure. Left Atrium: Left atrial size was normal in size. Right Atrium: Right atrial size was  normal in size. Pericardium: There is no evidence of pericardial effusion. Mitral Valve: The mitral valve was not well visualized. No evidence of mitral valve regurgitation. No evidence of mitral valve stenosis. MV peak gradient, 6.0 mmHg. The mean mitral valve gradient is 3.0 mmHg. Tricuspid Valve: The tricuspid valve is not well visualized. Tricuspid valve regurgitation is not demonstrated. No evidence of tricuspid stenosis. Aortic Valve: The aortic valve was not well visualized. Aortic valve regurgitation is not visualized. No aortic stenosis is present. Aortic valve mean gradient measures 4.0 mmHg. Aortic valve peak gradient measures 10.1 mmHg. Pulmonic Valve: The pulmonic valve was normal in structure. Pulmonic valve regurgitation is not visualized. No evidence of pulmonic stenosis. Aorta: The aortic root is normal in size and structure. Venous: The inferior vena cava was not well visualized. IAS/Shunts: No atrial level shunt detected by color flow Doppler.   Diastology LV e' medial:    9.03 cm/s LV E/e' medial:  7.7 LV e' lateral:   7.72 cm/s LV E/e' lateral: 9.0  AORTIC VALVE AV Vmax:           159.00 cm/s AV Vmean:          94.500 cm/s AV VTI:            0.331 m AV Peak Grad:      10.1 mmHg AV Mean Grad:      4.0 mmHg LVOT Vmax:         107.00 cm/s LVOT Vmean:        73.000 cm/s LVOT VTI:          0.231 m LVOT/AV VTI ratio: 0.70 MITRAL VALVE MV Area (PHT): 4.39 cm    SHUNTS MV Peak grad:  6.0 mmHg    Systemic VTI: 0.23 m MV Mean grad:  3.0 mmHg MV Vmax:       1.22 m/s MV Vmean:      78.7 cm/s MV Decel Time: 173 msec MV E velocity: 69.40 cm/s MV A velocity: 93.40 cm/s MV E/A ratio:  0.74 Lorine Bears MD Electronically signed by Lorine Bears MD Signature Date/Time: 08/08/2020/10:16:10 AM    Final  Scheduled Meds:  budesonide (PULMICORT) nebulizer solution  0.25 mg Nebulization BID   furosemide  40 mg Intravenous Q12H   ipratropium-albuterol  3 mL Nebulization BID   metoprolol succinate  25  mg Oral Daily   OLANZapine  30 mg Oral Daily   pantoprazole  40 mg Oral Daily   risperiDONE  1 mg Oral BID   rivaroxaban  20 mg Oral Daily   simvastatin  20 mg Oral QHS   tamsulosin  0.4 mg Oral QPC supper   Continuous Infusions:   LOS: 0 days    Time spent: 32 minutes, more than 50% time involved in direct patient care    Marrion Coy, MD [facility]   To contact the attending provider between 7A-7P or the covering provider during after hours 7P-7A, please log into the web site [email or web address] and access using universal [place] password for that web site. If you do not have the password, please call the hospital operator.  08/08/2020, 1:26 PM

## 2020-08-09 DIAGNOSIS — J9602 Acute respiratory failure with hypercapnia: Secondary | ICD-10-CM | POA: Diagnosis not present

## 2020-08-09 DIAGNOSIS — J441 Chronic obstructive pulmonary disease with (acute) exacerbation: Secondary | ICD-10-CM | POA: Diagnosis not present

## 2020-08-09 DIAGNOSIS — E876 Hypokalemia: Secondary | ICD-10-CM

## 2020-08-09 DIAGNOSIS — I5033 Acute on chronic diastolic (congestive) heart failure: Secondary | ICD-10-CM | POA: Diagnosis not present

## 2020-08-09 DIAGNOSIS — J9601 Acute respiratory failure with hypoxia: Secondary | ICD-10-CM | POA: Diagnosis not present

## 2020-08-09 LAB — CBC WITH DIFFERENTIAL/PLATELET
Abs Immature Granulocytes: 0.07 10*3/uL (ref 0.00–0.07)
Basophils Absolute: 0 10*3/uL (ref 0.0–0.1)
Basophils Relative: 0 %
Eosinophils Absolute: 0 10*3/uL (ref 0.0–0.5)
Eosinophils Relative: 0 %
HCT: 39.8 % (ref 39.0–52.0)
Hemoglobin: 12.9 g/dL — ABNORMAL LOW (ref 13.0–17.0)
Immature Granulocytes: 1 %
Lymphocytes Relative: 8 %
Lymphs Abs: 0.7 10*3/uL (ref 0.7–4.0)
MCH: 29.6 pg (ref 26.0–34.0)
MCHC: 32.4 g/dL (ref 30.0–36.0)
MCV: 91.3 fL (ref 80.0–100.0)
Monocytes Absolute: 0.3 10*3/uL (ref 0.1–1.0)
Monocytes Relative: 3 %
Neutro Abs: 7.9 10*3/uL — ABNORMAL HIGH (ref 1.7–7.7)
Neutrophils Relative %: 88 %
Platelets: 177 10*3/uL (ref 150–400)
RBC: 4.36 MIL/uL (ref 4.22–5.81)
RDW: 12.3 % (ref 11.5–15.5)
WBC: 9 10*3/uL (ref 4.0–10.5)
nRBC: 0 % (ref 0.0–0.2)

## 2020-08-09 LAB — BASIC METABOLIC PANEL
Anion gap: 9 (ref 5–15)
BUN: 30 mg/dL — ABNORMAL HIGH (ref 8–23)
CO2: 37 mmol/L — ABNORMAL HIGH (ref 22–32)
Calcium: 8.2 mg/dL — ABNORMAL LOW (ref 8.9–10.3)
Chloride: 93 mmol/L — ABNORMAL LOW (ref 98–111)
Creatinine, Ser: 1 mg/dL (ref 0.61–1.24)
GFR, Estimated: >60 mL/min (ref >60)
Glucose, Bld: 126 mg/dL — ABNORMAL HIGH (ref 70–99)
Potassium: 3.4 mmol/L — ABNORMAL LOW (ref 3.5–5.1)
Sodium: 139 mmol/L (ref 135–145)

## 2020-08-09 MED ORDER — TORSEMIDE 20 MG PO TABS
40.0000 mg | ORAL_TABLET | Freq: Two times a day (BID) | ORAL | Status: DC
  Administered 2020-08-09: 40 mg via ORAL
  Filled 2020-08-09 (×2): qty 2

## 2020-08-09 MED ORDER — POTASSIUM CHLORIDE 20 MEQ PO PACK
40.0000 meq | PACK | ORAL | Status: AC
  Administered 2020-08-09 (×2): 40 meq via ORAL
  Filled 2020-08-09 (×2): qty 2

## 2020-08-09 NOTE — Progress Notes (Signed)
PROGRESS NOTE    Anthony BastDanny C Lorio  ZOX:[id number] DOB: 06/07/1945 DOA: 08/07/2020 PCP: Collier BullockHester, Willie E Jr., MD   Chief complaint: shortness of breath. Brief Narrative:  Anthony Chambers is a 75 y.o. male with history of COPD on 4 L oxygen, chronic CHF, sleep apnea noncompliant with BiPAP, schizophrenia was brought to the ER for the second time in the last 72 hours because of worsening shortness of breath.  As per the report patient has been increasingly short of breath last 4 days.  He also has altered mental status with a significant CO2 retention.  He was initially placed on 8 L oxygen.  He diagnosed with a CHF exacerbation and COPD exacerbation.  He is a placed on IV Lasix and IV steroids.   Assessment & Plan:   Principal Problem:   Acute respiratory failure with hypoxia and hypercapnia (HCC) Active Problems:   DVT (deep venous thrombosis) (HCC)   GERD (gastroesophageal reflux disease)   PVD (peripheral vascular disease) (HCC)   COPD exacerbation (HCC)   Acute on chronic respiratory failure with hypoxia and hypercapnia (HCC)   Acute on chronic diastolic CHF (congestive heart failure) (HCC)  #1.  Acute respiratory failure with hypoxemia and hypercapnia. Acute on chronic diastolic congestive heart failure. COPD exacerbation. Patient has been seen by speech therapy, no significant aspiration risk. Volume status much better, change IV Lasix to oral torsemide twice a day. Continue IV steroids for another day, change to oral tomorrow. No evidence of bacterial pneumonia. Echocardiogram showed ejection fraction 60 to 65% with grade 1 diastolic dysfunction.  2.  Acute metabolic encephalopathy secondary to CO2 retention. Schizophrenia. Baseline dementia. Patient condition improving, mental status has improved.  #3.  Obstructive sleep apnea. Patient not compliant with BiPAP.  4.  Anemia and thrombocytopenia. Conditions are stable.  5.  History of DVT. Xarelto.  #6.   Hypokalemia Repleted.  DVT prophylaxis: Xarelto Code Status: full Family Communication:  Disposition Plan:    Status is: Inpatient  Remains inpatient appropriate because:IV treatments appropriate due to intensity of illness or inability to take PO and Inpatient level of care appropriate due to severity of illness  Dispo: The patient is from: SNF              Anticipated d/c is to: SNF              Patient currently is not medically stable to d/c.   Difficult to place patient No        I/O last 3 completed shifts: In: 480 [P.O.:480] Out: 3700 [Urine:3700] Total I/O In: -  Out: 700 [Urine:700]     Consultants:  None  Procedures: None  Antimicrobials: None  Subjective: Patient still has significant short of breath, hypoxia has improved. Cough, nonproductive. No fever or chills. No dysuria hematuria No chest pain palpitation.   Objective: Vitals:   08/08/20 2006 08/09/20 0514 08/09/20 0744 08/09/20 0748  BP: 118/66 132/62  135/68  Pulse: 62 (!) 56  (!) 58  Resp: 18   18  Temp: 98.4 F (36.9 C) 97.8 F (36.6 C)  98.6 F (37 C)  TempSrc: Oral Oral    SpO2: 91% 90% 90% 93%    Intake/Output Summary (Last 24 hours) at 08/09/2020 1311 Last data filed at 08/09/2020 0800 Gross per 24 hour  Intake 480 ml  Output 3800 ml  Net -3320 ml   There were no vitals filed for this visit.  Examination:  General exam: Appears calm and comfortable  Respiratory system: Decreased breathing sounds without crackles wheezes.  Respiratory effort normal. Cardiovascular system: S1 & S2 heard, RRR. No JVD, murmurs, rubs, gallops or clicks. No pedal edema. Gastrointestinal system: Abdomen is nondistended, soft and nontender. No organomegaly or masses felt. Normal bowel sounds heard. Central nervous system: Alert and oriented x2. No focal neurological deficits. Extremities: Symmetric  Skin: No rashes, lesions or ulcers Psychiatry: Mood & affect appropriate.     Data  Reviewed: I have personally reviewed following labs and imaging studies  CBC: Recent Labs  Lab 08/04/20 1927 08/07/20 1727 08/08/20 0403 08/09/20 0602  WBC 5.4 6.1 7.9 9.0  NEUTROABS 4.5 3.9  --  7.9*  HGB 12.6* 12.0* 12.9* 12.9*  HCT 40.1 38.6* 41.4 39.8  MCV 93.9 97.0 96.3 91.3  PLT 135* 128* 139* 177   Basic Metabolic Panel: Recent Labs  Lab 08/04/20 1927 08/07/20 1727 08/08/20 0403 08/09/20 0602  NA 141 141 141 139  K 3.8 4.1 4.1 3.4*  CL 96* 99 95* 93*  CO2 39* 41* 36* 37*  GLUCOSE 111* 127* 149* 126*  BUN 21 20 22  30*  CREATININE 0.95 0.98 0.93 1.00  CALCIUM 8.1* 8.3* 8.0* 8.2*  MG  --  2.4 2.4  --    GFR: Estimated Creatinine Clearance: 88.1 mL/min (by C-G formula based on SCr of 1 mg/dL). Liver Function Tests: Recent Labs  Lab 08/07/20 1727  AST 16  ALT 12  ALKPHOS 66  BILITOT 0.4  PROT 6.5  ALBUMIN 3.0*   No results for input(s): LIPASE, AMYLASE in the last 168 hours. No results for input(s): AMMONIA in the last 168 hours. Coagulation Profile: No results for input(s): INR, PROTIME in the last 168 hours. Cardiac Enzymes: No results for input(s): CKTOTAL, CKMB, CKMBINDEX, TROPONINI in the last 168 hours. BNP (last 3 results) No results for input(s): PROBNP in the last 8760 hours. HbA1C: No results for input(s): HGBA1C in the last 72 hours. CBG: No results for input(s): GLUCAP in the last 168 hours. Lipid Profile: No results for input(s): CHOL, HDL, LDLCALC, TRIG, CHOLHDL, LDLDIRECT in the last 72 hours. Thyroid Function Tests: Recent Labs    08/08/20 0403  TSH 0.628   Anemia Panel: No results for input(s): VITAMINB12, FOLATE, FERRITIN, TIBC, IRON, RETICCTPCT in the last 72 hours. Sepsis Labs: Recent Labs  Lab 08/07/20 1825 08/08/20 0403  PROCALCITON <0.10 <0.10    Recent Results (from the past 240 hour(s))  Resp Panel by RT-PCR (Flu A&B, Covid) Nasopharyngeal Swab     Status: None   Collection Time: 08/04/20  7:37 PM   Specimen:  Nasopharyngeal Swab; Nasopharyngeal(NP) swabs in vial transport medium  Result Value Ref Range Status   SARS Coronavirus 2 by RT PCR NEGATIVE NEGATIVE Final    Comment: (NOTE) SARS-CoV-2 target nucleic acids are NOT DETECTED.  The SARS-CoV-2 RNA is generally detectable in upper respiratory specimens during the acute phase of infection. The lowest concentration of SARS-CoV-2 viral copies this assay can detect is 138 copies/mL. A negative result does not preclude SARS-Cov-2 infection and should not be used as the sole basis for treatment or other patient management decisions. A negative result may occur with  improper specimen collection/handling, submission of specimen other than nasopharyngeal swab, presence of viral mutation(s) within the areas targeted by this assay, and inadequate number of viral copies(<138 copies/mL). A negative result must be combined with clinical observations, patient history, and epidemiological information. The expected result is Negative.  Fact Sheet for Patients:  08/06/20  Fact Sheet for Healthcare Providers:  SeriousBroker.it  This test is no t yet approved or cleared by the [place] FDA and  has been authorized for detection and/or diagnosis of SARS-CoV-2 by FDA under an Emergency Use Authorization (EUA). This EUA will remain  in effect (meaning this test can be used) for the duration of the COVID-19 declaration under Section 564(b)(1) of the Act, 21 U.S.C.section 360bbb-3(b)(1), unless the authorization is terminated  or revoked sooner.       Influenza A by PCR NEGATIVE NEGATIVE Final   Influenza B by PCR NEGATIVE NEGATIVE Final    Comment: (NOTE) The Xpert Xpress SARS-CoV-2/FLU/RSV plus assay is intended as an aid in the diagnosis of influenza from Nasopharyngeal swab specimens and should not be used as a sole basis for treatment. Nasal washings and aspirates are unacceptable for  Xpert Xpress SARS-CoV-2/FLU/RSV testing.  Fact Sheet for Patients: BloggerCourse.com  Fact Sheet for Healthcare Providers: SeriousBroker.it  This test is not yet approved or cleared by the [place] FDA and has been authorized for detection and/or diagnosis of SARS-CoV-2 by FDA under an Emergency Use Authorization (EUA). This EUA will remain in effect (meaning this test can be used) for the duration of the COVID-19 declaration under Section 564(b)(1) of the Act, 21 U.S.C. section 360bbb-3(b)(1), unless the authorization is terminated or revoked.  Performed at [facility], [address] [place], [place] [address]   Resp Panel by RT-PCR (Flu A&B, Covid) Nasopharyngeal Swab     Status: None   Collection Time: 08/07/20  5:27 PM   Specimen: Nasopharyngeal Swab; Nasopharyngeal(NP) swabs in vial transport medium  Result Value Ref Range Status   SARS Coronavirus 2 by RT PCR NEGATIVE NEGATIVE Final    Comment: (NOTE) SARS-CoV-2 target nucleic acids are NOT DETECTED.  The SARS-CoV-2 RNA is generally detectable in upper respiratory specimens during the acute phase of infection. The lowest concentration of SARS-CoV-2 viral copies this assay can detect is 138 copies/mL. A negative result does not preclude SARS-Cov-2 infection and should not be used as the sole basis for treatment or other patient management decisions. A negative result may occur with  improper specimen collection/handling, submission of specimen other than nasopharyngeal swab, presence of viral mutation(s) within the areas targeted by this assay, and inadequate number of viral copies(<138 copies/mL). A negative result must be combined with clinical observations, patient history, and epidemiological information. The expected result is Negative.  Fact Sheet for Patients:  BloggerCourse.com  Fact Sheet for Healthcare Providers:   SeriousBroker.it  This test is no t yet approved or cleared by the [place] FDA and  has been authorized for detection and/or diagnosis of SARS-CoV-2 by FDA under an Emergency Use Authorization (EUA). This EUA will remain  in effect (meaning this test can be used) for the duration of the COVID-19 declaration under Section 564(b)(1) of the Act, 21 U.S.C.section 360bbb-3(b)(1), unless the authorization is terminated  or revoked sooner.       Influenza A by PCR NEGATIVE NEGATIVE Final   Influenza B by PCR NEGATIVE NEGATIVE Final    Comment: (NOTE) The Xpert Xpress SARS-CoV-2/FLU/RSV plus assay is intended as an aid in the diagnosis of influenza from Nasopharyngeal swab specimens and should not be used as a sole basis for treatment. Nasal washings and aspirates are unacceptable for Xpert Xpress SARS-CoV-2/FLU/RSV testing.  Fact Sheet for Patients: BloggerCourse.com  Fact Sheet for Healthcare Providers: SeriousBroker.it  This test is not yet approved or cleared by the [place]  FDA and has been authorized for detection and/or diagnosis of SARS-CoV-2 by FDA under an Emergency Use Authorization (EUA). This EUA will remain in effect (meaning this test can be used) for the duration of the COVID-19 declaration under Section 564(b)(1) of the Act, 21 U.S.C. section 360bbb-3(b)(1), unless the authorization is terminated or revoked.  Performed at [facility], [address] [place], [place] [address]          Radiology Studies: CT HEAD WO CONTRAST  Result Date: 08/07/2020 CLINICAL DATA:  Altered mental status. EXAM: CT HEAD WITHOUT CONTRAST TECHNIQUE: Contiguous axial images were obtained from the base of the skull through the vertex without intravenous contrast. COMPARISON:  None. FINDINGS: Brain: No evidence of acute infarction, hemorrhage, hydrocephalus, extra-axial collection, or mass  lesion/mass effect. Severe chronic small vessel disease is noted. Vascular:  No hyperdense vessel or other acute findings. Skull: No evidence of fracture or other significant bone abnormality. Sinuses/Orbits:  No acute findings. Other: None. IMPRESSION: No acute intracranial abnormality. Severe chronic small vessel disease. Electronically Signed   By: Danae Orleans M.D.   On: 08/07/2020 20:38   DG Chest Portable 1 View  Result Date: 08/07/2020 CLINICAL DATA:  Shortness of breath. CHF/COPD exacerbation. Evaluate infiltrate. EXAM: PORTABLE CHEST 1 VIEW COMPARISON:  August 04, 2020 FINDINGS: Coarsened interstitial markings bilaterally with more focal opacity in the left mid lower lung. Stable cardiomegaly. The hila and mediastinum are unchanged. No pneumothorax. No other acute abnormalities. IMPRESSION: 1. Coarsened interstitium bilaterally with more focal opacity in the left base. An infectious process/pneumonia in the left base is considered most likely. Asymmetric edema is considered less likely. Recommend clinical correlation and attention on follow-up. Electronically Signed   By: Gerome Sam III M.D   On: 08/07/2020 18:07   ECHOCARDIOGRAM COMPLETE  Result Date: 08/08/2020    ECHOCARDIOGRAM REPORT   Patient Name:   HEYWOOD TOKUNAGA Date of Exam: 08/08/2020 Medical Rec #:  [id number]      Height:       74.0 in Accession #:    [id number]     Weight:       250.0 lb Date of Birth:  08-21-1945      BSA:          2.390 m Patient Age:    75 years       BP:           138/62 mmHg Patient Gender: M              HR:           72 bpm. Exam Location:  [facility] Procedure: 2D Echo, Limited Color Doppler, Cardiac Doppler and Intracardiac            Opacification Agent Indications:     I50.9 Congestive Heart Failure  History:         Patient has prior history of Echocardiogram examinations. Risk                  Factors:Hypertension and Dyslipidemia. Schizophrenia.  Sonographer:     Humphrey Rolls RDCS (AE) Referring Phys:  [id number] [facility] Diagnosing Phys: Lorine Bears MD  Sonographer Comments: Technically challenging study due to limited acoustic windows and Technically difficult study due to poor echo windows. Image acquisition challenging due to patient body habitus. IMPRESSIONS  1. Left ventricular ejection fraction, by estimation, is 55 to 60%. The left ventricle has normal function. Left ventricular endocardial border not optimally defined to evaluate regional  wall motion. Left ventricular diastolic parameters are consistent with Grade I diastolic dysfunction (impaired relaxation).  2. Right ventricular systolic function is normal. The right ventricular size is normal. Tricuspid regurgitation signal is inadequate for assessing PA pressure.  3. The mitral valve was not well visualized. No evidence of mitral valve regurgitation. No evidence of mitral stenosis.  4. The aortic valve was not well visualized. Aortic valve regurgitation is not visualized. No aortic stenosis is present.  5. Challenging image quality. FINDINGS  Left Ventricle: Left ventricular ejection fraction, by estimation, is 55 to 60%. The left ventricle has normal function. Left ventricular endocardial border not optimally defined to evaluate regional wall motion. Definity contrast agent was given IV to delineate the left ventricular endocardial borders. The left ventricular internal cavity size was normal in size. There is no left ventricular hypertrophy. Left ventricular diastolic parameters are consistent with Grade I diastolic dysfunction (impaired relaxation). Right Ventricle: The right ventricular size is normal. No increase in right ventricular wall thickness. Right ventricular systolic function is normal. Tricuspid regurgitation signal is inadequate for assessing PA pressure. Left Atrium: Left atrial size was normal in size. Right Atrium: Right atrial size was normal in size. Pericardium: There is no evidence of pericardial effusion. Mitral Valve: The mitral  valve was not well visualized. No evidence of mitral valve regurgitation. No evidence of mitral valve stenosis. MV peak gradient, 6.0 mmHg. The mean mitral valve gradient is 3.0 mmHg. Tricuspid Valve: The tricuspid valve is not well visualized. Tricuspid valve regurgitation is not demonstrated. No evidence of tricuspid stenosis. Aortic Valve: The aortic valve was not well visualized. Aortic valve regurgitation is not visualized. No aortic stenosis is present. Aortic valve mean gradient measures 4.0 mmHg. Aortic valve peak gradient measures 10.1 mmHg. Pulmonic Valve: The pulmonic valve was normal in structure. Pulmonic valve regurgitation is not visualized. No evidence of pulmonic stenosis. Aorta: The aortic root is normal in size and structure. Venous: The inferior vena cava was not well visualized. IAS/Shunts: No atrial level shunt detected by color flow Doppler.   Diastology LV e' medial:    9.03 cm/s LV E/e' medial:  7.7 LV e' lateral:   7.72 cm/s LV E/e' lateral: 9.0  AORTIC VALVE AV Vmax:           159.00 cm/s AV Vmean:          94.500 cm/s AV VTI:            0.331 m AV Peak Grad:      10.1 mmHg AV Mean Grad:      4.0 mmHg LVOT Vmax:         107.00 cm/s LVOT Vmean:        73.000 cm/s LVOT VTI:          0.231 m LVOT/AV VTI ratio: 0.70 MITRAL VALVE MV Area (PHT): 4.39 cm    SHUNTS MV Peak grad:  6.0 mmHg    Systemic VTI: 0.23 m MV Mean grad:  3.0 mmHg MV Vmax:       1.22 m/s MV Vmean:      78.7 cm/s MV Decel Time: 173 msec MV E velocity: 69.40 cm/s MV A velocity: 93.40 cm/s MV E/A ratio:  0.74 Lorine Bears MD Electronically signed by Lorine Bears MD Signature Date/Time: 08/08/2020/10:16:10 AM    Final         Scheduled Meds:  budesonide (PULMICORT) nebulizer solution  0.25 mg Nebulization BID   ipratropium-albuterol  3 mL Nebulization BID   methylPREDNISolone (  SOLU-MEDROL) injection  40 mg Intravenous Q12H   metoprolol succinate  25 mg Oral Daily   OLANZapine  30 mg Oral Daily   pantoprazole  40 mg  Oral Daily   risperiDONE  1 mg Oral BID   rivaroxaban  20 mg Oral Daily   simvastatin  20 mg Oral QHS   tamsulosin  0.4 mg Oral QPC supper   torsemide  40 mg Oral BID   Continuous Infusions:   LOS: 1 day    Time spent: 27 minutes    Marrion Coy, MD [facility]   To contact the attending provider between 7A-7P or the covering provider during after hours 7P-7A, please log into the web site [email or web address] and access using universal [place] password for that web site. If you do not have the password, please call the hospital operator.  08/09/2020, 1:11 PM

## 2020-08-09 NOTE — NC FL2 (Signed)
[place] MEDICAID FL2 LEVEL OF CARE SCREENING TOOL     IDENTIFICATION  Patient Name: Anthony Chambers Birthdate: 1945-03-01 Sex: male Admission Date (Current Location): 08/07/2020  [facility] and [place] Number:  Chiropodist and Address:  [facility], [address], [place], [place] [address]      Provider Number: [id number]  Attending Physician Name and Address:  Marrion Coy, MD  Relative Name and Phone Number:       Current Level of Care: Hospital Recommended Level of Care: Skilled Nursing Facility Prior Approval Number:    Date Approved/Denied:   PASRR Number: [id number]  Discharge Plan: SNF    Current Diagnoses: Patient Active Problem List   Diagnosis Date Noted   Acute on chronic respiratory failure with hypoxia and hypercapnia (HCC) 08/08/2020   Acute on chronic diastolic CHF (congestive heart failure) (HCC) 08/08/2020   COPD exacerbation (HCC) 08/07/2020   DVT (deep venous thrombosis) (HCC) 10/20/2019   GERD (gastroesophageal reflux disease) 10/20/2019   Hyperlipidemia 10/20/2019   Acute respiratory failure with hypoxia and hypercapnia (HCC) 06/01/2018   PVD (peripheral vascular disease) (HCC) 08/15/2012    Orientation RESPIRATION BLADDER Height & Weight     Self  O2 (Nasal Canula 4 L) Continent Weight:   Height:     BEHAVIORAL SYMPTOMS/MOOD NEUROLOGICAL BOWEL NUTRITION STATUS   (None)  (None) Continent Diet (Heart healthy with fluid restrictions: 1200 mL. Can you please make this a [facility] SOFT DIET CONSISTENCY also?   Definitely no Salads, cut meats.)  AMBULATORY STATUS COMMUNICATION OF NEEDS Skin   Total Care Verbally Skin abrasions                       Personal Care Assistance Level of Assistance              Functional Limitations Info  Sight, Hearing, Speech Sight Info: Adequate Hearing Info: Adequate Speech Info: Adequate    SPECIAL CARE FACTORS FREQUENCY                       Contractures  Contractures Info: Not present    Additional Factors Info  Code Status, Allergies Code Status Info: Full code Allergies Info: Heparin           Current Medications (08/09/2020):  This is the current hospital active medication list Current Facility-Administered Medications  Medication Dose Route Frequency Provider Last Rate Last Admin   acetaminophen (TYLENOL) tablet 650 mg  650 mg Oral Q6H PRN Eduard Clos, MD       Or   acetaminophen (TYLENOL) suppository 650 mg  650 mg Rectal Q6H PRN Eduard Clos, MD       albuterol (PROVENTIL) (2.5 MG/3ML) 0.083% nebulizer solution 2.5 mg  2.5 mg Nebulization Q2H PRN Eduard Clos, MD       budesonide (PULMICORT) nebulizer solution 0.25 mg  0.25 mg Nebulization BID Eduard Clos, MD   0.25 mg at 08/09/20 0743   furosemide (LASIX) injection 40 mg  40 mg Intravenous Q12H Eduard Clos, MD   40 mg at 08/09/20 0347   ipratropium-albuterol (DUONEB) 0.5-2.5 (3) MG/3ML nebulizer solution 3 mL  3 mL Nebulization BID Eduard Clos, MD   3 mL at 08/09/20 0743   methylPREDNISolone sodium succinate (SOLU-MEDROL) 40 mg/mL injection 40 mg  40 mg Intravenous Q12H Marrion Coy, MD   40 mg at 08/09/20 0221   metoprolol succinate (TOPROL-XL) 24 hr tablet 25  mg  25 mg Oral Daily Eduard Clos, MD   25 mg at 08/09/20 0944   OLANZapine (ZYPREXA) tablet 30 mg  30 mg Oral Daily Eduard Clos, MD   30 mg at 08/09/20 0948   pantoprazole (PROTONIX) EC tablet 40 mg  40 mg Oral Daily Eduard Clos, MD   40 mg at 08/09/20 0945   risperiDONE (RISPERDAL) tablet 1 mg  1 mg Oral BID Eduard Clos, MD   1 mg at 08/09/20 3329   rivaroxaban (XARELTO) tablet 20 mg  20 mg Oral Daily Eduard Clos, MD   20 mg at 08/09/20 5188   senna-docusate (Senokot-S) tablet 1 tablet  1 tablet Oral BID PRN Eduard Clos, MD       simvastatin (ZOCOR) tablet 20 mg  20 mg Oral QHS Eduard Clos, MD   20 mg at 08/08/20  2133   tamsulosin (FLOMAX) capsule 0.4 mg  0.4 mg Oral QPC supper Eduard Clos, MD   0.4 mg at 08/08/20 1728     Discharge Medications: Please see discharge summary for a list of discharge medications.  Relevant Imaging Results:  Relevant Lab Results:   Additional Information SS#: [id number]  Margarito Liner, LCSW

## 2020-08-09 NOTE — Progress Notes (Signed)
Mobility Specialist - Progress Note   08/09/20 1200  Mobility  Activity Stood at bedside  Level of Assistance Minimal assist, patient does 75% or more  Assistive Device Front wheel walker  Distance Ambulated (ft) 0 ft  Mobility Response Tolerated well  Mobility performed by Mobility specialist  $Mobility charge 1 Mobility    Pre-mobility: 77 HR, 91% SpO2 During mobility: 82 HR, 93% SpO2 Post-mobility: 77 HR, 91% SpO2   Pt lying in bed upon arrival utilizing 4L. Pt Aox2 to self and location. Pt sits EOB with supervision, no dizziness. At one point pt states he can only walk to the door and back, then later says he cannot stand and has been w/c bound PTA. Pt able to coming into standing with minA from elevated bed height. Pt very shaky and unsteady once upright, appears mildly anxious, requesting to sit back down deferring further OOB activity. Heavy breathing noted, PLB engaged. O2 maintained > 90% throughout session. Pt returned to bed with supervision. Alarm set and needs in reach.    Filiberto Pinks Mobility Specialist 08/09/20, 12:24 PM

## 2020-08-10 DIAGNOSIS — J9601 Acute respiratory failure with hypoxia: Secondary | ICD-10-CM | POA: Diagnosis not present

## 2020-08-10 DIAGNOSIS — J9602 Acute respiratory failure with hypercapnia: Secondary | ICD-10-CM | POA: Diagnosis not present

## 2020-08-10 LAB — BASIC METABOLIC PANEL
Anion gap: 8 (ref 5–15)
BUN: 43 mg/dL — ABNORMAL HIGH (ref 8–23)
CO2: 37 mmol/L — ABNORMAL HIGH (ref 22–32)
Calcium: 8.6 mg/dL — ABNORMAL LOW (ref 8.9–10.3)
Chloride: 92 mmol/L — ABNORMAL LOW (ref 98–111)
Creatinine, Ser: 1.34 mg/dL — ABNORMAL HIGH (ref 0.61–1.24)
GFR, Estimated: 56 mL/min — ABNORMAL LOW (ref >60)
Glucose, Bld: 133 mg/dL — ABNORMAL HIGH (ref 70–99)
Potassium: 4.6 mmol/L (ref 3.5–5.1)
Sodium: 137 mmol/L (ref 135–145)

## 2020-08-10 LAB — MAGNESIUM: Magnesium: 2.3 mg/dL (ref 1.7–2.4)

## 2020-08-10 LAB — SARS CORONAVIRUS 2 (TAT 6-24 HRS): SARS Coronavirus 2: NEGATIVE

## 2020-08-10 NOTE — Progress Notes (Signed)
Mobility Specialist - Progress Note   08/10/20 1200  Mobility  Activity Stood at bedside  Range of Motion/Exercises Right leg;Left leg (TR, HR, SLR, ISO, ABD, SM)  Level of Assistance Minimal assist, patient does 75% or more  Assistive Device Front wheel walker  Distance Ambulated (ft) 0 ft  Mobility Sit up in bed/chair position for meals  Mobility Response Tolerated well  Mobility performed by Mobility specialist  $Mobility charge 1 Mobility    During mobility: 108 HR, 93% SpO2 Post-mobility: 94 HR, 92% SpO2   Pt lying in bed upon arrival, utilizing 4L. Sister and brother-in-law at bedside. Pt voiced headache pain. Able to come into sitting position with supervision, no dizziness. Pt stood at bedside with minA. VC for sequencing. Extra time needed for steadying. Pt sat back EOB and participated in seated therex: toe raises, heel raises, straight leg raises, isometrics, abduction, and seated march. VC for technique. Limited plantarflexion of the foot. Pt stood to RW again, more unsteady this attempt than 1st. Denied SOB and pain. Pt returned to bed with needs in reach. Repositioned to [facility] +2 for lunch tray setup. Alarm set.    Filiberto Pinks Mobility Specialist 08/10/20, 12:49 PM

## 2020-08-10 NOTE — Progress Notes (Signed)
[facility] PROGRESS [facility]    Haynes BastDanny C Richeson  JYN:[id number] DOB: 08-Apr-1945 DOA: 08/07/2020 PCP: Collier BullockHester, Willie E Jr., MD      Brief Narrative:  Mr. Anthony FishmanSharpe is a 75 y.o. M with COPD on 4L oxygen, chronic CHF, sleep apnea noncompliant with BiPAP, and schizophrenia who was brought to the ER twice in 3 days because of worsening shortness of breath over several days.  In the ER, found to have significant CO2 retention, placed on supplemental oxygen at a higher rate than baseline, and started on treatment for COPD and CHF exacerbation with IV Lasix and IV steroids.        Assessment & Plan:  Acute respiratory failure with hypoxia hypercapnia Acute on chronic diastolic CHF COPD exacerbation -Continue Pulmicort, scheduled Saba/Sama - Continue Solu-Medrol -Hold diuretics today Continue simvastatin, metoprolol, pantoprazole   AKI Creatinine up to 1.3 today from baseline 0.8 - Hold diuretics  Acute metabolic encephalopathy secondary to CO2 retention Schizophrenia Baseline dementia -Continue olanzapine  Obstructive sleep apnea Noncompliant with BiPAP  Anemia and thrombocytopenia Stable  History of DVT -Continue Xarelto  Hypokalemia Repleted and resolved  BPH - Continue Flomax       Disposition: Status is: Inpatient  Remains inpatient appropriate because:IV treatments appropriate due to intensity of illness or inability to take PO  Dispo: The patient is from: SNF              Anticipated d/c is to: SNF              Patient currently is not medically stable to d/c.   Difficult to place patient No       Level of care: Med-Surg       MDM: The below labs and imaging reports were reviewed and summarized above.  Medication management as above.    DVT prophylaxis:  rivaroxaban (XARELTO) tablet 20 mg  Code Status: FULL Family Communication:          Subjective: Headache, confusion, vomiting.  No dyspnea, chest pain no  swelling.  Objective: Vitals:   08/10/20 0723 08/10/20 0741 08/10/20 1504 08/10/20 1533  BP: 130/66  108/76 114/66  Pulse: (!) 57  77 75  Resp: 20  20 20   Temp: 97.7 F (36.5 C)  98.6 F (37 C) 98 F (36.7 C)  TempSrc: Oral  Oral   SpO2: 93% 97% 94% 98%    Intake/Output Summary (Last 24 hours) at 08/10/2020 1648 Last data filed at 08/10/2020 1406 Gross per 24 hour  Intake 960 ml  Output 3150 ml  Net -2190 ml   There were no vitals filed for this visit.  Examination: General appearance: Elderly adult male, alert and in no acute distress.  Watching television HEENT: Anicteric, conjunctiva pink, lids and lashes normal. No nasal deformity, discharge, epistaxis.  Lips moist edentulous, oropharynx moist, no oral lesions.  Normal.   Skin: Warm and dry.  no jaundice.  No suspicious rashes or lesions. Cardiac: RRR, nl S1-S2, no murmurs appreciated.  Capillary refill is brisk.  JVP normal.  No LE edema.  Radial pulses 2+ and symmetric. Respiratory: Normal respiratory rate and rhythm.  CTAB without rales or wheezes. Abdomen: Abdomen soft.  no TTP. No ascites, distension, hepatosplenomegaly.   MSK: No deformities or effusions. Neuro: Awake and alert.  EOMI, moves all extremities. Speech fluent.    Psych: Sensorium intact and responding to questions, attention normal. Affect pleasant.  Judgment and insight appear moderately impaired.    Data Reviewed: I  have personally reviewed following labs and imaging studies:  CBC: Recent Labs  Lab 08/04/20 1927 08/07/20 1727 08/08/20 0403 08/09/20 0602  WBC 5.4 6.1 7.9 9.0  NEUTROABS 4.5 3.9  --  7.9*  HGB 12.6* 12.0* 12.9* 12.9*  HCT 40.1 38.6* 41.4 39.8  MCV 93.9 97.0 96.3 91.3  PLT 135* 128* 139* 177   Basic Metabolic Panel: Recent Labs  Lab 08/04/20 1927 08/07/20 1727 08/08/20 0403 08/09/20 0602 08/10/20 0503  NA 141 141 141 139 137  K 3.8 4.1 4.1 3.4* 4.6  CL 96* 99 95* 93* 92*  CO2 39* 41* 36* 37* 37*  GLUCOSE 111* 127*  149* 126* 133*  BUN 21 20 22  30* 43*  CREATININE 0.95 0.98 0.93 1.00 1.34*  CALCIUM 8.1* 8.3* 8.0* 8.2* 8.6*  MG  --  2.4 2.4  --  2.3   GFR: Estimated Creatinine Clearance: 65.8 mL/min (A) (by C-G formula based on SCr of 1.34 mg/dL (H)). Liver Function Tests: Recent Labs  Lab 08/07/20 1727  AST 16  ALT 12  ALKPHOS 66  BILITOT 0.4  PROT 6.5  ALBUMIN 3.0*   No results for input(s): LIPASE, AMYLASE in the last 168 hours. No results for input(s): AMMONIA in the last 168 hours. Coagulation Profile: No results for input(s): INR, PROTIME in the last 168 hours. Cardiac Enzymes: No results for input(s): CKTOTAL, CKMB, CKMBINDEX, TROPONINI in the last 168 hours. BNP (last 3 results) No results for input(s): PROBNP in the last 8760 hours. HbA1C: No results for input(s): HGBA1C in the last 72 hours. CBG: No results for input(s): GLUCAP in the last 168 hours. Lipid Profile: No results for input(s): CHOL, HDL, LDLCALC, TRIG, CHOLHDL, LDLDIRECT in the last 72 hours. Thyroid Function Tests: Recent Labs    08/08/20 0403  TSH 0.628   Anemia Panel: No results for input(s): VITAMINB12, FOLATE, FERRITIN, TIBC, IRON, RETICCTPCT in the last 72 hours. Urine analysis: No results found for: COLORURINE, APPEARANCEUR, LABSPEC, PHURINE, GLUCOSEU, HGBUR, BILIRUBINUR, KETONESUR, PROTEINUR, UROBILINOGEN, NITRITE, LEUKOCYTESUR Sepsis Labs: @LABRCNTIP (procalcitonin:4,lacticacidven:4)  ) Recent Results (from the past 240 hour(s))  Resp Panel by RT-PCR (Flu A&B, Covid) Nasopharyngeal Swab     Status: None   Collection Time: 08/04/20  7:37 PM   Specimen: Nasopharyngeal Swab; Nasopharyngeal(NP) swabs in vial transport medium  Result Value Ref Range Status   SARS Coronavirus 2 by RT PCR NEGATIVE NEGATIVE Final    Comment: (NOTE) SARS-CoV-2 target nucleic acids are NOT DETECTED.  The SARS-CoV-2 RNA is generally detectable in upper respiratory specimens during the acute phase of infection. The  lowest concentration of SARS-CoV-2 viral copies this assay can detect is 138 copies/mL. A negative result does not preclude SARS-Cov-2 infection and should not be used as the sole basis for treatment or other patient management decisions. A negative result may occur with  improper specimen collection/handling, submission of specimen other than nasopharyngeal swab, presence of viral mutation(s) within the areas targeted by this assay, and inadequate number of viral copies(<138 copies/mL). A negative result must be combined with clinical observations, patient history, and epidemiological information. The expected result is Negative.  Fact Sheet for Patients:   Fact Sheet for Healthcare Providers:  08/06/20  This test is no t yet approved or cleared by the [employer] FDA and  has been authorized for detection and/or diagnosis of SARS-CoV-2 by FDA under an Emergency Use Authorization (EUA). This EUA will remain  in effect (meaning this test can be used) for the duration of the  COVID-19 declaration under Section 564(b)(1) of the Act, 21 U.S.C.section 360bbb-3(b)(1), unless the authorization is terminated  or revoked sooner.       Influenza A by PCR NEGATIVE NEGATIVE Final   Influenza B by PCR NEGATIVE NEGATIVE Final    Comment: (NOTE) The Xpert Xpress SARS-CoV-2/FLU/RSV plus assay is intended as an aid in the diagnosis of influenza from Nasopharyngeal swab specimens and should not be used as a sole basis for treatment. Nasal washings and aspirates are unacceptable for Xpert Xpress SARS-CoV-2/FLU/RSV testing.  Fact Sheet for Patients: BloggerCourse.com  Fact Sheet for Healthcare Providers: SeriousBroker.it  This test is not yet approved or cleared by the [place] FDA and has been authorized for detection and/or diagnosis of SARS-CoV-2 by FDA under  an Emergency Use Authorization (EUA). This EUA will remain in effect (meaning this test can be used) for the duration of the COVID-19 declaration under Section 564(b)(1) of the Act, 21 U.S.C. section 360bbb-3(b)(1), unless the authorization is terminated or revoked.  Performed at [facility], [address] [place], [place] [address]   Resp Panel by RT-PCR (Flu A&B, Covid) Nasopharyngeal Swab     Status: None   Collection Time: 08/07/20  5:27 PM   Specimen: Nasopharyngeal Swab; Nasopharyngeal(NP) swabs in vial transport medium  Result Value Ref Range Status   SARS Coronavirus 2 by RT PCR NEGATIVE NEGATIVE Final    Comment: (NOTE) SARS-CoV-2 target nucleic acids are NOT DETECTED.  The SARS-CoV-2 RNA is generally detectable in upper respiratory specimens during the acute phase of infection. The lowest concentration of SARS-CoV-2 viral copies this assay can detect is 138 copies/mL. A negative result does not preclude SARS-Cov-2 infection and should not be used as the sole basis for treatment or other patient management decisions. A negative result may occur with  improper specimen collection/handling, submission of specimen other than nasopharyngeal swab, presence of viral mutation(s) within the areas targeted by this assay, and inadequate number of viral copies(<138 copies/mL). A negative result must be combined with clinical observations, patient history, and epidemiological information. The expected result is Negative.  Fact Sheet for Patients:  BloggerCourse.com  Fact Sheet for Healthcare Providers:  SeriousBroker.it  This test is no t yet approved or cleared by the [place] FDA and  has been authorized for detection and/or diagnosis of SARS-CoV-2 by FDA under an Emergency Use Authorization (EUA). This EUA will remain  in effect (meaning this test can be used) for the duration of the COVID-19 declaration under  Section 564(b)(1) of the Act, 21 U.S.C.section 360bbb-3(b)(1), unless the authorization is terminated  or revoked sooner.       Influenza A by PCR NEGATIVE NEGATIVE Final   Influenza B by PCR NEGATIVE NEGATIVE Final    Comment: (NOTE) The Xpert Xpress SARS-CoV-2/FLU/RSV plus assay is intended as an aid in the diagnosis of influenza from Nasopharyngeal swab specimens and should not be used as a sole basis for treatment. Nasal washings and aspirates are unacceptable for Xpert Xpress SARS-CoV-2/FLU/RSV testing.  Fact Sheet for Patients: BloggerCourse.com  Fact Sheet for Healthcare Providers: SeriousBroker.it  This test is not yet approved or cleared by the [place] FDA and has been authorized for detection and/or diagnosis of SARS-CoV-2 by FDA under an Emergency Use Authorization (EUA). This EUA will remain in effect (meaning this test can be used) for the duration of the COVID-19 declaration under Section 564(b)(1) of the Act, 21 U.S.C. section 360bbb-3(b)(1), unless the authorization is terminated or revoked.  Performed at [facility], [address] [place], [place] [address]          Radiology Studies: No results found.      Scheduled Meds:  budesonide (PULMICORT) nebulizer solution  0.25 mg Nebulization BID   ipratropium-albuterol  3 mL Nebulization BID   methylPREDNISolone (SOLU-MEDROL) injection  40 mg Intravenous Q12H   metoprolol succinate  25 mg Oral Daily   OLANZapine  30 mg Oral Daily   pantoprazole  40 mg Oral Daily   risperiDONE  1 mg Oral BID   rivaroxaban  20 mg Oral Daily   simvastatin  20 mg Oral QHS   tamsulosin  0.4 mg Oral QPC supper   Continuous Infusions:   LOS: 2 days    Time spent: 25 minutes    Alberteen Sam, MD [facility] 08/10/2020, 4:48 PM     Please page though AMION or Epic secure chat:  For [employer], Higher education careers adviser

## 2020-08-11 DIAGNOSIS — J9602 Acute respiratory failure with hypercapnia: Secondary | ICD-10-CM | POA: Diagnosis not present

## 2020-08-11 DIAGNOSIS — J9601 Acute respiratory failure with hypoxia: Secondary | ICD-10-CM | POA: Diagnosis not present

## 2020-08-11 LAB — BLOOD GAS, ARTERIAL
Acid-Base Excess: 14.7 mmol/L — ABNORMAL HIGH (ref 0.0–2.0)
Bicarbonate: 41 mmol/L — ABNORMAL HIGH (ref 20.0–28.0)
FIO2: 0.36
O2 Saturation: 94.6 %
Patient temperature: 37
pCO2 arterial: 55 mmHg — ABNORMAL HIGH (ref 32.0–48.0)
pH, Arterial: 7.48 — ABNORMAL HIGH (ref 7.350–7.450)
pO2, Arterial: 68 mmHg — ABNORMAL LOW (ref 83.0–108.0)

## 2020-08-11 LAB — BASIC METABOLIC PANEL
Anion gap: 8 (ref 5–15)
BUN: 47 mg/dL — ABNORMAL HIGH (ref 8–23)
CO2: 36 mmol/L — ABNORMAL HIGH (ref 22–32)
Calcium: 8.4 mg/dL — ABNORMAL LOW (ref 8.9–10.3)
Chloride: 95 mmol/L — ABNORMAL LOW (ref 98–111)
Creatinine, Ser: 1.15 mg/dL (ref 0.61–1.24)
GFR, Estimated: >60 mL/min (ref >60)
Glucose, Bld: 135 mg/dL — ABNORMAL HIGH (ref 70–99)
Potassium: 4.1 mmol/L (ref 3.5–5.1)
Sodium: 139 mmol/L (ref 135–145)

## 2020-08-11 MED ORDER — METOPROLOL SUCCINATE ER 25 MG PO TB24: 25.0000 mg | ORAL_TABLET | Freq: Every day | ORAL | Status: DC

## 2020-08-11 MED ORDER — PREDNISONE 10 MG PO TABS: ORAL_TABLET | ORAL | 0 refills | Status: AC

## 2020-08-11 NOTE — TOC Transition Note (Signed)
Transition of Care [facility]) - CM/SW Discharge Note   Patient Details  Name: Anthony Chambers MRN: [id number] Date of Birth: 1945-04-20  Transition of [facility]) CM/SW Contact:  Margarito Liner, LCSW Phone Number: 08/11/2020, 10:09 AM   Clinical Narrative:  Patient has orders to discharge to [facility] today. RN has already called report. EMS transport set up for 12:00. No further concerns. CSW signing off.   Final next level of care: Skilled Nursing Facility Barriers to Discharge: Barriers Resolved   Patient Goals and CMS Choice     Choice offered to / list presented to : NA  Discharge Placement   Existing PASRR number confirmed : 08/09/20          Patient chooses bed at: [facility] [place] Patient to be transferred to facility by: EMS Name of family member notified: [name] Patient and family notified of of transfer: 08/11/20  Discharge Plan and Services     Post Acute Care Choice: Resumption of Svcs/PTA Provider                               Social Determinants of Health (SDOH) Interventions     Readmission Risk Interventions Readmission Risk Prevention Plan 06/03/2018  Post Dischage Appt Complete  Medication Screening Complete  Transportation Screening Complete  Some recent data might be hidden

## 2020-08-11 NOTE — Progress Notes (Addendum)
Speech Language Pathology Treatment: Dysphagia  Patient Details Name: Anthony Chambers MRN: [id number] DOB: 1945-07-18 Today's Date: 08/11/2020 Time: 1620-1700 SLP Time Calculation (min) (ACUTE ONLY): 40 min  Assessment / Plan / Recommendation Clinical Impression  Pt appears to present w/ adequate oropharyngeal phase swallow w/ No oropharyngeal phase dysphagia noted, No neuromuscular deficits noted. Pt consumed po trials w/ No overt, clinical s/s of aspiration during po trials. Pt appears at reduced risk for aspiration following general aspiration precautions. However, pt is Edentulous at Baseline and has Baseline Cognitive decline, Dementia. He is Impulsive during self-feeding. These factors can increase risk for choking, aspiration.  During po trials, pt consumed all consistencies w/ no overt coughing, decline in vocal quality, or change in respiratory presentation during/post trials. Oral phase appeared [facility] w/ timely bolus management, mashing/gumming, and control of bolus propulsion for A-P transfer for swallowing. Oral clearing achieved w/ all trial consistencies. Pt fed self w/ setup support. Pt was instructed and cued on slowing down and using SMALLER bites of food to lessen risk for choking d/t being min impulsive w/ oral intake.  Recommend a more mech soft consistency diet w/ well-Cut meats, moistened foods; Thin liquids. Recommend general aspiration precautions, monitoring during meals, Pills WHOLE in Puree for safer, easier swallowing IF any difficulty noted w/ liquids -- d/t Baseline Cognitive decline issue. Education given on Pills in Puree; food consistencies and easy to eat options; general aspiration precautions to NSG/pt/posted in chart/room. Also GERD precautions baseline. NSG to reconsult if any new needs arise. NSG agreed.     HPI HPI: Pt is a 75 y.o. male with history of COPD on 4 L oxygen, GERD, chronic CHF, sleep apnea noncompliant with BiPAP, schizophrenia was brought to the ER  for the second time in the last 72 hours because of worsening shortness of breath.  As per the report patient has been increasingly short of breath last 4 days.  Did not complain of any chest pain.  Also was increasingly confused.  ED Course: In the ER patient was initially hypoxic requiring 8 L oxygen.  Chest x-ray shows congestion and possible infiltrates.  Labs show high sensitive troponin of 14 BNP of 377 EKG shows normal sinus rhythm.  CBC shows hemoglobin of 12 and platelets of 128 stable when compared to 3 days ago.  COVID test was negative.  Patient was given Lasix 60 mg IV placed on nebulizer and steroids admitted for further management of acute respiratory failure with hypoxia and hypercarbia likely a combination of COPD and CHF.   CXR at admit: Coarsened interstitium bilaterally with more focal opacity in the  left base. An infectious process/pneumonia in the left base is considered most likely.      SLP Plan  All goals met       Recommendations  Diet recommendations: Dysphagia 3 (mechanical soft);Thin liquid (meats minced d/t Edentulous status) Liquids provided via: Cup;Straw (monitor) Medication Administration: Whole meds with puree (as needed for safer swallowing) Supervision: Patient able to self feed (tray setup) Compensations: Minimize environmental distractions;Slow rate;Small sips/bites;Lingual sweep for clearance of pocketing;Follow solids with liquid Postural Changes and/or Swallow Maneuvers: Out of bed for meals;Seated upright 90 degrees;Upright 30-60 min after meal                General recommendations:  (Dietician f/u as needeed) Oral Care Recommendations: Oral care BID;Oral care before and after PO;Staff/trained caregiver to provide oral care Follow up Recommendations: None SLP Visit Diagnosis: Dysphagia, oral phase (R13.11) (edentulous; impulsive at  times; decreased awareness) Plan: All goals met       [place], MS,  CCC-SLP Speech Language Pathologist Rehab Services [phone] [facility] 08/11/2020, 1:44 PM

## 2020-08-11 NOTE — Discharge Summary (Signed)
Physician Discharge Summary  Anthony Chambers UJW:[id number] DOB: 1945/06/11 DOA: 08/07/2020  PCP: Collier Bullock., MD  Admit date: 08/07/2020 Discharge date: 08/11/2020  Admitted From: SNF  Disposition:  SNF   Recommendations for Outpatient Follow-up:  Please obtain BMP in 1 week to recheck Cr, K Please review Olanzapine and risperidone dual therapy with original prescriber and eliminate one if able       Home Health: N/A  Equipment/Devices: TBD at SNF  Discharge Condition: Poor  CODE STATUS: FULL Diet recommendation: Cardiac  Brief/Interim Summary: Anthony Chambers is a 75 y.o. M with COPD on 4L oxygen, chronic CHF, sleep apnea noncompliant with BiPAP, and schizophrenia who was brought to the ER twice in 3 days because of worsening shortness of breath over several days.  In the ER, found to have significant CO2 retention, placed on supplemental oxygen at a higher rate than baseline, and started on treatment for COPD and CHF exacerbation with IV Lasix and IV steroids.     PRINCIPAL HOSPITAL DIAGNOSIS: Acute respiratory failure with hypoxia hypercapnia    Discharge Diagnoses:   Acute respiratory failure with hypercapnia Acute on chronic diastolic CHF COPD exacerbation Hypoxic respiratory failure ruled out, however, patient presented with dyspnea, reduced SpO2, somnolence and PCO2 80.  On admission, CXR showed opacities, BNP was up and patient had dyspnea.  Treated with IV Lasix and diuresed 7L.  Also treated with steoroids, bronchodilators and Breahting improved to baseline.    Discharged on 8 days steroids taper.      AKI Creatinine up to 1.3 from baseline 0.8 due to diuresis.  Diuretics held and Cr improved.   Acute metabolic encephalopathy secondary to CO2 retention Schizophrenia Baseline dementia At baseline, the patient has dementia, but is alert and interactive.  On admission he was somnolent and confused more than basleine due to CO2  retention.  Obstructive sleep apnea Noncompliant with BiPAP  Anemia and thrombocytopenia Stable  History of DVT Continue Xarelto  Hypokalemia Repleted and resolved   BPH              Discharge Instructions  Discharge Instructions     Diet - low sodium heart healthy   Complete by: As directed    Increase activity slowly   Complete by: As directed       Allergies as of 08/11/2020       Reactions   Heparin         Medication List     STOP taking these medications    guaifenesin 100 MG/5ML syrup Commonly known as: ROBITUSSIN   melatonin 5 MG Tabs   multivitamin with minerals Tabs tablet   PARoxetine 25 MG 24 hr tablet Commonly known as: PAXIL-CR       TAKE these medications    acetaminophen 500 MG tablet Commonly known as: TYLENOL Take 1,000 mg by mouth at bedtime.   acetaminophen 325 MG tablet Commonly known as: TYLENOL Take 650 mg by mouth every 4 (four) hours as needed for mild pain or moderate pain.   albuterol 108 (90 Base) MCG/ACT inhaler Commonly known as: VENTOLIN HFA Inhale 2 puffs into the lungs every 4 (four) hours as needed for wheezing or shortness of breath.   Combivent Respimat 20-100 MCG/ACT Aers respimat Generic drug: Ipratropium-Albuterol Inhale 1 puff into the lungs in the morning, at noon, in the evening, and at bedtime.   ergocalciferol 1.25 MG (50000 UT) capsule Commonly known as: VITAMIN D2 Take 50,000 Units by mouth once a week. Every  wednesday   furosemide 40 MG tablet Commonly known as: LASIX Take 40 mg by mouth daily.   metoprolol succinate 25 MG 24 hr tablet Commonly known as: TOPROL-XL Take 1 tablet (25 mg total) by mouth daily. Take with or immediately following a meal. What changed:  additional instructions Another medication with the same name was removed. Continue taking this medication, and follow the directions you see here.   OLANZapine 15 MG tablet Commonly known as: ZYPREXA Take 30 mg by  mouth daily.   omeprazole 20 MG capsule Commonly known as: PRILOSEC Take 20 mg by mouth daily.   OXYGEN Inhale 4 L/min into the lungs continuous.   predniSONE 10 MG tablet Commonly known as: DELTASONE Take 4 tablets (40 mg total) by mouth daily for 2 days, THEN 3 tablets (30 mg total) daily for 2 days, THEN 2 tablets (20 mg total) daily for 2 days, THEN 1 tablet (10 mg total) daily for 2 days. Start taking on: August 11, 2020   risperiDONE 1 MG tablet Commonly known as: RISPERDAL Take 1 mg by mouth 2 (two) times daily.   rivaroxaban 20 MG Tabs tablet Commonly known as: XARELTO Take 20 mg by mouth daily.   saccharomyces boulardii 250 MG capsule Commonly known as: FLORASTOR Take 250 mg by mouth 2 (two) times daily.   sennosides-docusate sodium 8.6-50 MG tablet Commonly known as: SENOKOT-S Take 1 tablet by mouth in the morning and at bedtime.   simvastatin 20 MG tablet Commonly known as: ZOCOR Take 20 mg by mouth at bedtime.   tamsulosin 0.4 MG Caps capsule Commonly known as: FLOMAX Take 1 capsule (0.4 mg total) by mouth daily after supper.   triamcinolone cream 0.5 % Commonly known as: KENALOG Apply 1 application topically 2 (two) times daily.        Follow-up Information     Hester, Sheryle Hail., MD. Schedule an appointment as soon as possible for a visit in 1 week(s).   Specialty: Nephrology Contact information: [address] [place] [address] [phone]                Allergies  Allergen Reactions   Heparin        Procedures/Studies: CT HEAD WO CONTRAST  Result Date: 08/07/2020 CLINICAL DATA:  Altered mental status. EXAM: CT HEAD WITHOUT CONTRAST TECHNIQUE: Contiguous axial images were obtained from the base of the skull through the vertex without intravenous contrast. COMPARISON:  None. FINDINGS: Brain: No evidence of acute infarction, hemorrhage, hydrocephalus, extra-axial collection, or mass lesion/mass effect. Severe chronic small vessel  disease is noted. Vascular:  No hyperdense vessel or other acute findings. Skull: No evidence of fracture or other significant bone abnormality. Sinuses/Orbits:  No acute findings. Other: None. IMPRESSION: No acute intracranial abnormality. Severe chronic small vessel disease. Electronically Signed   By: Danae Orleans M.D.   On: 08/07/2020 20:38   DG Chest Portable 1 View  Result Date: 08/07/2020 CLINICAL DATA:  Shortness of breath. CHF/COPD exacerbation. Evaluate infiltrate. EXAM: PORTABLE CHEST 1 VIEW COMPARISON:  August 04, 2020 FINDINGS: Coarsened interstitial markings bilaterally with more focal opacity in the left mid lower lung. Stable cardiomegaly. The hila and mediastinum are unchanged. No pneumothorax. No other acute abnormalities. IMPRESSION: 1. Coarsened interstitium bilaterally with more focal opacity in the left base. An infectious process/pneumonia in the left base is considered most likely. Asymmetric edema is considered less likely. Recommend clinical correlation and attention on follow-up. Electronically Signed   By: Gerome Sam III M.D  On: 08/07/2020 18:07   DG Chest Port 1 View  Result Date: 08/04/2020 CLINICAL DATA:  Dyspnea, respiratory distress EXAM: PORTABLE CHEST 1 VIEW COMPARISON:  06/19/2018 chest radiograph. FINDINGS: Low lung volumes. Stable cardiomediastinal silhouette with normal heart size. No pneumothorax. No pleural effusion. Hazy streaky bibasilar lung opacities. IMPRESSION: Low lung volumes. Hazy streaky bibasilar lung opacities, favor atelectasis. Electronically Signed   By: Delbert Phenix M.D.   On: 08/04/2020 19:45   ECHOCARDIOGRAM COMPLETE  Result Date: 08/08/2020    ECHOCARDIOGRAM REPORT   Patient Name:   Anthony Chambers Date of Exam: 08/08/2020 Medical Rec #:  [id number]      Height:       74.0 in Accession #:    [id number]     Weight:       250.0 lb Date of Birth:  February 21, 1945      BSA:          2.390 m Patient Age:    75 years       BP:           138/62 mmHg Patient  Gender: M              HR:           72 bpm. Exam Location:  [facility] Procedure: 2D Echo, Limited Color Doppler, Cardiac Doppler and Intracardiac            Opacification Agent Indications:     I50.9 Congestive Heart Failure  History:         Patient has prior history of Echocardiogram examinations. Risk                  Factors:Hypertension and Dyslipidemia. Schizophrenia.  Sonographer:     Humphrey Rolls RDCS (AE) Referring Phys:  [id number] Ready [facility] Diagnosing Phys: Lorine Bears MD  Sonographer Comments: Technically challenging study due to limited acoustic windows and Technically difficult study due to poor echo windows. Image acquisition challenging due to patient body habitus. IMPRESSIONS  1. Left ventricular ejection fraction, by estimation, is 55 to 60%. The left ventricle has normal function. Left ventricular endocardial border not optimally defined to evaluate regional wall motion. Left ventricular diastolic parameters are consistent with Grade I diastolic dysfunction (impaired relaxation).  2. Right ventricular systolic function is normal. The right ventricular size is normal. Tricuspid regurgitation signal is inadequate for assessing PA pressure.  3. The mitral valve was not well visualized. No evidence of mitral valve regurgitation. No evidence of mitral stenosis.  4. The aortic valve was not well visualized. Aortic valve regurgitation is not visualized. No aortic stenosis is present.  5. Challenging image quality. FINDINGS  Left Ventricle: Left ventricular ejection fraction, by estimation, is 55 to 60%. The left ventricle has normal function. Left ventricular endocardial border not optimally defined to evaluate regional wall motion. Definity contrast agent was given IV to delineate the left ventricular endocardial borders. The left ventricular internal cavity size was normal in size. There is no left ventricular hypertrophy. Left ventricular diastolic parameters are consistent with Grade I diastolic  dysfunction (impaired relaxation). Right Ventricle: The right ventricular size is normal. No increase in right ventricular wall thickness. Right ventricular systolic function is normal. Tricuspid regurgitation signal is inadequate for assessing PA pressure. Left Atrium: Left atrial size was normal in size. Right Atrium: Right atrial size was normal in size. Pericardium: There is no evidence of pericardial effusion. Mitral Valve: The mitral valve was not well visualized. No evidence of mitral valve  regurgitation. No evidence of mitral valve stenosis. MV peak gradient, 6.0 mmHg. The mean mitral valve gradient is 3.0 mmHg. Tricuspid Valve: The tricuspid valve is not well visualized. Tricuspid valve regurgitation is not demonstrated. No evidence of tricuspid stenosis. Aortic Valve: The aortic valve was not well visualized. Aortic valve regurgitation is not visualized. No aortic stenosis is present. Aortic valve mean gradient measures 4.0 mmHg. Aortic valve peak gradient measures 10.1 mmHg. Pulmonic Valve: The pulmonic valve was normal in structure. Pulmonic valve regurgitation is not visualized. No evidence of pulmonic stenosis. Aorta: The aortic root is normal in size and structure. Venous: The inferior vena cava was not well visualized. IAS/Shunts: No atrial level shunt detected by color flow Doppler.   Diastology LV e' medial:    9.03 cm/s LV E/e' medial:  7.7 LV e' lateral:   7.72 cm/s LV E/e' lateral: 9.0  AORTIC VALVE AV Vmax:           159.00 cm/s AV Vmean:          94.500 cm/s AV VTI:            0.331 m AV Peak Grad:      10.1 mmHg AV Mean Grad:      4.0 mmHg LVOT Vmax:         107.00 cm/s LVOT Vmean:        73.000 cm/s LVOT VTI:          0.231 m LVOT/AV VTI ratio: 0.70 MITRAL VALVE MV Area (PHT): 4.39 cm    SHUNTS MV Peak grad:  6.0 mmHg    Systemic VTI: 0.23 m MV Mean grad:  3.0 mmHg MV Vmax:       1.22 m/s MV Vmean:      78.7 cm/s MV Decel Time: 173 msec MV E velocity: 69.40 cm/s MV A velocity: 93.40 cm/s MV  E/A ratio:  0.74 Lorine Bears MD Electronically signed by Lorine Bears MD Signature Date/Time: 08/08/2020/10:16:10 AM    Final       Subjective: No fever, no confusion, no respiraotry distress.  Discharge Exam: Vitals:   08/11/20 0754 08/11/20 0757  BP:  (!) 148/84  Pulse:  (!) 59  Resp:  20  Temp:  97.9 F (36.6 C)  SpO2: 93% 92%   Vitals:   08/11/20 0429 08/11/20 0500 08/11/20 0754 08/11/20 0757  BP: 130/71   (!) 148/84  Pulse: (!) 56   (!) 59  Resp: 20   20  Temp: 97.8 F (36.6 C)   97.9 F (36.6 C)  TempSrc: Oral     SpO2: 93%  93% 92%  Weight:  109.1 kg      General: Pt is sleeping, very slow to rouse, but answers yes, no then falls back asleep, no acute distress Cardiovascular: RRR, nl S1-S2, no murmurs appreciated.   No LE edema.   Respiratory: Normal respiratory rate and rhythm.  CTAB without rales or wheezes. Abdominal: Abdomen soft and non-tender.  No distension or HSM.   Neuro/Psych: Strength symmetric in upper and lower extremities.  Judgment and insight appear impaired at baseline.   The results of significant diagnostics from this hospitalization (including imaging, microbiology, ancillary and laboratory) are listed below for reference.     Microbiology: Recent Results (from the past 240 hour(s))  Resp Panel by RT-PCR (Flu A&B, Covid) Nasopharyngeal Swab     Status: None   Collection Time: 08/04/20  7:37 PM   Specimen: Nasopharyngeal Swab; Nasopharyngeal(NP) swabs in vial transport medium  Result Value Ref Range Status   SARS Coronavirus 2 by RT PCR NEGATIVE NEGATIVE Final    Comment: (NOTE) SARS-CoV-2 target nucleic acids are NOT DETECTED.  The SARS-CoV-2 RNA is generally detectable in upper respiratory specimens during the acute phase of infection. The lowest concentration of SARS-CoV-2 viral copies this assay can detect is 138 copies/mL. A negative result does not preclude SARS-Cov-2 infection and should not be used as the sole basis for  treatment or other patient management decisions. A negative result may occur with  improper specimen collection/handling, submission of specimen other than nasopharyngeal swab, presence of viral mutation(s) within the areas targeted by this assay, and inadequate number of viral copies(<138 copies/mL). A negative result must be combined with clinical observations, patient history, and epidemiological information. The expected result is Negative.  Fact Sheet for Patients:  BloggerCourse.comhttps://www.fda.gov/media/152166/download  Fact Sheet for Healthcare Providers:  SeriousBroker.ithttps://www.fda.gov/media/152162/download  This test is no t yet approved or cleared by the [place] FDA and  has been authorized for detection and/or diagnosis of SARS-CoV-2 by FDA under an Emergency Use Authorization (EUA). This EUA will remain  in effect (meaning this test can be used) for the duration of the COVID-19 declaration under Section 564(b)(1) of the Act, 21 U.S.C.section 360bbb-3(b)(1), unless the authorization is terminated  or revoked sooner.       Influenza A by PCR NEGATIVE NEGATIVE Final   Influenza B by PCR NEGATIVE NEGATIVE Final    Comment: (NOTE) The Xpert Xpress SARS-CoV-2/FLU/RSV plus assay is intended as an aid in the diagnosis of influenza from Nasopharyngeal swab specimens and should not be used as a sole basis for treatment. Nasal washings and aspirates are unacceptable for Xpert Xpress SARS-CoV-2/FLU/RSV testing.  Fact Sheet for Patients: BloggerCourse.comhttps://www.fda.gov/media/152166/download  Fact Sheet for Healthcare Providers: SeriousBroker.ithttps://www.fda.gov/media/152162/download  This test is not yet approved or cleared by the [place] FDA and has been authorized for detection and/or diagnosis of SARS-CoV-2 by FDA under an Emergency Use Authorization (EUA). This EUA will remain in effect (meaning this test can be used) for the duration of the COVID-19 declaration under Section 564(b)(1) of the Act, 21  U.S.C. section 360bbb-3(b)(1), unless the authorization is terminated or revoked.  Performed at [facility], [address] [place], [place] [id number]   Resp Panel by RT-PCR (Flu A&B, Covid) Nasopharyngeal Swab     Status: None   Collection Time: 08/07/20  5:27 PM   Specimen: Nasopharyngeal Swab; Nasopharyngeal(NP) swabs in vial transport medium  Result Value Ref Range Status   SARS Coronavirus 2 by RT PCR NEGATIVE NEGATIVE Final    Comment: (NOTE) SARS-CoV-2 target nucleic acids are NOT DETECTED.  The SARS-CoV-2 RNA is generally detectable in upper respiratory specimens during the acute phase of infection. The lowest concentration of SARS-CoV-2 viral copies this assay can detect is 138 copies/mL. A negative result does not preclude SARS-Cov-2 infection and should not be used as the sole basis for treatment or other patient management decisions. A negative result may occur with  improper specimen collection/handling, submission of specimen other than nasopharyngeal swab, presence of viral mutation(s) within the areas targeted by this assay, and inadequate number of viral copies(<138 copies/mL). A negative result must be combined with clinical observations, patient history, and epidemiological information. The expected result is Negative.  Fact Sheet for Patients:  BloggerCourse.comhttps://www.fda.gov/media/152166/download  Fact Sheet for Healthcare Providers:  SeriousBroker.ithttps://www.fda.gov/media/152162/download  This test is no t yet approved or cleared by the [place] FDA and  has been authorized for detection  and/or diagnosis of SARS-CoV-2 by FDA under an Emergency Use Authorization (EUA). This EUA will remain  in effect (meaning this test can be used) for the duration of the COVID-19 declaration under Section 564(b)(1) of the Act, 21 U.S.C.section 360bbb-3(b)(1), unless the authorization is terminated  or revoked sooner.       Influenza A by PCR NEGATIVE NEGATIVE Final    Influenza B by PCR NEGATIVE NEGATIVE Final    Comment: (NOTE) The Xpert Xpress SARS-CoV-2/FLU/RSV plus assay is intended as an aid in the diagnosis of influenza from Nasopharyngeal swab specimens and should not be used as a sole basis for treatment. Nasal washings and aspirates are unacceptable for Xpert Xpress SARS-CoV-2/FLU/RSV testing.  Fact Sheet for Patients: BloggerCourse.com  Fact Sheet for Healthcare Providers: SeriousBroker.it  This test is not yet approved or cleared by the [place] FDA and has been authorized for detection and/or diagnosis of SARS-CoV-2 by FDA under an Emergency Use Authorization (EUA). This EUA will remain in effect (meaning this test can be used) for the duration of the COVID-19 declaration under Section 564(b)(1) of the Act, 21 U.S.C. section 360bbb-3(b)(1), unless the authorization is terminated or revoked.  Performed at [facility], [address] [place], [place] [address]   SARS CORONAVIRUS 2 (TAT 6-24 HRS) Nasopharyngeal Nasopharyngeal Swab     Status: None   Collection Time: 08/10/20 10:15 AM   Specimen: Nasopharyngeal Swab  Result Value Ref Range Status   SARS Coronavirus 2 NEGATIVE NEGATIVE Final    Comment: (NOTE) SARS-CoV-2 target nucleic acids are NOT DETECTED.  The SARS-CoV-2 RNA is generally detectable in upper and lower respiratory specimens during the acute phase of infection. Negative results do not preclude SARS-CoV-2 infection, do not rule out co-infections with other pathogens, and should not be used as the sole basis for treatment or other patient management decisions. Negative results must be combined with clinical observations, patient history, and epidemiological information. The expected result is Negative.  Fact Sheet for Patients: HairSlick.no  Fact Sheet for Healthcare  Providers: quierodirigir.com  This test is not yet approved or cleared by the [place] FDA and  has been authorized for detection and/or diagnosis of SARS-CoV-2 by FDA under an Emergency Use Authorization (EUA). This EUA will remain  in effect (meaning this test can be used) for the duration of the COVID-19 declaration under Se ction 564(b)(1) of the Act, 21 U.S.C. section 360bbb-3(b)(1), unless the authorization is terminated or revoked sooner.  Performed at [facility], [address] [place], [place] [address]      Labs: BNP (last 3 results) Recent Labs    08/04/20 1927 08/07/20 1825  BNP 216.9* 377.7*   Basic Metabolic Panel: Recent Labs  Lab 08/07/20 1727 08/08/20 0403 08/09/20 0602 08/10/20 0503 08/11/20 0516  NA 141 141 139 137 139  K 4.1 4.1 3.4* 4.6 4.1  CL 99 95* 93* 92* 95*  CO2 41* 36* 37* 37* 36*  GLUCOSE 127* 149* 126* 133* 135*  BUN 20 22 30* 43* 47*  CREATININE 0.98 0.93 1.00 1.34* 1.15  CALCIUM 8.3* 8.0* 8.2* 8.6* 8.4*  MG 2.4 2.4  --  2.3  --    Liver Function Tests: Recent Labs  Lab 08/07/20 1727  AST 16  ALT 12  ALKPHOS 66  BILITOT 0.4  PROT 6.5  ALBUMIN 3.0*   No results for input(s): LIPASE, AMYLASE in the last 168 hours. No results for input(s): AMMONIA in the last 168 hours. CBC: Recent  Labs  Lab 08/04/20 1927 08/07/20 1727 08/08/20 0403 08/09/20 0602  WBC 5.4 6.1 7.9 9.0  NEUTROABS 4.5 3.9  --  7.9*  HGB 12.6* 12.0* 12.9* 12.9*  HCT 40.1 38.6* 41.4 39.8  MCV 93.9 97.0 96.3 91.3  PLT 135* 128* 139* 177   Cardiac Enzymes: No results for input(s): CKTOTAL, CKMB, CKMBINDEX, TROPONINI in the last 168 hours. BNP: Invalid input(s): POCBNP CBG: No results for input(s): GLUCAP in the last 168 hours. D-Dimer No results for input(s): DDIMER in the last 72 hours. Hgb A1c No results for input(s): HGBA1C in the last 72 hours. Lipid Profile No results for input(s): CHOL, HDL, LDLCALC, TRIG,  CHOLHDL, LDLDIRECT in the last 72 hours. Thyroid function studies No results for input(s): TSH, T4TOTAL, T3FREE, THYROIDAB in the last 72 hours.  Invalid input(s): FREET3 Anemia work up No results for input(s): VITAMINB12, FOLATE, FERRITIN, TIBC, IRON, RETICCTPCT in the last 72 hours. Urinalysis No results found for: COLORURINE, APPEARANCEUR, LABSPEC, PHURINE, GLUCOSEU, HGBUR, BILIRUBINUR, KETONESUR, PROTEINUR, UROBILINOGEN, NITRITE, LEUKOCYTESUR Sepsis Labs Invalid input(s): PROCALCITONIN,  WBC,  LACTICIDVEN Microbiology Recent Results (from the past 240 hour(s))  Resp Panel by RT-PCR (Flu A&B, Covid) Nasopharyngeal Swab     Status: None   Collection Time: 08/04/20  7:37 PM   Specimen: Nasopharyngeal Swab; Nasopharyngeal(NP) swabs in vial transport medium  Result Value Ref Range Status   SARS Coronavirus 2 by RT PCR NEGATIVE NEGATIVE Final    Comment: (NOTE) SARS-CoV-2 target nucleic acids are NOT DETECTED.  The SARS-CoV-2 RNA is generally detectable in upper respiratory specimens during the acute phase of infection. The lowest concentration of SARS-CoV-2 viral copies this assay can detect is 138 copies/mL. A negative result does not preclude SARS-Cov-2 infection and should not be used as the sole basis for treatment or other patient management decisions. A negative result may occur with  improper specimen collection/handling, submission of specimen other than nasopharyngeal swab, presence of viral mutation(s) within the areas targeted by this assay, and inadequate number of viral copies(<138 copies/mL). A negative result must be combined with clinical observations, patient history, and epidemiological information. The expected result is Negative.  Fact Sheet for Patients:  BloggerCourse.com  Fact Sheet for Healthcare Providers:  SeriousBroker.it  This test is no t yet approved or cleared by the [place] FDA and  has been  authorized for detection and/or diagnosis of SARS-CoV-2 by FDA under an Emergency Use Authorization (EUA). This EUA will remain  in effect (meaning this test can be used) for the duration of the COVID-19 declaration under Section 564(b)(1) of the Act, 21 U.S.C.section 360bbb-3(b)(1), unless the authorization is terminated  or revoked sooner.       Influenza A by PCR NEGATIVE NEGATIVE Final   Influenza B by PCR NEGATIVE NEGATIVE Final    Comment: (NOTE) The Xpert Xpress SARS-CoV-2/FLU/RSV plus assay is intended as an aid in the diagnosis of influenza from Nasopharyngeal swab specimens and should not be used as a sole basis for treatment. Nasal washings and aspirates are unacceptable for Xpert Xpress SARS-CoV-2/FLU/RSV testing.  Fact Sheet for Patients: BloggerCourse.com  Fact Sheet for Healthcare Providers: SeriousBroker.it  This test is not yet approved or cleared by the [place] FDA and has been authorized for detection and/or diagnosis of SARS-CoV-2 by FDA under an Emergency Use Authorization (EUA). This EUA will remain in effect (meaning this test can be used) for the duration of the COVID-19 declaration under Section 564(b)(1) of the Act, 21 U.S.C. section 360bbb-3(b)(1), unless the  authorization is terminated or revoked.  Performed at [facility], [address] [place], [place] [address]   Resp Panel by RT-PCR (Flu A&B, Covid) Nasopharyngeal Swab     Status: None   Collection Time: 08/07/20  5:27 PM   Specimen: Nasopharyngeal Swab; Nasopharyngeal(NP) swabs in vial transport medium  Result Value Ref Range Status   SARS Coronavirus 2 by RT PCR NEGATIVE NEGATIVE Final    Comment: (NOTE) SARS-CoV-2 target nucleic acids are NOT DETECTED.  The SARS-CoV-2 RNA is generally detectable in upper respiratory specimens during the acute phase of infection. The lowest concentration of SARS-CoV-2 viral copies this  assay can detect is 138 copies/mL. A negative result does not preclude SARS-Cov-2 infection and should not be used as the sole basis for treatment or other patient management decisions. A negative result may occur with  improper specimen collection/handling, submission of specimen other than nasopharyngeal swab, presence of viral mutation(s) within the areas targeted by this assay, and inadequate number of viral copies(<138 copies/mL). A negative result must be combined with clinical observations, patient history, and epidemiological information. The expected result is Negative.  Fact Sheet for Patients:  BloggerCourse.com  Fact Sheet for Healthcare Providers:  SeriousBroker.it  This test is no t yet approved or cleared by the [place] FDA and  has been authorized for detection and/or diagnosis of SARS-CoV-2 by FDA under an Emergency Use Authorization (EUA). This EUA will remain  in effect (meaning this test can be used) for the duration of the COVID-19 declaration under Section 564(b)(1) of the Act, 21 U.S.C.section 360bbb-3(b)(1), unless the authorization is terminated  or revoked sooner.       Influenza A by PCR NEGATIVE NEGATIVE Final   Influenza B by PCR NEGATIVE NEGATIVE Final    Comment: (NOTE) The Xpert Xpress SARS-CoV-2/FLU/RSV plus assay is intended as an aid in the diagnosis of influenza from Nasopharyngeal swab specimens and should not be used as a sole basis for treatment. Nasal washings and aspirates are unacceptable for Xpert Xpress SARS-CoV-2/FLU/RSV testing.  Fact Sheet for Patients: BloggerCourse.com  Fact Sheet for Healthcare Providers: SeriousBroker.it  This test is not yet approved or cleared by the [place] FDA and has been authorized for detection and/or diagnosis of SARS-CoV-2 by FDA under an Emergency Use Authorization (EUA). This EUA will  remain in effect (meaning this test can be used) for the duration of the COVID-19 declaration under Section 564(b)(1) of the Act, 21 U.S.C. section 360bbb-3(b)(1), unless the authorization is terminated or revoked.  Performed at [facility], [address] [place], [place] [address]   SARS CORONAVIRUS 2 (TAT 6-24 HRS) Nasopharyngeal Nasopharyngeal Swab     Status: None   Collection Time: 08/10/20 10:15 AM   Specimen: Nasopharyngeal Swab  Result Value Ref Range Status   SARS Coronavirus 2 NEGATIVE NEGATIVE Final    Comment: (NOTE) SARS-CoV-2 target nucleic acids are NOT DETECTED.  The SARS-CoV-2 RNA is generally detectable in upper and lower respiratory specimens during the acute phase of infection. Negative results do not preclude SARS-CoV-2 infection, do not rule out co-infections with other pathogens, and should not be used as the sole basis for treatment or other patient management decisions. Negative results must be combined with clinical observations, patient history, and epidemiological information. The expected result is Negative.  Fact Sheet for Patients: HairSlick.no  Fact Sheet for Healthcare Providers: quierodirigir.com  This test is not yet approved or cleared by the [place] FDA and  has been authorized for  detection and/or diagnosis of SARS-CoV-2 by FDA under an Emergency Use Authorization (EUA). This EUA will remain  in effect (meaning this test can be used) for the duration of the COVID-19 declaration under Se ction 564(b)(1) of the Act, 21 U.S.C. section 360bbb-3(b)(1), unless the authorization is terminated or revoked sooner.  Performed at [facility], [address] [place], [place] [address]      Time coordinating discharge: 25 minutes The [place] controlled substances registry was reviewed for this patient       30 Day Unplanned Readmission Risk Score    Flowsheet Row ED  to Hosp-Admission (Current) from 08/07/2020 in [facility]  30 Day Unplanned Readmission Risk Score (%) 18.81 Filed at 08/11/2020 0801       This score is the patient's risk of an unplanned readmission within 30 days of being discharged (0 -100%). The score is based on dignosis, age, lab data, medications, orders, and past utilization.   Low:  0-14.9   Medium: 15-21.9   High: 22-29.9   Extreme: 30 and above            SIGNED:   Alberteen Sam, MD  [facility] 08/11/2020, 8:26 AM

## 2020-08-11 NOTE — Progress Notes (Signed)
Report called to [facility] and provided to nurse Natalia. NAD noted at this time and will continue to monitor for any needs.

## 2020-08-11 NOTE — Plan of Care (Signed)

## 2020-08-11 NOTE — Care Management Important Message (Signed)
Important Message  Patient Details  Name: Anthony Chambers MRN: [id number] Date of Birth: 09/26/1945   Medicare Important Message Given:  N/A - LOS <3 / Initial given by admissions  Initial Medicare IM reviewed with [name], sister by Caprice Renshaw, Patient Access Associate on 08/10/2020 at 8:57am.     Johnell Comings 08/11/2020, 11:52 AM

## 2020-08-11 NOTE — Progress Notes (Addendum)
Mobility Specialist - Progress Note   08/11/20 1200  Mobility  Range of Motion/Exercises Right leg;Left leg  Assistive Device None  Distance Ambulated (ft) 0 ft  Mobility performed by Mobility specialist  $Mobility charge 1 Mobility    2nd attempt this date, pt sleeping on arrival. Utilizing 4L. Pt awakened by gentle touch. Pt began with performing ankle pumps on cue, but participation limited d/t pt appearing lethargic and quickly returning to sleep. Further activity deferred. HR 63 bpm, O2 94%. RN notified.   Filiberto Pinks Mobility Specialist 08/11/20, 12:58 PM

## 2020-08-11 NOTE — Plan of Care (Signed)
  Problem: Clinical Measurements: Goal: Ability to maintain clinical measurements within normal limits will improve Outcome: Progressing Goal: Will remain free from infection Outcome: Progressing Goal: Diagnostic test results will improve Outcome: Progressing Goal: Respiratory complications will improve Outcome: Progressing Goal: Cardiovascular complication will be avoided Outcome: Progressing   Problem: Pain Managment: Goal: General experience of comfort will improve Outcome: Progressing  Pt is calm and cooperative. Alert and oriented x 2. Kept on chronic oxygen at 4lpm/[place] with oxygen saturation at 93%. No complaints of pain. V/S except HR bradycardic.

## 2020-08-12 DIAGNOSIS — Z8616 Personal history of COVID-19: Secondary | ICD-10-CM | POA: Diagnosis not present

## 2020-08-12 DIAGNOSIS — J441 Chronic obstructive pulmonary disease with (acute) exacerbation: Secondary | ICD-10-CM | POA: Diagnosis not present

## 2020-08-12 DIAGNOSIS — E785 Hyperlipidemia, unspecified: Secondary | ICD-10-CM | POA: Diagnosis not present

## 2020-08-12 DIAGNOSIS — K219 Gastro-esophageal reflux disease without esophagitis: Secondary | ICD-10-CM | POA: Diagnosis not present

## 2020-08-12 DIAGNOSIS — I10 Essential (primary) hypertension: Secondary | ICD-10-CM | POA: Diagnosis not present

## 2020-08-12 DIAGNOSIS — I509 Heart failure, unspecified: Secondary | ICD-10-CM | POA: Diagnosis not present

## 2020-08-12 DIAGNOSIS — R52 Pain, unspecified: Secondary | ICD-10-CM | POA: Diagnosis not present

## 2020-08-12 DIAGNOSIS — E539 Vitamin B deficiency, unspecified: Secondary | ICD-10-CM | POA: Diagnosis not present

## 2020-08-12 DIAGNOSIS — R1312 Dysphagia, oropharyngeal phase: Secondary | ICD-10-CM | POA: Diagnosis not present

## 2020-08-12 DIAGNOSIS — I2582 Chronic total occlusion of coronary artery: Secondary | ICD-10-CM | POA: Diagnosis not present

## 2020-08-12 DIAGNOSIS — I739 Peripheral vascular disease, unspecified: Secondary | ICD-10-CM | POA: Diagnosis not present

## 2020-08-15 DIAGNOSIS — I2582 Chronic total occlusion of coronary artery: Secondary | ICD-10-CM | POA: Diagnosis not present

## 2020-08-15 DIAGNOSIS — J441 Chronic obstructive pulmonary disease with (acute) exacerbation: Secondary | ICD-10-CM | POA: Diagnosis not present

## 2020-08-15 DIAGNOSIS — I739 Peripheral vascular disease, unspecified: Secondary | ICD-10-CM | POA: Diagnosis not present

## 2020-08-15 DIAGNOSIS — E539 Vitamin B deficiency, unspecified: Secondary | ICD-10-CM | POA: Diagnosis not present

## 2020-08-15 DIAGNOSIS — E785 Hyperlipidemia, unspecified: Secondary | ICD-10-CM | POA: Diagnosis not present

## 2020-08-15 DIAGNOSIS — R52 Pain, unspecified: Secondary | ICD-10-CM | POA: Diagnosis not present

## 2020-08-15 DIAGNOSIS — R1312 Dysphagia, oropharyngeal phase: Secondary | ICD-10-CM | POA: Diagnosis not present

## 2020-08-15 DIAGNOSIS — K219 Gastro-esophageal reflux disease without esophagitis: Secondary | ICD-10-CM | POA: Diagnosis not present

## 2020-08-15 DIAGNOSIS — I509 Heart failure, unspecified: Secondary | ICD-10-CM | POA: Diagnosis not present

## 2020-08-15 DIAGNOSIS — Z8616 Personal history of COVID-19: Secondary | ICD-10-CM | POA: Diagnosis not present

## 2020-08-15 DIAGNOSIS — I10 Essential (primary) hypertension: Secondary | ICD-10-CM | POA: Diagnosis not present

## 2020-08-16 DIAGNOSIS — E785 Hyperlipidemia, unspecified: Secondary | ICD-10-CM | POA: Diagnosis not present

## 2020-08-16 DIAGNOSIS — I509 Heart failure, unspecified: Secondary | ICD-10-CM | POA: Diagnosis not present

## 2020-08-16 DIAGNOSIS — R52 Pain, unspecified: Secondary | ICD-10-CM | POA: Diagnosis not present

## 2020-08-16 DIAGNOSIS — K219 Gastro-esophageal reflux disease without esophagitis: Secondary | ICD-10-CM | POA: Diagnosis not present

## 2020-08-16 DIAGNOSIS — E539 Vitamin B deficiency, unspecified: Secondary | ICD-10-CM | POA: Diagnosis not present

## 2020-08-16 DIAGNOSIS — I2582 Chronic total occlusion of coronary artery: Secondary | ICD-10-CM | POA: Diagnosis not present

## 2020-08-16 DIAGNOSIS — Z8616 Personal history of COVID-19: Secondary | ICD-10-CM | POA: Diagnosis not present

## 2020-08-16 DIAGNOSIS — I739 Peripheral vascular disease, unspecified: Secondary | ICD-10-CM | POA: Diagnosis not present

## 2020-08-16 DIAGNOSIS — R1312 Dysphagia, oropharyngeal phase: Secondary | ICD-10-CM | POA: Diagnosis not present

## 2020-08-16 DIAGNOSIS — J441 Chronic obstructive pulmonary disease with (acute) exacerbation: Secondary | ICD-10-CM | POA: Diagnosis not present

## 2020-08-17 DIAGNOSIS — E539 Vitamin B deficiency, unspecified: Secondary | ICD-10-CM | POA: Diagnosis not present

## 2020-08-17 DIAGNOSIS — I2582 Chronic total occlusion of coronary artery: Secondary | ICD-10-CM | POA: Diagnosis not present

## 2020-08-17 DIAGNOSIS — J441 Chronic obstructive pulmonary disease with (acute) exacerbation: Secondary | ICD-10-CM | POA: Diagnosis not present

## 2020-08-17 DIAGNOSIS — R52 Pain, unspecified: Secondary | ICD-10-CM | POA: Diagnosis not present

## 2020-08-17 DIAGNOSIS — E785 Hyperlipidemia, unspecified: Secondary | ICD-10-CM | POA: Diagnosis not present

## 2020-08-17 DIAGNOSIS — I509 Heart failure, unspecified: Secondary | ICD-10-CM | POA: Diagnosis not present

## 2020-08-17 DIAGNOSIS — K219 Gastro-esophageal reflux disease without esophagitis: Secondary | ICD-10-CM | POA: Diagnosis not present

## 2020-08-17 DIAGNOSIS — R1312 Dysphagia, oropharyngeal phase: Secondary | ICD-10-CM | POA: Diagnosis not present

## 2020-08-17 DIAGNOSIS — Z8616 Personal history of COVID-19: Secondary | ICD-10-CM | POA: Diagnosis not present

## 2020-08-17 DIAGNOSIS — I739 Peripheral vascular disease, unspecified: Secondary | ICD-10-CM | POA: Diagnosis not present

## 2020-08-18 DIAGNOSIS — K219 Gastro-esophageal reflux disease without esophagitis: Secondary | ICD-10-CM | POA: Diagnosis not present

## 2020-08-18 DIAGNOSIS — I739 Peripheral vascular disease, unspecified: Secondary | ICD-10-CM | POA: Diagnosis not present

## 2020-08-18 DIAGNOSIS — D638 Anemia in other chronic diseases classified elsewhere: Secondary | ICD-10-CM | POA: Diagnosis not present

## 2020-08-18 DIAGNOSIS — J441 Chronic obstructive pulmonary disease with (acute) exacerbation: Secondary | ICD-10-CM | POA: Diagnosis not present

## 2020-08-18 DIAGNOSIS — R1312 Dysphagia, oropharyngeal phase: Secondary | ICD-10-CM | POA: Diagnosis not present

## 2020-08-18 DIAGNOSIS — I10 Essential (primary) hypertension: Secondary | ICD-10-CM | POA: Diagnosis not present

## 2020-08-18 DIAGNOSIS — I2582 Chronic total occlusion of coronary artery: Secondary | ICD-10-CM | POA: Diagnosis not present

## 2020-08-18 DIAGNOSIS — E785 Hyperlipidemia, unspecified: Secondary | ICD-10-CM | POA: Diagnosis not present

## 2020-08-18 DIAGNOSIS — I509 Heart failure, unspecified: Secondary | ICD-10-CM | POA: Diagnosis not present

## 2020-08-18 DIAGNOSIS — R52 Pain, unspecified: Secondary | ICD-10-CM | POA: Diagnosis not present

## 2020-08-18 DIAGNOSIS — Z8616 Personal history of COVID-19: Secondary | ICD-10-CM | POA: Diagnosis not present

## 2020-08-18 DIAGNOSIS — E539 Vitamin B deficiency, unspecified: Secondary | ICD-10-CM | POA: Diagnosis not present

## 2020-08-19 DIAGNOSIS — I509 Heart failure, unspecified: Secondary | ICD-10-CM | POA: Diagnosis not present

## 2020-08-19 DIAGNOSIS — I2582 Chronic total occlusion of coronary artery: Secondary | ICD-10-CM | POA: Diagnosis not present

## 2020-08-19 DIAGNOSIS — K219 Gastro-esophageal reflux disease without esophagitis: Secondary | ICD-10-CM | POA: Diagnosis not present

## 2020-08-19 DIAGNOSIS — J441 Chronic obstructive pulmonary disease with (acute) exacerbation: Secondary | ICD-10-CM | POA: Diagnosis not present

## 2020-08-19 DIAGNOSIS — E785 Hyperlipidemia, unspecified: Secondary | ICD-10-CM | POA: Diagnosis not present

## 2020-08-19 DIAGNOSIS — R1312 Dysphagia, oropharyngeal phase: Secondary | ICD-10-CM | POA: Diagnosis not present

## 2020-08-19 DIAGNOSIS — I739 Peripheral vascular disease, unspecified: Secondary | ICD-10-CM | POA: Diagnosis not present

## 2020-08-19 DIAGNOSIS — Z8616 Personal history of COVID-19: Secondary | ICD-10-CM | POA: Diagnosis not present

## 2020-08-19 DIAGNOSIS — R52 Pain, unspecified: Secondary | ICD-10-CM | POA: Diagnosis not present

## 2020-08-19 DIAGNOSIS — E539 Vitamin B deficiency, unspecified: Secondary | ICD-10-CM | POA: Diagnosis not present

## 2020-08-19 DIAGNOSIS — I10 Essential (primary) hypertension: Secondary | ICD-10-CM | POA: Diagnosis not present

## 2020-08-22 DIAGNOSIS — I2582 Chronic total occlusion of coronary artery: Secondary | ICD-10-CM | POA: Diagnosis not present

## 2020-08-22 DIAGNOSIS — E785 Hyperlipidemia, unspecified: Secondary | ICD-10-CM | POA: Diagnosis not present

## 2020-08-22 DIAGNOSIS — I10 Essential (primary) hypertension: Secondary | ICD-10-CM | POA: Diagnosis not present

## 2020-08-22 DIAGNOSIS — Z8616 Personal history of COVID-19: Secondary | ICD-10-CM | POA: Diagnosis not present

## 2020-08-22 DIAGNOSIS — R1312 Dysphagia, oropharyngeal phase: Secondary | ICD-10-CM | POA: Diagnosis not present

## 2020-08-22 DIAGNOSIS — E539 Vitamin B deficiency, unspecified: Secondary | ICD-10-CM | POA: Diagnosis not present

## 2020-08-22 DIAGNOSIS — I739 Peripheral vascular disease, unspecified: Secondary | ICD-10-CM | POA: Diagnosis not present

## 2020-08-22 DIAGNOSIS — K219 Gastro-esophageal reflux disease without esophagitis: Secondary | ICD-10-CM | POA: Diagnosis not present

## 2020-08-22 DIAGNOSIS — J441 Chronic obstructive pulmonary disease with (acute) exacerbation: Secondary | ICD-10-CM | POA: Diagnosis not present

## 2020-08-22 DIAGNOSIS — R52 Pain, unspecified: Secondary | ICD-10-CM | POA: Diagnosis not present

## 2020-08-22 DIAGNOSIS — I509 Heart failure, unspecified: Secondary | ICD-10-CM | POA: Diagnosis not present

## 2020-08-23 DIAGNOSIS — I2582 Chronic total occlusion of coronary artery: Secondary | ICD-10-CM | POA: Diagnosis not present

## 2020-08-23 DIAGNOSIS — Z8616 Personal history of COVID-19: Secondary | ICD-10-CM | POA: Diagnosis not present

## 2020-08-23 DIAGNOSIS — I739 Peripheral vascular disease, unspecified: Secondary | ICD-10-CM | POA: Diagnosis not present

## 2020-08-23 DIAGNOSIS — E785 Hyperlipidemia, unspecified: Secondary | ICD-10-CM | POA: Diagnosis not present

## 2020-08-23 DIAGNOSIS — R52 Pain, unspecified: Secondary | ICD-10-CM | POA: Diagnosis not present

## 2020-08-23 DIAGNOSIS — K219 Gastro-esophageal reflux disease without esophagitis: Secondary | ICD-10-CM | POA: Diagnosis not present

## 2020-08-23 DIAGNOSIS — R1312 Dysphagia, oropharyngeal phase: Secondary | ICD-10-CM | POA: Diagnosis not present

## 2020-08-23 DIAGNOSIS — J441 Chronic obstructive pulmonary disease with (acute) exacerbation: Secondary | ICD-10-CM | POA: Diagnosis not present

## 2020-08-23 DIAGNOSIS — E539 Vitamin B deficiency, unspecified: Secondary | ICD-10-CM | POA: Diagnosis not present

## 2020-08-23 DIAGNOSIS — I509 Heart failure, unspecified: Secondary | ICD-10-CM | POA: Diagnosis not present

## 2020-08-24 DIAGNOSIS — I739 Peripheral vascular disease, unspecified: Secondary | ICD-10-CM | POA: Diagnosis not present

## 2020-08-24 DIAGNOSIS — R52 Pain, unspecified: Secondary | ICD-10-CM | POA: Diagnosis not present

## 2020-08-24 DIAGNOSIS — I2582 Chronic total occlusion of coronary artery: Secondary | ICD-10-CM | POA: Diagnosis not present

## 2020-08-24 DIAGNOSIS — E539 Vitamin B deficiency, unspecified: Secondary | ICD-10-CM | POA: Diagnosis not present

## 2020-08-24 DIAGNOSIS — K219 Gastro-esophageal reflux disease without esophagitis: Secondary | ICD-10-CM | POA: Diagnosis not present

## 2020-08-24 DIAGNOSIS — Z8616 Personal history of COVID-19: Secondary | ICD-10-CM | POA: Diagnosis not present

## 2020-08-24 DIAGNOSIS — R1312 Dysphagia, oropharyngeal phase: Secondary | ICD-10-CM | POA: Diagnosis not present

## 2020-08-24 DIAGNOSIS — E785 Hyperlipidemia, unspecified: Secondary | ICD-10-CM | POA: Diagnosis not present

## 2020-08-24 DIAGNOSIS — I509 Heart failure, unspecified: Secondary | ICD-10-CM | POA: Diagnosis not present

## 2020-08-24 DIAGNOSIS — J441 Chronic obstructive pulmonary disease with (acute) exacerbation: Secondary | ICD-10-CM | POA: Diagnosis not present

## 2020-08-25 DIAGNOSIS — Z8616 Personal history of COVID-19: Secondary | ICD-10-CM | POA: Diagnosis not present

## 2020-08-25 DIAGNOSIS — I2582 Chronic total occlusion of coronary artery: Secondary | ICD-10-CM | POA: Diagnosis not present

## 2020-08-25 DIAGNOSIS — E785 Hyperlipidemia, unspecified: Secondary | ICD-10-CM | POA: Diagnosis not present

## 2020-08-25 DIAGNOSIS — J441 Chronic obstructive pulmonary disease with (acute) exacerbation: Secondary | ICD-10-CM | POA: Diagnosis not present

## 2020-08-25 DIAGNOSIS — R1312 Dysphagia, oropharyngeal phase: Secondary | ICD-10-CM | POA: Diagnosis not present

## 2020-08-25 DIAGNOSIS — K219 Gastro-esophageal reflux disease without esophagitis: Secondary | ICD-10-CM | POA: Diagnosis not present

## 2020-08-25 DIAGNOSIS — I509 Heart failure, unspecified: Secondary | ICD-10-CM | POA: Diagnosis not present

## 2020-08-25 DIAGNOSIS — I10 Essential (primary) hypertension: Secondary | ICD-10-CM | POA: Diagnosis not present

## 2020-08-25 DIAGNOSIS — E539 Vitamin B deficiency, unspecified: Secondary | ICD-10-CM | POA: Diagnosis not present

## 2020-08-25 DIAGNOSIS — R52 Pain, unspecified: Secondary | ICD-10-CM | POA: Diagnosis not present

## 2020-08-25 DIAGNOSIS — I739 Peripheral vascular disease, unspecified: Secondary | ICD-10-CM | POA: Diagnosis not present

## 2020-08-26 DIAGNOSIS — E785 Hyperlipidemia, unspecified: Secondary | ICD-10-CM | POA: Diagnosis not present

## 2020-08-26 DIAGNOSIS — R52 Pain, unspecified: Secondary | ICD-10-CM | POA: Diagnosis not present

## 2020-08-26 DIAGNOSIS — I509 Heart failure, unspecified: Secondary | ICD-10-CM | POA: Diagnosis not present

## 2020-08-26 DIAGNOSIS — E119 Type 2 diabetes mellitus without complications: Secondary | ICD-10-CM | POA: Diagnosis not present

## 2020-08-26 DIAGNOSIS — E539 Vitamin B deficiency, unspecified: Secondary | ICD-10-CM | POA: Diagnosis not present

## 2020-08-26 DIAGNOSIS — Z8616 Personal history of COVID-19: Secondary | ICD-10-CM | POA: Diagnosis not present

## 2020-08-26 DIAGNOSIS — R1312 Dysphagia, oropharyngeal phase: Secondary | ICD-10-CM | POA: Diagnosis not present

## 2020-08-26 DIAGNOSIS — I739 Peripheral vascular disease, unspecified: Secondary | ICD-10-CM | POA: Diagnosis not present

## 2020-08-26 DIAGNOSIS — I2582 Chronic total occlusion of coronary artery: Secondary | ICD-10-CM | POA: Diagnosis not present

## 2020-08-26 DIAGNOSIS — K219 Gastro-esophageal reflux disease without esophagitis: Secondary | ICD-10-CM | POA: Diagnosis not present

## 2020-08-26 DIAGNOSIS — J441 Chronic obstructive pulmonary disease with (acute) exacerbation: Secondary | ICD-10-CM | POA: Diagnosis not present

## 2020-08-28 DIAGNOSIS — Z8616 Personal history of COVID-19: Secondary | ICD-10-CM | POA: Diagnosis not present

## 2020-08-28 DIAGNOSIS — J441 Chronic obstructive pulmonary disease with (acute) exacerbation: Secondary | ICD-10-CM | POA: Diagnosis not present

## 2020-08-28 DIAGNOSIS — E785 Hyperlipidemia, unspecified: Secondary | ICD-10-CM | POA: Diagnosis not present

## 2020-08-28 DIAGNOSIS — R1312 Dysphagia, oropharyngeal phase: Secondary | ICD-10-CM | POA: Diagnosis not present

## 2020-08-28 DIAGNOSIS — R52 Pain, unspecified: Secondary | ICD-10-CM | POA: Diagnosis not present

## 2020-08-28 DIAGNOSIS — I509 Heart failure, unspecified: Secondary | ICD-10-CM | POA: Diagnosis not present

## 2020-08-28 DIAGNOSIS — K219 Gastro-esophageal reflux disease without esophagitis: Secondary | ICD-10-CM | POA: Diagnosis not present

## 2020-08-28 DIAGNOSIS — E539 Vitamin B deficiency, unspecified: Secondary | ICD-10-CM | POA: Diagnosis not present

## 2020-08-28 DIAGNOSIS — I739 Peripheral vascular disease, unspecified: Secondary | ICD-10-CM | POA: Diagnosis not present

## 2020-08-28 DIAGNOSIS — I2582 Chronic total occlusion of coronary artery: Secondary | ICD-10-CM | POA: Diagnosis not present

## 2020-08-29 DIAGNOSIS — R1312 Dysphagia, oropharyngeal phase: Secondary | ICD-10-CM | POA: Diagnosis not present

## 2020-08-29 DIAGNOSIS — I2582 Chronic total occlusion of coronary artery: Secondary | ICD-10-CM | POA: Diagnosis not present

## 2020-08-29 DIAGNOSIS — E785 Hyperlipidemia, unspecified: Secondary | ICD-10-CM | POA: Diagnosis not present

## 2020-08-29 DIAGNOSIS — E539 Vitamin B deficiency, unspecified: Secondary | ICD-10-CM | POA: Diagnosis not present

## 2020-08-29 DIAGNOSIS — I739 Peripheral vascular disease, unspecified: Secondary | ICD-10-CM | POA: Diagnosis not present

## 2020-08-29 DIAGNOSIS — J441 Chronic obstructive pulmonary disease with (acute) exacerbation: Secondary | ICD-10-CM | POA: Diagnosis not present

## 2020-08-29 DIAGNOSIS — Z8616 Personal history of COVID-19: Secondary | ICD-10-CM | POA: Diagnosis not present

## 2020-08-29 DIAGNOSIS — I10 Essential (primary) hypertension: Secondary | ICD-10-CM | POA: Diagnosis not present

## 2020-08-29 DIAGNOSIS — I509 Heart failure, unspecified: Secondary | ICD-10-CM | POA: Diagnosis not present

## 2020-08-29 DIAGNOSIS — R52 Pain, unspecified: Secondary | ICD-10-CM | POA: Diagnosis not present

## 2020-08-29 DIAGNOSIS — K219 Gastro-esophageal reflux disease without esophagitis: Secondary | ICD-10-CM | POA: Diagnosis not present

## 2020-08-29 DIAGNOSIS — I872 Venous insufficiency (chronic) (peripheral): Secondary | ICD-10-CM | POA: Diagnosis not present

## 2020-08-30 DIAGNOSIS — E785 Hyperlipidemia, unspecified: Secondary | ICD-10-CM | POA: Diagnosis not present

## 2020-08-30 DIAGNOSIS — Z8616 Personal history of COVID-19: Secondary | ICD-10-CM | POA: Diagnosis not present

## 2020-08-30 DIAGNOSIS — R52 Pain, unspecified: Secondary | ICD-10-CM | POA: Diagnosis not present

## 2020-08-30 DIAGNOSIS — I739 Peripheral vascular disease, unspecified: Secondary | ICD-10-CM | POA: Diagnosis not present

## 2020-08-30 DIAGNOSIS — J441 Chronic obstructive pulmonary disease with (acute) exacerbation: Secondary | ICD-10-CM | POA: Diagnosis not present

## 2020-08-30 DIAGNOSIS — I2582 Chronic total occlusion of coronary artery: Secondary | ICD-10-CM | POA: Diagnosis not present

## 2020-08-30 DIAGNOSIS — I509 Heart failure, unspecified: Secondary | ICD-10-CM | POA: Diagnosis not present

## 2020-08-30 DIAGNOSIS — E539 Vitamin B deficiency, unspecified: Secondary | ICD-10-CM | POA: Diagnosis not present

## 2020-08-30 DIAGNOSIS — K219 Gastro-esophageal reflux disease without esophagitis: Secondary | ICD-10-CM | POA: Diagnosis not present

## 2020-08-30 DIAGNOSIS — R1312 Dysphagia, oropharyngeal phase: Secondary | ICD-10-CM | POA: Diagnosis not present

## 2020-08-31 DIAGNOSIS — J441 Chronic obstructive pulmonary disease with (acute) exacerbation: Secondary | ICD-10-CM | POA: Diagnosis not present

## 2020-08-31 DIAGNOSIS — R52 Pain, unspecified: Secondary | ICD-10-CM | POA: Diagnosis not present

## 2020-08-31 DIAGNOSIS — I509 Heart failure, unspecified: Secondary | ICD-10-CM | POA: Diagnosis not present

## 2020-08-31 DIAGNOSIS — Z8616 Personal history of COVID-19: Secondary | ICD-10-CM | POA: Diagnosis not present

## 2020-08-31 DIAGNOSIS — E785 Hyperlipidemia, unspecified: Secondary | ICD-10-CM | POA: Diagnosis not present

## 2020-08-31 DIAGNOSIS — K219 Gastro-esophageal reflux disease without esophagitis: Secondary | ICD-10-CM | POA: Diagnosis not present

## 2020-08-31 DIAGNOSIS — E539 Vitamin B deficiency, unspecified: Secondary | ICD-10-CM | POA: Diagnosis not present

## 2020-08-31 DIAGNOSIS — I2582 Chronic total occlusion of coronary artery: Secondary | ICD-10-CM | POA: Diagnosis not present

## 2020-08-31 DIAGNOSIS — I739 Peripheral vascular disease, unspecified: Secondary | ICD-10-CM | POA: Diagnosis not present

## 2020-08-31 DIAGNOSIS — R1312 Dysphagia, oropharyngeal phase: Secondary | ICD-10-CM | POA: Diagnosis not present

## 2020-09-01 DIAGNOSIS — I872 Venous insufficiency (chronic) (peripheral): Secondary | ICD-10-CM | POA: Diagnosis not present

## 2020-09-01 DIAGNOSIS — J441 Chronic obstructive pulmonary disease with (acute) exacerbation: Secondary | ICD-10-CM | POA: Diagnosis not present

## 2020-09-01 DIAGNOSIS — R52 Pain, unspecified: Secondary | ICD-10-CM | POA: Diagnosis not present

## 2020-09-01 DIAGNOSIS — Z8616 Personal history of COVID-19: Secondary | ICD-10-CM | POA: Diagnosis not present

## 2020-09-01 DIAGNOSIS — K219 Gastro-esophageal reflux disease without esophagitis: Secondary | ICD-10-CM | POA: Diagnosis not present

## 2020-09-01 DIAGNOSIS — R1312 Dysphagia, oropharyngeal phase: Secondary | ICD-10-CM | POA: Diagnosis not present

## 2020-09-01 DIAGNOSIS — E539 Vitamin B deficiency, unspecified: Secondary | ICD-10-CM | POA: Diagnosis not present

## 2020-09-01 DIAGNOSIS — I509 Heart failure, unspecified: Secondary | ICD-10-CM | POA: Diagnosis not present

## 2020-09-01 DIAGNOSIS — I10 Essential (primary) hypertension: Secondary | ICD-10-CM | POA: Diagnosis not present

## 2020-09-01 DIAGNOSIS — I2582 Chronic total occlusion of coronary artery: Secondary | ICD-10-CM | POA: Diagnosis not present

## 2020-09-01 DIAGNOSIS — I739 Peripheral vascular disease, unspecified: Secondary | ICD-10-CM | POA: Diagnosis not present

## 2020-09-01 DIAGNOSIS — E785 Hyperlipidemia, unspecified: Secondary | ICD-10-CM | POA: Diagnosis not present

## 2020-09-02 DIAGNOSIS — I509 Heart failure, unspecified: Secondary | ICD-10-CM | POA: Diagnosis not present

## 2020-09-02 DIAGNOSIS — R1312 Dysphagia, oropharyngeal phase: Secondary | ICD-10-CM | POA: Diagnosis not present

## 2020-09-02 DIAGNOSIS — E785 Hyperlipidemia, unspecified: Secondary | ICD-10-CM | POA: Diagnosis not present

## 2020-09-02 DIAGNOSIS — Z86718 Personal history of other venous thrombosis and embolism: Secondary | ICD-10-CM | POA: Diagnosis not present

## 2020-09-02 DIAGNOSIS — R52 Pain, unspecified: Secondary | ICD-10-CM | POA: Diagnosis not present

## 2020-09-02 DIAGNOSIS — I2582 Chronic total occlusion of coronary artery: Secondary | ICD-10-CM | POA: Diagnosis not present

## 2020-09-02 DIAGNOSIS — E539 Vitamin B deficiency, unspecified: Secondary | ICD-10-CM | POA: Diagnosis not present

## 2020-09-02 DIAGNOSIS — I872 Venous insufficiency (chronic) (peripheral): Secondary | ICD-10-CM | POA: Diagnosis not present

## 2020-09-02 DIAGNOSIS — K219 Gastro-esophageal reflux disease without esophagitis: Secondary | ICD-10-CM | POA: Diagnosis not present

## 2020-09-02 DIAGNOSIS — J441 Chronic obstructive pulmonary disease with (acute) exacerbation: Secondary | ICD-10-CM | POA: Diagnosis not present

## 2020-09-02 DIAGNOSIS — Z8616 Personal history of COVID-19: Secondary | ICD-10-CM | POA: Diagnosis not present

## 2020-09-02 DIAGNOSIS — I739 Peripheral vascular disease, unspecified: Secondary | ICD-10-CM | POA: Diagnosis not present

## 2020-09-05 DIAGNOSIS — J441 Chronic obstructive pulmonary disease with (acute) exacerbation: Secondary | ICD-10-CM | POA: Diagnosis not present

## 2020-09-05 DIAGNOSIS — I82402 Acute embolism and thrombosis of unspecified deep veins of left lower extremity: Secondary | ICD-10-CM | POA: Diagnosis not present

## 2020-09-05 DIAGNOSIS — I872 Venous insufficiency (chronic) (peripheral): Secondary | ICD-10-CM | POA: Diagnosis not present

## 2020-09-05 DIAGNOSIS — I509 Heart failure, unspecified: Secondary | ICD-10-CM | POA: Diagnosis not present

## 2020-09-06 DIAGNOSIS — R600 Localized edema: Secondary | ICD-10-CM | POA: Diagnosis not present

## 2020-09-06 DIAGNOSIS — I2582 Chronic total occlusion of coronary artery: Secondary | ICD-10-CM | POA: Diagnosis not present

## 2020-09-06 DIAGNOSIS — E539 Vitamin B deficiency, unspecified: Secondary | ICD-10-CM | POA: Diagnosis not present

## 2020-09-06 DIAGNOSIS — L603 Nail dystrophy: Secondary | ICD-10-CM | POA: Diagnosis not present

## 2020-09-06 DIAGNOSIS — Z8616 Personal history of COVID-19: Secondary | ICD-10-CM | POA: Diagnosis not present

## 2020-09-06 DIAGNOSIS — I739 Peripheral vascular disease, unspecified: Secondary | ICD-10-CM | POA: Diagnosis not present

## 2020-09-06 DIAGNOSIS — J441 Chronic obstructive pulmonary disease with (acute) exacerbation: Secondary | ICD-10-CM | POA: Diagnosis not present

## 2020-09-06 DIAGNOSIS — R1312 Dysphagia, oropharyngeal phase: Secondary | ICD-10-CM | POA: Diagnosis not present

## 2020-09-06 DIAGNOSIS — R52 Pain, unspecified: Secondary | ICD-10-CM | POA: Diagnosis not present

## 2020-09-06 DIAGNOSIS — E785 Hyperlipidemia, unspecified: Secondary | ICD-10-CM | POA: Diagnosis not present

## 2020-09-06 DIAGNOSIS — K219 Gastro-esophageal reflux disease without esophagitis: Secondary | ICD-10-CM | POA: Diagnosis not present

## 2020-09-06 DIAGNOSIS — I82409 Acute embolism and thrombosis of unspecified deep veins of unspecified lower extremity: Secondary | ICD-10-CM | POA: Diagnosis not present

## 2020-09-06 DIAGNOSIS — I509 Heart failure, unspecified: Secondary | ICD-10-CM | POA: Diagnosis not present

## 2020-09-06 DIAGNOSIS — R262 Difficulty in walking, not elsewhere classified: Secondary | ICD-10-CM | POA: Diagnosis not present

## 2020-09-07 DIAGNOSIS — I2582 Chronic total occlusion of coronary artery: Secondary | ICD-10-CM | POA: Diagnosis not present

## 2020-09-07 DIAGNOSIS — E785 Hyperlipidemia, unspecified: Secondary | ICD-10-CM | POA: Diagnosis not present

## 2020-09-07 DIAGNOSIS — I509 Heart failure, unspecified: Secondary | ICD-10-CM | POA: Diagnosis not present

## 2020-09-07 DIAGNOSIS — J441 Chronic obstructive pulmonary disease with (acute) exacerbation: Secondary | ICD-10-CM | POA: Diagnosis not present

## 2020-09-07 DIAGNOSIS — K219 Gastro-esophageal reflux disease without esophagitis: Secondary | ICD-10-CM | POA: Diagnosis not present

## 2020-09-07 DIAGNOSIS — Z8616 Personal history of COVID-19: Secondary | ICD-10-CM | POA: Diagnosis not present

## 2020-09-07 DIAGNOSIS — E539 Vitamin B deficiency, unspecified: Secondary | ICD-10-CM | POA: Diagnosis not present

## 2020-09-07 DIAGNOSIS — I739 Peripheral vascular disease, unspecified: Secondary | ICD-10-CM | POA: Diagnosis not present

## 2020-09-07 DIAGNOSIS — R52 Pain, unspecified: Secondary | ICD-10-CM | POA: Diagnosis not present

## 2020-09-07 DIAGNOSIS — R1312 Dysphagia, oropharyngeal phase: Secondary | ICD-10-CM | POA: Diagnosis not present

## 2020-09-08 ENCOUNTER — Encounter (INDEPENDENT_AMBULATORY_CARE_PROVIDER_SITE_OTHER): Payer: Self-pay | Admitting: Vascular Surgery

## 2020-09-16 ENCOUNTER — Encounter (INDEPENDENT_AMBULATORY_CARE_PROVIDER_SITE_OTHER): Payer: Self-pay | Admitting: Vascular Surgery

## 2020-09-20 ENCOUNTER — Ambulatory Visit (INDEPENDENT_AMBULATORY_CARE_PROVIDER_SITE_OTHER): Payer: Medicare Other | Admitting: Vascular Surgery

## 2020-09-20 ENCOUNTER — Encounter (INDEPENDENT_AMBULATORY_CARE_PROVIDER_SITE_OTHER): Payer: Self-pay | Admitting: Vascular Surgery

## 2020-09-20 ENCOUNTER — Other Ambulatory Visit: Payer: Self-pay

## 2020-09-20 VITALS — BP 116/78 | HR 91 | Resp 16

## 2020-09-20 DIAGNOSIS — I724 Aneurysm of artery of lower extremity: Secondary | ICD-10-CM

## 2020-09-20 DIAGNOSIS — I824Y2 Acute embolism and thrombosis of unspecified deep veins of left proximal lower extremity: Secondary | ICD-10-CM | POA: Diagnosis not present

## 2020-09-20 DIAGNOSIS — M7989 Other specified soft tissue disorders: Secondary | ICD-10-CM

## 2020-09-20 DIAGNOSIS — E785 Hyperlipidemia, unspecified: Secondary | ICD-10-CM | POA: Diagnosis not present

## 2020-09-20 NOTE — Progress Notes (Signed)
Patient ID: Anthony Chambers, male   DOB: 1945/01/26, 75 y.o.   MRN: [id number]  Chief Complaint  Patient presents with   New Patient (Initial Visit)    Ref Andrey Campanile acute embolism and thrombosis of deep veins unspecified lower extremity    HPI Anthony Chambers is a 75 y.o. male.  I am asked to see the patient by Consuela Mimes for evaluation of left lower extremity DVT.  I do not have the ultrasound or the ultrasound report so I am not sure when this was diagnosed.  The patient says he has been having left leg swelling for many years.  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  No open wounds or infection.  Mild right leg swelling but more pronounced left leg swelling.  He is in wheelchair and does not walk much.  He is in a facility at this point.  He is a very poor historian.     Past Medical History:  Diagnosis Date   Depression    GERD (gastroesophageal reflux disease)    Hyperlipidemia    Hypertension    Schizophrenia (HCC)    Total occlusion of coronary artery, chronic     Past Surgical History Repair of popliteal aneurysm   Family History  Problem Relation Age of Onset   Asthma Mother   No known bleeding disorders or clotting disorders   Social History   Tobacco Use   Smoking status: Former    Years: 40.00    Types: Cigarettes   Smokeless tobacco: Never  Substance Use Topics   Alcohol use: Not Currently   Drug use: Never     Allergies  Allergen Reactions   Heparin     Current Outpatient Medications  Medication Sig Dispense Refill   acetaminophen (TYLENOL) 325 MG tablet Take 650 mg by mouth every 4 (four) hours as needed for mild pain or moderate pain.     acetaminophen (TYLENOL) 500 MG tablet Take 1,000 mg by mouth at bedtime.     albuterol (VENTOLIN HFA) 108 (90  Base) MCG/ACT inhaler Inhale 2 puffs into the lungs every 4 (four) hours as needed for wheezing or shortness of breath.     ergocalciferol (VITAMIN D2) 1.25 MG (50000 UT) capsule Take 50,000 Units by mouth once a week. Every wednesday     furosemide (LASIX) 40 MG tablet Take 40 mg by mouth daily.     Ipratropium-Albuterol (COMBIVENT RESPIMAT) 20-100 MCG/ACT AERS respimat Inhale 1 puff into the lungs in the morning, at noon, in the evening, and at bedtime.     metoprolol succinate (TOPROL-XL) 25 MG 24 hr tablet Take 1 tablet (25 mg total) by mouth daily. Take with or immediately following a meal.     OLANZapine (ZYPREXA) 15 MG tablet Take 30 mg by mouth daily.     omeprazole (PRILOSEC) 20 MG capsule Take 20 mg by mouth daily.     OXYGEN Inhale 4 L/min into the lungs continuous.     risperiDONE (RISPERDAL) 1 MG tablet Take 1 mg by mouth 2 (two) times daily.      rivaroxaban (XARELTO) 20 MG TABS tablet Take 20 mg by mouth daily.     saccharomyces boulardii (FLORASTOR) 250 MG capsule Take 250 mg by mouth 2 (two) times daily.  sennosides-docusate sodium (SENOKOT-S) 8.6-50 MG tablet Take 1 tablet by mouth in the morning and at bedtime.     simvastatin (ZOCOR) 20 MG tablet Take 20 mg by mouth at bedtime.     tamsulosin (FLOMAX) 0.4 MG CAPS capsule Take 1 capsule (0.4 mg total) by mouth daily after supper. 90 capsule 3   triamcinolone cream (KENALOG) 0.5 % Apply 1 application topically 2 (two) times daily.     No current facility-administered medications for this visit.      REVIEW OF SYSTEMS (Negative unless checked)  Constitutional: [] Weight loss  [] Fever  [] Chills Cardiac: [] Chest pain   [] Chest pressure   [] Palpitations   [] Shortness of breath when laying flat   [] Shortness of breath at rest   [] Shortness of breath with exertion. Vascular:  [] Pain in legs with walking   [] Pain in legs at rest   [] Pain in legs when laying flat   [] Claudication   [] Pain in feet when walking  [] Pain in feet at  rest  [] Pain in feet when laying flat   [] History of DVT   [] Phlebitis   [x] Swelling in legs   [] Varicose veins   [] Non-healing ulcers Pulmonary:   [] Uses home oxygen   [] Productive cough   [] Hemoptysis   [] Wheeze  [] COPD   [] Asthma Neurologic:  [] Dizziness  [] Blackouts   [] Seizures   [] History of stroke   [] History of TIA  [] Aphasia   [] Temporary blindness   [] Dysphagia   [] Weakness or numbness in arms   [] Weakness or numbness in legs Musculoskeletal:  [x] Arthritis   [] Joint swelling   [x] Joint pain   [] Low back pain Hematologic:  [] Easy bruising  [] Easy bleeding   [] Hypercoagulable state   [] Anemic  [] Hepatitis Gastrointestinal:  [] Blood in stool   [] Vomiting blood  [x] Gastroesophageal reflux/heartburn   [] Abdominal pain Genitourinary:  [] Chronic kidney disease   [] Difficult urination  [] Frequent urination  [] Burning with urination   [] Hematuria Skin:  [] Rashes   [] Ulcers   [] Wounds Psychological:  [] History of anxiety   [x]  History of major depression.    Physical Exam BP 116/78 (BP Location: Right Arm)   Pulse 91   Resp 16  Gen:  WD/WN, NAD Head: Corn/AT, No temporalis wasting.  Ear/Nose/Throat: Hearing grossly intact, nares w/o erythema or drainage, oropharynx w/o Erythema/Exudate Eyes: Conjunctiva clear, sclera non-icteric  Neck: trachea midline.  No JVD.  Pulmonary:  Good air movement, respirations not labored, no use of accessory muscles  Cardiac: Irregular Vascular:  Vessel Right Left  Radial Palpable Palpable                          DP 1+ NP  PT NP NP   Gastrointestinal:. No masses, surgical incisions, or scars. Musculoskeletal: M/S 5/5 throughout.  Extremities without ischemic changes.  No deformity or atrophy.  1+ right lower extremity edema, 2-3+ left lower extremity edema.  In a wheelchair. Neurologic: Sensation grossly intact in extremities.  Symmetrical.  Speech is fluent. Motor exam as listed above. Psychiatric: Judgment and insight appear fairly  poor Dermatologic: No rashes or ulcers noted.  No cellulitis or open wounds.    Radiology No results found.  Labs Recent Results (from the past 2160 hour(s))  CBC with Differential     Status: Abnormal   Collection Time: 08/04/20  7:27 PM  Result Value Ref Range   WBC 5.4 4.0 - 10.5 K/uL   RBC 4.27 4.22 - 5.81 MIL/uL   Hemoglobin 12.6 (L) 13.0 -  17.0 g/dL   HCT 16.1 09.6 - 04.5 %   MCV 93.9 80.0 - 100.0 fL   MCH 29.5 26.0 - 34.0 pg   MCHC 31.4 30.0 - 36.0 g/dL   RDW 40.9 81.1 - 91.4 %   Platelets 135 (L) 150 - 400 K/uL    Comment: Immature Platelet Fraction may be clinically indicated, consider ordering this additional test [id number]    nRBC 0.0 0.0 - 0.2 %   Neutrophils Relative % 84 %   Neutro Abs 4.5 1.7 - 7.7 K/uL   Lymphocytes Relative 8 %   Lymphs Abs 0.4 (L) 0.7 - 4.0 K/uL   Monocytes Relative 8 %   Monocytes Absolute 0.5 0.1 - 1.0 K/uL   Eosinophils Relative 0 %   Eosinophils Absolute 0.0 0.0 - 0.5 K/uL   Basophils Relative 0 %   Basophils Absolute 0.0 0.0 - 0.1 K/uL   Immature Granulocytes 0 %   Abs Immature Granulocytes 0.01 0.00 - 0.07 K/uL    Comment: Performed at [facility], [address], [place], [place] [address]  Basic metabolic panel     Status: Abnormal   Collection Time: 08/04/20  7:27 PM  Result Value Ref Range   Sodium 141 135 - 145 mmol/L   Potassium 3.8 3.5 - 5.1 mmol/L   Chloride 96 (L) 98 - 111 mmol/L   CO2 39 (H) 22 - 32 mmol/L   Glucose, Bld 111 (H) 70 - 99 mg/dL    Comment: Glucose reference range applies only to samples taken after fasting for at least 8 hours.   BUN 21 8 - 23 mg/dL   Creatinine, Ser 7.84 0.61 - 1.24 mg/dL   Calcium 8.1 (L) 8.9 - 10.3 mg/dL   GFR, Estimated >69 >62 mL/min    Comment: (NOTE) Calculated using the CKD-EPI Creatinine Equation (2021)    Anion gap 6 5 - 15    Comment: Performed at [facility], [address] [place], [place] [address]  Brain natriuretic peptide     Status:  Abnormal   Collection Time: 08/04/20  7:27 PM  Result Value Ref Range   B Natriuretic Peptide 216.9 (H) 0.0 - 100.0 pg/mL    Comment: Performed at [facility], [address], [place], [place] [address]  Resp Panel by RT-PCR (Flu A&B, Covid) Nasopharyngeal Swab     Status: None   Collection Time: 08/04/20  7:37 PM   Specimen: Nasopharyngeal Swab; Nasopharyngeal(NP) swabs in vial transport medium  Result Value Ref Range   SARS Coronavirus 2 by RT PCR NEGATIVE NEGATIVE    Comment: (NOTE) SARS-CoV-2 target nucleic acids are NOT DETECTED.  The SARS-CoV-2 RNA is generally detectable in upper respiratory specimens during the acute phase of infection. The lowest concentration of SARS-CoV-2 viral copies this assay can detect is 138 copies/mL. A negative result does not preclude SARS-Cov-2 infection and should not be used as the sole basis for treatment or other patient management decisions. A negative result may occur with  improper specimen collection/handling, submission of specimen other than nasopharyngeal swab, presence of viral mutation(s) within the areas targeted by this assay, and inadequate number of viral copies(<138 copies/mL). A negative result must be combined with clinical observations, patient history, and epidemiological information. The expected result is Negative.  Fact Sheet for Patients:  BloggerCourse.com  Fact Sheet for Healthcare Providers:  SeriousBroker.it  This test is no t yet approved or cleared by the [place] and  has been authorized for  detection and/or diagnosis of SARS-CoV-2 by FDA under an Emergency Use Authorization (EUA). This EUA will remain  in effect (meaning this test can be used) for the duration of the COVID-19 declaration under Section 564(b)(1) of the Act, 21 U.S.C.section 360bbb-3(b)(1), unless the authorization is terminated  or revoked sooner.       Influenza A by PCR  NEGATIVE NEGATIVE   Influenza B by PCR NEGATIVE NEGATIVE    Comment: (NOTE) The Xpert Xpress SARS-CoV-2/FLU/RSV plus assay is intended as an aid in the diagnosis of influenza from Nasopharyngeal swab specimens and should not be used as a sole basis for treatment. Nasal washings and aspirates are unacceptable for Xpert Xpress SARS-CoV-2/FLU/RSV testing.  Fact Sheet for Patients: BloggerCourse.com  Fact Sheet for Healthcare Providers: SeriousBroker.it  This test is not yet approved or cleared by the [place] FDA and has been authorized for detection and/or diagnosis of SARS-CoV-2 by FDA under an Emergency Use Authorization (EUA). This EUA will remain in effect (meaning this test can be used) for the duration of the COVID-19 declaration under Section 564(b)(1) of the Act, 21 U.S.C. section 360bbb-3(b)(1), unless the authorization is terminated or revoked.  Performed at [facility], [address] [place], [place] [address]   Comprehensive metabolic panel     Status: Abnormal   Collection Time: 08/07/20  5:27 PM  Result Value Ref Range   Sodium 141 135 - 145 mmol/L    Comment: ELECTROLYTES REPEATED TO VERIFY SKL   Potassium 4.1 3.5 - 5.1 mmol/L   Chloride 99 98 - 111 mmol/L   CO2 41 (H) 22 - 32 mmol/L   Glucose, Bld 127 (H) 70 - 99 mg/dL    Comment: Glucose reference range applies only to samples taken after fasting for at least 8 hours.   BUN 20 8 - 23 mg/dL   Creatinine, Ser 2.95 0.61 - 1.24 mg/dL   Calcium 8.3 (L) 8.9 - 10.3 mg/dL   Total Protein 6.5 6.5 - 8.1 g/dL   Albumin 3.0 (L) 3.5 - 5.0 g/dL   AST 16 15 - 41 U/L   ALT 12 0 - 44 U/L   Alkaline Phosphatase 66 38 - 126 U/L   Total Bilirubin 0.4 0.3 - 1.2 mg/dL   GFR, Estimated >62 >13 mL/min    Comment: (NOTE) Calculated using the CKD-EPI Creatinine Equation (2021)    Anion gap 1 (L) 5 - 15    Comment: Performed at [facility], [address] [place], [place] [address]  CBC with Differential/Platelet     Status: Abnormal   Collection Time: 08/07/20  5:27 PM  Result Value Ref Range   WBC 6.1 4.0 - 10.5 K/uL   RBC 3.98 (L) 4.22 - 5.81 MIL/uL   Hemoglobin 12.0 (L) 13.0 - 17.0 g/dL   HCT 84.6 (L) 96.2 - 95.2 %   MCV 97.0 80.0 - 100.0 fL   MCH 30.2 26.0 - 34.0 pg   MCHC 31.1 30.0 - 36.0 g/dL   RDW 84.1 32.4 - 40.1 %   Platelets 128 (L) 150 - 400 K/uL    Comment: Immature Platelet Fraction may be clinically indicated, consider ordering this additional test [id number]    nRBC 0.0 0.0 - 0.2 %   Neutrophils Relative % 65 %   Neutro Abs 3.9 1.7 - 7.7 K/uL   Lymphocytes Relative 20 %   Lymphs Abs 1.2 0.7 - 4.0 K/uL   Monocytes Relative 12 %   Monocytes Absolute 0.7 0.1 -  1.0 K/uL   Eosinophils Relative 2 %   Eosinophils Absolute 0.1 0.0 - 0.5 K/uL   Basophils Relative 0 %   Basophils Absolute 0.0 0.0 - 0.1 K/uL   Immature Granulocytes 1 %   Abs Immature Granulocytes 0.05 0.00 - 0.07 K/uL    Comment: Performed at [facility], [address], [place], [place] [address]  Blood gas, arterial     Status: Abnormal   Collection Time: 08/07/20  5:27 PM  Result Value Ref Range   FIO2 0.48    pH, Arterial 7.36 7.350 - 7.450   pCO2 arterial 81 (HH) 32.0 - 48.0 mmHg    Comment: CRITICAL RESULT CALLED TO, READ BACK BY AND VERIFIED WITH: Aleene Davidson RN ON [id number] @ 1747 HH    pO2, Arterial 100 83.0 - 108.0 mmHg   Bicarbonate 45.8 (H) 20.0 - 28.0 mmol/L   Acid-Base Excess 16.5 (H) 0.0 - 2.0 mmol/L   O2 Saturation 97.5 %   Patient temperature 37.0    Collection site RIGHT RADIAL    Sample type ARTERIAL DRAW    Allens test (pass/fail) PASS PASS    Comment: Performed at [facility], [address], [place], [place] [address]  Magnesium     Status: None   Collection Time: 08/07/20  5:27 PM  Result Value Ref Range   Magnesium 2.4 1.7 - 2.4 mg/dL    Comment: Performed at [facility], [address] [place], [place] [address]  Troponin I (High Sensitivity)     Status: None   Collection Time: 08/07/20  5:27 PM  Result Value Ref Range   Troponin I (High Sensitivity) 14 <18 ng/L    Comment: (NOTE) Elevated high sensitivity troponin I (hsTnI) values and significant  changes across serial measurements may suggest ACS but many other  chronic and acute conditions are known to elevate hsTnI results.  Refer to the "Links" section for chest pain algorithms and additional  guidance. Performed at [facility], [address] [place], [place] [address]   Resp Panel by RT-PCR (Flu A&B, Covid) Nasopharyngeal Swab     Status: None   Collection Time: 08/07/20  5:27 PM   Specimen: Nasopharyngeal Swab; Nasopharyngeal(NP) swabs in vial transport medium  Result Value Ref Range   SARS Coronavirus 2 by RT PCR NEGATIVE NEGATIVE    Comment: (NOTE) SARS-CoV-2 target nucleic acids are NOT DETECTED.  The SARS-CoV-2 RNA is generally detectable in upper respiratory specimens during the acute phase of infection. The lowest concentration of SARS-CoV-2 viral copies this assay can detect is 138 copies/mL. A negative result does not preclude SARS-Cov-2 infection and should not be used as the sole basis for treatment or other patient management decisions. A negative result may occur with  improper specimen collection/handling, submission of specimen other than nasopharyngeal swab, presence of viral mutation(s) within the areas targeted by this assay, and inadequate number of viral copies(<138 copies/mL). A negative result must be combined with clinical observations, patient history, and epidemiological information. The expected result is Negative.  Fact Sheet for Patients:  BloggerCourse.com  Fact Sheet for Healthcare Providers:  SeriousBroker.it  This test is no t yet approved or cleared by the [place] FDA and  has been authorized for  detection and/or diagnosis of SARS-CoV-2 by FDA under an Emergency Use Authorization (EUA). This EUA will remain  in effect (meaning this test can be used) for the duration of the COVID-19 declaration under Section 564(b)(1) of the Act, 21  U.S.C.section 360bbb-3(b)(1), unless the authorization is terminated  or revoked sooner.       Influenza A by PCR NEGATIVE NEGATIVE   Influenza B by PCR NEGATIVE NEGATIVE    Comment: (NOTE) The Xpert Xpress SARS-CoV-2/FLU/RSV plus assay is intended as an aid in the diagnosis of influenza from Nasopharyngeal swab specimens and should not be used as a sole basis for treatment. Nasal washings and aspirates are unacceptable for Xpert Xpress SARS-CoV-2/FLU/RSV testing.  Fact Sheet for Patients: BloggerCourse.com  Fact Sheet for Healthcare Providers: SeriousBroker.it  This test is not yet approved or cleared by the [place] FDA and has been authorized for detection and/or diagnosis of SARS-CoV-2 by FDA under an Emergency Use Authorization (EUA). This EUA will remain in effect (meaning this test can be used) for the duration of the COVID-19 declaration under Section 564(b)(1) of the Act, 21 U.S.C. section 360bbb-3(b)(1), unless the authorization is terminated or revoked.  Performed at [facility], [address] [place], [place] [address]   Troponin I (High Sensitivity)     Status: None   Collection Time: 08/07/20  5:27 PM  Result Value Ref Range   Troponin I (High Sensitivity) 14 <18 ng/L    Comment: (NOTE) Elevated high sensitivity troponin I (hsTnI) values and significant  changes across serial measurements may suggest ACS but many other  chronic and acute conditions are known to elevate hsTnI results.  Refer to the "Links" section for chest pain algorithms and additional  guidance. Performed at [facility], [address] [place], [place] [address]   Brain  natriuretic peptide     Status: Abnormal   Collection Time: 08/07/20  6:25 PM  Result Value Ref Range   B Natriuretic Peptide 377.7 (H) 0.0 - 100.0 pg/mL    Comment: Performed at [facility], [address] [place], [place] [address]  Procalcitonin - Baseline     Status: None   Collection Time: 08/07/20  6:25 PM  Result Value Ref Range   Procalcitonin <0.10 ng/mL    Comment:        Interpretation: PCT (Procalcitonin) <= 0.5 ng/mL: Systemic infection (sepsis) is not likely. Local bacterial infection is possible. (NOTE)       Sepsis PCT Algorithm           Lower Respiratory Tract                                      Infection PCT Algorithm    ----------------------------     ----------------------------         PCT < 0.25 ng/mL                PCT < 0.10 ng/mL          Strongly encourage             Strongly discourage   discontinuation of antibiotics    initiation of antibiotics    ----------------------------     -----------------------------       PCT 0.25 - 0.50 ng/mL            PCT 0.10 - 0.25 ng/mL               OR       >80% decrease in PCT            Discourage initiation of  antibiotics      Encourage discontinuation           of antibiotics    ----------------------------     -----------------------------         PCT >= 0.50 ng/mL              PCT 0.26 - 0.50 ng/mL               AND        <80% decrease in PCT             Encourage initiation of                                             antibiotics       Encourage continuation           of antibiotics    ----------------------------     -----------------------------        PCT >= 0.50 ng/mL                  PCT > 0.50 ng/mL               AND         increase in PCT                  Strongly encourage                                      initiation of antibiotics    Strongly encourage escalation           of antibiotics                                      -----------------------------                                           PCT <= 0.25 ng/mL                                                 OR                                        > 80% decrease in PCT                                      Discontinue / Do not initiate                                             antibiotics  Performed at [facility], [address], [place], [place] [address]   Procalcitonin     Status: None   Collection Time: 08/08/20  4:03 AM  Result Value Ref Range   Procalcitonin <0.10 ng/mL    Comment:        Interpretation: PCT (Procalcitonin) <= 0.5 ng/mL: Systemic infection (sepsis) is not likely. Local bacterial infection is possible. (NOTE)       Sepsis PCT Algorithm           Lower Respiratory Tract                                      Infection PCT Algorithm    ----------------------------     ----------------------------         PCT < 0.25 ng/mL                PCT < 0.10 ng/mL          Strongly encourage             Strongly discourage   discontinuation of antibiotics    initiation of antibiotics    ----------------------------     -----------------------------       PCT 0.25 - 0.50 ng/mL            PCT 0.10 - 0.25 ng/mL               OR       >80% decrease in PCT            Discourage initiation of                                            antibiotics      Encourage discontinuation           of antibiotics    ----------------------------     -----------------------------         PCT >= 0.50 ng/mL              PCT 0.26 - 0.50 ng/mL               AND        <80% decrease in PCT             Encourage initiation of                                             antibiotics       Encourage continuation           of antibiotics    ----------------------------     -----------------------------        PCT >= 0.50 ng/mL                  PCT > 0.50 ng/mL               AND         increase in PCT                  Strongly encourage                                       initiation of antibiotics    Strongly encourage escalation  of antibiotics                                     -----------------------------                                           PCT <= 0.25 ng/mL                                                 OR                                        > 80% decrease in PCT                                      Discontinue / Do not initiate                                             antibiotics  Performed at [facility], [address] [place], [place] [address]   Basic metabolic panel     Status: Abnormal   Collection Time: 08/08/20  4:03 AM  Result Value Ref Range   Sodium 141 135 - 145 mmol/L   Potassium 4.1 3.5 - 5.1 mmol/L   Chloride 95 (L) 98 - 111 mmol/L   CO2 36 (H) 22 - 32 mmol/L   Glucose, Bld 149 (H) 70 - 99 mg/dL    Comment: Glucose reference range applies only to samples taken after fasting for at least 8 hours.   BUN 22 8 - 23 mg/dL   Creatinine, Ser 3.79 0.61 - 1.24 mg/dL   Calcium 8.0 (L) 8.9 - 10.3 mg/dL   GFR, Estimated >02 >40 mL/min    Comment: (NOTE) Calculated using the CKD-EPI Creatinine Equation (2021)    Anion gap 10 5 - 15    Comment: Performed at [facility], [address] [place], [place] [address]  CBC     Status: Abnormal   Collection Time: 08/08/20  4:03 AM  Result Value Ref Range   WBC 7.9 4.0 - 10.5 K/uL   RBC 4.30 4.22 - 5.81 MIL/uL   Hemoglobin 12.9 (L) 13.0 - 17.0 g/dL   HCT 29.9 24.2 - 68.3 %   MCV 96.3 80.0 - 100.0 fL   MCH 30.0 26.0 - 34.0 pg   MCHC 31.2 30.0 - 36.0 g/dL   RDW 41.9 62.2 - 29.7 %   Platelets 139 (L) 150 - 400 K/uL   nRBC 0.0 0.0 - 0.2 %    Comment: Performed at [facility], [address] [place], [place] [address]  TSH     Status: None   Collection Time: 08/08/20  4:03 AM  Result Value Ref Range   TSH 0.628 0.350 - 4.500 uIU/mL    Comment: Performed by a 3rd Generation assay with a functional sensitivity  of <=0.01  uIU/mL. Performed at [facility], [address] [place], [place] [address]   Magnesium     Status: None   Collection Time: 08/08/20  4:03 AM  Result Value Ref Range   Magnesium 2.4 1.7 - 2.4 mg/dL    Comment: Performed at [facility], [address] [place], [place] [address]  ECHOCARDIOGRAM COMPLETE     Status: None   Collection Time: 08/08/20  9:58 AM  Result Value Ref Range   BP 138/62 mmHg   Ao pk vel 1.59 m/s   AV Mean grad 4.0 mmHg   AV Peak grad 10.1 mmHg   Area-P 1/2 4.39 cm2  Basic metabolic panel     Status: Abnormal   Collection Time: 08/09/20  6:02 AM  Result Value Ref Range   Sodium 139 135 - 145 mmol/L   Potassium 3.4 (L) 3.5 - 5.1 mmol/L   Chloride 93 (L) 98 - 111 mmol/L   CO2 37 (H) 22 - 32 mmol/L   Glucose, Bld 126 (H) 70 - 99 mg/dL    Comment: Glucose reference range applies only to samples taken after fasting for at least 8 hours.   BUN 30 (H) 8 - 23 mg/dL   Creatinine, Ser 0.27 0.61 - 1.24 mg/dL   Calcium 8.2 (L) 8.9 - 10.3 mg/dL   GFR, Estimated >25 >36 mL/min    Comment: (NOTE) Calculated using the CKD-EPI Creatinine Equation (2021)    Anion gap 9 5 - 15    Comment: Performed at [facility], [address] [place], [place] [address]  CBC with Differential/Platelet     Status: Abnormal   Collection Time: 08/09/20  6:02 AM  Result Value Ref Range   WBC 9.0 4.0 - 10.5 K/uL   RBC 4.36 4.22 - 5.81 MIL/uL   Hemoglobin 12.9 (L) 13.0 - 17.0 g/dL   HCT 47.4 25.9 - 56.3 %   MCV 91.3 80.0 - 100.0 fL   MCH 29.6 26.0 - 34.0 pg   MCHC 32.4 30.0 - 36.0 g/dL   RDW 87.5 64.3 - 32.9 %   Platelets 177 150 - 400 K/uL   nRBC 0.0 0.0 - 0.2 %   Neutrophils Relative % 88 %   Neutro Abs 7.9 (H) 1.7 - 7.7 K/uL   Lymphocytes Relative 8 %   Lymphs Abs 0.7 0.7 - 4.0 K/uL   Monocytes Relative 3 %   Monocytes Absolute 0.3 0.1 - 1.0 K/uL   Eosinophils Relative 0 %   Eosinophils Absolute 0.0 0.0 - 0.5 K/uL   Basophils Relative  0 %   Basophils Absolute 0.0 0.0 - 0.1 K/uL   Immature Granulocytes 1 %   Abs Immature Granulocytes 0.07 0.00 - 0.07 K/uL    Comment: Performed at [facility], [address] [place], [place] [address]  Basic metabolic panel     Status: Abnormal   Collection Time: 08/10/20  5:03 AM  Result Value Ref Range   Sodium 137 135 - 145 mmol/L   Potassium 4.6 3.5 - 5.1 mmol/L   Chloride 92 (L) 98 - 111 mmol/L   CO2 37 (H) 22 - 32 mmol/L   Glucose, Bld 133 (H) 70 - 99 mg/dL    Comment: Glucose reference range applies only to samples taken after fasting for at least 8 hours.   BUN 43 (H) 8 - 23 mg/dL   Creatinine, Ser 1.66 (H) 0.61 - 1.24 mg/dL   Calcium 8.6 (L) 8.9 - 10.3 mg/dL  GFR, Estimated 56 (L) >60 mL/min    Comment: (NOTE) Calculated using the CKD-EPI Creatinine Equation (2021)    Anion gap 8 5 - 15    Comment: Performed at [facility], [address] [place], [place] [address]  Magnesium     Status: None   Collection Time: 08/10/20  5:03 AM  Result Value Ref Range   Magnesium 2.3 1.7 - 2.4 mg/dL    Comment: Performed at [facility], [address] [place], [place] [address]  SARS CORONAVIRUS 2 (TAT 6-24 HRS) Nasopharyngeal Nasopharyngeal Swab     Status: None   Collection Time: 08/10/20 10:15 AM   Specimen: Nasopharyngeal Swab  Result Value Ref Range   SARS Coronavirus 2 NEGATIVE NEGATIVE    Comment: (NOTE) SARS-CoV-2 target nucleic acids are NOT DETECTED.  The SARS-CoV-2 RNA is generally detectable in upper and lower respiratory specimens during the acute phase of infection. Negative results do not preclude SARS-CoV-2 infection, do not rule out co-infections with other pathogens, and should not be used as the sole basis for treatment or other patient management decisions. Negative results must be combined with clinical observations, patient history, and epidemiological information. The expected result is Negative.  Fact Sheet for  Patients: HairSlick.no  Fact Sheet for Healthcare Providers: quierodirigir.com  This test is not yet approved or cleared by the [place] FDA and  has been authorized for detection and/or diagnosis of SARS-CoV-2 by FDA under an Emergency Use Authorization (EUA). This EUA will remain  in effect (meaning this test can be used) for the duration of the COVID-19 declaration under Se ction 564(b)(1) of the Act, 21 U.S.C. section 360bbb-3(b)(1), unless the authorization is terminated or revoked sooner.  Performed at [facility], [address], [place], [place] [address]   Basic metabolic panel     Status: Abnormal   Collection Time: 08/11/20  5:16 AM  Result Value Ref Range   Sodium 139 135 - 145 mmol/L   Potassium 4.1 3.5 - 5.1 mmol/L   Chloride 95 (L) 98 - 111 mmol/L   CO2 36 (H) 22 - 32 mmol/L   Glucose, Bld 135 (H) 70 - 99 mg/dL    Comment: Glucose reference range applies only to samples taken after fasting for at least 8 hours.   BUN 47 (H) 8 - 23 mg/dL   Creatinine, Ser 4.85 0.61 - 1.24 mg/dL   Calcium 8.4 (L) 8.9 - 10.3 mg/dL   GFR, Estimated >46 >27 mL/min    Comment: (NOTE) Calculated using the CKD-EPI Creatinine Equation (2021)    Anion gap 8 5 - 15    Comment: Performed at [facility], [address] [place], [place] [address]  Blood gas, arterial     Status: Abnormal   Collection Time: 08/11/20 11:53 AM  Result Value Ref Range   FIO2 0.36    Delivery systems NASAL CANNULA    pH, Arterial 7.48 (H) 7.350 - 7.450   pCO2 arterial 55 (H) 32.0 - 48.0 mmHg   pO2, Arterial 68 (L) 83.0 - 108.0 mmHg   Bicarbonate 41.0 (H) 20.0 - 28.0 mmol/L   Acid-Base Excess 14.7 (H) 0.0 - 2.0 mmol/L   O2 Saturation 94.6 %   Patient temperature 37.0    Collection site RIGHT RADIAL    Sample type ARTERIAL DRAW    Allens test (pass/fail) PASS PASS    Comment: Performed at [facility], [address] [place], [place] [address]  Assessment/Plan:  DVT (deep venous thrombosis) (HCC) The patient comes with a diagnosis of DVT, but without the ultrasound of the report I am not sure of the chronicity or the severity of the DVT.  He is on anticoagulation.  He has had leg swelling for years.  At this point, I would just repeat an ultrasound in 2 to 3 months to assess this and determine if any further therapy is necessary.  He would not benefit from thrombectomy or any surgical or interventional procedure to treat the DVT.  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  This does not appear to have been checked in many years. No limb threatening symptoms.  We can do some arterial studies in the office in the future at his convenience.  Hyperlipidemia lipid control important in reducing the progression of atherosclerotic disease. Continue statin therapy   Swelling of limb This is likely a combination of many factors including postphlebitic syndrome and lymphedema in a patient who is poorly mobile with his legs dependent all the time.      Festus Barren 09/21/2020, 3:48 PM   This note was created with Dragon medical transcription system.  Any errors from dictation are unintentional.

## 2020-09-21 DIAGNOSIS — I724 Aneurysm of artery of lower extremity: Secondary | ICD-10-CM | POA: Insufficient documentation

## 2020-09-21 DIAGNOSIS — M7989 Other specified soft tissue disorders: Secondary | ICD-10-CM | POA: Insufficient documentation

## 2020-09-21 NOTE — Assessment & Plan Note (Signed)
The patient comes with a diagnosis of DVT, but without the ultrasound of the report I am not sure of the chronicity or the severity of the DVT.  He is on anticoagulation.  He has had leg swelling for years.  At this point, I would just repeat an ultrasound in 2 to 3 months to assess this and determine if any further therapy is necessary.  He would not benefit from thrombectomy or any surgical or interventional procedure to treat the DVT.

## 2020-09-21 NOTE — Assessment & Plan Note (Signed)
lipid control important in reducing the progression of atherosclerotic disease. Continue statin therapy  

## 2020-09-21 NOTE — Assessment & Plan Note (Signed)
The patient has previous history of left popliteal aneurysm repair about 8 years ago.  This does not appear to have been checked in many years. No limb threatening symptoms.  We can do some arterial studies in the office in the future at his convenience.

## 2020-09-21 NOTE — Assessment & Plan Note (Signed)
This is likely a combination of many factors including postphlebitic syndrome and lymphedema in a patient who is poorly mobile with his legs dependent all the time.

## 2020-09-27 DIAGNOSIS — I509 Heart failure, unspecified: Secondary | ICD-10-CM | POA: Diagnosis not present

## 2020-09-27 DIAGNOSIS — I872 Venous insufficiency (chronic) (peripheral): Secondary | ICD-10-CM | POA: Diagnosis not present

## 2020-09-27 DIAGNOSIS — D689 Coagulation defect, unspecified: Secondary | ICD-10-CM | POA: Diagnosis not present

## 2020-09-27 DIAGNOSIS — J441 Chronic obstructive pulmonary disease with (acute) exacerbation: Secondary | ICD-10-CM | POA: Diagnosis not present

## 2020-09-27 DIAGNOSIS — I10 Essential (primary) hypertension: Secondary | ICD-10-CM | POA: Diagnosis not present

## 2020-09-27 DIAGNOSIS — I82402 Acute embolism and thrombosis of unspecified deep veins of left lower extremity: Secondary | ICD-10-CM | POA: Diagnosis not present

## 2020-10-03 DIAGNOSIS — J449 Chronic obstructive pulmonary disease, unspecified: Secondary | ICD-10-CM | POA: Diagnosis not present

## 2020-10-11 ENCOUNTER — Non-Acute Institutional Stay: Payer: Medicare Other | Admitting: Primary Care

## 2020-10-11 ENCOUNTER — Other Ambulatory Visit: Payer: Self-pay

## 2020-10-13 DIAGNOSIS — I2510 Atherosclerotic heart disease of native coronary artery without angina pectoris: Secondary | ICD-10-CM | POA: Diagnosis not present

## 2020-10-13 DIAGNOSIS — D689 Coagulation defect, unspecified: Secondary | ICD-10-CM | POA: Diagnosis not present

## 2020-10-13 DIAGNOSIS — I509 Heart failure, unspecified: Secondary | ICD-10-CM | POA: Diagnosis not present

## 2020-10-13 DIAGNOSIS — J441 Chronic obstructive pulmonary disease with (acute) exacerbation: Secondary | ICD-10-CM | POA: Diagnosis not present

## 2020-10-13 DIAGNOSIS — R0902 Hypoxemia: Secondary | ICD-10-CM | POA: Diagnosis not present

## 2020-10-13 DIAGNOSIS — I10 Essential (primary) hypertension: Secondary | ICD-10-CM | POA: Diagnosis not present

## 2020-10-13 DIAGNOSIS — I82402 Acute embolism and thrombosis of unspecified deep veins of left lower extremity: Secondary | ICD-10-CM | POA: Diagnosis not present

## 2020-10-13 DIAGNOSIS — E785 Hyperlipidemia, unspecified: Secondary | ICD-10-CM | POA: Diagnosis not present

## 2020-10-14 ENCOUNTER — Other Ambulatory Visit: Payer: Self-pay

## 2020-10-14 ENCOUNTER — Non-Acute Institutional Stay: Payer: Medicare Other | Admitting: Primary Care

## 2020-10-14 DIAGNOSIS — R21 Rash and other nonspecific skin eruption: Secondary | ICD-10-CM | POA: Insufficient documentation

## 2020-10-14 DIAGNOSIS — Z515 Encounter for palliative care: Secondary | ICD-10-CM

## 2020-10-14 DIAGNOSIS — I739 Peripheral vascular disease, unspecified: Secondary | ICD-10-CM | POA: Diagnosis not present

## 2020-10-14 NOTE — Progress Notes (Signed)
[facility] Consult Note Telephone: [phone]  Fax: [phone]    Date of encounter: 10/14/20 11:55 AM PATIENT NAME: Anthony Chambers [id number]   [phone] (home)  DOB: [birth year] MRN: [id number] PRIMARY CARE PROVIDER:    Jennette Bill., MD,  [place] [address] [phone]  REFERRING PROVIDER:   Jennette Bill., MD [address] [place],  [place] [address] [phone]  RESPONSIBLE PARTY:    Contact Information     Name Relation Home Work Mobile   Eddie Candle Sister [phone] [place] Relative   [place] [phone]     [place] [phone]         I met face to face with patient in [facility]. Palliative Care was asked to follow this patient by consultation request of  Hester, Ronnette Hila., MD to address advance care planning and complex medical decision making. This is a follow up visit.                                   ASSESSMENT AND PLAN / RECOMMENDATIONS:   Advance Care Planning/Goals of Care: Goals include to maximize quality of life and symptom management. Our advance care planning conversation included a discussion about:     Exploration of personal, cultural or spiritual beliefs that might influence medical decisions  Endorses not enjoying his previous passtimes, eg collection of figures CODE STATUS: FULL CODE  Symptom Management/Plan:  Patient in w/c in room, endorses not feeling very well but not specific. Has given away many of his collections, and no longer has his [employer] figures. These had given him great pleasure. He states he uses oxygen and cpap per order.  Bil LE are reddened and pruritic. He endorses scratching. I  sent order for  bil LE to be Rx with triamcinolone cream 0.1% BID x 3 weeks for dermatitis of LE bil.Sent to SNF pharmacy.  Follow up Palliative Care  Visit: Palliative care will continue to follow for complex medical decision making, advance care planning, and clarification of goals. Return 6 weeks or prn.  I spent 25 minutes providing this consultation. More than 50% of the time in this consultation was spent in counseling and care coordination.  PPS: 40%  HOSPICE ELIGIBILITY/DIAGNOSIS: TBD  Chief Complaint: depression, dermatitis  HISTORY OF PRESENT ILLNESS:  Anthony Chambers is a 75 y.o. year old male  with COPD, CHF, depression, LE dermatitis. Endorses itching and scratching of bil LE. Marland Kitchen   History obtained from review of EMR, discussion with primary team, and interview with family, facility staff/caregiver and/or Mr. Meng.  I reviewed available labs, medications, imaging, studies and related documents from the EMR.  Records reviewed and summarized above.   ROS   General: NAD ENMT: denies dysphagia Cardiovascular: denies chest pain, denies DOE Pulmonary:  endorses occ cough, endorses occ  increased SOB Abdomen: endorses good appetite, denies constipation, endorses continence of bowel GU: denies dysuria, endorses continence of urine MSK:  endorses  weakness,  no falls reported Skin: endorses dermatitis bil LE Neurological: denies pain, denies insomnia Psych: Endorses withdrawn  mood, states no joy in his collections and has given them all away Heme/lymph/immuno: denies bruises, abnormal bleeding  Physical Exam: Current and past weights:233 lbs, 25 lb wt loss in 3 months, 9% Constitutional: NAD General:  frail appearing, EYES: anicteric sclera, lids intact, no discharge  ENMT: intact hearing, oral mucous membranes moist Pulmonary: LCTA, diminished sounds no increased work of breathing, no cough, oxygen at 4 L humidified. Abdomen: intake 175-00%, soft and non tender, no ascites GU: deferred MSK: mod sarcopenia, moves all extremities,  non ambulatory Skin: warm and dry,  bil LE dermatitis Neuro:  ++ generalized weakness, ++  cognitive impairment Psych: mildly anxious affect, A and O x 2-3 Hem/lymph/immuno: no widespread bruising   Thank you for the opportunity to participate in the care of Mr. Pasternak.  The palliative care team will continue to follow. Please call our office at [phone] if we can be of additional assistance.   Jason Coop, NP DNP, AGPCNP-BC  COVID-19 PATIENT SCREENING TOOL Asked and negative response unless otherwise noted:   Have you had symptoms of covid, tested positive or been in contact with someone with symptoms/positive test in the past 5-10 days?

## 2020-10-18 DIAGNOSIS — E538 Deficiency of other specified B group vitamins: Secondary | ICD-10-CM | POA: Diagnosis not present

## 2020-10-18 DIAGNOSIS — Z79899 Other long term (current) drug therapy: Secondary | ICD-10-CM | POA: Diagnosis not present

## 2020-10-18 DIAGNOSIS — E559 Vitamin D deficiency, unspecified: Secondary | ICD-10-CM | POA: Diagnosis not present

## 2020-10-18 DIAGNOSIS — J449 Chronic obstructive pulmonary disease, unspecified: Secondary | ICD-10-CM | POA: Diagnosis not present

## 2020-10-20 DIAGNOSIS — I509 Heart failure, unspecified: Secondary | ICD-10-CM | POA: Diagnosis not present

## 2020-10-20 DIAGNOSIS — I82402 Acute embolism and thrombosis of unspecified deep veins of left lower extremity: Secondary | ICD-10-CM | POA: Diagnosis not present

## 2020-10-20 DIAGNOSIS — I10 Essential (primary) hypertension: Secondary | ICD-10-CM | POA: Diagnosis not present

## 2020-10-20 DIAGNOSIS — J441 Chronic obstructive pulmonary disease with (acute) exacerbation: Secondary | ICD-10-CM | POA: Diagnosis not present

## 2020-10-25 DIAGNOSIS — Z23 Encounter for immunization: Secondary | ICD-10-CM | POA: Diagnosis not present

## 2020-11-10 DIAGNOSIS — E785 Hyperlipidemia, unspecified: Secondary | ICD-10-CM | POA: Diagnosis not present

## 2020-11-10 DIAGNOSIS — I10 Essential (primary) hypertension: Secondary | ICD-10-CM | POA: Diagnosis not present

## 2020-11-10 DIAGNOSIS — I509 Heart failure, unspecified: Secondary | ICD-10-CM | POA: Diagnosis not present

## 2020-11-10 DIAGNOSIS — D689 Coagulation defect, unspecified: Secondary | ICD-10-CM | POA: Diagnosis not present

## 2020-11-10 DIAGNOSIS — R0902 Hypoxemia: Secondary | ICD-10-CM | POA: Diagnosis not present

## 2020-11-10 DIAGNOSIS — I82402 Acute embolism and thrombosis of unspecified deep veins of left lower extremity: Secondary | ICD-10-CM | POA: Diagnosis not present

## 2020-11-10 DIAGNOSIS — J441 Chronic obstructive pulmonary disease with (acute) exacerbation: Secondary | ICD-10-CM | POA: Diagnosis not present

## 2020-11-10 DIAGNOSIS — I2510 Atherosclerotic heart disease of native coronary artery without angina pectoris: Secondary | ICD-10-CM | POA: Diagnosis not present

## 2020-11-11 DIAGNOSIS — I739 Peripheral vascular disease, unspecified: Secondary | ICD-10-CM | POA: Diagnosis not present

## 2020-11-11 DIAGNOSIS — I10 Essential (primary) hypertension: Secondary | ICD-10-CM | POA: Diagnosis not present

## 2020-11-11 DIAGNOSIS — J441 Chronic obstructive pulmonary disease with (acute) exacerbation: Secondary | ICD-10-CM | POA: Diagnosis not present

## 2020-11-11 DIAGNOSIS — E539 Vitamin B deficiency, unspecified: Secondary | ICD-10-CM | POA: Diagnosis not present

## 2020-11-11 DIAGNOSIS — K219 Gastro-esophageal reflux disease without esophagitis: Secondary | ICD-10-CM | POA: Diagnosis not present

## 2020-11-11 DIAGNOSIS — I2582 Chronic total occlusion of coronary artery: Secondary | ICD-10-CM | POA: Diagnosis not present

## 2020-11-11 DIAGNOSIS — Z736 Limitation of activities due to disability: Secondary | ICD-10-CM | POA: Diagnosis not present

## 2020-11-11 DIAGNOSIS — E785 Hyperlipidemia, unspecified: Secondary | ICD-10-CM | POA: Diagnosis not present

## 2020-11-11 DIAGNOSIS — R52 Pain, unspecified: Secondary | ICD-10-CM | POA: Diagnosis not present

## 2020-11-11 DIAGNOSIS — M6281 Muscle weakness (generalized): Secondary | ICD-10-CM | POA: Diagnosis not present

## 2020-11-11 DIAGNOSIS — I5022 Chronic systolic (congestive) heart failure: Secondary | ICD-10-CM | POA: Diagnosis not present

## 2020-11-14 DIAGNOSIS — E539 Vitamin B deficiency, unspecified: Secondary | ICD-10-CM | POA: Diagnosis not present

## 2020-11-14 DIAGNOSIS — M6281 Muscle weakness (generalized): Secondary | ICD-10-CM | POA: Diagnosis not present

## 2020-11-14 DIAGNOSIS — J441 Chronic obstructive pulmonary disease with (acute) exacerbation: Secondary | ICD-10-CM | POA: Diagnosis not present

## 2020-11-14 DIAGNOSIS — I5022 Chronic systolic (congestive) heart failure: Secondary | ICD-10-CM | POA: Diagnosis not present

## 2020-11-14 DIAGNOSIS — R52 Pain, unspecified: Secondary | ICD-10-CM | POA: Diagnosis not present

## 2020-11-14 DIAGNOSIS — Z736 Limitation of activities due to disability: Secondary | ICD-10-CM | POA: Diagnosis not present

## 2020-11-14 DIAGNOSIS — I10 Essential (primary) hypertension: Secondary | ICD-10-CM | POA: Diagnosis not present

## 2020-11-14 DIAGNOSIS — E785 Hyperlipidemia, unspecified: Secondary | ICD-10-CM | POA: Diagnosis not present

## 2020-11-14 DIAGNOSIS — K219 Gastro-esophageal reflux disease without esophagitis: Secondary | ICD-10-CM | POA: Diagnosis not present

## 2020-11-14 DIAGNOSIS — I739 Peripheral vascular disease, unspecified: Secondary | ICD-10-CM | POA: Diagnosis not present

## 2020-11-14 DIAGNOSIS — I2582 Chronic total occlusion of coronary artery: Secondary | ICD-10-CM | POA: Diagnosis not present

## 2020-11-15 DIAGNOSIS — J441 Chronic obstructive pulmonary disease with (acute) exacerbation: Secondary | ICD-10-CM | POA: Diagnosis not present

## 2020-11-15 DIAGNOSIS — K219 Gastro-esophageal reflux disease without esophagitis: Secondary | ICD-10-CM | POA: Diagnosis not present

## 2020-11-15 DIAGNOSIS — E785 Hyperlipidemia, unspecified: Secondary | ICD-10-CM | POA: Diagnosis not present

## 2020-11-15 DIAGNOSIS — I739 Peripheral vascular disease, unspecified: Secondary | ICD-10-CM | POA: Diagnosis not present

## 2020-11-15 DIAGNOSIS — E539 Vitamin B deficiency, unspecified: Secondary | ICD-10-CM | POA: Diagnosis not present

## 2020-11-15 DIAGNOSIS — I10 Essential (primary) hypertension: Secondary | ICD-10-CM | POA: Diagnosis not present

## 2020-11-15 DIAGNOSIS — Z736 Limitation of activities due to disability: Secondary | ICD-10-CM | POA: Diagnosis not present

## 2020-11-15 DIAGNOSIS — I5022 Chronic systolic (congestive) heart failure: Secondary | ICD-10-CM | POA: Diagnosis not present

## 2020-11-15 DIAGNOSIS — M6281 Muscle weakness (generalized): Secondary | ICD-10-CM | POA: Diagnosis not present

## 2020-11-15 DIAGNOSIS — R52 Pain, unspecified: Secondary | ICD-10-CM | POA: Diagnosis not present

## 2020-11-15 DIAGNOSIS — I2582 Chronic total occlusion of coronary artery: Secondary | ICD-10-CM | POA: Diagnosis not present

## 2020-11-16 DIAGNOSIS — K219 Gastro-esophageal reflux disease without esophagitis: Secondary | ICD-10-CM | POA: Diagnosis not present

## 2020-11-16 DIAGNOSIS — I739 Peripheral vascular disease, unspecified: Secondary | ICD-10-CM | POA: Diagnosis not present

## 2020-11-16 DIAGNOSIS — J441 Chronic obstructive pulmonary disease with (acute) exacerbation: Secondary | ICD-10-CM | POA: Diagnosis not present

## 2020-11-16 DIAGNOSIS — E539 Vitamin B deficiency, unspecified: Secondary | ICD-10-CM | POA: Diagnosis not present

## 2020-11-16 DIAGNOSIS — R52 Pain, unspecified: Secondary | ICD-10-CM | POA: Diagnosis not present

## 2020-11-16 DIAGNOSIS — E785 Hyperlipidemia, unspecified: Secondary | ICD-10-CM | POA: Diagnosis not present

## 2020-11-16 DIAGNOSIS — I5022 Chronic systolic (congestive) heart failure: Secondary | ICD-10-CM | POA: Diagnosis not present

## 2020-11-16 DIAGNOSIS — Z736 Limitation of activities due to disability: Secondary | ICD-10-CM | POA: Diagnosis not present

## 2020-11-16 DIAGNOSIS — I10 Essential (primary) hypertension: Secondary | ICD-10-CM | POA: Diagnosis not present

## 2020-11-16 DIAGNOSIS — M6281 Muscle weakness (generalized): Secondary | ICD-10-CM | POA: Diagnosis not present

## 2020-11-16 DIAGNOSIS — I2582 Chronic total occlusion of coronary artery: Secondary | ICD-10-CM | POA: Diagnosis not present

## 2020-11-17 DIAGNOSIS — K219 Gastro-esophageal reflux disease without esophagitis: Secondary | ICD-10-CM | POA: Diagnosis not present

## 2020-11-17 DIAGNOSIS — E785 Hyperlipidemia, unspecified: Secondary | ICD-10-CM | POA: Diagnosis not present

## 2020-11-17 DIAGNOSIS — E539 Vitamin B deficiency, unspecified: Secondary | ICD-10-CM | POA: Diagnosis not present

## 2020-11-17 DIAGNOSIS — I10 Essential (primary) hypertension: Secondary | ICD-10-CM | POA: Diagnosis not present

## 2020-11-17 DIAGNOSIS — Z736 Limitation of activities due to disability: Secondary | ICD-10-CM | POA: Diagnosis not present

## 2020-11-17 DIAGNOSIS — I2582 Chronic total occlusion of coronary artery: Secondary | ICD-10-CM | POA: Diagnosis not present

## 2020-11-17 DIAGNOSIS — M6281 Muscle weakness (generalized): Secondary | ICD-10-CM | POA: Diagnosis not present

## 2020-11-17 DIAGNOSIS — I739 Peripheral vascular disease, unspecified: Secondary | ICD-10-CM | POA: Diagnosis not present

## 2020-11-17 DIAGNOSIS — R52 Pain, unspecified: Secondary | ICD-10-CM | POA: Diagnosis not present

## 2020-11-17 DIAGNOSIS — I5022 Chronic systolic (congestive) heart failure: Secondary | ICD-10-CM | POA: Diagnosis not present

## 2020-11-17 DIAGNOSIS — J441 Chronic obstructive pulmonary disease with (acute) exacerbation: Secondary | ICD-10-CM | POA: Diagnosis not present

## 2020-11-18 DIAGNOSIS — E539 Vitamin B deficiency, unspecified: Secondary | ICD-10-CM | POA: Diagnosis not present

## 2020-11-18 DIAGNOSIS — J441 Chronic obstructive pulmonary disease with (acute) exacerbation: Secondary | ICD-10-CM | POA: Diagnosis not present

## 2020-11-18 DIAGNOSIS — M6281 Muscle weakness (generalized): Secondary | ICD-10-CM | POA: Diagnosis not present

## 2020-11-18 DIAGNOSIS — Z736 Limitation of activities due to disability: Secondary | ICD-10-CM | POA: Diagnosis not present

## 2020-11-18 DIAGNOSIS — I10 Essential (primary) hypertension: Secondary | ICD-10-CM | POA: Diagnosis not present

## 2020-11-18 DIAGNOSIS — I5022 Chronic systolic (congestive) heart failure: Secondary | ICD-10-CM | POA: Diagnosis not present

## 2020-11-18 DIAGNOSIS — K219 Gastro-esophageal reflux disease without esophagitis: Secondary | ICD-10-CM | POA: Diagnosis not present

## 2020-11-18 DIAGNOSIS — I739 Peripheral vascular disease, unspecified: Secondary | ICD-10-CM | POA: Diagnosis not present

## 2020-11-18 DIAGNOSIS — R52 Pain, unspecified: Secondary | ICD-10-CM | POA: Diagnosis not present

## 2020-11-18 DIAGNOSIS — E785 Hyperlipidemia, unspecified: Secondary | ICD-10-CM | POA: Diagnosis not present

## 2020-11-18 DIAGNOSIS — I2582 Chronic total occlusion of coronary artery: Secondary | ICD-10-CM | POA: Diagnosis not present

## 2020-11-21 DIAGNOSIS — K219 Gastro-esophageal reflux disease without esophagitis: Secondary | ICD-10-CM | POA: Diagnosis not present

## 2020-11-21 DIAGNOSIS — I5022 Chronic systolic (congestive) heart failure: Secondary | ICD-10-CM | POA: Diagnosis not present

## 2020-11-21 DIAGNOSIS — I10 Essential (primary) hypertension: Secondary | ICD-10-CM | POA: Diagnosis not present

## 2020-11-21 DIAGNOSIS — Z736 Limitation of activities due to disability: Secondary | ICD-10-CM | POA: Diagnosis not present

## 2020-11-21 DIAGNOSIS — E785 Hyperlipidemia, unspecified: Secondary | ICD-10-CM | POA: Diagnosis not present

## 2020-11-21 DIAGNOSIS — I2582 Chronic total occlusion of coronary artery: Secondary | ICD-10-CM | POA: Diagnosis not present

## 2020-11-21 DIAGNOSIS — R52 Pain, unspecified: Secondary | ICD-10-CM | POA: Diagnosis not present

## 2020-11-21 DIAGNOSIS — M6281 Muscle weakness (generalized): Secondary | ICD-10-CM | POA: Diagnosis not present

## 2020-11-21 DIAGNOSIS — J441 Chronic obstructive pulmonary disease with (acute) exacerbation: Secondary | ICD-10-CM | POA: Diagnosis not present

## 2020-11-21 DIAGNOSIS — E539 Vitamin B deficiency, unspecified: Secondary | ICD-10-CM | POA: Diagnosis not present

## 2020-11-21 DIAGNOSIS — I739 Peripheral vascular disease, unspecified: Secondary | ICD-10-CM | POA: Diagnosis not present

## 2020-11-22 DIAGNOSIS — J441 Chronic obstructive pulmonary disease with (acute) exacerbation: Secondary | ICD-10-CM | POA: Diagnosis not present

## 2020-11-22 DIAGNOSIS — E539 Vitamin B deficiency, unspecified: Secondary | ICD-10-CM | POA: Diagnosis not present

## 2020-11-22 DIAGNOSIS — I2582 Chronic total occlusion of coronary artery: Secondary | ICD-10-CM | POA: Diagnosis not present

## 2020-11-22 DIAGNOSIS — I739 Peripheral vascular disease, unspecified: Secondary | ICD-10-CM | POA: Diagnosis not present

## 2020-11-22 DIAGNOSIS — M6281 Muscle weakness (generalized): Secondary | ICD-10-CM | POA: Diagnosis not present

## 2020-11-22 DIAGNOSIS — I5022 Chronic systolic (congestive) heart failure: Secondary | ICD-10-CM | POA: Diagnosis not present

## 2020-11-22 DIAGNOSIS — K219 Gastro-esophageal reflux disease without esophagitis: Secondary | ICD-10-CM | POA: Diagnosis not present

## 2020-11-22 DIAGNOSIS — R52 Pain, unspecified: Secondary | ICD-10-CM | POA: Diagnosis not present

## 2020-11-22 DIAGNOSIS — I10 Essential (primary) hypertension: Secondary | ICD-10-CM | POA: Diagnosis not present

## 2020-11-22 DIAGNOSIS — E785 Hyperlipidemia, unspecified: Secondary | ICD-10-CM | POA: Diagnosis not present

## 2020-11-22 DIAGNOSIS — Z736 Limitation of activities due to disability: Secondary | ICD-10-CM | POA: Diagnosis not present

## 2020-11-23 DIAGNOSIS — R52 Pain, unspecified: Secondary | ICD-10-CM | POA: Diagnosis not present

## 2020-11-23 DIAGNOSIS — I739 Peripheral vascular disease, unspecified: Secondary | ICD-10-CM | POA: Diagnosis not present

## 2020-11-23 DIAGNOSIS — E539 Vitamin B deficiency, unspecified: Secondary | ICD-10-CM | POA: Diagnosis not present

## 2020-11-23 DIAGNOSIS — I5022 Chronic systolic (congestive) heart failure: Secondary | ICD-10-CM | POA: Diagnosis not present

## 2020-11-23 DIAGNOSIS — I2582 Chronic total occlusion of coronary artery: Secondary | ICD-10-CM | POA: Diagnosis not present

## 2020-11-23 DIAGNOSIS — E785 Hyperlipidemia, unspecified: Secondary | ICD-10-CM | POA: Diagnosis not present

## 2020-11-23 DIAGNOSIS — K219 Gastro-esophageal reflux disease without esophagitis: Secondary | ICD-10-CM | POA: Diagnosis not present

## 2020-11-23 DIAGNOSIS — J441 Chronic obstructive pulmonary disease with (acute) exacerbation: Secondary | ICD-10-CM | POA: Diagnosis not present

## 2020-11-23 DIAGNOSIS — Z736 Limitation of activities due to disability: Secondary | ICD-10-CM | POA: Diagnosis not present

## 2020-11-23 DIAGNOSIS — M6281 Muscle weakness (generalized): Secondary | ICD-10-CM | POA: Diagnosis not present

## 2020-11-23 DIAGNOSIS — I10 Essential (primary) hypertension: Secondary | ICD-10-CM | POA: Diagnosis not present

## 2020-11-24 DIAGNOSIS — J441 Chronic obstructive pulmonary disease with (acute) exacerbation: Secondary | ICD-10-CM | POA: Diagnosis not present

## 2020-11-24 DIAGNOSIS — I2582 Chronic total occlusion of coronary artery: Secondary | ICD-10-CM | POA: Diagnosis not present

## 2020-11-24 DIAGNOSIS — I10 Essential (primary) hypertension: Secondary | ICD-10-CM | POA: Diagnosis not present

## 2020-11-24 DIAGNOSIS — Z736 Limitation of activities due to disability: Secondary | ICD-10-CM | POA: Diagnosis not present

## 2020-11-24 DIAGNOSIS — M6281 Muscle weakness (generalized): Secondary | ICD-10-CM | POA: Diagnosis not present

## 2020-11-24 DIAGNOSIS — K219 Gastro-esophageal reflux disease without esophagitis: Secondary | ICD-10-CM | POA: Diagnosis not present

## 2020-11-24 DIAGNOSIS — I5022 Chronic systolic (congestive) heart failure: Secondary | ICD-10-CM | POA: Diagnosis not present

## 2020-11-24 DIAGNOSIS — E539 Vitamin B deficiency, unspecified: Secondary | ICD-10-CM | POA: Diagnosis not present

## 2020-11-24 DIAGNOSIS — I739 Peripheral vascular disease, unspecified: Secondary | ICD-10-CM | POA: Diagnosis not present

## 2020-11-24 DIAGNOSIS — E785 Hyperlipidemia, unspecified: Secondary | ICD-10-CM | POA: Diagnosis not present

## 2020-11-24 DIAGNOSIS — R52 Pain, unspecified: Secondary | ICD-10-CM | POA: Diagnosis not present

## 2020-11-25 DIAGNOSIS — Z736 Limitation of activities due to disability: Secondary | ICD-10-CM | POA: Diagnosis not present

## 2020-11-25 DIAGNOSIS — E539 Vitamin B deficiency, unspecified: Secondary | ICD-10-CM | POA: Diagnosis not present

## 2020-11-25 DIAGNOSIS — I2582 Chronic total occlusion of coronary artery: Secondary | ICD-10-CM | POA: Diagnosis not present

## 2020-11-25 DIAGNOSIS — I739 Peripheral vascular disease, unspecified: Secondary | ICD-10-CM | POA: Diagnosis not present

## 2020-11-25 DIAGNOSIS — E785 Hyperlipidemia, unspecified: Secondary | ICD-10-CM | POA: Diagnosis not present

## 2020-11-25 DIAGNOSIS — J441 Chronic obstructive pulmonary disease with (acute) exacerbation: Secondary | ICD-10-CM | POA: Diagnosis not present

## 2020-11-25 DIAGNOSIS — I10 Essential (primary) hypertension: Secondary | ICD-10-CM | POA: Diagnosis not present

## 2020-11-25 DIAGNOSIS — I5022 Chronic systolic (congestive) heart failure: Secondary | ICD-10-CM | POA: Diagnosis not present

## 2020-11-25 DIAGNOSIS — R52 Pain, unspecified: Secondary | ICD-10-CM | POA: Diagnosis not present

## 2020-11-25 DIAGNOSIS — M6281 Muscle weakness (generalized): Secondary | ICD-10-CM | POA: Diagnosis not present

## 2020-11-25 DIAGNOSIS — K219 Gastro-esophageal reflux disease without esophagitis: Secondary | ICD-10-CM | POA: Diagnosis not present

## 2020-11-27 DIAGNOSIS — Z736 Limitation of activities due to disability: Secondary | ICD-10-CM | POA: Diagnosis not present

## 2020-11-27 DIAGNOSIS — R52 Pain, unspecified: Secondary | ICD-10-CM | POA: Diagnosis not present

## 2020-11-27 DIAGNOSIS — K219 Gastro-esophageal reflux disease without esophagitis: Secondary | ICD-10-CM | POA: Diagnosis not present

## 2020-11-27 DIAGNOSIS — I2582 Chronic total occlusion of coronary artery: Secondary | ICD-10-CM | POA: Diagnosis not present

## 2020-11-27 DIAGNOSIS — E785 Hyperlipidemia, unspecified: Secondary | ICD-10-CM | POA: Diagnosis not present

## 2020-11-27 DIAGNOSIS — I739 Peripheral vascular disease, unspecified: Secondary | ICD-10-CM | POA: Diagnosis not present

## 2020-11-27 DIAGNOSIS — I10 Essential (primary) hypertension: Secondary | ICD-10-CM | POA: Diagnosis not present

## 2020-11-27 DIAGNOSIS — I5022 Chronic systolic (congestive) heart failure: Secondary | ICD-10-CM | POA: Diagnosis not present

## 2020-11-27 DIAGNOSIS — M6281 Muscle weakness (generalized): Secondary | ICD-10-CM | POA: Diagnosis not present

## 2020-11-27 DIAGNOSIS — E539 Vitamin B deficiency, unspecified: Secondary | ICD-10-CM | POA: Diagnosis not present

## 2020-11-27 DIAGNOSIS — J441 Chronic obstructive pulmonary disease with (acute) exacerbation: Secondary | ICD-10-CM | POA: Diagnosis not present

## 2020-11-28 DIAGNOSIS — R52 Pain, unspecified: Secondary | ICD-10-CM | POA: Diagnosis not present

## 2020-11-28 DIAGNOSIS — I2582 Chronic total occlusion of coronary artery: Secondary | ICD-10-CM | POA: Diagnosis not present

## 2020-11-28 DIAGNOSIS — Z736 Limitation of activities due to disability: Secondary | ICD-10-CM | POA: Diagnosis not present

## 2020-11-28 DIAGNOSIS — I10 Essential (primary) hypertension: Secondary | ICD-10-CM | POA: Diagnosis not present

## 2020-11-28 DIAGNOSIS — I739 Peripheral vascular disease, unspecified: Secondary | ICD-10-CM | POA: Diagnosis not present

## 2020-11-28 DIAGNOSIS — J441 Chronic obstructive pulmonary disease with (acute) exacerbation: Secondary | ICD-10-CM | POA: Diagnosis not present

## 2020-11-28 DIAGNOSIS — M6281 Muscle weakness (generalized): Secondary | ICD-10-CM | POA: Diagnosis not present

## 2020-11-28 DIAGNOSIS — I5022 Chronic systolic (congestive) heart failure: Secondary | ICD-10-CM | POA: Diagnosis not present

## 2020-11-28 DIAGNOSIS — K219 Gastro-esophageal reflux disease without esophagitis: Secondary | ICD-10-CM | POA: Diagnosis not present

## 2020-11-28 DIAGNOSIS — E539 Vitamin B deficiency, unspecified: Secondary | ICD-10-CM | POA: Diagnosis not present

## 2020-11-28 DIAGNOSIS — E785 Hyperlipidemia, unspecified: Secondary | ICD-10-CM | POA: Diagnosis not present

## 2020-11-29 DIAGNOSIS — J441 Chronic obstructive pulmonary disease with (acute) exacerbation: Secondary | ICD-10-CM | POA: Diagnosis not present

## 2020-11-29 DIAGNOSIS — I10 Essential (primary) hypertension: Secondary | ICD-10-CM | POA: Diagnosis not present

## 2020-11-29 DIAGNOSIS — K219 Gastro-esophageal reflux disease without esophagitis: Secondary | ICD-10-CM | POA: Diagnosis not present

## 2020-11-29 DIAGNOSIS — E785 Hyperlipidemia, unspecified: Secondary | ICD-10-CM | POA: Diagnosis not present

## 2020-11-29 DIAGNOSIS — M6281 Muscle weakness (generalized): Secondary | ICD-10-CM | POA: Diagnosis not present

## 2020-11-29 DIAGNOSIS — Z736 Limitation of activities due to disability: Secondary | ICD-10-CM | POA: Diagnosis not present

## 2020-11-29 DIAGNOSIS — R52 Pain, unspecified: Secondary | ICD-10-CM | POA: Diagnosis not present

## 2020-11-29 DIAGNOSIS — I739 Peripheral vascular disease, unspecified: Secondary | ICD-10-CM | POA: Diagnosis not present

## 2020-11-29 DIAGNOSIS — I2582 Chronic total occlusion of coronary artery: Secondary | ICD-10-CM | POA: Diagnosis not present

## 2020-11-29 DIAGNOSIS — E539 Vitamin B deficiency, unspecified: Secondary | ICD-10-CM | POA: Diagnosis not present

## 2020-11-29 DIAGNOSIS — I5022 Chronic systolic (congestive) heart failure: Secondary | ICD-10-CM | POA: Diagnosis not present

## 2020-11-30 DIAGNOSIS — K219 Gastro-esophageal reflux disease without esophagitis: Secondary | ICD-10-CM | POA: Diagnosis not present

## 2020-11-30 DIAGNOSIS — Z736 Limitation of activities due to disability: Secondary | ICD-10-CM | POA: Diagnosis not present

## 2020-11-30 DIAGNOSIS — E785 Hyperlipidemia, unspecified: Secondary | ICD-10-CM | POA: Diagnosis not present

## 2020-11-30 DIAGNOSIS — M6281 Muscle weakness (generalized): Secondary | ICD-10-CM | POA: Diagnosis not present

## 2020-11-30 DIAGNOSIS — R52 Pain, unspecified: Secondary | ICD-10-CM | POA: Diagnosis not present

## 2020-11-30 DIAGNOSIS — J441 Chronic obstructive pulmonary disease with (acute) exacerbation: Secondary | ICD-10-CM | POA: Diagnosis not present

## 2020-11-30 DIAGNOSIS — I2582 Chronic total occlusion of coronary artery: Secondary | ICD-10-CM | POA: Diagnosis not present

## 2020-11-30 DIAGNOSIS — E539 Vitamin B deficiency, unspecified: Secondary | ICD-10-CM | POA: Diagnosis not present

## 2020-11-30 DIAGNOSIS — I10 Essential (primary) hypertension: Secondary | ICD-10-CM | POA: Diagnosis not present

## 2020-11-30 DIAGNOSIS — I5022 Chronic systolic (congestive) heart failure: Secondary | ICD-10-CM | POA: Diagnosis not present

## 2020-11-30 DIAGNOSIS — I739 Peripheral vascular disease, unspecified: Secondary | ICD-10-CM | POA: Diagnosis not present

## 2020-12-02 DIAGNOSIS — Z736 Limitation of activities due to disability: Secondary | ICD-10-CM | POA: Diagnosis not present

## 2020-12-02 DIAGNOSIS — E539 Vitamin B deficiency, unspecified: Secondary | ICD-10-CM | POA: Diagnosis not present

## 2020-12-02 DIAGNOSIS — I739 Peripheral vascular disease, unspecified: Secondary | ICD-10-CM | POA: Diagnosis not present

## 2020-12-02 DIAGNOSIS — I2582 Chronic total occlusion of coronary artery: Secondary | ICD-10-CM | POA: Diagnosis not present

## 2020-12-02 DIAGNOSIS — E785 Hyperlipidemia, unspecified: Secondary | ICD-10-CM | POA: Diagnosis not present

## 2020-12-02 DIAGNOSIS — R52 Pain, unspecified: Secondary | ICD-10-CM | POA: Diagnosis not present

## 2020-12-02 DIAGNOSIS — M6281 Muscle weakness (generalized): Secondary | ICD-10-CM | POA: Diagnosis not present

## 2020-12-02 DIAGNOSIS — K219 Gastro-esophageal reflux disease without esophagitis: Secondary | ICD-10-CM | POA: Diagnosis not present

## 2020-12-02 DIAGNOSIS — I10 Essential (primary) hypertension: Secondary | ICD-10-CM | POA: Diagnosis not present

## 2020-12-02 DIAGNOSIS — I5022 Chronic systolic (congestive) heart failure: Secondary | ICD-10-CM | POA: Diagnosis not present

## 2020-12-02 DIAGNOSIS — J441 Chronic obstructive pulmonary disease with (acute) exacerbation: Secondary | ICD-10-CM | POA: Diagnosis not present

## 2020-12-06 ENCOUNTER — Other Ambulatory Visit: Payer: Self-pay

## 2020-12-06 ENCOUNTER — Ambulatory Visit (INDEPENDENT_AMBULATORY_CARE_PROVIDER_SITE_OTHER): Payer: Medicare Other

## 2020-12-06 ENCOUNTER — Ambulatory Visit (INDEPENDENT_AMBULATORY_CARE_PROVIDER_SITE_OTHER): Payer: Medicare Other | Admitting: Vascular Surgery

## 2020-12-06 ENCOUNTER — Encounter (INDEPENDENT_AMBULATORY_CARE_PROVIDER_SITE_OTHER): Payer: Self-pay | Admitting: Vascular Surgery

## 2020-12-06 VITALS — BP 134/74 | HR 57

## 2020-12-06 DIAGNOSIS — E785 Hyperlipidemia, unspecified: Secondary | ICD-10-CM

## 2020-12-06 DIAGNOSIS — M7989 Other specified soft tissue disorders: Secondary | ICD-10-CM | POA: Diagnosis not present

## 2020-12-06 DIAGNOSIS — I724 Aneurysm of artery of lower extremity: Secondary | ICD-10-CM

## 2020-12-06 DIAGNOSIS — I824Y2 Acute embolism and thrombosis of unspecified deep veins of left proximal lower extremity: Secondary | ICD-10-CM

## 2020-12-06 NOTE — Assessment & Plan Note (Signed)
Many years status post stent graft repair.  Stent graft appears to be occluded on today's DVT study, but he has no symptoms that are concerning and no signs of significant lower extremity ischemia.  No intervention for this debilitated gentleman.

## 2020-12-06 NOTE — Assessment & Plan Note (Signed)
Duplex today shows no DVT in either lower extremity concentrating on the left which was the report of leg with DVT previously.  He is on chronic anticoagulation and continues this.  Minimal swelling.  No further vascular recommendations at this point.

## 2020-12-06 NOTE — Progress Notes (Signed)
MRN : [id number]  Anthony Chambers is a 75 y.o. (12/11/1945) male who presents with chief complaint of  Chief Complaint  Patient presents with   Follow-up    3 mo LLE ven dvt   .  History of Present Illness: Patient returns today in follow up of her reported DVT seen previously at another institution.  The patient is not having any significant pain or swelling in his leg at this point.  He has some mild swelling in both legs that he has had for few years but no significant change. Duplex today shows no DVT in either lower extremity concentrating on the left which was the report of leg with DVT previously.  Current Outpatient Medications  Medication Sig Dispense Refill   acetaminophen (TYLENOL) 325 MG tablet Take 650 mg by mouth every 4 (four) hours as needed for mild pain or moderate pain.     acetaminophen (TYLENOL) 500 MG tablet Take 1,000 mg by mouth at bedtime.     albuterol (VENTOLIN HFA) 108 (90 Base) MCG/ACT inhaler Inhale 2 puffs into the lungs every 4 (four) hours as needed for wheezing or shortness of breath.     ergocalciferol (VITAMIN D2) 1.25 MG (50000 UT) capsule Take 50,000 Units by mouth once a week. Every wednesday     furosemide (LASIX) 40 MG tablet Take 40 mg by mouth daily.     Ipratropium-Albuterol (COMBIVENT RESPIMAT) 20-100 MCG/ACT AERS respimat Inhale 1 puff into the lungs in the morning, at noon, in the evening, and at bedtime.     metoprolol succinate (TOPROL-XL) 25 MG 24 hr tablet Take 1 tablet (25 mg total) by mouth daily. Take with or immediately following a meal.     OLANZapine (ZYPREXA) 15 MG tablet Take 30 mg by mouth daily.     omeprazole (PRILOSEC) 20 MG capsule Take 20 mg by mouth daily.     OXYGEN Inhale 4 L/min into the lungs continuous.     risperiDONE (RISPERDAL) 1 MG tablet Take 1 mg by mouth 2 (two) times daily.      rivaroxaban (XARELTO) 20 MG TABS tablet Take 20 mg by mouth daily.     saccharomyces boulardii (FLORASTOR) 250 MG capsule Take 250 mg  by mouth 2 (two) times daily.     sennosides-docusate sodium (SENOKOT-S) 8.6-50 MG tablet Take 1 tablet by mouth in the morning and at bedtime.     simvastatin (ZOCOR) 20 MG tablet Take 20 mg by mouth at bedtime.     tamsulosin (FLOMAX) 0.4 MG CAPS capsule Take 1 capsule (0.4 mg total) by mouth daily after supper. 90 capsule 3   triamcinolone cream (KENALOG) 0.5 % Apply 1 application topically 2 (two) times daily.     No current facility-administered medications for this visit.    Past Medical History:  Diagnosis Date   Depression    GERD (gastroesophageal reflux disease)    Hyperlipidemia    Hypertension    Schizophrenia (HCC)    Total occlusion of coronary artery, chronic     No past surgical history on file.   Social History   Tobacco Use   Smoking status: Former    Years: 40.00    Types: Cigarettes   Smokeless tobacco: Never  Substance Use Topics   Alcohol use: Not Currently   Drug use: Never      Family History  Problem Relation Age of Onset   Asthma Mother   No bleeding or clotting disorders  Allergies  Allergen Reactions  Heparin     REVIEW OF SYSTEMS (Negative unless checked)   Constitutional: [] Weight loss  [] Fever  [] Chills Cardiac: [] Chest pain   [] Chest pressure   [] Palpitations   [] Shortness of breath when laying flat   [] Shortness of breath at rest   [] Shortness of breath with exertion. Vascular:  [] Pain in legs with walking   [] Pain in legs at rest   [] Pain in legs when laying flat   [] Claudication   [] Pain in feet when walking  [] Pain in feet at rest  [] Pain in feet when laying flat   [] History of DVT   [] Phlebitis   [x] Swelling in legs   [] Varicose veins   [] Non-healing ulcers Pulmonary:   [] Uses home oxygen   [] Productive cough   [] Hemoptysis   [] Wheeze  [] COPD   [] Asthma Neurologic:  [] Dizziness  [] Blackouts   [] Seizures   [] History of stroke   [] History of TIA  [] Aphasia   [] Temporary blindness   [] Dysphagia   [] Weakness or numbness in arms    [] Weakness or numbness in legs Musculoskeletal:  [x] Arthritis   [] Joint swelling   [x] Joint pain   [] Low back pain Hematologic:  [] Easy bruising  [] Easy bleeding   [] Hypercoagulable state   [] Anemic  [] Hepatitis Gastrointestinal:  [] Blood in stool   [] Vomiting blood  [x] Gastroesophageal reflux/heartburn   [] Abdominal pain Genitourinary:  [] Chronic kidney disease   [] Difficult urination  [] Frequent urination  [] Burning with urination   [] Hematuria Skin:  [] Rashes   [] Ulcers   [] Wounds Psychological:  [] History of anxiety   [x]  History of major depression.   Physical Examination  BP 134/74   Pulse (!) 57  Gen:  WD/WN, NAD Head: [place]/AT, No temporalis wasting. Ear/Nose/Throat: Hearing grossly intact, nares w/o erythema or drainage Eyes: Conjunctiva clear. Sclera non-icteric Neck: Supple.  Trachea midline Pulmonary:  Good air movement, no use of accessory muscles.  Cardiac: irregular Vascular:  Vessel Right Left  Radial Palpable Palpable                          PT 1+ Palpable 1+ Palpable  DP Not Palpable Not Palpable   Gastrointestinal: soft, non-tender/non-distended. No guarding/reflex.  Musculoskeletal: M/S 5/5 throughout.  No deformity or atrophy. Trace BLE edema. Neurologic: Sensation grossly intact in extremities.  Symmetrical.  Speech is fluent.  Psychiatric: Judgment intact, Mood & affect appropriate for pt's clinical situation. Dermatologic: No rashes or ulcers noted.  No cellulitis or open wounds.      Labs No results found for this or any previous visit (from the past 2160 hour(s)).  Radiology No results found.  Assessment/Plan Hyperlipidemia lipid control important in reducing the progression of atherosclerotic disease. Continue statin therapy     Swelling of limb This is likely a combination of many factors including postphlebitic syndrome and lymphedema in a patient who is poorly mobile with his legs dependent all the time.  Popliteal artery aneurysm  (HCC) Many years status post stent graft repair.  Stent graft appears to be occluded on today's DVT study, but he has no symptoms that are concerning and no signs of significant lower extremity ischemia.  No intervention for this debilitated gentleman.  DVT (deep venous thrombosis) (HCC) Duplex today shows no DVT in either lower extremity concentrating on the left which was the report of leg with DVT previously.  He is on chronic anticoagulation and continues this.  Minimal swelling.  No further vascular recommendations at this point.    , MD  12/06/2020 2:06 PM    This note was created with Dragon medical transcription system.  Any errors from dictation are purely unintentional

## 2020-12-08 DIAGNOSIS — K219 Gastro-esophageal reflux disease without esophagitis: Secondary | ICD-10-CM | POA: Diagnosis not present

## 2020-12-08 DIAGNOSIS — I2582 Chronic total occlusion of coronary artery: Secondary | ICD-10-CM | POA: Diagnosis not present

## 2020-12-08 DIAGNOSIS — J441 Chronic obstructive pulmonary disease with (acute) exacerbation: Secondary | ICD-10-CM | POA: Diagnosis not present

## 2020-12-08 DIAGNOSIS — E539 Vitamin B deficiency, unspecified: Secondary | ICD-10-CM | POA: Diagnosis not present

## 2020-12-08 DIAGNOSIS — E785 Hyperlipidemia, unspecified: Secondary | ICD-10-CM | POA: Diagnosis not present

## 2020-12-08 DIAGNOSIS — I10 Essential (primary) hypertension: Secondary | ICD-10-CM | POA: Diagnosis not present

## 2020-12-08 DIAGNOSIS — I739 Peripheral vascular disease, unspecified: Secondary | ICD-10-CM | POA: Diagnosis not present

## 2020-12-08 DIAGNOSIS — M6281 Muscle weakness (generalized): Secondary | ICD-10-CM | POA: Diagnosis not present

## 2020-12-08 DIAGNOSIS — R52 Pain, unspecified: Secondary | ICD-10-CM | POA: Diagnosis not present

## 2020-12-16 ENCOUNTER — Other Ambulatory Visit: Payer: Self-pay

## 2020-12-16 ENCOUNTER — Non-Acute Institutional Stay: Payer: Medicare Other | Admitting: Primary Care

## 2020-12-16 DIAGNOSIS — Z515 Encounter for palliative care: Secondary | ICD-10-CM

## 2020-12-16 DIAGNOSIS — I5032 Chronic diastolic (congestive) heart failure: Secondary | ICD-10-CM | POA: Insufficient documentation

## 2020-12-16 DIAGNOSIS — I509 Heart failure, unspecified: Secondary | ICD-10-CM | POA: Diagnosis not present

## 2020-12-16 DIAGNOSIS — M6281 Muscle weakness (generalized): Secondary | ICD-10-CM | POA: Diagnosis not present

## 2020-12-16 DIAGNOSIS — J449 Chronic obstructive pulmonary disease, unspecified: Secondary | ICD-10-CM | POA: Diagnosis not present

## 2020-12-16 NOTE — Progress Notes (Signed)
[facility] Consult Note Telephone: [phone]  Fax: [phone]    Date of encounter: 12/16/20 12:42 PM PATIENT NAME: Ridott Leonard [place] [address]   [phone] (home)  DOB: August 11, 1945 MRN: [id number] PRIMARY CARE PROVIDER:    Jennette Bill., MD,  [place] [address] [phone]  REFERRING PROVIDER:   Jennette Bill., MD [address] [place],  [place] [address] [phone]  RESPONSIBLE PARTY:    Contact Information     Name Relation Home Work Mobile   Eddie Candle Sister [phone] [place] Relative   [place] [phone]     [place] [phone]        I met face to face with patient in [facility]. Palliative Care was asked to follow this patient by consultation request of  Hester, Ronnette Hila., MD to address advance care planning and complex medical decision making. This is a follow up visit.                                   ASSESSMENT AND PLAN / RECOMMENDATIONS:   Advance Care Planning/Goals of Care: Goals include to maximize quality of life and symptom management. Our advance care planning conversation included a discussion about:     Exploration of personal, cultural or spiritual beliefs that might influence medical decisions  Sister is HCPOA  CODE STATUS: FULL CODE  Symptom Management/Plan:  I met with patient in his nursing home room. He has recently had follow up for his DVT from earlier this fall. He denies pain or discomfort in his lower extremities. He has 2+ edema and a an abrasion on his right pre-tibia. I would recommend moisturizing cream for his skin.   He states he is enjoying activities in the skilled facility which is also corroborated by staff. He states he likes bingo but doesn't win a lot. He does not see much of his family as his sister is not in good health. He endorses  using CPAP at night and 3 L of oxygen while up. Staff state that he has had stable mood.   Follow up Palliative Care Visit: Palliative care will continue to follow for complex medical decision making, advance care planning, and clarification of goals. Return 8-23 weeks or prn.  I spent 25 minutes providing this consultation. More than 50% of the time in this consultation was spent in counseling and care coordination.  PPS: 40%  HOSPICE ELIGIBILITY/DIAGNOSIS: TBD  Chief Complaint: depression, debility  HISTORY OF PRESENT ILLNESS:  SHAKEEL DISNEY is a 75 y.o. year old male  with developmental delay, schizophrenia, immobility,  COPD.   History obtained from review of EMR, discussion with primary team, and interview with family, facility staff/caregiver and/or Mr. Pursley.  I reviewed available labs, medications, imaging, studies and related documents from the EMR.  Records reviewed and summarized above.   ROS   General: NAD ENMT: denies dysphagia Cardiovascular: denies chest pain, denies DOE Pulmonary: denies cough, denies increased SOB Abdomen: endorses good appetite, denies constipation, endorses continence of bowel GU: denies dysuria, endorses continence of urine MSK:  endorses baseline weakness,  no falls reported Skin: denies rashes or wounds Neurological: denies pain, denies insomnia Psych: Endorses positive mood Heme/lymph/immuno: denies bruises, abnormal bleeding  Physical Exam: Current and past weights: 231 lbs, BMI 30.5 Constitutional:  NAD General: frail appearing, obese  EYES: anicteric sclera, lids intact, no discharge  ENMT: intact hearing, oral mucous membranes moist, edentulous CV: S1S2, RRR, 2+ LE edema Pulmonary: LCTA, no increased work of breathing, no cough, oxygen 3 l / [place] Abdomen: intake 100%, , no ascites GU: deferred MSK: mild  sarcopenia, moves all extremities,, non  ambulatory Skin: warm and dry, wound on R pre tibia Neuro:  ++ generalized weakness,   ++ cognitive impairment Psych: non-anxious affect, A and O x 2-3 Hem/lymph/immuno: no widespread bruising   Thank you for the opportunity to participate in the care of Mr. Ocain.  The palliative care team will continue to follow. Please call our office at [phone] if we can be of additional assistance.   Jason Coop, NP DNP, AGPCNP-BC  COVID-19 PATIENT SCREENING TOOL Asked and negative response unless otherwise noted:   Have you had symptoms of covid, tested positive or been in contact with someone with symptoms/positive test in the past 5-10 days?

## 2021-01-10 DIAGNOSIS — I509 Heart failure, unspecified: Secondary | ICD-10-CM | POA: Diagnosis not present

## 2021-01-10 DIAGNOSIS — I82402 Acute embolism and thrombosis of unspecified deep veins of left lower extremity: Secondary | ICD-10-CM | POA: Diagnosis not present

## 2021-01-10 DIAGNOSIS — J441 Chronic obstructive pulmonary disease with (acute) exacerbation: Secondary | ICD-10-CM | POA: Diagnosis not present

## 2021-01-10 DIAGNOSIS — I10 Essential (primary) hypertension: Secondary | ICD-10-CM | POA: Diagnosis not present

## 2021-01-10 DIAGNOSIS — R0902 Hypoxemia: Secondary | ICD-10-CM | POA: Diagnosis not present

## 2021-01-10 DIAGNOSIS — I2510 Atherosclerotic heart disease of native coronary artery without angina pectoris: Secondary | ICD-10-CM | POA: Diagnosis not present

## 2021-01-10 DIAGNOSIS — D689 Coagulation defect, unspecified: Secondary | ICD-10-CM | POA: Diagnosis not present

## 2021-01-10 DIAGNOSIS — E785 Hyperlipidemia, unspecified: Secondary | ICD-10-CM | POA: Diagnosis not present

## 2021-01-11 DIAGNOSIS — Z01818 Encounter for other preprocedural examination: Secondary | ICD-10-CM | POA: Diagnosis not present

## 2021-01-11 DIAGNOSIS — J449 Chronic obstructive pulmonary disease, unspecified: Secondary | ICD-10-CM | POA: Diagnosis not present

## 2021-01-19 DIAGNOSIS — J449 Chronic obstructive pulmonary disease, unspecified: Secondary | ICD-10-CM | POA: Diagnosis not present

## 2021-01-27 DIAGNOSIS — R0902 Hypoxemia: Secondary | ICD-10-CM | POA: Diagnosis not present

## 2021-01-27 DIAGNOSIS — E785 Hyperlipidemia, unspecified: Secondary | ICD-10-CM | POA: Diagnosis not present

## 2021-01-27 DIAGNOSIS — I739 Peripheral vascular disease, unspecified: Secondary | ICD-10-CM | POA: Diagnosis not present

## 2021-01-27 DIAGNOSIS — K219 Gastro-esophageal reflux disease without esophagitis: Secondary | ICD-10-CM | POA: Diagnosis not present

## 2021-01-27 DIAGNOSIS — J441 Chronic obstructive pulmonary disease with (acute) exacerbation: Secondary | ICD-10-CM | POA: Diagnosis not present

## 2021-01-27 DIAGNOSIS — M6281 Muscle weakness (generalized): Secondary | ICD-10-CM | POA: Diagnosis not present

## 2021-01-27 DIAGNOSIS — I2582 Chronic total occlusion of coronary artery: Secondary | ICD-10-CM | POA: Diagnosis not present

## 2021-01-27 DIAGNOSIS — I10 Essential (primary) hypertension: Secondary | ICD-10-CM | POA: Diagnosis not present

## 2021-01-27 DIAGNOSIS — R52 Pain, unspecified: Secondary | ICD-10-CM | POA: Diagnosis not present

## 2021-01-27 DIAGNOSIS — E539 Vitamin B deficiency, unspecified: Secondary | ICD-10-CM | POA: Diagnosis not present

## 2021-01-30 DIAGNOSIS — K219 Gastro-esophageal reflux disease without esophagitis: Secondary | ICD-10-CM | POA: Diagnosis not present

## 2021-01-30 DIAGNOSIS — I739 Peripheral vascular disease, unspecified: Secondary | ICD-10-CM | POA: Diagnosis not present

## 2021-01-30 DIAGNOSIS — I2582 Chronic total occlusion of coronary artery: Secondary | ICD-10-CM | POA: Diagnosis not present

## 2021-01-30 DIAGNOSIS — R0902 Hypoxemia: Secondary | ICD-10-CM | POA: Diagnosis not present

## 2021-01-30 DIAGNOSIS — M6281 Muscle weakness (generalized): Secondary | ICD-10-CM | POA: Diagnosis not present

## 2021-01-30 DIAGNOSIS — J441 Chronic obstructive pulmonary disease with (acute) exacerbation: Secondary | ICD-10-CM | POA: Diagnosis not present

## 2021-01-30 DIAGNOSIS — E785 Hyperlipidemia, unspecified: Secondary | ICD-10-CM | POA: Diagnosis not present

## 2021-01-30 DIAGNOSIS — I10 Essential (primary) hypertension: Secondary | ICD-10-CM | POA: Diagnosis not present

## 2021-01-30 DIAGNOSIS — E539 Vitamin B deficiency, unspecified: Secondary | ICD-10-CM | POA: Diagnosis not present

## 2021-01-30 DIAGNOSIS — R52 Pain, unspecified: Secondary | ICD-10-CM | POA: Diagnosis not present

## 2021-01-31 DIAGNOSIS — I2582 Chronic total occlusion of coronary artery: Secondary | ICD-10-CM | POA: Diagnosis not present

## 2021-01-31 DIAGNOSIS — K5900 Constipation, unspecified: Secondary | ICD-10-CM | POA: Diagnosis not present

## 2021-01-31 DIAGNOSIS — E785 Hyperlipidemia, unspecified: Secondary | ICD-10-CM | POA: Diagnosis not present

## 2021-01-31 DIAGNOSIS — K219 Gastro-esophageal reflux disease without esophagitis: Secondary | ICD-10-CM | POA: Diagnosis not present

## 2021-01-31 DIAGNOSIS — J441 Chronic obstructive pulmonary disease with (acute) exacerbation: Secondary | ICD-10-CM | POA: Diagnosis not present

## 2021-01-31 DIAGNOSIS — E539 Vitamin B deficiency, unspecified: Secondary | ICD-10-CM | POA: Diagnosis not present

## 2021-01-31 DIAGNOSIS — R52 Pain, unspecified: Secondary | ICD-10-CM | POA: Diagnosis not present

## 2021-01-31 DIAGNOSIS — M6281 Muscle weakness (generalized): Secondary | ICD-10-CM | POA: Diagnosis not present

## 2021-01-31 DIAGNOSIS — I739 Peripheral vascular disease, unspecified: Secondary | ICD-10-CM | POA: Diagnosis not present

## 2021-01-31 DIAGNOSIS — R0902 Hypoxemia: Secondary | ICD-10-CM | POA: Diagnosis not present

## 2021-01-31 DIAGNOSIS — I10 Essential (primary) hypertension: Secondary | ICD-10-CM | POA: Diagnosis not present

## 2021-02-01 DIAGNOSIS — R0902 Hypoxemia: Secondary | ICD-10-CM | POA: Diagnosis not present

## 2021-02-01 DIAGNOSIS — E539 Vitamin B deficiency, unspecified: Secondary | ICD-10-CM | POA: Diagnosis not present

## 2021-02-01 DIAGNOSIS — R52 Pain, unspecified: Secondary | ICD-10-CM | POA: Diagnosis not present

## 2021-02-01 DIAGNOSIS — I10 Essential (primary) hypertension: Secondary | ICD-10-CM | POA: Diagnosis not present

## 2021-02-01 DIAGNOSIS — I739 Peripheral vascular disease, unspecified: Secondary | ICD-10-CM | POA: Diagnosis not present

## 2021-02-01 DIAGNOSIS — J441 Chronic obstructive pulmonary disease with (acute) exacerbation: Secondary | ICD-10-CM | POA: Diagnosis not present

## 2021-02-01 DIAGNOSIS — K219 Gastro-esophageal reflux disease without esophagitis: Secondary | ICD-10-CM | POA: Diagnosis not present

## 2021-02-01 DIAGNOSIS — M6281 Muscle weakness (generalized): Secondary | ICD-10-CM | POA: Diagnosis not present

## 2021-02-01 DIAGNOSIS — E785 Hyperlipidemia, unspecified: Secondary | ICD-10-CM | POA: Diagnosis not present

## 2021-02-01 DIAGNOSIS — I2582 Chronic total occlusion of coronary artery: Secondary | ICD-10-CM | POA: Diagnosis not present

## 2021-02-02 DIAGNOSIS — R0902 Hypoxemia: Secondary | ICD-10-CM | POA: Diagnosis not present

## 2021-02-02 DIAGNOSIS — I739 Peripheral vascular disease, unspecified: Secondary | ICD-10-CM | POA: Diagnosis not present

## 2021-02-02 DIAGNOSIS — J441 Chronic obstructive pulmonary disease with (acute) exacerbation: Secondary | ICD-10-CM | POA: Diagnosis not present

## 2021-02-02 DIAGNOSIS — E539 Vitamin B deficiency, unspecified: Secondary | ICD-10-CM | POA: Diagnosis not present

## 2021-02-02 DIAGNOSIS — E785 Hyperlipidemia, unspecified: Secondary | ICD-10-CM | POA: Diagnosis not present

## 2021-02-02 DIAGNOSIS — R52 Pain, unspecified: Secondary | ICD-10-CM | POA: Diagnosis not present

## 2021-02-02 DIAGNOSIS — I10 Essential (primary) hypertension: Secondary | ICD-10-CM | POA: Diagnosis not present

## 2021-02-02 DIAGNOSIS — M6281 Muscle weakness (generalized): Secondary | ICD-10-CM | POA: Diagnosis not present

## 2021-02-02 DIAGNOSIS — I2582 Chronic total occlusion of coronary artery: Secondary | ICD-10-CM | POA: Diagnosis not present

## 2021-02-02 DIAGNOSIS — K219 Gastro-esophageal reflux disease without esophagitis: Secondary | ICD-10-CM | POA: Diagnosis not present

## 2021-02-03 DIAGNOSIS — I2582 Chronic total occlusion of coronary artery: Secondary | ICD-10-CM | POA: Diagnosis not present

## 2021-02-03 DIAGNOSIS — E785 Hyperlipidemia, unspecified: Secondary | ICD-10-CM | POA: Diagnosis not present

## 2021-02-03 DIAGNOSIS — I739 Peripheral vascular disease, unspecified: Secondary | ICD-10-CM | POA: Diagnosis not present

## 2021-02-03 DIAGNOSIS — J441 Chronic obstructive pulmonary disease with (acute) exacerbation: Secondary | ICD-10-CM | POA: Diagnosis not present

## 2021-02-03 DIAGNOSIS — R0902 Hypoxemia: Secondary | ICD-10-CM | POA: Diagnosis not present

## 2021-02-03 DIAGNOSIS — M6281 Muscle weakness (generalized): Secondary | ICD-10-CM | POA: Diagnosis not present

## 2021-02-03 DIAGNOSIS — I10 Essential (primary) hypertension: Secondary | ICD-10-CM | POA: Diagnosis not present

## 2021-02-03 DIAGNOSIS — R52 Pain, unspecified: Secondary | ICD-10-CM | POA: Diagnosis not present

## 2021-02-03 DIAGNOSIS — E539 Vitamin B deficiency, unspecified: Secondary | ICD-10-CM | POA: Diagnosis not present

## 2021-02-03 DIAGNOSIS — K219 Gastro-esophageal reflux disease without esophagitis: Secondary | ICD-10-CM | POA: Diagnosis not present

## 2021-02-06 DIAGNOSIS — J441 Chronic obstructive pulmonary disease with (acute) exacerbation: Secondary | ICD-10-CM | POA: Diagnosis not present

## 2021-02-06 DIAGNOSIS — E785 Hyperlipidemia, unspecified: Secondary | ICD-10-CM | POA: Diagnosis not present

## 2021-02-06 DIAGNOSIS — M6281 Muscle weakness (generalized): Secondary | ICD-10-CM | POA: Diagnosis not present

## 2021-02-06 DIAGNOSIS — K219 Gastro-esophageal reflux disease without esophagitis: Secondary | ICD-10-CM | POA: Diagnosis not present

## 2021-02-06 DIAGNOSIS — E539 Vitamin B deficiency, unspecified: Secondary | ICD-10-CM | POA: Diagnosis not present

## 2021-02-06 DIAGNOSIS — R52 Pain, unspecified: Secondary | ICD-10-CM | POA: Diagnosis not present

## 2021-02-06 DIAGNOSIS — R0902 Hypoxemia: Secondary | ICD-10-CM | POA: Diagnosis not present

## 2021-02-06 DIAGNOSIS — I739 Peripheral vascular disease, unspecified: Secondary | ICD-10-CM | POA: Diagnosis not present

## 2021-02-06 DIAGNOSIS — I10 Essential (primary) hypertension: Secondary | ICD-10-CM | POA: Diagnosis not present

## 2021-02-06 DIAGNOSIS — I2582 Chronic total occlusion of coronary artery: Secondary | ICD-10-CM | POA: Diagnosis not present

## 2021-02-07 DIAGNOSIS — R52 Pain, unspecified: Secondary | ICD-10-CM | POA: Diagnosis not present

## 2021-02-07 DIAGNOSIS — J9620 Acute and chronic respiratory failure, unspecified whether with hypoxia or hypercapnia: Secondary | ICD-10-CM | POA: Diagnosis not present

## 2021-02-07 DIAGNOSIS — M6281 Muscle weakness (generalized): Secondary | ICD-10-CM | POA: Diagnosis not present

## 2021-02-07 DIAGNOSIS — I10 Essential (primary) hypertension: Secondary | ICD-10-CM | POA: Diagnosis not present

## 2021-02-07 DIAGNOSIS — L603 Nail dystrophy: Secondary | ICD-10-CM | POA: Diagnosis not present

## 2021-02-07 DIAGNOSIS — R918 Other nonspecific abnormal finding of lung field: Secondary | ICD-10-CM | POA: Diagnosis not present

## 2021-02-07 DIAGNOSIS — I2582 Chronic total occlusion of coronary artery: Secondary | ICD-10-CM | POA: Diagnosis not present

## 2021-02-07 DIAGNOSIS — E539 Vitamin B deficiency, unspecified: Secondary | ICD-10-CM | POA: Diagnosis not present

## 2021-02-07 DIAGNOSIS — J441 Chronic obstructive pulmonary disease with (acute) exacerbation: Secondary | ICD-10-CM | POA: Diagnosis not present

## 2021-02-07 DIAGNOSIS — R0902 Hypoxemia: Secondary | ICD-10-CM | POA: Diagnosis not present

## 2021-02-07 DIAGNOSIS — K219 Gastro-esophageal reflux disease without esophagitis: Secondary | ICD-10-CM | POA: Diagnosis not present

## 2021-02-07 DIAGNOSIS — K5900 Constipation, unspecified: Secondary | ICD-10-CM | POA: Diagnosis not present

## 2021-02-07 DIAGNOSIS — I739 Peripheral vascular disease, unspecified: Secondary | ICD-10-CM | POA: Diagnosis not present

## 2021-02-07 DIAGNOSIS — E785 Hyperlipidemia, unspecified: Secondary | ICD-10-CM | POA: Diagnosis not present

## 2021-02-08 DIAGNOSIS — R52 Pain, unspecified: Secondary | ICD-10-CM | POA: Diagnosis not present

## 2021-02-08 DIAGNOSIS — K5900 Constipation, unspecified: Secondary | ICD-10-CM | POA: Diagnosis not present

## 2021-02-08 DIAGNOSIS — M6281 Muscle weakness (generalized): Secondary | ICD-10-CM | POA: Diagnosis not present

## 2021-02-08 DIAGNOSIS — E539 Vitamin B deficiency, unspecified: Secondary | ICD-10-CM | POA: Diagnosis not present

## 2021-02-08 DIAGNOSIS — E785 Hyperlipidemia, unspecified: Secondary | ICD-10-CM | POA: Diagnosis not present

## 2021-02-08 DIAGNOSIS — J441 Chronic obstructive pulmonary disease with (acute) exacerbation: Secondary | ICD-10-CM | POA: Diagnosis not present

## 2021-02-08 DIAGNOSIS — K219 Gastro-esophageal reflux disease without esophagitis: Secondary | ICD-10-CM | POA: Diagnosis not present

## 2021-02-08 DIAGNOSIS — I739 Peripheral vascular disease, unspecified: Secondary | ICD-10-CM | POA: Diagnosis not present

## 2021-02-08 DIAGNOSIS — R0902 Hypoxemia: Secondary | ICD-10-CM | POA: Diagnosis not present

## 2021-02-08 DIAGNOSIS — I2582 Chronic total occlusion of coronary artery: Secondary | ICD-10-CM | POA: Diagnosis not present

## 2021-02-08 DIAGNOSIS — I10 Essential (primary) hypertension: Secondary | ICD-10-CM | POA: Diagnosis not present

## 2021-02-09 DIAGNOSIS — E785 Hyperlipidemia, unspecified: Secondary | ICD-10-CM | POA: Diagnosis not present

## 2021-02-09 DIAGNOSIS — I10 Essential (primary) hypertension: Secondary | ICD-10-CM | POA: Diagnosis not present

## 2021-02-09 DIAGNOSIS — I739 Peripheral vascular disease, unspecified: Secondary | ICD-10-CM | POA: Diagnosis not present

## 2021-02-09 DIAGNOSIS — R0902 Hypoxemia: Secondary | ICD-10-CM | POA: Diagnosis not present

## 2021-02-09 DIAGNOSIS — K219 Gastro-esophageal reflux disease without esophagitis: Secondary | ICD-10-CM | POA: Diagnosis not present

## 2021-02-09 DIAGNOSIS — I2582 Chronic total occlusion of coronary artery: Secondary | ICD-10-CM | POA: Diagnosis not present

## 2021-02-09 DIAGNOSIS — N179 Acute kidney failure, unspecified: Secondary | ICD-10-CM | POA: Diagnosis not present

## 2021-02-09 DIAGNOSIS — M6281 Muscle weakness (generalized): Secondary | ICD-10-CM | POA: Diagnosis not present

## 2021-02-09 DIAGNOSIS — E539 Vitamin B deficiency, unspecified: Secondary | ICD-10-CM | POA: Diagnosis not present

## 2021-02-09 DIAGNOSIS — R52 Pain, unspecified: Secondary | ICD-10-CM | POA: Diagnosis not present

## 2021-02-09 DIAGNOSIS — J441 Chronic obstructive pulmonary disease with (acute) exacerbation: Secondary | ICD-10-CM | POA: Diagnosis not present

## 2021-02-10 DIAGNOSIS — K219 Gastro-esophageal reflux disease without esophagitis: Secondary | ICD-10-CM | POA: Diagnosis not present

## 2021-02-10 DIAGNOSIS — I739 Peripheral vascular disease, unspecified: Secondary | ICD-10-CM | POA: Diagnosis not present

## 2021-02-10 DIAGNOSIS — E539 Vitamin B deficiency, unspecified: Secondary | ICD-10-CM | POA: Diagnosis not present

## 2021-02-10 DIAGNOSIS — I2582 Chronic total occlusion of coronary artery: Secondary | ICD-10-CM | POA: Diagnosis not present

## 2021-02-10 DIAGNOSIS — M6281 Muscle weakness (generalized): Secondary | ICD-10-CM | POA: Diagnosis not present

## 2021-02-10 DIAGNOSIS — J441 Chronic obstructive pulmonary disease with (acute) exacerbation: Secondary | ICD-10-CM | POA: Diagnosis not present

## 2021-02-10 DIAGNOSIS — I10 Essential (primary) hypertension: Secondary | ICD-10-CM | POA: Diagnosis not present

## 2021-02-10 DIAGNOSIS — R0902 Hypoxemia: Secondary | ICD-10-CM | POA: Diagnosis not present

## 2021-02-10 DIAGNOSIS — E785 Hyperlipidemia, unspecified: Secondary | ICD-10-CM | POA: Diagnosis not present

## 2021-02-10 DIAGNOSIS — R52 Pain, unspecified: Secondary | ICD-10-CM | POA: Diagnosis not present

## 2021-02-13 DIAGNOSIS — I739 Peripheral vascular disease, unspecified: Secondary | ICD-10-CM | POA: Diagnosis not present

## 2021-02-13 DIAGNOSIS — I10 Essential (primary) hypertension: Secondary | ICD-10-CM | POA: Diagnosis not present

## 2021-02-13 DIAGNOSIS — E785 Hyperlipidemia, unspecified: Secondary | ICD-10-CM | POA: Diagnosis not present

## 2021-02-13 DIAGNOSIS — J441 Chronic obstructive pulmonary disease with (acute) exacerbation: Secondary | ICD-10-CM | POA: Diagnosis not present

## 2021-02-13 DIAGNOSIS — R52 Pain, unspecified: Secondary | ICD-10-CM | POA: Diagnosis not present

## 2021-02-13 DIAGNOSIS — M6281 Muscle weakness (generalized): Secondary | ICD-10-CM | POA: Diagnosis not present

## 2021-02-13 DIAGNOSIS — K219 Gastro-esophageal reflux disease without esophagitis: Secondary | ICD-10-CM | POA: Diagnosis not present

## 2021-02-13 DIAGNOSIS — E539 Vitamin B deficiency, unspecified: Secondary | ICD-10-CM | POA: Diagnosis not present

## 2021-02-13 DIAGNOSIS — R0902 Hypoxemia: Secondary | ICD-10-CM | POA: Diagnosis not present

## 2021-02-13 DIAGNOSIS — I2582 Chronic total occlusion of coronary artery: Secondary | ICD-10-CM | POA: Diagnosis not present

## 2021-02-14 DIAGNOSIS — J441 Chronic obstructive pulmonary disease with (acute) exacerbation: Secondary | ICD-10-CM | POA: Diagnosis not present

## 2021-02-14 DIAGNOSIS — R0902 Hypoxemia: Secondary | ICD-10-CM | POA: Diagnosis not present

## 2021-02-14 DIAGNOSIS — M6281 Muscle weakness (generalized): Secondary | ICD-10-CM | POA: Diagnosis not present

## 2021-02-14 DIAGNOSIS — I10 Essential (primary) hypertension: Secondary | ICD-10-CM | POA: Diagnosis not present

## 2021-02-14 DIAGNOSIS — E539 Vitamin B deficiency, unspecified: Secondary | ICD-10-CM | POA: Diagnosis not present

## 2021-02-14 DIAGNOSIS — E785 Hyperlipidemia, unspecified: Secondary | ICD-10-CM | POA: Diagnosis not present

## 2021-02-14 DIAGNOSIS — I739 Peripheral vascular disease, unspecified: Secondary | ICD-10-CM | POA: Diagnosis not present

## 2021-02-14 DIAGNOSIS — R52 Pain, unspecified: Secondary | ICD-10-CM | POA: Diagnosis not present

## 2021-02-14 DIAGNOSIS — K219 Gastro-esophageal reflux disease without esophagitis: Secondary | ICD-10-CM | POA: Diagnosis not present

## 2021-02-14 DIAGNOSIS — I2582 Chronic total occlusion of coronary artery: Secondary | ICD-10-CM | POA: Diagnosis not present

## 2021-02-15 DIAGNOSIS — E785 Hyperlipidemia, unspecified: Secondary | ICD-10-CM | POA: Diagnosis not present

## 2021-02-15 DIAGNOSIS — J441 Chronic obstructive pulmonary disease with (acute) exacerbation: Secondary | ICD-10-CM | POA: Diagnosis not present

## 2021-02-15 DIAGNOSIS — R52 Pain, unspecified: Secondary | ICD-10-CM | POA: Diagnosis not present

## 2021-02-15 DIAGNOSIS — I739 Peripheral vascular disease, unspecified: Secondary | ICD-10-CM | POA: Diagnosis not present

## 2021-02-15 DIAGNOSIS — I10 Essential (primary) hypertension: Secondary | ICD-10-CM | POA: Diagnosis not present

## 2021-02-15 DIAGNOSIS — I2582 Chronic total occlusion of coronary artery: Secondary | ICD-10-CM | POA: Diagnosis not present

## 2021-02-15 DIAGNOSIS — R0902 Hypoxemia: Secondary | ICD-10-CM | POA: Diagnosis not present

## 2021-02-15 DIAGNOSIS — K219 Gastro-esophageal reflux disease without esophagitis: Secondary | ICD-10-CM | POA: Diagnosis not present

## 2021-02-15 DIAGNOSIS — E539 Vitamin B deficiency, unspecified: Secondary | ICD-10-CM | POA: Diagnosis not present

## 2021-02-15 DIAGNOSIS — M6281 Muscle weakness (generalized): Secondary | ICD-10-CM | POA: Diagnosis not present

## 2021-02-16 DIAGNOSIS — E539 Vitamin B deficiency, unspecified: Secondary | ICD-10-CM | POA: Diagnosis not present

## 2021-02-16 DIAGNOSIS — R0902 Hypoxemia: Secondary | ICD-10-CM | POA: Diagnosis not present

## 2021-02-16 DIAGNOSIS — I10 Essential (primary) hypertension: Secondary | ICD-10-CM | POA: Diagnosis not present

## 2021-02-16 DIAGNOSIS — R52 Pain, unspecified: Secondary | ICD-10-CM | POA: Diagnosis not present

## 2021-02-16 DIAGNOSIS — M6281 Muscle weakness (generalized): Secondary | ICD-10-CM | POA: Diagnosis not present

## 2021-02-16 DIAGNOSIS — I2582 Chronic total occlusion of coronary artery: Secondary | ICD-10-CM | POA: Diagnosis not present

## 2021-02-16 DIAGNOSIS — J441 Chronic obstructive pulmonary disease with (acute) exacerbation: Secondary | ICD-10-CM | POA: Diagnosis not present

## 2021-02-16 DIAGNOSIS — I739 Peripheral vascular disease, unspecified: Secondary | ICD-10-CM | POA: Diagnosis not present

## 2021-02-16 DIAGNOSIS — E785 Hyperlipidemia, unspecified: Secondary | ICD-10-CM | POA: Diagnosis not present

## 2021-02-16 DIAGNOSIS — K219 Gastro-esophageal reflux disease without esophagitis: Secondary | ICD-10-CM | POA: Diagnosis not present

## 2021-02-17 DIAGNOSIS — K219 Gastro-esophageal reflux disease without esophagitis: Secondary | ICD-10-CM | POA: Diagnosis not present

## 2021-02-17 DIAGNOSIS — I739 Peripheral vascular disease, unspecified: Secondary | ICD-10-CM | POA: Diagnosis not present

## 2021-02-17 DIAGNOSIS — E785 Hyperlipidemia, unspecified: Secondary | ICD-10-CM | POA: Diagnosis not present

## 2021-02-17 DIAGNOSIS — I10 Essential (primary) hypertension: Secondary | ICD-10-CM | POA: Diagnosis not present

## 2021-02-17 DIAGNOSIS — E539 Vitamin B deficiency, unspecified: Secondary | ICD-10-CM | POA: Diagnosis not present

## 2021-02-17 DIAGNOSIS — M6281 Muscle weakness (generalized): Secondary | ICD-10-CM | POA: Diagnosis not present

## 2021-02-17 DIAGNOSIS — I2582 Chronic total occlusion of coronary artery: Secondary | ICD-10-CM | POA: Diagnosis not present

## 2021-02-17 DIAGNOSIS — J441 Chronic obstructive pulmonary disease with (acute) exacerbation: Secondary | ICD-10-CM | POA: Diagnosis not present

## 2021-02-17 DIAGNOSIS — R0902 Hypoxemia: Secondary | ICD-10-CM | POA: Diagnosis not present

## 2021-02-17 DIAGNOSIS — R52 Pain, unspecified: Secondary | ICD-10-CM | POA: Diagnosis not present

## 2021-02-24 ENCOUNTER — Non-Acute Institutional Stay: Payer: Medicare Other | Admitting: Primary Care

## 2021-02-24 ENCOUNTER — Other Ambulatory Visit: Payer: Self-pay

## 2021-02-24 DIAGNOSIS — J449 Chronic obstructive pulmonary disease, unspecified: Secondary | ICD-10-CM | POA: Diagnosis not present

## 2021-02-24 DIAGNOSIS — Z515 Encounter for palliative care: Secondary | ICD-10-CM

## 2021-02-24 DIAGNOSIS — I509 Heart failure, unspecified: Secondary | ICD-10-CM

## 2021-02-24 DIAGNOSIS — M6281 Muscle weakness (generalized): Secondary | ICD-10-CM | POA: Diagnosis not present

## 2021-02-24 NOTE — Progress Notes (Addendum)
[facility] Palliative Care Consult Note Telephone: [phone]  Fax: [phone]    Date of encounter: 02/24/21 11:44 AM PATIENT NAME: Anthony Chambers [address] [place] [address]   [phone] (home)  DOB: 1945-05-19 MRN: [id number] PRIMARY CARE PROVIDER:    Rica Koyanagi, MD,  [place] [address] [phone]  REFERRING PROVIDER:   Rica Koyanagi, [place] [address] [place],  [place] [address] [phone]  RESPONSIBLE PARTY:    Contact Information     Name Relation Home Work Mobile   Eddie Candle Sister [phone] [place] Relative   [phone]   Ottie Glazier [phone]     [place] [phone]         I met face to face [facility] in [facility]. Palliative Care was asked to follow this patient by consultation request of  Rica Koyanagi, MD to address advance care planning and complex medical decision making. This is a follow up visit.                                   ASSESSMENT AND PLAN / RECOMMENDATIONS:   Advance Care Planning/Goals of Care: Goals include to maximize quality of life and symptom management. Patient/health care surrogate gave his/her permission to discuss.Our advance care planning conversation included a discussion about:     Exploration of personal, cultural or spiritual beliefs that might influence medical decisions  CODE STATUS: FULL  Symptom Management/Plan:  Recent f/u with pulmonary, was started on combivent inhaler. Pt does not have oxygen on currently, but places once reminded. He is using oxygen and cpap at hs. He endorses having stopped smoking but states he enjoyed cigars. He says he would smoke one if he had it. He does like bingo and states he may go to the movie today. He would benefit from being encouraged to go to activities. I usually find him sitting in the w/c in his room with his roommate. They do not interact  however.  He endorses he is eating well.  Wt is stable at 228 lbs  Follow up Palliative Care Visit: Palliative care will continue to follow for complex medical decision making, advance care planning, and clarification of goals. Return 8 weeks or prn.  I spent 15 minutes providing this consultation. More than 50% of the time in this consultation was spent in counseling and care coordination.  PPS: 40%  HOSPICE ELIGIBILITY/DIAGNOSIS: no  Chief Complaint: debility  HISTORY OF PRESENT ILLNESS:  Anthony Chambers is a 76 y.o. year old male  with COPD, schizophrenia, muscle weakness, debility .   History obtained from review of EMR, discussion with primary team, and interview with family, facility staff/caregiver and/or Anthony Chambers.  I reviewed available labs, medications, imaging, studies and related documents from the EMR.  Records reviewed and summarized above.   ROS  General: NAD EYES: denies vision changes ENMT: denies dysphagia Cardiovascular: denies chest pain, denies DOE Pulmonary: denies cough, denies increased SOB Abdomen: endorses good appetite, denies constipation, endorses continence of bowel GU: denies dysuria, endorses continence of urine MSK:  denies  increased weakness,  no falls reported Skin: denies rashes or wounds Neurological: denies pain, denies insomnia Psych: Endorses positive mood Heme/lymph/immuno: denies bruises, abnormal bleeding  Physical Exam: Current and past weights: 228 lbs, stable  Constitutional: NAD General: frail appearing, obese  EYES: anicteric sclera, lids intact,  no discharge  ENMT: intact hearing, oral mucous membranes moist CV: 1+ bil LE edema Pulmonary:no increased work of breathing, no cough, oyxgen 4 L Abdomen: intake 75-100%, no ascites GU: deferred MSK: slight sarcopenia, moves all extremities, non  ambulatory Skin: warm and dry, no rashes or wounds on visible skin Neuro:  no new  generalized weakness,  +cognitive impairment Psych:  non-anxious affect, A and O x 3 Hem/lymph/immuno: no widespread bruising   Thank you for the opportunity to participate in the care of Anthony Chambers.  The palliative care team will continue to follow. Please call our office at [phone] if we can be of additional assistance.   Jason Coop, NP DNP, AGPCNP-BC  COVID-19 PATIENT SCREENING TOOL Asked and negative response unless otherwise noted:   Have you had symptoms of covid, tested positive or been in contact with someone with symptoms/positive test in the past 5-10 days?

## 2021-03-01 DIAGNOSIS — J9620 Acute and chronic respiratory failure, unspecified whether with hypoxia or hypercapnia: Secondary | ICD-10-CM | POA: Diagnosis not present

## 2021-03-10 DIAGNOSIS — E559 Vitamin D deficiency, unspecified: Secondary | ICD-10-CM | POA: Diagnosis not present

## 2021-03-10 DIAGNOSIS — E785 Hyperlipidemia, unspecified: Secondary | ICD-10-CM | POA: Diagnosis not present

## 2021-03-10 DIAGNOSIS — D511 Vitamin B12 deficiency anemia due to selective vitamin B12 malabsorption with proteinuria: Secondary | ICD-10-CM | POA: Diagnosis not present

## 2021-03-10 DIAGNOSIS — I5022 Chronic systolic (congestive) heart failure: Secondary | ICD-10-CM | POA: Diagnosis not present

## 2021-03-13 DIAGNOSIS — G3184 Mild cognitive impairment, so stated: Secondary | ICD-10-CM | POA: Diagnosis not present

## 2021-03-13 DIAGNOSIS — J9620 Acute and chronic respiratory failure, unspecified whether with hypoxia or hypercapnia: Secondary | ICD-10-CM | POA: Diagnosis not present

## 2021-03-13 DIAGNOSIS — R918 Other nonspecific abnormal finding of lung field: Secondary | ICD-10-CM | POA: Diagnosis not present

## 2021-03-13 DIAGNOSIS — M6281 Muscle weakness (generalized): Secondary | ICD-10-CM | POA: Diagnosis not present

## 2021-03-14 DIAGNOSIS — J441 Chronic obstructive pulmonary disease with (acute) exacerbation: Secondary | ICD-10-CM | POA: Diagnosis not present

## 2021-03-14 DIAGNOSIS — I5022 Chronic systolic (congestive) heart failure: Secondary | ICD-10-CM | POA: Diagnosis not present

## 2021-03-22 DIAGNOSIS — J441 Chronic obstructive pulmonary disease with (acute) exacerbation: Secondary | ICD-10-CM | POA: Diagnosis not present

## 2021-04-12 DIAGNOSIS — J439 Emphysema, unspecified: Secondary | ICD-10-CM | POA: Diagnosis not present

## 2021-04-12 DIAGNOSIS — J441 Chronic obstructive pulmonary disease with (acute) exacerbation: Secondary | ICD-10-CM | POA: Diagnosis not present

## 2021-04-12 DIAGNOSIS — J984 Other disorders of lung: Secondary | ICD-10-CM | POA: Diagnosis not present

## 2021-04-13 DIAGNOSIS — J441 Chronic obstructive pulmonary disease with (acute) exacerbation: Secondary | ICD-10-CM | POA: Diagnosis not present

## 2021-04-19 DIAGNOSIS — J441 Chronic obstructive pulmonary disease with (acute) exacerbation: Secondary | ICD-10-CM | POA: Diagnosis not present

## 2021-04-24 DIAGNOSIS — Z741 Need for assistance with personal care: Secondary | ICD-10-CM | POA: Diagnosis not present

## 2021-04-24 DIAGNOSIS — E785 Hyperlipidemia, unspecified: Secondary | ICD-10-CM | POA: Diagnosis not present

## 2021-04-24 DIAGNOSIS — E539 Vitamin B deficiency, unspecified: Secondary | ICD-10-CM | POA: Diagnosis not present

## 2021-04-24 DIAGNOSIS — K219 Gastro-esophageal reflux disease without esophagitis: Secondary | ICD-10-CM | POA: Diagnosis not present

## 2021-04-24 DIAGNOSIS — R52 Pain, unspecified: Secondary | ICD-10-CM | POA: Diagnosis not present

## 2021-04-24 DIAGNOSIS — R269 Unspecified abnormalities of gait and mobility: Secondary | ICD-10-CM | POA: Diagnosis not present

## 2021-04-24 DIAGNOSIS — J441 Chronic obstructive pulmonary disease with (acute) exacerbation: Secondary | ICD-10-CM | POA: Diagnosis not present

## 2021-04-24 DIAGNOSIS — I10 Essential (primary) hypertension: Secondary | ICD-10-CM | POA: Diagnosis not present

## 2021-04-24 DIAGNOSIS — I509 Heart failure, unspecified: Secondary | ICD-10-CM | POA: Diagnosis not present

## 2021-04-24 DIAGNOSIS — I739 Peripheral vascular disease, unspecified: Secondary | ICD-10-CM | POA: Diagnosis not present

## 2021-04-24 DIAGNOSIS — I2582 Chronic total occlusion of coronary artery: Secondary | ICD-10-CM | POA: Diagnosis not present

## 2021-04-25 DIAGNOSIS — R52 Pain, unspecified: Secondary | ICD-10-CM | POA: Diagnosis not present

## 2021-04-25 DIAGNOSIS — E785 Hyperlipidemia, unspecified: Secondary | ICD-10-CM | POA: Diagnosis not present

## 2021-04-25 DIAGNOSIS — I739 Peripheral vascular disease, unspecified: Secondary | ICD-10-CM | POA: Diagnosis not present

## 2021-04-25 DIAGNOSIS — J441 Chronic obstructive pulmonary disease with (acute) exacerbation: Secondary | ICD-10-CM | POA: Diagnosis not present

## 2021-04-25 DIAGNOSIS — I509 Heart failure, unspecified: Secondary | ICD-10-CM | POA: Diagnosis not present

## 2021-04-25 DIAGNOSIS — I10 Essential (primary) hypertension: Secondary | ICD-10-CM | POA: Diagnosis not present

## 2021-04-25 DIAGNOSIS — I2582 Chronic total occlusion of coronary artery: Secondary | ICD-10-CM | POA: Diagnosis not present

## 2021-04-25 DIAGNOSIS — E539 Vitamin B deficiency, unspecified: Secondary | ICD-10-CM | POA: Diagnosis not present

## 2021-04-25 DIAGNOSIS — K219 Gastro-esophageal reflux disease without esophagitis: Secondary | ICD-10-CM | POA: Diagnosis not present

## 2021-04-25 DIAGNOSIS — Z741 Need for assistance with personal care: Secondary | ICD-10-CM | POA: Diagnosis not present

## 2021-04-26 DIAGNOSIS — J441 Chronic obstructive pulmonary disease with (acute) exacerbation: Secondary | ICD-10-CM | POA: Diagnosis not present

## 2021-04-26 DIAGNOSIS — R52 Pain, unspecified: Secondary | ICD-10-CM | POA: Diagnosis not present

## 2021-04-26 DIAGNOSIS — E539 Vitamin B deficiency, unspecified: Secondary | ICD-10-CM | POA: Diagnosis not present

## 2021-04-26 DIAGNOSIS — I739 Peripheral vascular disease, unspecified: Secondary | ICD-10-CM | POA: Diagnosis not present

## 2021-04-26 DIAGNOSIS — E785 Hyperlipidemia, unspecified: Secondary | ICD-10-CM | POA: Diagnosis not present

## 2021-04-26 DIAGNOSIS — I10 Essential (primary) hypertension: Secondary | ICD-10-CM | POA: Diagnosis not present

## 2021-04-26 DIAGNOSIS — I509 Heart failure, unspecified: Secondary | ICD-10-CM | POA: Diagnosis not present

## 2021-04-26 DIAGNOSIS — Z741 Need for assistance with personal care: Secondary | ICD-10-CM | POA: Diagnosis not present

## 2021-04-26 DIAGNOSIS — I2582 Chronic total occlusion of coronary artery: Secondary | ICD-10-CM | POA: Diagnosis not present

## 2021-04-26 DIAGNOSIS — K219 Gastro-esophageal reflux disease without esophagitis: Secondary | ICD-10-CM | POA: Diagnosis not present

## 2021-04-27 DIAGNOSIS — J441 Chronic obstructive pulmonary disease with (acute) exacerbation: Secondary | ICD-10-CM | POA: Diagnosis not present

## 2021-04-27 DIAGNOSIS — Z741 Need for assistance with personal care: Secondary | ICD-10-CM | POA: Diagnosis not present

## 2021-04-27 DIAGNOSIS — I509 Heart failure, unspecified: Secondary | ICD-10-CM | POA: Diagnosis not present

## 2021-04-27 DIAGNOSIS — E785 Hyperlipidemia, unspecified: Secondary | ICD-10-CM | POA: Diagnosis not present

## 2021-04-27 DIAGNOSIS — K219 Gastro-esophageal reflux disease without esophagitis: Secondary | ICD-10-CM | POA: Diagnosis not present

## 2021-04-27 DIAGNOSIS — E539 Vitamin B deficiency, unspecified: Secondary | ICD-10-CM | POA: Diagnosis not present

## 2021-04-27 DIAGNOSIS — R52 Pain, unspecified: Secondary | ICD-10-CM | POA: Diagnosis not present

## 2021-04-27 DIAGNOSIS — I2582 Chronic total occlusion of coronary artery: Secondary | ICD-10-CM | POA: Diagnosis not present

## 2021-04-27 DIAGNOSIS — I739 Peripheral vascular disease, unspecified: Secondary | ICD-10-CM | POA: Diagnosis not present

## 2021-04-27 DIAGNOSIS — I10 Essential (primary) hypertension: Secondary | ICD-10-CM | POA: Diagnosis not present

## 2021-04-28 DIAGNOSIS — I2582 Chronic total occlusion of coronary artery: Secondary | ICD-10-CM | POA: Diagnosis not present

## 2021-04-28 DIAGNOSIS — K219 Gastro-esophageal reflux disease without esophagitis: Secondary | ICD-10-CM | POA: Diagnosis not present

## 2021-04-28 DIAGNOSIS — E539 Vitamin B deficiency, unspecified: Secondary | ICD-10-CM | POA: Diagnosis not present

## 2021-04-28 DIAGNOSIS — I509 Heart failure, unspecified: Secondary | ICD-10-CM | POA: Diagnosis not present

## 2021-04-28 DIAGNOSIS — R52 Pain, unspecified: Secondary | ICD-10-CM | POA: Diagnosis not present

## 2021-04-28 DIAGNOSIS — Z741 Need for assistance with personal care: Secondary | ICD-10-CM | POA: Diagnosis not present

## 2021-04-28 DIAGNOSIS — I739 Peripheral vascular disease, unspecified: Secondary | ICD-10-CM | POA: Diagnosis not present

## 2021-04-28 DIAGNOSIS — I10 Essential (primary) hypertension: Secondary | ICD-10-CM | POA: Diagnosis not present

## 2021-04-28 DIAGNOSIS — J441 Chronic obstructive pulmonary disease with (acute) exacerbation: Secondary | ICD-10-CM | POA: Diagnosis not present

## 2021-04-28 DIAGNOSIS — E785 Hyperlipidemia, unspecified: Secondary | ICD-10-CM | POA: Diagnosis not present

## 2021-05-01 DIAGNOSIS — I10 Essential (primary) hypertension: Secondary | ICD-10-CM | POA: Diagnosis not present

## 2021-05-01 DIAGNOSIS — R52 Pain, unspecified: Secondary | ICD-10-CM | POA: Diagnosis not present

## 2021-05-01 DIAGNOSIS — I509 Heart failure, unspecified: Secondary | ICD-10-CM | POA: Diagnosis not present

## 2021-05-01 DIAGNOSIS — Z741 Need for assistance with personal care: Secondary | ICD-10-CM | POA: Diagnosis not present

## 2021-05-01 DIAGNOSIS — I2582 Chronic total occlusion of coronary artery: Secondary | ICD-10-CM | POA: Diagnosis not present

## 2021-05-01 DIAGNOSIS — E785 Hyperlipidemia, unspecified: Secondary | ICD-10-CM | POA: Diagnosis not present

## 2021-05-01 DIAGNOSIS — J441 Chronic obstructive pulmonary disease with (acute) exacerbation: Secondary | ICD-10-CM | POA: Diagnosis not present

## 2021-05-01 DIAGNOSIS — K219 Gastro-esophageal reflux disease without esophagitis: Secondary | ICD-10-CM | POA: Diagnosis not present

## 2021-05-01 DIAGNOSIS — I739 Peripheral vascular disease, unspecified: Secondary | ICD-10-CM | POA: Diagnosis not present

## 2021-05-01 DIAGNOSIS — E539 Vitamin B deficiency, unspecified: Secondary | ICD-10-CM | POA: Diagnosis not present

## 2021-05-02 ENCOUNTER — Other Ambulatory Visit: Payer: Self-pay

## 2021-05-02 ENCOUNTER — Emergency Department
Admission: EM | Admit: 2021-05-02 | Discharge: 2021-05-03 | Disposition: A | Discharge status: Skilled Nursing Facility | Payer: Medicare Other | Attending: Emergency Medicine | Admitting: Emergency Medicine

## 2021-05-02 DIAGNOSIS — I739 Peripheral vascular disease, unspecified: Secondary | ICD-10-CM | POA: Diagnosis not present

## 2021-05-02 DIAGNOSIS — R440 Auditory hallucinations: Secondary | ICD-10-CM | POA: Insufficient documentation

## 2021-05-02 DIAGNOSIS — E539 Vitamin B deficiency, unspecified: Secondary | ICD-10-CM | POA: Diagnosis not present

## 2021-05-02 DIAGNOSIS — I5022 Chronic systolic (congestive) heart failure: Secondary | ICD-10-CM | POA: Diagnosis not present

## 2021-05-02 DIAGNOSIS — E785 Hyperlipidemia, unspecified: Secondary | ICD-10-CM | POA: Diagnosis not present

## 2021-05-02 DIAGNOSIS — M6259 Muscle wasting and atrophy, not elsewhere classified, multiple sites: Secondary | ICD-10-CM | POA: Diagnosis not present

## 2021-05-02 DIAGNOSIS — Z20822 Contact with and (suspected) exposure to covid-19: Secondary | ICD-10-CM | POA: Insufficient documentation

## 2021-05-02 DIAGNOSIS — I2582 Chronic total occlusion of coronary artery: Secondary | ICD-10-CM | POA: Diagnosis not present

## 2021-05-02 DIAGNOSIS — R5381 Other malaise: Secondary | ICD-10-CM | POA: Diagnosis not present

## 2021-05-02 DIAGNOSIS — R52 Pain, unspecified: Secondary | ICD-10-CM | POA: Diagnosis not present

## 2021-05-02 DIAGNOSIS — J441 Chronic obstructive pulmonary disease with (acute) exacerbation: Secondary | ICD-10-CM | POA: Diagnosis not present

## 2021-05-02 DIAGNOSIS — Z743 Need for continuous supervision: Secondary | ICD-10-CM | POA: Diagnosis not present

## 2021-05-02 DIAGNOSIS — R498 Other voice and resonance disorders: Secondary | ICD-10-CM | POA: Diagnosis not present

## 2021-05-02 DIAGNOSIS — I499 Cardiac arrhythmia, unspecified: Secondary | ICD-10-CM | POA: Diagnosis not present

## 2021-05-02 DIAGNOSIS — R001 Bradycardia, unspecified: Secondary | ICD-10-CM | POA: Diagnosis not present

## 2021-05-02 DIAGNOSIS — K219 Gastro-esophageal reflux disease without esophagitis: Secondary | ICD-10-CM | POA: Diagnosis not present

## 2021-05-02 DIAGNOSIS — I10 Essential (primary) hypertension: Secondary | ICD-10-CM | POA: Diagnosis not present

## 2021-05-02 LAB — RESP PANEL BY RT-PCR (FLU A&B, COVID) ARPGX2
Influenza A by PCR: NEGATIVE
Influenza B by PCR: NEGATIVE
SARS Coronavirus 2 by RT PCR: NEGATIVE

## 2021-05-02 LAB — COMPREHENSIVE METABOLIC PANEL
ALT: 10 U/L (ref 0–44)
AST: 16 U/L (ref 15–41)
Albumin: 3.1 g/dL — ABNORMAL LOW (ref 3.5–5.0)
Alkaline Phosphatase: 77 U/L (ref 38–126)
Anion gap: 5 (ref 5–15)
BUN: 24 mg/dL — ABNORMAL HIGH (ref 8–23)
CO2: 34 mmol/L — ABNORMAL HIGH (ref 22–32)
Calcium: 8.5 mg/dL — ABNORMAL LOW (ref 8.9–10.3)
Chloride: 100 mmol/L (ref 98–111)
Creatinine, Ser: 1.14 mg/dL (ref 0.61–1.24)
GFR, Estimated: >60 mL/min (ref >60)
Glucose, Bld: 100 mg/dL — ABNORMAL HIGH (ref 70–99)
Potassium: 3.9 mmol/L (ref 3.5–5.1)
Sodium: 139 mmol/L (ref 135–145)
Total Bilirubin: 0.5 mg/dL (ref 0.3–1.2)
Total Protein: 6.3 g/dL — ABNORMAL LOW (ref 6.5–8.1)

## 2021-05-02 LAB — ETHANOL: Alcohol, Ethyl (B): <10 mg/dL (ref <10)

## 2021-05-02 NOTE — ED Provider Notes (Signed)
? ?[facility] ?Provider Note ? ? ? Event Date/Time  ? First MD Initiated Contact with Patient 05/02/21 2257   ?  (approximate) ? ? ?History  ? ?Mental Health Problem ? ?Level 5 caveat: History is limited by the patient's chronic psychiatric illness ? ?HPI ? ?Anthony Chambers is a 76 y.o. 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  He also reportedly has a history of schizoaffective disorder.  [facility] for evaluation he said that he has been hearing voices for years that frequently tell him to do things like cut himself.  They have recently gotten worse.  Of note, he admits that he started refusing his Zyprexa recently, stating that he does not think it makes any difference.  However his voices got worse after he stopped the Zyprexa. ? ?He denies any acute complaints or concerns.  He said he does not feel short of breath and he has no pain including chest pain nor abdominal pain.  He says that he has been taking his other medication, a claim that was reportedly supported by the nursing facility to paramedics. ? ?He states that he is concerned he may harm himself because of the voices telling him to do so. ?  ? ? ?Physical Exam  ? ?Triage Vital Signs: ?ED Triage Vitals  ?Enc Vitals Group  ?   BP 05/02/21 2234 (!) 157/64  ?   Pulse Rate 05/02/21 2234 (!) 56  ?   Resp 05/02/21 2234 18  ?   Temp 05/02/21 2234 99 ?F (37.2 ?C)  ?   Temp Source 05/02/21 2234 Oral  ?   SpO2 05/02/21 2231 97 %  ?   Weight 05/02/21 2236 110 kg (242 lb 8.1 oz)  ?   Height 05/02/21 2236 1.88 m (6\' 2" )  ?   Head Circumference --   ?   [facility] --   ?   Pain Score 05/02/21 2235 0  ?   Pain Loc --   ?   Pain Edu? --   ?   Excl. in GC? --   ? ? ?Most recent vital signs: ?Vitals:  ? 05/02/21 2231 05/02/21 2234  ?BP:  (!) 157/64  ?Pulse:  (!) 56  ?Resp:  18  ?Temp:  99 ?F (37.2 ?C)  ?SpO2: 97% 100%  ? ? ? ?General: Awake, no distress.  ?CV:  Good peripheral  perfusion.  Normal heart sounds. ?Resp:  Normal effort.  Lungs are clear to auscultation on his supplemental oxygen. ?Abd:  No distention.  No tenderness to palpation. ?Other:  Patient reports hearing auditory command hallucinations telling him to cut himself.  He states he does not want to kill himself nor harm anyone else.  He has no insight or judgment regarding the refusal to take his Zyprexa and the effect this has on his hallucinations. ? ? ?ED Results / Procedures / Treatments  ? ?Labs ?(all labs ordered are listed, but only abnormal results are displayed) ?Labs Reviewed  ?COMPREHENSIVE METABOLIC PANEL - Abnormal; Notable for the following components:  ?    Result Value  ? CO2 34 (*)   ? Glucose, Bld 100 (*)   ? BUN 24 (*)   ? Calcium 8.5 (*)   ? Total Protein 6.3 (*)   ? Albumin 3.1 (*)   ? All other components within normal limits  ?SALICYLATE LEVEL - Abnormal; Notable for the following components:  ? Salicylate Lvl <  7.0 (*)   ? All other components within normal limits  ?RESP PANEL BY RT-PCR (FLU A&B, COVID) ARPGX2  ?ACETAMINOPHEN LEVEL  ?ETHANOL  ?URINE DRUG SCREEN, QUALITATIVE ([facility] ONLY)  ?URINALYSIS, ROUTINE W REFLEX MICROSCOPIC  ?CBC  ? ? ? ?EKG ? ?ED ECG REPORT ?ILoleta Rose, the attending physician, personally viewed and interpreted this ECG. ? ?Date: 05/02/2021 ?EKG Time: 23: 01 ?Rate: 53 ?Rhythm: sinus bradycardia ?QRS Axis: normal ?Intervals: RBBB ?ST/T Wave abnormalities: Non-specific ST segment / T-wave changes, but no clear evidence of acute ischemia. ?Narrative Interpretation: no definitive evidence of acute ischemia; does not meet STEMI criteria. ? ? ? ?PROCEDURES: ? ?Critical Care performed: No ? ?Procedures ? ? ?MEDICATIONS ORDERED IN ED: ?Medications - No data to display ? ? ?IMPRESSION / MDM / ASSESSMENT AND PLAN / ED COURSE  ?I reviewed the triage vital signs and the nursing notes. ?             ?               ? ?Differential diagnosis includes, but is not limited to, medication  noncompliance, acute on chronic psychiatric issue, metabolic or electrolyte abnormality acute infection. ? ?The patient has no medical complaints or concerns at this time.  This seems to be an issue of this; he is not taking an antipsychotic and now his auditory hallucinations are worse than usual.  He does not seem to represent an immediate danger to himself but he should be back on his medication.  However I tried to explain this to him and he is resistant. ? ?Given his chronic medical issues and psychiatric complaint, lab work was ordered initially including CMP, CBC, acetaminophen level, salicylate level, resp viral panel, ethanol. ? ?I reviewed the results and his CMP is essentially normal, viral panel is negative, ethanol is negative, acetaminophen level is 11, salicylate level is negative.  CBC not yet resulted, and patient has not provided a urine specimen. ? ?I believe the patient can be discharged back to his nursing facility to follow up as an outpatient, but I consulted psychiatry for their input and contacted Elenore Paddy directly by [employer] secure chat text. She will evaluate the patient and offer recommendations. ? ? ?Clinical Course as of 05/03/21 0058  ?Wed May 03, 2021  ?66 Annice Pih with psychiatry consulted on the patient in the emergency department.  She agreed that the patient does not have an acute issue that requires hospitalization at this time.  This seems to be an issue of chronic psychiatric illness and medication noncompliance, but staying in the hospital will not help and we both agree that he does not represent an immediate danger to himself.  She recommends discharge back to his facility with close outpatient follow-up and I agree.  I prepared AVS with this information in mind and he will be discharged [facility] to [facility]. [CF]  ?  ?Clinical Course User Index ?[CF] Loleta Rose, MD  ? ? ? ?FINAL CLINICAL IMPRESSION(S) / ED DIAGNOSES  ? ?Final diagnoses:  ?Auditory hallucinations   ? ? ? ?Rx / DC Orders  ? ?ED Discharge Orders   ? ? None  ? ?  ? ? ? ?Note:  This document was prepared using Dragon voice recognition software and may include unintentional dictation errors. ?  ?Loleta Rose, MD ?05/03/21 [phone] ? ?

## 2021-05-02 NOTE — ED Triage Notes (Signed)
Patient arrived by [facility] from [facility] due to patient hearing voices. Patient states this is common for him due to him being schizoaffective. Patient did take all medications except his zyprexa. Patient did refuse. Patient is alert and oriented x 4. Patient is on 4liters of O2. Patient currently denies SI/HI/AVH.  ?

## 2021-05-03 DIAGNOSIS — R5381 Other malaise: Secondary | ICD-10-CM | POA: Diagnosis not present

## 2021-05-03 DIAGNOSIS — Z91199 Patient's noncompliance with other medical treatment and regimen due to unspecified reason: Secondary | ICD-10-CM | POA: Diagnosis not present

## 2021-05-03 DIAGNOSIS — I10 Essential (primary) hypertension: Secondary | ICD-10-CM | POA: Diagnosis not present

## 2021-05-03 DIAGNOSIS — Z743 Need for continuous supervision: Secondary | ICD-10-CM | POA: Diagnosis not present

## 2021-05-03 DIAGNOSIS — R498 Other voice and resonance disorders: Secondary | ICD-10-CM | POA: Diagnosis not present

## 2021-05-03 DIAGNOSIS — R52 Pain, unspecified: Secondary | ICD-10-CM | POA: Diagnosis not present

## 2021-05-03 DIAGNOSIS — I739 Peripheral vascular disease, unspecified: Secondary | ICD-10-CM | POA: Diagnosis not present

## 2021-05-03 DIAGNOSIS — E785 Hyperlipidemia, unspecified: Secondary | ICD-10-CM | POA: Diagnosis not present

## 2021-05-03 DIAGNOSIS — E539 Vitamin B deficiency, unspecified: Secondary | ICD-10-CM | POA: Diagnosis not present

## 2021-05-03 DIAGNOSIS — K219 Gastro-esophageal reflux disease without esophagitis: Secondary | ICD-10-CM | POA: Diagnosis not present

## 2021-05-03 DIAGNOSIS — I2582 Chronic total occlusion of coronary artery: Secondary | ICD-10-CM | POA: Diagnosis not present

## 2021-05-03 DIAGNOSIS — J441 Chronic obstructive pulmonary disease with (acute) exacerbation: Secondary | ICD-10-CM | POA: Diagnosis not present

## 2021-05-03 DIAGNOSIS — Z7401 Bed confinement status: Secondary | ICD-10-CM | POA: Diagnosis not present

## 2021-05-03 DIAGNOSIS — R69 Illness, unspecified: Secondary | ICD-10-CM | POA: Diagnosis not present

## 2021-05-03 DIAGNOSIS — M6259 Muscle wasting and atrophy, not elsewhere classified, multiple sites: Secondary | ICD-10-CM | POA: Diagnosis not present

## 2021-05-03 LAB — ACETAMINOPHEN LEVEL: Acetaminophen (Tylenol), Serum: 11 ug/mL (ref 10–30)

## 2021-05-03 LAB — SALICYLATE LEVEL: Salicylate Lvl: <7 mg/dL — ABNORMAL LOW (ref 7.0–30.0)

## 2021-05-03 NOTE — Discharge Instructions (Signed)
You have been seen in the Emergency Department (ED) today for a psychiatric complaint.  You have been evaluated by psychiatry and we believe you are safe to be discharged from the hospital.  There is nothing new or different that can be done in the hospital.  You need to take all of your medications, including your Zyprexa. ? ?Follow up with your doctor and/or therapist as soon as possible regarding today's ED visit.   Please follow up any other recommendations and clinic appointments provided by the psychiatry team that saw you in the Emergency Department. ?

## 2021-05-03 NOTE — ED Notes (Signed)
[facility] Called for transport back to [facility]  ?

## 2021-05-04 DIAGNOSIS — J441 Chronic obstructive pulmonary disease with (acute) exacerbation: Secondary | ICD-10-CM | POA: Diagnosis not present

## 2021-05-04 DIAGNOSIS — E539 Vitamin B deficiency, unspecified: Secondary | ICD-10-CM | POA: Diagnosis not present

## 2021-05-04 DIAGNOSIS — I739 Peripheral vascular disease, unspecified: Secondary | ICD-10-CM | POA: Diagnosis not present

## 2021-05-04 DIAGNOSIS — I2582 Chronic total occlusion of coronary artery: Secondary | ICD-10-CM | POA: Diagnosis not present

## 2021-05-04 DIAGNOSIS — R52 Pain, unspecified: Secondary | ICD-10-CM | POA: Diagnosis not present

## 2021-05-04 DIAGNOSIS — I10 Essential (primary) hypertension: Secondary | ICD-10-CM | POA: Diagnosis not present

## 2021-05-04 DIAGNOSIS — R498 Other voice and resonance disorders: Secondary | ICD-10-CM | POA: Diagnosis not present

## 2021-05-04 DIAGNOSIS — M6259 Muscle wasting and atrophy, not elsewhere classified, multiple sites: Secondary | ICD-10-CM | POA: Diagnosis not present

## 2021-05-04 DIAGNOSIS — K219 Gastro-esophageal reflux disease without esophagitis: Secondary | ICD-10-CM | POA: Diagnosis not present

## 2021-05-04 DIAGNOSIS — E785 Hyperlipidemia, unspecified: Secondary | ICD-10-CM | POA: Diagnosis not present

## 2021-05-05 DIAGNOSIS — J441 Chronic obstructive pulmonary disease with (acute) exacerbation: Secondary | ICD-10-CM | POA: Diagnosis not present

## 2021-05-05 DIAGNOSIS — I10 Essential (primary) hypertension: Secondary | ICD-10-CM | POA: Diagnosis not present

## 2021-05-05 DIAGNOSIS — K219 Gastro-esophageal reflux disease without esophagitis: Secondary | ICD-10-CM | POA: Diagnosis not present

## 2021-05-05 DIAGNOSIS — R498 Other voice and resonance disorders: Secondary | ICD-10-CM | POA: Diagnosis not present

## 2021-05-05 DIAGNOSIS — E785 Hyperlipidemia, unspecified: Secondary | ICD-10-CM | POA: Diagnosis not present

## 2021-05-05 DIAGNOSIS — R52 Pain, unspecified: Secondary | ICD-10-CM | POA: Diagnosis not present

## 2021-05-05 DIAGNOSIS — I2582 Chronic total occlusion of coronary artery: Secondary | ICD-10-CM | POA: Diagnosis not present

## 2021-05-05 DIAGNOSIS — M6259 Muscle wasting and atrophy, not elsewhere classified, multiple sites: Secondary | ICD-10-CM | POA: Diagnosis not present

## 2021-05-05 DIAGNOSIS — I739 Peripheral vascular disease, unspecified: Secondary | ICD-10-CM | POA: Diagnosis not present

## 2021-05-05 DIAGNOSIS — E539 Vitamin B deficiency, unspecified: Secondary | ICD-10-CM | POA: Diagnosis not present

## 2021-05-08 DIAGNOSIS — I10 Essential (primary) hypertension: Secondary | ICD-10-CM | POA: Diagnosis not present

## 2021-05-08 DIAGNOSIS — I5022 Chronic systolic (congestive) heart failure: Secondary | ICD-10-CM | POA: Diagnosis not present

## 2021-05-08 DIAGNOSIS — E785 Hyperlipidemia, unspecified: Secondary | ICD-10-CM | POA: Diagnosis not present

## 2021-05-08 DIAGNOSIS — E539 Vitamin B deficiency, unspecified: Secondary | ICD-10-CM | POA: Diagnosis not present

## 2021-05-08 DIAGNOSIS — R52 Pain, unspecified: Secondary | ICD-10-CM | POA: Diagnosis not present

## 2021-05-08 DIAGNOSIS — I2582 Chronic total occlusion of coronary artery: Secondary | ICD-10-CM | POA: Diagnosis not present

## 2021-05-08 DIAGNOSIS — K219 Gastro-esophageal reflux disease without esophagitis: Secondary | ICD-10-CM | POA: Diagnosis not present

## 2021-05-08 DIAGNOSIS — M6259 Muscle wasting and atrophy, not elsewhere classified, multiple sites: Secondary | ICD-10-CM | POA: Diagnosis not present

## 2021-05-08 DIAGNOSIS — I739 Peripheral vascular disease, unspecified: Secondary | ICD-10-CM | POA: Diagnosis not present

## 2021-05-08 DIAGNOSIS — J441 Chronic obstructive pulmonary disease with (acute) exacerbation: Secondary | ICD-10-CM | POA: Diagnosis not present

## 2021-05-09 DIAGNOSIS — I739 Peripheral vascular disease, unspecified: Secondary | ICD-10-CM | POA: Diagnosis not present

## 2021-05-09 DIAGNOSIS — E539 Vitamin B deficiency, unspecified: Secondary | ICD-10-CM | POA: Diagnosis not present

## 2021-05-09 DIAGNOSIS — J441 Chronic obstructive pulmonary disease with (acute) exacerbation: Secondary | ICD-10-CM | POA: Diagnosis not present

## 2021-05-09 DIAGNOSIS — M6281 Muscle weakness (generalized): Secondary | ICD-10-CM | POA: Diagnosis not present

## 2021-05-09 DIAGNOSIS — R52 Pain, unspecified: Secondary | ICD-10-CM | POA: Diagnosis not present

## 2021-05-09 DIAGNOSIS — I5022 Chronic systolic (congestive) heart failure: Secondary | ICD-10-CM | POA: Diagnosis not present

## 2021-05-09 DIAGNOSIS — M6259 Muscle wasting and atrophy, not elsewhere classified, multiple sites: Secondary | ICD-10-CM | POA: Diagnosis not present

## 2021-05-09 DIAGNOSIS — E785 Hyperlipidemia, unspecified: Secondary | ICD-10-CM | POA: Diagnosis not present

## 2021-05-09 DIAGNOSIS — F32A Depression, unspecified: Secondary | ICD-10-CM | POA: Diagnosis not present

## 2021-05-09 DIAGNOSIS — I2582 Chronic total occlusion of coronary artery: Secondary | ICD-10-CM | POA: Diagnosis not present

## 2021-05-09 DIAGNOSIS — K219 Gastro-esophageal reflux disease without esophagitis: Secondary | ICD-10-CM | POA: Diagnosis not present

## 2021-05-09 DIAGNOSIS — I10 Essential (primary) hypertension: Secondary | ICD-10-CM | POA: Diagnosis not present

## 2021-05-09 DIAGNOSIS — J9611 Chronic respiratory failure with hypoxia: Secondary | ICD-10-CM | POA: Diagnosis not present

## 2021-05-10 DIAGNOSIS — J441 Chronic obstructive pulmonary disease with (acute) exacerbation: Secondary | ICD-10-CM | POA: Diagnosis not present

## 2021-05-10 DIAGNOSIS — M6259 Muscle wasting and atrophy, not elsewhere classified, multiple sites: Secondary | ICD-10-CM | POA: Diagnosis not present

## 2021-05-10 DIAGNOSIS — E539 Vitamin B deficiency, unspecified: Secondary | ICD-10-CM | POA: Diagnosis not present

## 2021-05-10 DIAGNOSIS — E785 Hyperlipidemia, unspecified: Secondary | ICD-10-CM | POA: Diagnosis not present

## 2021-05-10 DIAGNOSIS — I739 Peripheral vascular disease, unspecified: Secondary | ICD-10-CM | POA: Diagnosis not present

## 2021-05-10 DIAGNOSIS — K219 Gastro-esophageal reflux disease without esophagitis: Secondary | ICD-10-CM | POA: Diagnosis not present

## 2021-05-10 DIAGNOSIS — R52 Pain, unspecified: Secondary | ICD-10-CM | POA: Diagnosis not present

## 2021-05-10 DIAGNOSIS — I10 Essential (primary) hypertension: Secondary | ICD-10-CM | POA: Diagnosis not present

## 2021-05-10 DIAGNOSIS — I2582 Chronic total occlusion of coronary artery: Secondary | ICD-10-CM | POA: Diagnosis not present

## 2021-05-10 DIAGNOSIS — I5022 Chronic systolic (congestive) heart failure: Secondary | ICD-10-CM | POA: Diagnosis not present

## 2021-05-11 DIAGNOSIS — I2582 Chronic total occlusion of coronary artery: Secondary | ICD-10-CM | POA: Diagnosis not present

## 2021-05-11 DIAGNOSIS — E785 Hyperlipidemia, unspecified: Secondary | ICD-10-CM | POA: Diagnosis not present

## 2021-05-11 DIAGNOSIS — E539 Vitamin B deficiency, unspecified: Secondary | ICD-10-CM | POA: Diagnosis not present

## 2021-05-11 DIAGNOSIS — I5022 Chronic systolic (congestive) heart failure: Secondary | ICD-10-CM | POA: Diagnosis not present

## 2021-05-11 DIAGNOSIS — M6259 Muscle wasting and atrophy, not elsewhere classified, multiple sites: Secondary | ICD-10-CM | POA: Diagnosis not present

## 2021-05-11 DIAGNOSIS — I739 Peripheral vascular disease, unspecified: Secondary | ICD-10-CM | POA: Diagnosis not present

## 2021-05-11 DIAGNOSIS — K219 Gastro-esophageal reflux disease without esophagitis: Secondary | ICD-10-CM | POA: Diagnosis not present

## 2021-05-11 DIAGNOSIS — R52 Pain, unspecified: Secondary | ICD-10-CM | POA: Diagnosis not present

## 2021-05-11 DIAGNOSIS — J441 Chronic obstructive pulmonary disease with (acute) exacerbation: Secondary | ICD-10-CM | POA: Diagnosis not present

## 2021-05-11 DIAGNOSIS — I10 Essential (primary) hypertension: Secondary | ICD-10-CM | POA: Diagnosis not present

## 2021-05-12 DIAGNOSIS — I5022 Chronic systolic (congestive) heart failure: Secondary | ICD-10-CM | POA: Diagnosis not present

## 2021-05-12 DIAGNOSIS — I10 Essential (primary) hypertension: Secondary | ICD-10-CM | POA: Diagnosis not present

## 2021-05-12 DIAGNOSIS — E539 Vitamin B deficiency, unspecified: Secondary | ICD-10-CM | POA: Diagnosis not present

## 2021-05-12 DIAGNOSIS — K219 Gastro-esophageal reflux disease without esophagitis: Secondary | ICD-10-CM | POA: Diagnosis not present

## 2021-05-12 DIAGNOSIS — I739 Peripheral vascular disease, unspecified: Secondary | ICD-10-CM | POA: Diagnosis not present

## 2021-05-12 DIAGNOSIS — E785 Hyperlipidemia, unspecified: Secondary | ICD-10-CM | POA: Diagnosis not present

## 2021-05-12 DIAGNOSIS — I2582 Chronic total occlusion of coronary artery: Secondary | ICD-10-CM | POA: Diagnosis not present

## 2021-05-12 DIAGNOSIS — M6259 Muscle wasting and atrophy, not elsewhere classified, multiple sites: Secondary | ICD-10-CM | POA: Diagnosis not present

## 2021-05-12 DIAGNOSIS — J441 Chronic obstructive pulmonary disease with (acute) exacerbation: Secondary | ICD-10-CM | POA: Diagnosis not present

## 2021-05-12 DIAGNOSIS — R52 Pain, unspecified: Secondary | ICD-10-CM | POA: Diagnosis not present

## 2021-05-15 ENCOUNTER — Non-Acute Institutional Stay: Payer: Medicare Other | Admitting: Primary Care

## 2021-05-15 DIAGNOSIS — I739 Peripheral vascular disease, unspecified: Secondary | ICD-10-CM | POA: Diagnosis not present

## 2021-05-15 DIAGNOSIS — Z515 Encounter for palliative care: Secondary | ICD-10-CM

## 2021-05-15 DIAGNOSIS — M6281 Muscle weakness (generalized): Secondary | ICD-10-CM | POA: Diagnosis not present

## 2021-05-15 DIAGNOSIS — R52 Pain, unspecified: Secondary | ICD-10-CM | POA: Diagnosis not present

## 2021-05-15 DIAGNOSIS — E539 Vitamin B deficiency, unspecified: Secondary | ICD-10-CM | POA: Diagnosis not present

## 2021-05-15 DIAGNOSIS — K219 Gastro-esophageal reflux disease without esophagitis: Secondary | ICD-10-CM | POA: Diagnosis not present

## 2021-05-15 DIAGNOSIS — M6259 Muscle wasting and atrophy, not elsewhere classified, multiple sites: Secondary | ICD-10-CM | POA: Diagnosis not present

## 2021-05-15 DIAGNOSIS — J441 Chronic obstructive pulmonary disease with (acute) exacerbation: Secondary | ICD-10-CM | POA: Diagnosis not present

## 2021-05-15 DIAGNOSIS — I2582 Chronic total occlusion of coronary artery: Secondary | ICD-10-CM | POA: Diagnosis not present

## 2021-05-15 DIAGNOSIS — E785 Hyperlipidemia, unspecified: Secondary | ICD-10-CM | POA: Diagnosis not present

## 2021-05-15 DIAGNOSIS — I5022 Chronic systolic (congestive) heart failure: Secondary | ICD-10-CM | POA: Diagnosis not present

## 2021-05-15 DIAGNOSIS — I10 Essential (primary) hypertension: Secondary | ICD-10-CM | POA: Diagnosis not present

## 2021-05-15 DIAGNOSIS — J449 Chronic obstructive pulmonary disease, unspecified: Secondary | ICD-10-CM | POA: Diagnosis not present

## 2021-05-15 NOTE — Progress Notes (Signed)
? ? ?[facility] ?Community Palliative Care Consult Note ?Telephone: [phone]  ?Fax: [phone]  ? ? ?Date of encounter: 05/15/21 ?2:21 PM ?PATIENT NAME: Anthony Chambers ?[address] ?Anthony Chambers   ?[phone] (home)  ?DOB: 11-17-1945 ?MRN: [id number] ?PRIMARY CARE PROVIDER:    ?Anthony Koyanagi, MD,  ?[place] [address] ?[phone] ? ?REFERRING PROVIDER:   ?Anthony Koyanagi, MD ?[place],  [place] [address] ?[phone] ? ?RESPONSIBLE PARTY:    ?Contact Information   ? ? Name Relation Home Work Mobile  ? Anthony Chambers Sister [phone]   ? Anthony Chambers Relative   [phone]  ? Anthony Chambers [phone]    ? Anthony Chambers [phone]   ? ?  ? ? ? ?I met face to face with [facility] in [facility]. Palliative Care was asked to follow this patient by consultation request of  Anthony Koyanagi, MD to address advance care planning and complex medical decision making. This is a follow up visit. ? ?                                 ASSESSMENT AND PLAN / RECOMMENDATIONS:  ? ?Advance Care Planning/Goals of Care: Goals include to maximize quality of life and symptom management. Patient/health care surrogate gave his/her permission to discuss.Our advance care planning conversation included a discussion about:    ?Exploration of personal, cultural or spiritual beliefs that might influence medical decisions  ?Identification of a healthcare agent - sister ? ?CODE STATUS: FULL ? ? ?I reviewed  a MOST form today. The patient and family outlined their wishes for the following treatment decisions: ? ?Cardiopulmonary Resuscitation: Attempt Resuscitation (CPR)  ?Medical Interventions: Full Scope of Treatment: Use intubation, advanced airway interventions, mechanical ventilation, cardioversion as indicated, medical treatment, IV fluids, etc, also provide comfort measures. Transfer to the hospital if indicated  ?Antibiotics: Determine  use of limitation of antibiotics when infection occurs  ?IV Fluids: IV fluids for a defined trial period  ?Feeding Tube: Feeding tube for a defined trial period  ? ? ?Symptom Management/Plan: ? ?Recent ED trip for increasing hallucinations, probable connection to his refusal of xyprexa. These voices were telling him to harm himself and he did not want to. He is apparently taking this medication again per [facility], and appears to be in good spirits today. HE is looking forward to bingo today, and states he is eating well. Weights are fluctuating around 220 +/- 5 lbs but not below that. His PR is his sister but he does not have much contact with her. At this interview he appears to be stable and not in mental or physical distress.  ? ?Follow up Palliative Care Visit: Palliative care will continue to follow for complex medical decision making, advance care planning, and clarification of goals. Return 12 weeks or prn. ? ?I spent 15 minutes providing this consultation. More than 50% of the time in this consultation was spent in counseling and care coordination. ? ?PPS: 40% ? ?HOSPICE ELIGIBILITY/DIAGNOSIS: no ? ?Chief Complaint: debility ? ?HISTORY OF PRESENT ILLNESS:  Anthony Chambers is a 76 y.o. year old male  with COPD, CHF, cognitive impairment . Patient seen today to review palliative care needs to include medical decision making and advance care planning as appropriate.  ? ?History obtained from review of EMR, discussion with primary team, and interview with family, facility staff/caregiver and/or Anthony Chambers.  ?  I reviewed available labs, medications, imaging, studies and related documents from the EMR.  Records reviewed and summarized above.  ? ?ROS ? ?General: NAD ?ENMT: denies dysphagia ?Pulmonary: denies cough, denies increased SOB, reports oxygen use at hs. ?Abdomen: endorses good appetite, denies constipation, endorses continence of bowel ?GU: denies dysuria, endorses continence of urine ?MSK:  denies   increased weakness,  no falls reported ?Skin: denies rashes or wounds ?Neurological: denies pain, denies insomnia ?Psych: Endorses recent increased hallucinations. H/o schizoaffective disorder ? ?Physical Exam: ?Current and past weights:220 lbs, 5 lb variation consistent ?Constitutional: NAD ?General: frail appearing, WNWD ?EYES: anicteric sclera, lids intact, no discharge  ?ENMT: intact hearing, oral mucous membranes moist ?CV: 2+ LE edema ?Pulmonary: LCTA, no increased work of breathing, no cough, room air ?Abdomen: intake 75-100%, no ascites ?MSK: mild  sarcopenia, moves all extremities, can transfer, non ambulatory ?Skin: warm and dry, no rashes or wounds on visible skin ?Neuro:  + generalized weakness,  + cognitive impairment, non-anxious affect on my exam ? ? ?Thank you for the opportunity to participate in the care of Anthony Chambers.  The palliative care team will continue to follow. Please call our office at [phone] if we can be of additional assistance.  ? ?Anthony Coop, NP DNP, AGPCNP-BC ? ?COVID-19 PATIENT SCREENING TOOL ?Asked and negative response unless otherwise noted:  ? ?Have you had symptoms of covid, tested positive or been in contact with someone with symptoms/positive test in the past 5-10 days?  ? ?

## 2021-05-16 DIAGNOSIS — L603 Nail dystrophy: Secondary | ICD-10-CM | POA: Diagnosis not present

## 2021-05-16 DIAGNOSIS — I739 Peripheral vascular disease, unspecified: Secondary | ICD-10-CM | POA: Diagnosis not present

## 2021-05-26 DIAGNOSIS — H524 Presbyopia: Secondary | ICD-10-CM | POA: Diagnosis not present

## 2021-05-26 DIAGNOSIS — H25813 Combined forms of age-related cataract, bilateral: Secondary | ICD-10-CM | POA: Diagnosis not present

## 2021-07-13 DIAGNOSIS — J9611 Chronic respiratory failure with hypoxia: Secondary | ICD-10-CM | POA: Diagnosis not present

## 2021-07-13 DIAGNOSIS — M6281 Muscle weakness (generalized): Secondary | ICD-10-CM | POA: Diagnosis not present

## 2021-07-13 DIAGNOSIS — F32A Depression, unspecified: Secondary | ICD-10-CM | POA: Diagnosis not present

## 2021-07-17 DIAGNOSIS — I10 Essential (primary) hypertension: Secondary | ICD-10-CM | POA: Diagnosis not present

## 2021-07-18 DIAGNOSIS — J441 Chronic obstructive pulmonary disease with (acute) exacerbation: Secondary | ICD-10-CM | POA: Diagnosis not present

## 2021-07-18 DIAGNOSIS — I739 Peripheral vascular disease, unspecified: Secondary | ICD-10-CM | POA: Diagnosis not present

## 2021-07-18 DIAGNOSIS — I509 Heart failure, unspecified: Secondary | ICD-10-CM | POA: Diagnosis not present

## 2021-07-18 DIAGNOSIS — I2582 Chronic total occlusion of coronary artery: Secondary | ICD-10-CM | POA: Diagnosis not present

## 2021-07-18 DIAGNOSIS — I10 Essential (primary) hypertension: Secondary | ICD-10-CM | POA: Diagnosis not present

## 2021-07-18 DIAGNOSIS — R52 Pain, unspecified: Secondary | ICD-10-CM | POA: Diagnosis not present

## 2021-07-18 DIAGNOSIS — R5383 Other fatigue: Secondary | ICD-10-CM | POA: Diagnosis not present

## 2021-07-18 DIAGNOSIS — E785 Hyperlipidemia, unspecified: Secondary | ICD-10-CM | POA: Diagnosis not present

## 2021-07-18 DIAGNOSIS — E539 Vitamin B deficiency, unspecified: Secondary | ICD-10-CM | POA: Diagnosis not present

## 2021-07-18 DIAGNOSIS — K219 Gastro-esophageal reflux disease without esophagitis: Secondary | ICD-10-CM | POA: Diagnosis not present

## 2021-07-19 DIAGNOSIS — R52 Pain, unspecified: Secondary | ICD-10-CM | POA: Diagnosis not present

## 2021-07-19 DIAGNOSIS — I10 Essential (primary) hypertension: Secondary | ICD-10-CM | POA: Diagnosis not present

## 2021-07-19 DIAGNOSIS — I2582 Chronic total occlusion of coronary artery: Secondary | ICD-10-CM | POA: Diagnosis not present

## 2021-07-19 DIAGNOSIS — I509 Heart failure, unspecified: Secondary | ICD-10-CM | POA: Diagnosis not present

## 2021-07-19 DIAGNOSIS — K219 Gastro-esophageal reflux disease without esophagitis: Secondary | ICD-10-CM | POA: Diagnosis not present

## 2021-07-19 DIAGNOSIS — R5383 Other fatigue: Secondary | ICD-10-CM | POA: Diagnosis not present

## 2021-07-19 DIAGNOSIS — E785 Hyperlipidemia, unspecified: Secondary | ICD-10-CM | POA: Diagnosis not present

## 2021-07-19 DIAGNOSIS — J441 Chronic obstructive pulmonary disease with (acute) exacerbation: Secondary | ICD-10-CM | POA: Diagnosis not present

## 2021-07-19 DIAGNOSIS — E539 Vitamin B deficiency, unspecified: Secondary | ICD-10-CM | POA: Diagnosis not present

## 2021-07-19 DIAGNOSIS — I739 Peripheral vascular disease, unspecified: Secondary | ICD-10-CM | POA: Diagnosis not present

## 2021-07-20 DIAGNOSIS — I10 Essential (primary) hypertension: Secondary | ICD-10-CM | POA: Diagnosis not present

## 2021-07-20 DIAGNOSIS — E539 Vitamin B deficiency, unspecified: Secondary | ICD-10-CM | POA: Diagnosis not present

## 2021-07-20 DIAGNOSIS — E785 Hyperlipidemia, unspecified: Secondary | ICD-10-CM | POA: Diagnosis not present

## 2021-07-20 DIAGNOSIS — I739 Peripheral vascular disease, unspecified: Secondary | ICD-10-CM | POA: Diagnosis not present

## 2021-07-20 DIAGNOSIS — I2582 Chronic total occlusion of coronary artery: Secondary | ICD-10-CM | POA: Diagnosis not present

## 2021-07-20 DIAGNOSIS — I509 Heart failure, unspecified: Secondary | ICD-10-CM | POA: Diagnosis not present

## 2021-07-20 DIAGNOSIS — K219 Gastro-esophageal reflux disease without esophagitis: Secondary | ICD-10-CM | POA: Diagnosis not present

## 2021-07-20 DIAGNOSIS — J441 Chronic obstructive pulmonary disease with (acute) exacerbation: Secondary | ICD-10-CM | POA: Diagnosis not present

## 2021-07-20 DIAGNOSIS — R52 Pain, unspecified: Secondary | ICD-10-CM | POA: Diagnosis not present

## 2021-07-20 DIAGNOSIS — R5383 Other fatigue: Secondary | ICD-10-CM | POA: Diagnosis not present

## 2021-07-21 DIAGNOSIS — E785 Hyperlipidemia, unspecified: Secondary | ICD-10-CM | POA: Diagnosis not present

## 2021-07-21 DIAGNOSIS — K219 Gastro-esophageal reflux disease without esophagitis: Secondary | ICD-10-CM | POA: Diagnosis not present

## 2021-07-21 DIAGNOSIS — I509 Heart failure, unspecified: Secondary | ICD-10-CM | POA: Diagnosis not present

## 2021-07-21 DIAGNOSIS — I739 Peripheral vascular disease, unspecified: Secondary | ICD-10-CM | POA: Diagnosis not present

## 2021-07-21 DIAGNOSIS — E539 Vitamin B deficiency, unspecified: Secondary | ICD-10-CM | POA: Diagnosis not present

## 2021-07-21 DIAGNOSIS — R5383 Other fatigue: Secondary | ICD-10-CM | POA: Diagnosis not present

## 2021-07-21 DIAGNOSIS — I10 Essential (primary) hypertension: Secondary | ICD-10-CM | POA: Diagnosis not present

## 2021-07-21 DIAGNOSIS — R52 Pain, unspecified: Secondary | ICD-10-CM | POA: Diagnosis not present

## 2021-07-21 DIAGNOSIS — J441 Chronic obstructive pulmonary disease with (acute) exacerbation: Secondary | ICD-10-CM | POA: Diagnosis not present

## 2021-07-21 DIAGNOSIS — I2582 Chronic total occlusion of coronary artery: Secondary | ICD-10-CM | POA: Diagnosis not present

## 2021-07-24 DIAGNOSIS — I10 Essential (primary) hypertension: Secondary | ICD-10-CM | POA: Diagnosis not present

## 2021-07-24 DIAGNOSIS — R52 Pain, unspecified: Secondary | ICD-10-CM | POA: Diagnosis not present

## 2021-07-24 DIAGNOSIS — K219 Gastro-esophageal reflux disease without esophagitis: Secondary | ICD-10-CM | POA: Diagnosis not present

## 2021-07-24 DIAGNOSIS — E785 Hyperlipidemia, unspecified: Secondary | ICD-10-CM | POA: Diagnosis not present

## 2021-07-24 DIAGNOSIS — I509 Heart failure, unspecified: Secondary | ICD-10-CM | POA: Diagnosis not present

## 2021-07-24 DIAGNOSIS — J441 Chronic obstructive pulmonary disease with (acute) exacerbation: Secondary | ICD-10-CM | POA: Diagnosis not present

## 2021-07-24 DIAGNOSIS — E539 Vitamin B deficiency, unspecified: Secondary | ICD-10-CM | POA: Diagnosis not present

## 2021-07-24 DIAGNOSIS — I2582 Chronic total occlusion of coronary artery: Secondary | ICD-10-CM | POA: Diagnosis not present

## 2021-07-24 DIAGNOSIS — I739 Peripheral vascular disease, unspecified: Secondary | ICD-10-CM | POA: Diagnosis not present

## 2021-07-24 DIAGNOSIS — R5383 Other fatigue: Secondary | ICD-10-CM | POA: Diagnosis not present

## 2021-07-25 DIAGNOSIS — E539 Vitamin B deficiency, unspecified: Secondary | ICD-10-CM | POA: Diagnosis not present

## 2021-07-25 DIAGNOSIS — I2582 Chronic total occlusion of coronary artery: Secondary | ICD-10-CM | POA: Diagnosis not present

## 2021-07-25 DIAGNOSIS — I10 Essential (primary) hypertension: Secondary | ICD-10-CM | POA: Diagnosis not present

## 2021-07-25 DIAGNOSIS — I739 Peripheral vascular disease, unspecified: Secondary | ICD-10-CM | POA: Diagnosis not present

## 2021-07-25 DIAGNOSIS — E785 Hyperlipidemia, unspecified: Secondary | ICD-10-CM | POA: Diagnosis not present

## 2021-07-25 DIAGNOSIS — I509 Heart failure, unspecified: Secondary | ICD-10-CM | POA: Diagnosis not present

## 2021-07-25 DIAGNOSIS — J441 Chronic obstructive pulmonary disease with (acute) exacerbation: Secondary | ICD-10-CM | POA: Diagnosis not present

## 2021-07-25 DIAGNOSIS — K219 Gastro-esophageal reflux disease without esophagitis: Secondary | ICD-10-CM | POA: Diagnosis not present

## 2021-07-25 DIAGNOSIS — R52 Pain, unspecified: Secondary | ICD-10-CM | POA: Diagnosis not present

## 2021-07-25 DIAGNOSIS — R5383 Other fatigue: Secondary | ICD-10-CM | POA: Diagnosis not present

## 2021-07-26 DIAGNOSIS — I509 Heart failure, unspecified: Secondary | ICD-10-CM | POA: Diagnosis not present

## 2021-07-26 DIAGNOSIS — E539 Vitamin B deficiency, unspecified: Secondary | ICD-10-CM | POA: Diagnosis not present

## 2021-07-26 DIAGNOSIS — I739 Peripheral vascular disease, unspecified: Secondary | ICD-10-CM | POA: Diagnosis not present

## 2021-07-26 DIAGNOSIS — R5383 Other fatigue: Secondary | ICD-10-CM | POA: Diagnosis not present

## 2021-07-26 DIAGNOSIS — I10 Essential (primary) hypertension: Secondary | ICD-10-CM | POA: Diagnosis not present

## 2021-07-26 DIAGNOSIS — I2582 Chronic total occlusion of coronary artery: Secondary | ICD-10-CM | POA: Diagnosis not present

## 2021-07-26 DIAGNOSIS — R52 Pain, unspecified: Secondary | ICD-10-CM | POA: Diagnosis not present

## 2021-07-26 DIAGNOSIS — K219 Gastro-esophageal reflux disease without esophagitis: Secondary | ICD-10-CM | POA: Diagnosis not present

## 2021-07-26 DIAGNOSIS — E785 Hyperlipidemia, unspecified: Secondary | ICD-10-CM | POA: Diagnosis not present

## 2021-07-26 DIAGNOSIS — J441 Chronic obstructive pulmonary disease with (acute) exacerbation: Secondary | ICD-10-CM | POA: Diagnosis not present

## 2021-07-27 DIAGNOSIS — I509 Heart failure, unspecified: Secondary | ICD-10-CM | POA: Diagnosis not present

## 2021-07-27 DIAGNOSIS — R5383 Other fatigue: Secondary | ICD-10-CM | POA: Diagnosis not present

## 2021-07-27 DIAGNOSIS — J441 Chronic obstructive pulmonary disease with (acute) exacerbation: Secondary | ICD-10-CM | POA: Diagnosis not present

## 2021-07-27 DIAGNOSIS — R52 Pain, unspecified: Secondary | ICD-10-CM | POA: Diagnosis not present

## 2021-07-27 DIAGNOSIS — I10 Essential (primary) hypertension: Secondary | ICD-10-CM | POA: Diagnosis not present

## 2021-07-27 DIAGNOSIS — K219 Gastro-esophageal reflux disease without esophagitis: Secondary | ICD-10-CM | POA: Diagnosis not present

## 2021-07-27 DIAGNOSIS — E539 Vitamin B deficiency, unspecified: Secondary | ICD-10-CM | POA: Diagnosis not present

## 2021-07-27 DIAGNOSIS — I2582 Chronic total occlusion of coronary artery: Secondary | ICD-10-CM | POA: Diagnosis not present

## 2021-07-27 DIAGNOSIS — E785 Hyperlipidemia, unspecified: Secondary | ICD-10-CM | POA: Diagnosis not present

## 2021-07-27 DIAGNOSIS — I739 Peripheral vascular disease, unspecified: Secondary | ICD-10-CM | POA: Diagnosis not present

## 2021-07-28 DIAGNOSIS — J441 Chronic obstructive pulmonary disease with (acute) exacerbation: Secondary | ICD-10-CM | POA: Diagnosis not present

## 2021-07-28 DIAGNOSIS — I2582 Chronic total occlusion of coronary artery: Secondary | ICD-10-CM | POA: Diagnosis not present

## 2021-07-28 DIAGNOSIS — I509 Heart failure, unspecified: Secondary | ICD-10-CM | POA: Diagnosis not present

## 2021-07-28 DIAGNOSIS — K219 Gastro-esophageal reflux disease without esophagitis: Secondary | ICD-10-CM | POA: Diagnosis not present

## 2021-07-28 DIAGNOSIS — R5383 Other fatigue: Secondary | ICD-10-CM | POA: Diagnosis not present

## 2021-07-28 DIAGNOSIS — I739 Peripheral vascular disease, unspecified: Secondary | ICD-10-CM | POA: Diagnosis not present

## 2021-07-28 DIAGNOSIS — I10 Essential (primary) hypertension: Secondary | ICD-10-CM | POA: Diagnosis not present

## 2021-07-28 DIAGNOSIS — E539 Vitamin B deficiency, unspecified: Secondary | ICD-10-CM | POA: Diagnosis not present

## 2021-07-28 DIAGNOSIS — R52 Pain, unspecified: Secondary | ICD-10-CM | POA: Diagnosis not present

## 2021-07-28 DIAGNOSIS — E785 Hyperlipidemia, unspecified: Secondary | ICD-10-CM | POA: Diagnosis not present

## 2021-07-31 DIAGNOSIS — J441 Chronic obstructive pulmonary disease with (acute) exacerbation: Secondary | ICD-10-CM | POA: Diagnosis not present

## 2021-07-31 DIAGNOSIS — I739 Peripheral vascular disease, unspecified: Secondary | ICD-10-CM | POA: Diagnosis not present

## 2021-07-31 DIAGNOSIS — R5383 Other fatigue: Secondary | ICD-10-CM | POA: Diagnosis not present

## 2021-07-31 DIAGNOSIS — K219 Gastro-esophageal reflux disease without esophagitis: Secondary | ICD-10-CM | POA: Diagnosis not present

## 2021-07-31 DIAGNOSIS — I509 Heart failure, unspecified: Secondary | ICD-10-CM | POA: Diagnosis not present

## 2021-07-31 DIAGNOSIS — I2582 Chronic total occlusion of coronary artery: Secondary | ICD-10-CM | POA: Diagnosis not present

## 2021-07-31 DIAGNOSIS — E785 Hyperlipidemia, unspecified: Secondary | ICD-10-CM | POA: Diagnosis not present

## 2021-07-31 DIAGNOSIS — R52 Pain, unspecified: Secondary | ICD-10-CM | POA: Diagnosis not present

## 2021-07-31 DIAGNOSIS — I10 Essential (primary) hypertension: Secondary | ICD-10-CM | POA: Diagnosis not present

## 2021-07-31 DIAGNOSIS — E539 Vitamin B deficiency, unspecified: Secondary | ICD-10-CM | POA: Diagnosis not present

## 2021-08-01 DIAGNOSIS — I509 Heart failure, unspecified: Secondary | ICD-10-CM | POA: Diagnosis not present

## 2021-08-01 DIAGNOSIS — I10 Essential (primary) hypertension: Secondary | ICD-10-CM | POA: Diagnosis not present

## 2021-08-01 DIAGNOSIS — J441 Chronic obstructive pulmonary disease with (acute) exacerbation: Secondary | ICD-10-CM | POA: Diagnosis not present

## 2021-08-01 DIAGNOSIS — E785 Hyperlipidemia, unspecified: Secondary | ICD-10-CM | POA: Diagnosis not present

## 2021-08-01 DIAGNOSIS — I2582 Chronic total occlusion of coronary artery: Secondary | ICD-10-CM | POA: Diagnosis not present

## 2021-08-01 DIAGNOSIS — R5383 Other fatigue: Secondary | ICD-10-CM | POA: Diagnosis not present

## 2021-08-01 DIAGNOSIS — K219 Gastro-esophageal reflux disease without esophagitis: Secondary | ICD-10-CM | POA: Diagnosis not present

## 2021-08-01 DIAGNOSIS — I739 Peripheral vascular disease, unspecified: Secondary | ICD-10-CM | POA: Diagnosis not present

## 2021-08-01 DIAGNOSIS — R52 Pain, unspecified: Secondary | ICD-10-CM | POA: Diagnosis not present

## 2021-08-01 DIAGNOSIS — E539 Vitamin B deficiency, unspecified: Secondary | ICD-10-CM | POA: Diagnosis not present

## 2021-08-03 DIAGNOSIS — E539 Vitamin B deficiency, unspecified: Secondary | ICD-10-CM | POA: Diagnosis not present

## 2021-08-03 DIAGNOSIS — I10 Essential (primary) hypertension: Secondary | ICD-10-CM | POA: Diagnosis not present

## 2021-08-03 DIAGNOSIS — E785 Hyperlipidemia, unspecified: Secondary | ICD-10-CM | POA: Diagnosis not present

## 2021-08-03 DIAGNOSIS — I2582 Chronic total occlusion of coronary artery: Secondary | ICD-10-CM | POA: Diagnosis not present

## 2021-08-03 DIAGNOSIS — I739 Peripheral vascular disease, unspecified: Secondary | ICD-10-CM | POA: Diagnosis not present

## 2021-08-03 DIAGNOSIS — R5383 Other fatigue: Secondary | ICD-10-CM | POA: Diagnosis not present

## 2021-08-03 DIAGNOSIS — I509 Heart failure, unspecified: Secondary | ICD-10-CM | POA: Diagnosis not present

## 2021-08-03 DIAGNOSIS — J441 Chronic obstructive pulmonary disease with (acute) exacerbation: Secondary | ICD-10-CM | POA: Diagnosis not present

## 2021-08-03 DIAGNOSIS — K219 Gastro-esophageal reflux disease without esophagitis: Secondary | ICD-10-CM | POA: Diagnosis not present

## 2021-08-03 DIAGNOSIS — R52 Pain, unspecified: Secondary | ICD-10-CM | POA: Diagnosis not present

## 2021-08-04 DIAGNOSIS — R5383 Other fatigue: Secondary | ICD-10-CM | POA: Diagnosis not present

## 2021-08-04 DIAGNOSIS — I2582 Chronic total occlusion of coronary artery: Secondary | ICD-10-CM | POA: Diagnosis not present

## 2021-08-04 DIAGNOSIS — R52 Pain, unspecified: Secondary | ICD-10-CM | POA: Diagnosis not present

## 2021-08-04 DIAGNOSIS — E539 Vitamin B deficiency, unspecified: Secondary | ICD-10-CM | POA: Diagnosis not present

## 2021-08-04 DIAGNOSIS — I509 Heart failure, unspecified: Secondary | ICD-10-CM | POA: Diagnosis not present

## 2021-08-04 DIAGNOSIS — E785 Hyperlipidemia, unspecified: Secondary | ICD-10-CM | POA: Diagnosis not present

## 2021-08-04 DIAGNOSIS — K219 Gastro-esophageal reflux disease without esophagitis: Secondary | ICD-10-CM | POA: Diagnosis not present

## 2021-08-04 DIAGNOSIS — J441 Chronic obstructive pulmonary disease with (acute) exacerbation: Secondary | ICD-10-CM | POA: Diagnosis not present

## 2021-08-04 DIAGNOSIS — I739 Peripheral vascular disease, unspecified: Secondary | ICD-10-CM | POA: Diagnosis not present

## 2021-08-04 DIAGNOSIS — I10 Essential (primary) hypertension: Secondary | ICD-10-CM | POA: Diagnosis not present

## 2021-08-05 DIAGNOSIS — I509 Heart failure, unspecified: Secondary | ICD-10-CM | POA: Diagnosis not present

## 2021-08-05 DIAGNOSIS — I10 Essential (primary) hypertension: Secondary | ICD-10-CM | POA: Diagnosis not present

## 2021-08-05 DIAGNOSIS — E539 Vitamin B deficiency, unspecified: Secondary | ICD-10-CM | POA: Diagnosis not present

## 2021-08-05 DIAGNOSIS — K219 Gastro-esophageal reflux disease without esophagitis: Secondary | ICD-10-CM | POA: Diagnosis not present

## 2021-08-05 DIAGNOSIS — R5383 Other fatigue: Secondary | ICD-10-CM | POA: Diagnosis not present

## 2021-08-05 DIAGNOSIS — E785 Hyperlipidemia, unspecified: Secondary | ICD-10-CM | POA: Diagnosis not present

## 2021-08-05 DIAGNOSIS — I739 Peripheral vascular disease, unspecified: Secondary | ICD-10-CM | POA: Diagnosis not present

## 2021-08-05 DIAGNOSIS — I2582 Chronic total occlusion of coronary artery: Secondary | ICD-10-CM | POA: Diagnosis not present

## 2021-08-05 DIAGNOSIS — J441 Chronic obstructive pulmonary disease with (acute) exacerbation: Secondary | ICD-10-CM | POA: Diagnosis not present

## 2021-08-05 DIAGNOSIS — R52 Pain, unspecified: Secondary | ICD-10-CM | POA: Diagnosis not present

## 2021-08-07 DIAGNOSIS — I739 Peripheral vascular disease, unspecified: Secondary | ICD-10-CM | POA: Diagnosis not present

## 2021-08-07 DIAGNOSIS — I10 Essential (primary) hypertension: Secondary | ICD-10-CM | POA: Diagnosis not present

## 2021-08-07 DIAGNOSIS — E539 Vitamin B deficiency, unspecified: Secondary | ICD-10-CM | POA: Diagnosis not present

## 2021-08-07 DIAGNOSIS — K219 Gastro-esophageal reflux disease without esophagitis: Secondary | ICD-10-CM | POA: Diagnosis not present

## 2021-08-07 DIAGNOSIS — J441 Chronic obstructive pulmonary disease with (acute) exacerbation: Secondary | ICD-10-CM | POA: Diagnosis not present

## 2021-08-07 DIAGNOSIS — I509 Heart failure, unspecified: Secondary | ICD-10-CM | POA: Diagnosis not present

## 2021-08-07 DIAGNOSIS — R52 Pain, unspecified: Secondary | ICD-10-CM | POA: Diagnosis not present

## 2021-08-07 DIAGNOSIS — R5383 Other fatigue: Secondary | ICD-10-CM | POA: Diagnosis not present

## 2021-08-07 DIAGNOSIS — E785 Hyperlipidemia, unspecified: Secondary | ICD-10-CM | POA: Diagnosis not present

## 2021-08-07 DIAGNOSIS — I2582 Chronic total occlusion of coronary artery: Secondary | ICD-10-CM | POA: Diagnosis not present

## 2021-08-16 IMAGING — DX DG CHEST 1V PORT
1 series · 1 of 1 positions shown · non-contrast
Comparison: 06/19/2018 chest radiograph.

CLINICAL DATA: Dyspnea, respiratory distress

EXAM:
PORTABLE CHEST 1 VIEW

[chest ap]
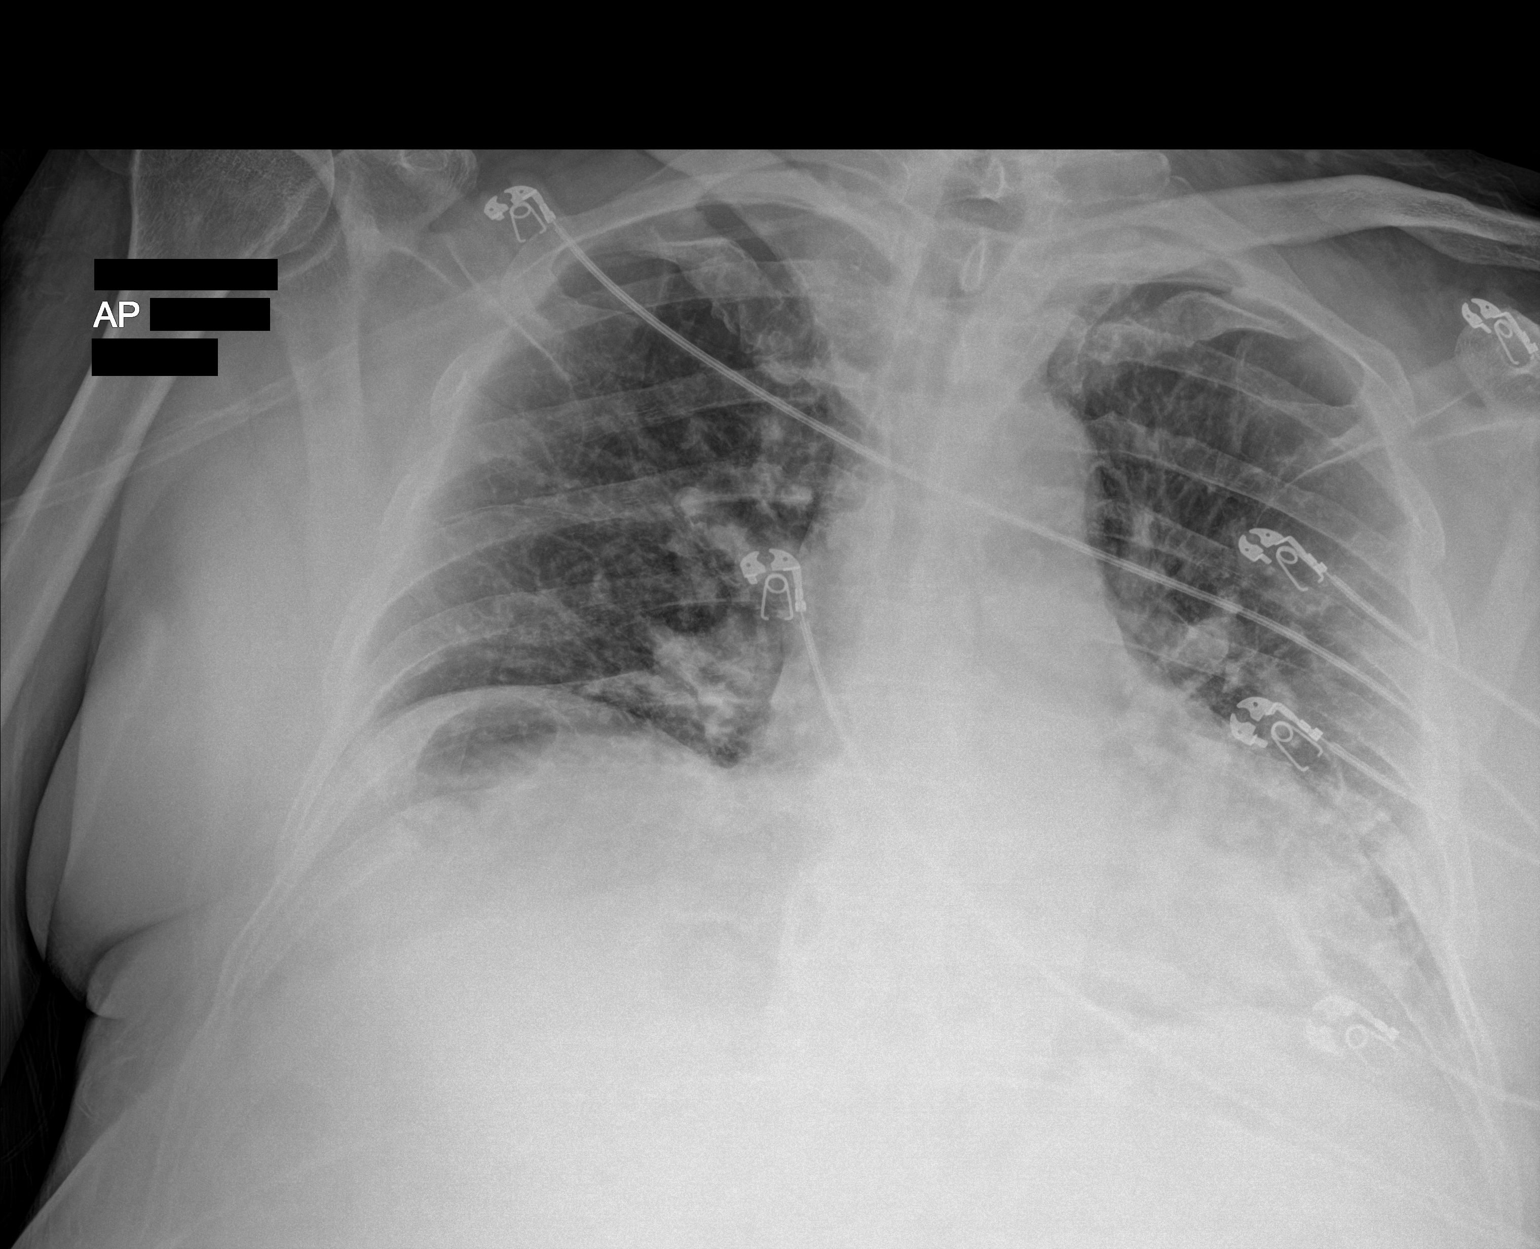

[1 of 1 positions shown; findings below may reference images not displayed]

FINDINGS: Low lung volumes. Stable cardiomediastinal silhouette with normal
heart size. No pneumothorax. No pleural effusion. Hazy streaky
bibasilar lung opacities.
IMPRESSION: Low lung volumes. Hazy streaky bibasilar lung opacities, favor
atelectasis.

## 2021-08-19 IMAGING — DX DG CHEST 1V PORT
1 series · 1 of 1 positions shown · non-contrast
Comparison: August 04, 2020

CLINICAL DATA: Shortness of breath. CHF/COPD exacerbation. Evaluate
infiltrate.

EXAM:
PORTABLE CHEST 1 VIEW

[chest ap]
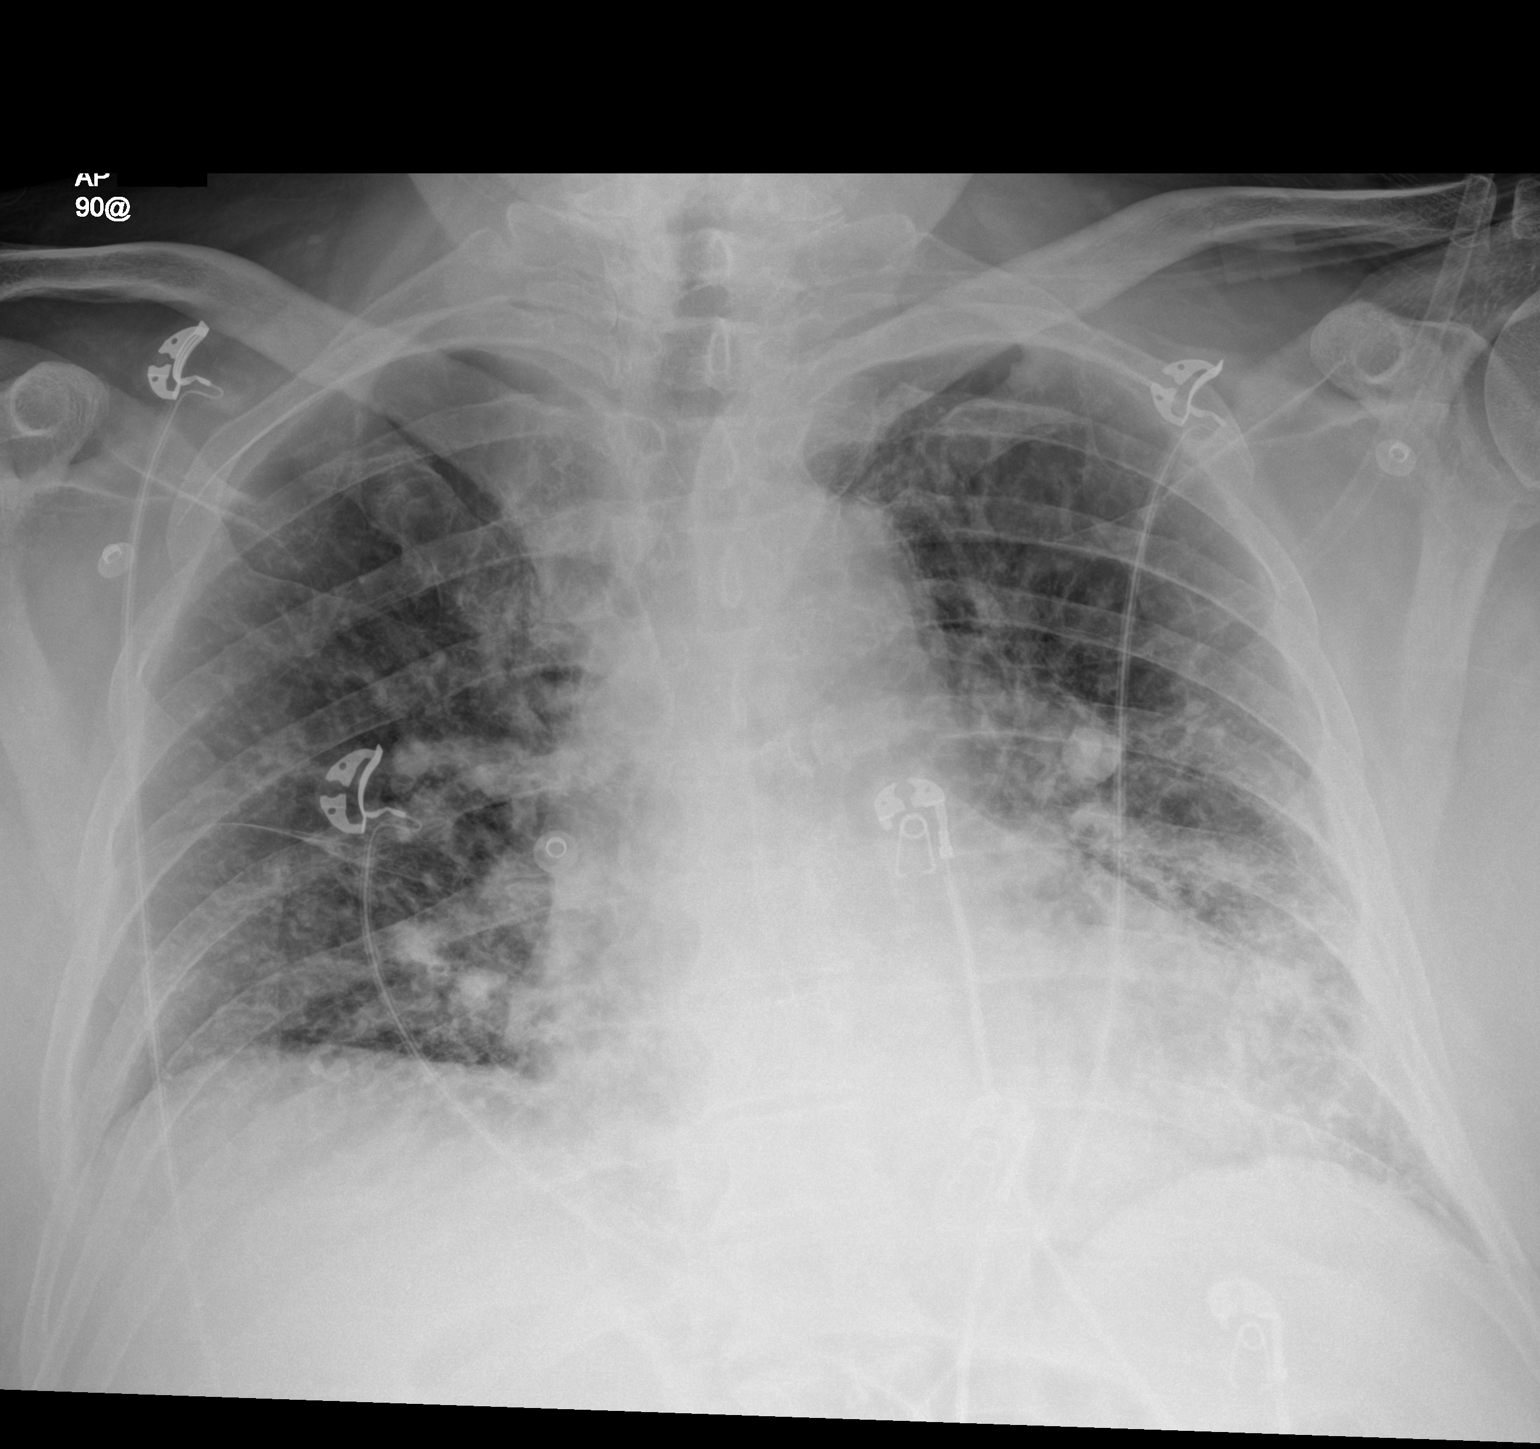

[1 of 1 positions shown; findings below may reference images not displayed]

FINDINGS: Coarsened interstitial markings bilaterally with more focal opacity
in the left mid lower lung. Stable cardiomegaly. The hila and
mediastinum are unchanged. No pneumothorax. No other acute
abnormalities.
IMPRESSION: 1. Coarsened interstitium bilaterally with more focal opacity in the
left base. An infectious process/pneumonia in the left base is
considered most likely. Asymmetric edema is considered less likely.
Recommend clinical correlation and attention on follow-up.

## 2021-09-08 ENCOUNTER — Non-Acute Institutional Stay: Payer: Medicare Other | Admitting: Primary Care

## 2021-09-08 DIAGNOSIS — Z515 Encounter for palliative care: Secondary | ICD-10-CM

## 2021-09-08 DIAGNOSIS — M6281 Muscle weakness (generalized): Secondary | ICD-10-CM | POA: Diagnosis not present

## 2021-09-08 DIAGNOSIS — J9611 Chronic respiratory failure with hypoxia: Secondary | ICD-10-CM | POA: Diagnosis not present

## 2021-09-08 DIAGNOSIS — J449 Chronic obstructive pulmonary disease, unspecified: Secondary | ICD-10-CM | POA: Diagnosis not present

## 2021-09-08 DIAGNOSIS — F32A Depression, unspecified: Secondary | ICD-10-CM | POA: Diagnosis not present

## 2021-09-08 NOTE — Progress Notes (Signed)
[facility] Palliative Care Consult Note Telephone: [phone]  Fax: [phone]    Date of encounter: 09/08/21 1:07 PM PATIENT NAME: Anthony Chambers [address]   [phone] (home)  DOB: 1945-09-01 MRN: [id number] PRIMARY CARE PROVIDER:    Rica Koyanagi, MD,  [place] [address] [phone]  REFERRING PROVIDER:   Rica Chambers, [place] [address] [place],  [place] [address] [phone]  RESPONSIBLE PARTY:    Contact Information     Name Relation Home Work Mobile   Anthony Chambers Sister [phone] [place] Relative   [place] [phone]     [place] [phone]         I met face to face [facility]  at [facility]. Palliative Care was asked to follow this patient by consultation request of  Anthony Koyanagi, MD to address advance care planning and complex medical decision making. This is a follow up visit.                                   ASSESSMENT AND PLAN / RECOMMENDATIONS:   Advance Care Planning/Goals of Care: Goals include to maximize quality of life and symptom management.  Exploration of personal, cultural or spiritual beliefs that might influence medical decisions  Identification of a healthcare agent- sister CODE STATUS: FULL  Symptom Management/Plan:  I met with patient in his nursing home room. He denies pain today. He is wheelchair-bound and not able to ambulate. However, he is able to mobilize in the wheelchair to most desired locations. He enjoys bingo and has a schedule for when these activities are held. He states he feels he has been losing weight and does visibly appear to have had weight loss but on review of records weight is stable.  Edema today is about 1+,  he has on his oxygen with slight dyspnea on speaking with me.     Follow up Palliative Care Visit: Palliative care will continue to follow  for complex medical decision making, advance care planning, and clarification of goals. Return 12 weeks or prn.  I spent 25 minutes providing this consultation. More than 50% of the time in this consultation was spent in counseling and care coordination.  PPS: 30%  HOSPICE ELIGIBILITY/DIAGNOSIS: no  Chief Complaint: debility, COPD, immobility  HISTORY OF PRESENT ILLNESS:  Anthony Chambers is a 76 y.o. year old male  with h/o schizophrenia, debility, COPD, immobility . Patient seen today to review palliative care needs to include medical decision making and advance care planning as appropriate.   History obtained from review of EMR, discussion with primary team, and interview with family, facility staff/caregiver and/or [name].  I reviewed available labs, medications, imaging, studies and related documents from the EMR.  Records reviewed and summarized above.   ROS [place] [facility]  General: NAD EYES: denies vision changes ENMT: denies dysphagia Cardiovascular: denies chest pain, denies DOE Pulmonary: denies cough, denies increased SOB, has cpap Abdomen: endorses good appetite, denies constipation, endorses continence of bowel GU: denies dysuria, endorses continence of urine MSK:  denies  increased weakness,  no falls reported Skin: denies rashes or wounds Neurological: denies pain, denies insomnia Psych: Endorses positive mood  Physical Exam: Current and past weights: 226 lbs, stable Constitutional: NAD General: frail appearing EYES: anicteric sclera, lids intact, no discharge  ENMT: intact  hearing, oral mucous membranes moist CV: 1+ LE edema Pulmonary:  no increased work of breathing, no cough, oxygen 2 L Abdomen: intake 75-100%, soft and non tender, no ascites MSK: + sarcopenia, moves all extremities,  non ambulatory Skin: warm and dry, no rashes or wounds on visible skin Neuro:  no new generalized weakness,  + cognitive impairment, non-anxious affect   Thank you for the  opportunity to participate in the care of [name].  The palliative care team will continue to follow. Please call our office at [phone] if we can be of additional assistance.   Anthony Coop, NP DNP, AGPCNP-BC  COVID-19 PATIENT SCREENING TOOL Asked and negative response unless otherwise noted:   Have you had symptoms of covid, tested positive or been in contact with someone with symptoms/positive test in the past 5-10 days?

## 2021-09-14 DIAGNOSIS — I5022 Chronic systolic (congestive) heart failure: Secondary | ICD-10-CM | POA: Diagnosis not present

## 2021-10-06 DIAGNOSIS — I509 Heart failure, unspecified: Secondary | ICD-10-CM | POA: Diagnosis not present

## 2021-10-06 DIAGNOSIS — J441 Chronic obstructive pulmonary disease with (acute) exacerbation: Secondary | ICD-10-CM | POA: Diagnosis not present

## 2021-10-06 DIAGNOSIS — R2681 Unsteadiness on feet: Secondary | ICD-10-CM | POA: Diagnosis not present

## 2021-10-06 DIAGNOSIS — M6281 Muscle weakness (generalized): Secondary | ICD-10-CM | POA: Diagnosis not present

## 2021-10-09 DIAGNOSIS — I509 Heart failure, unspecified: Secondary | ICD-10-CM | POA: Diagnosis not present

## 2021-10-09 DIAGNOSIS — E785 Hyperlipidemia, unspecified: Secondary | ICD-10-CM | POA: Diagnosis not present

## 2021-10-09 DIAGNOSIS — R52 Pain, unspecified: Secondary | ICD-10-CM | POA: Diagnosis not present

## 2021-10-09 DIAGNOSIS — E539 Vitamin B deficiency, unspecified: Secondary | ICD-10-CM | POA: Diagnosis not present

## 2021-10-09 DIAGNOSIS — K219 Gastro-esophageal reflux disease without esophagitis: Secondary | ICD-10-CM | POA: Diagnosis not present

## 2021-10-09 DIAGNOSIS — I739 Peripheral vascular disease, unspecified: Secondary | ICD-10-CM | POA: Diagnosis not present

## 2021-10-09 DIAGNOSIS — J441 Chronic obstructive pulmonary disease with (acute) exacerbation: Secondary | ICD-10-CM | POA: Diagnosis not present

## 2021-10-09 DIAGNOSIS — I2582 Chronic total occlusion of coronary artery: Secondary | ICD-10-CM | POA: Diagnosis not present

## 2021-10-09 DIAGNOSIS — R2681 Unsteadiness on feet: Secondary | ICD-10-CM | POA: Diagnosis not present

## 2021-10-10 DIAGNOSIS — I2582 Chronic total occlusion of coronary artery: Secondary | ICD-10-CM | POA: Diagnosis not present

## 2021-10-10 DIAGNOSIS — K219 Gastro-esophageal reflux disease without esophagitis: Secondary | ICD-10-CM | POA: Diagnosis not present

## 2021-10-10 DIAGNOSIS — E539 Vitamin B deficiency, unspecified: Secondary | ICD-10-CM | POA: Diagnosis not present

## 2021-10-10 DIAGNOSIS — I509 Heart failure, unspecified: Secondary | ICD-10-CM | POA: Diagnosis not present

## 2021-10-10 DIAGNOSIS — R2681 Unsteadiness on feet: Secondary | ICD-10-CM | POA: Diagnosis not present

## 2021-10-10 DIAGNOSIS — R52 Pain, unspecified: Secondary | ICD-10-CM | POA: Diagnosis not present

## 2021-10-10 DIAGNOSIS — J441 Chronic obstructive pulmonary disease with (acute) exacerbation: Secondary | ICD-10-CM | POA: Diagnosis not present

## 2021-10-10 DIAGNOSIS — I739 Peripheral vascular disease, unspecified: Secondary | ICD-10-CM | POA: Diagnosis not present

## 2021-10-10 DIAGNOSIS — E785 Hyperlipidemia, unspecified: Secondary | ICD-10-CM | POA: Diagnosis not present

## 2021-10-11 DIAGNOSIS — J984 Other disorders of lung: Secondary | ICD-10-CM | POA: Diagnosis not present

## 2021-10-11 DIAGNOSIS — K219 Gastro-esophageal reflux disease without esophagitis: Secondary | ICD-10-CM | POA: Diagnosis not present

## 2021-10-11 DIAGNOSIS — I739 Peripheral vascular disease, unspecified: Secondary | ICD-10-CM | POA: Diagnosis not present

## 2021-10-11 DIAGNOSIS — J441 Chronic obstructive pulmonary disease with (acute) exacerbation: Secondary | ICD-10-CM | POA: Diagnosis not present

## 2021-10-11 DIAGNOSIS — R52 Pain, unspecified: Secondary | ICD-10-CM | POA: Diagnosis not present

## 2021-10-11 DIAGNOSIS — I2582 Chronic total occlusion of coronary artery: Secondary | ICD-10-CM | POA: Diagnosis not present

## 2021-10-11 DIAGNOSIS — G4733 Obstructive sleep apnea (adult) (pediatric): Secondary | ICD-10-CM | POA: Diagnosis not present

## 2021-10-11 DIAGNOSIS — J439 Emphysema, unspecified: Secondary | ICD-10-CM | POA: Diagnosis not present

## 2021-10-11 DIAGNOSIS — R2681 Unsteadiness on feet: Secondary | ICD-10-CM | POA: Diagnosis not present

## 2021-10-11 DIAGNOSIS — I509 Heart failure, unspecified: Secondary | ICD-10-CM | POA: Diagnosis not present

## 2021-10-11 DIAGNOSIS — E539 Vitamin B deficiency, unspecified: Secondary | ICD-10-CM | POA: Diagnosis not present

## 2021-10-11 DIAGNOSIS — E785 Hyperlipidemia, unspecified: Secondary | ICD-10-CM | POA: Diagnosis not present

## 2021-10-11 DIAGNOSIS — J9611 Chronic respiratory failure with hypoxia: Secondary | ICD-10-CM | POA: Diagnosis not present

## 2021-10-12 DIAGNOSIS — R2681 Unsteadiness on feet: Secondary | ICD-10-CM | POA: Diagnosis not present

## 2021-10-12 DIAGNOSIS — I739 Peripheral vascular disease, unspecified: Secondary | ICD-10-CM | POA: Diagnosis not present

## 2021-10-12 DIAGNOSIS — J441 Chronic obstructive pulmonary disease with (acute) exacerbation: Secondary | ICD-10-CM | POA: Diagnosis not present

## 2021-10-12 DIAGNOSIS — K219 Gastro-esophageal reflux disease without esophagitis: Secondary | ICD-10-CM | POA: Diagnosis not present

## 2021-10-12 DIAGNOSIS — E539 Vitamin B deficiency, unspecified: Secondary | ICD-10-CM | POA: Diagnosis not present

## 2021-10-12 DIAGNOSIS — I2582 Chronic total occlusion of coronary artery: Secondary | ICD-10-CM | POA: Diagnosis not present

## 2021-10-12 DIAGNOSIS — R52 Pain, unspecified: Secondary | ICD-10-CM | POA: Diagnosis not present

## 2021-10-12 DIAGNOSIS — I509 Heart failure, unspecified: Secondary | ICD-10-CM | POA: Diagnosis not present

## 2021-10-12 DIAGNOSIS — E785 Hyperlipidemia, unspecified: Secondary | ICD-10-CM | POA: Diagnosis not present

## 2021-10-13 DIAGNOSIS — K219 Gastro-esophageal reflux disease without esophagitis: Secondary | ICD-10-CM | POA: Diagnosis not present

## 2021-10-13 DIAGNOSIS — I509 Heart failure, unspecified: Secondary | ICD-10-CM | POA: Diagnosis not present

## 2021-10-13 DIAGNOSIS — R52 Pain, unspecified: Secondary | ICD-10-CM | POA: Diagnosis not present

## 2021-10-13 DIAGNOSIS — J441 Chronic obstructive pulmonary disease with (acute) exacerbation: Secondary | ICD-10-CM | POA: Diagnosis not present

## 2021-10-13 DIAGNOSIS — E539 Vitamin B deficiency, unspecified: Secondary | ICD-10-CM | POA: Diagnosis not present

## 2021-10-13 DIAGNOSIS — I739 Peripheral vascular disease, unspecified: Secondary | ICD-10-CM | POA: Diagnosis not present

## 2021-10-13 DIAGNOSIS — I2582 Chronic total occlusion of coronary artery: Secondary | ICD-10-CM | POA: Diagnosis not present

## 2021-10-13 DIAGNOSIS — E785 Hyperlipidemia, unspecified: Secondary | ICD-10-CM | POA: Diagnosis not present

## 2021-10-13 DIAGNOSIS — R2681 Unsteadiness on feet: Secondary | ICD-10-CM | POA: Diagnosis not present

## 2021-10-16 DIAGNOSIS — E785 Hyperlipidemia, unspecified: Secondary | ICD-10-CM | POA: Diagnosis not present

## 2021-10-16 DIAGNOSIS — J441 Chronic obstructive pulmonary disease with (acute) exacerbation: Secondary | ICD-10-CM | POA: Diagnosis not present

## 2021-10-16 DIAGNOSIS — R2681 Unsteadiness on feet: Secondary | ICD-10-CM | POA: Diagnosis not present

## 2021-10-16 DIAGNOSIS — K219 Gastro-esophageal reflux disease without esophagitis: Secondary | ICD-10-CM | POA: Diagnosis not present

## 2021-10-16 DIAGNOSIS — I2582 Chronic total occlusion of coronary artery: Secondary | ICD-10-CM | POA: Diagnosis not present

## 2021-10-16 DIAGNOSIS — E539 Vitamin B deficiency, unspecified: Secondary | ICD-10-CM | POA: Diagnosis not present

## 2021-10-16 DIAGNOSIS — I739 Peripheral vascular disease, unspecified: Secondary | ICD-10-CM | POA: Diagnosis not present

## 2021-10-16 DIAGNOSIS — I509 Heart failure, unspecified: Secondary | ICD-10-CM | POA: Diagnosis not present

## 2021-10-16 DIAGNOSIS — R52 Pain, unspecified: Secondary | ICD-10-CM | POA: Diagnosis not present

## 2021-10-17 DIAGNOSIS — I739 Peripheral vascular disease, unspecified: Secondary | ICD-10-CM | POA: Diagnosis not present

## 2021-10-17 DIAGNOSIS — I509 Heart failure, unspecified: Secondary | ICD-10-CM | POA: Diagnosis not present

## 2021-10-17 DIAGNOSIS — E539 Vitamin B deficiency, unspecified: Secondary | ICD-10-CM | POA: Diagnosis not present

## 2021-10-17 DIAGNOSIS — R2681 Unsteadiness on feet: Secondary | ICD-10-CM | POA: Diagnosis not present

## 2021-10-17 DIAGNOSIS — R52 Pain, unspecified: Secondary | ICD-10-CM | POA: Diagnosis not present

## 2021-10-17 DIAGNOSIS — E785 Hyperlipidemia, unspecified: Secondary | ICD-10-CM | POA: Diagnosis not present

## 2021-10-17 DIAGNOSIS — K219 Gastro-esophageal reflux disease without esophagitis: Secondary | ICD-10-CM | POA: Diagnosis not present

## 2021-10-17 DIAGNOSIS — I2582 Chronic total occlusion of coronary artery: Secondary | ICD-10-CM | POA: Diagnosis not present

## 2021-10-17 DIAGNOSIS — J441 Chronic obstructive pulmonary disease with (acute) exacerbation: Secondary | ICD-10-CM | POA: Diagnosis not present

## 2021-10-18 DIAGNOSIS — R2681 Unsteadiness on feet: Secondary | ICD-10-CM | POA: Diagnosis not present

## 2021-10-18 DIAGNOSIS — I739 Peripheral vascular disease, unspecified: Secondary | ICD-10-CM | POA: Diagnosis not present

## 2021-10-18 DIAGNOSIS — E785 Hyperlipidemia, unspecified: Secondary | ICD-10-CM | POA: Diagnosis not present

## 2021-10-18 DIAGNOSIS — R52 Pain, unspecified: Secondary | ICD-10-CM | POA: Diagnosis not present

## 2021-10-18 DIAGNOSIS — J441 Chronic obstructive pulmonary disease with (acute) exacerbation: Secondary | ICD-10-CM | POA: Diagnosis not present

## 2021-10-18 DIAGNOSIS — L603 Nail dystrophy: Secondary | ICD-10-CM | POA: Diagnosis not present

## 2021-10-18 DIAGNOSIS — I2582 Chronic total occlusion of coronary artery: Secondary | ICD-10-CM | POA: Diagnosis not present

## 2021-10-18 DIAGNOSIS — E539 Vitamin B deficiency, unspecified: Secondary | ICD-10-CM | POA: Diagnosis not present

## 2021-10-18 DIAGNOSIS — B351 Tinea unguium: Secondary | ICD-10-CM | POA: Diagnosis not present

## 2021-10-18 DIAGNOSIS — I509 Heart failure, unspecified: Secondary | ICD-10-CM | POA: Diagnosis not present

## 2021-10-18 DIAGNOSIS — K219 Gastro-esophageal reflux disease without esophagitis: Secondary | ICD-10-CM | POA: Diagnosis not present

## 2021-10-19 DIAGNOSIS — R52 Pain, unspecified: Secondary | ICD-10-CM | POA: Diagnosis not present

## 2021-10-19 DIAGNOSIS — I2582 Chronic total occlusion of coronary artery: Secondary | ICD-10-CM | POA: Diagnosis not present

## 2021-10-19 DIAGNOSIS — I739 Peripheral vascular disease, unspecified: Secondary | ICD-10-CM | POA: Diagnosis not present

## 2021-10-19 DIAGNOSIS — K219 Gastro-esophageal reflux disease without esophagitis: Secondary | ICD-10-CM | POA: Diagnosis not present

## 2021-10-19 DIAGNOSIS — E539 Vitamin B deficiency, unspecified: Secondary | ICD-10-CM | POA: Diagnosis not present

## 2021-10-19 DIAGNOSIS — I509 Heart failure, unspecified: Secondary | ICD-10-CM | POA: Diagnosis not present

## 2021-10-19 DIAGNOSIS — E785 Hyperlipidemia, unspecified: Secondary | ICD-10-CM | POA: Diagnosis not present

## 2021-10-19 DIAGNOSIS — R2681 Unsteadiness on feet: Secondary | ICD-10-CM | POA: Diagnosis not present

## 2021-10-19 DIAGNOSIS — J441 Chronic obstructive pulmonary disease with (acute) exacerbation: Secondary | ICD-10-CM | POA: Diagnosis not present

## 2021-10-20 DIAGNOSIS — K219 Gastro-esophageal reflux disease without esophagitis: Secondary | ICD-10-CM | POA: Diagnosis not present

## 2021-10-20 DIAGNOSIS — J441 Chronic obstructive pulmonary disease with (acute) exacerbation: Secondary | ICD-10-CM | POA: Diagnosis not present

## 2021-10-20 DIAGNOSIS — E539 Vitamin B deficiency, unspecified: Secondary | ICD-10-CM | POA: Diagnosis not present

## 2021-10-20 DIAGNOSIS — I509 Heart failure, unspecified: Secondary | ICD-10-CM | POA: Diagnosis not present

## 2021-10-20 DIAGNOSIS — E785 Hyperlipidemia, unspecified: Secondary | ICD-10-CM | POA: Diagnosis not present

## 2021-10-20 DIAGNOSIS — R2681 Unsteadiness on feet: Secondary | ICD-10-CM | POA: Diagnosis not present

## 2021-10-20 DIAGNOSIS — I739 Peripheral vascular disease, unspecified: Secondary | ICD-10-CM | POA: Diagnosis not present

## 2021-10-20 DIAGNOSIS — R52 Pain, unspecified: Secondary | ICD-10-CM | POA: Diagnosis not present

## 2021-10-20 DIAGNOSIS — I2582 Chronic total occlusion of coronary artery: Secondary | ICD-10-CM | POA: Diagnosis not present

## 2021-10-23 DIAGNOSIS — E539 Vitamin B deficiency, unspecified: Secondary | ICD-10-CM | POA: Diagnosis not present

## 2021-10-23 DIAGNOSIS — I509 Heart failure, unspecified: Secondary | ICD-10-CM | POA: Diagnosis not present

## 2021-10-23 DIAGNOSIS — I739 Peripheral vascular disease, unspecified: Secondary | ICD-10-CM | POA: Diagnosis not present

## 2021-10-23 DIAGNOSIS — J441 Chronic obstructive pulmonary disease with (acute) exacerbation: Secondary | ICD-10-CM | POA: Diagnosis not present

## 2021-10-23 DIAGNOSIS — E785 Hyperlipidemia, unspecified: Secondary | ICD-10-CM | POA: Diagnosis not present

## 2021-10-23 DIAGNOSIS — R52 Pain, unspecified: Secondary | ICD-10-CM | POA: Diagnosis not present

## 2021-10-23 DIAGNOSIS — K219 Gastro-esophageal reflux disease without esophagitis: Secondary | ICD-10-CM | POA: Diagnosis not present

## 2021-10-23 DIAGNOSIS — R2681 Unsteadiness on feet: Secondary | ICD-10-CM | POA: Diagnosis not present

## 2021-10-23 DIAGNOSIS — S81801A Unspecified open wound, right lower leg, initial encounter: Secondary | ICD-10-CM | POA: Diagnosis not present

## 2021-10-23 DIAGNOSIS — I2582 Chronic total occlusion of coronary artery: Secondary | ICD-10-CM | POA: Diagnosis not present

## 2021-10-24 DIAGNOSIS — I739 Peripheral vascular disease, unspecified: Secondary | ICD-10-CM | POA: Diagnosis not present

## 2021-10-24 DIAGNOSIS — R52 Pain, unspecified: Secondary | ICD-10-CM | POA: Diagnosis not present

## 2021-10-24 DIAGNOSIS — I2582 Chronic total occlusion of coronary artery: Secondary | ICD-10-CM | POA: Diagnosis not present

## 2021-10-24 DIAGNOSIS — E539 Vitamin B deficiency, unspecified: Secondary | ICD-10-CM | POA: Diagnosis not present

## 2021-10-24 DIAGNOSIS — J441 Chronic obstructive pulmonary disease with (acute) exacerbation: Secondary | ICD-10-CM | POA: Diagnosis not present

## 2021-10-24 DIAGNOSIS — R2681 Unsteadiness on feet: Secondary | ICD-10-CM | POA: Diagnosis not present

## 2021-10-24 DIAGNOSIS — K219 Gastro-esophageal reflux disease without esophagitis: Secondary | ICD-10-CM | POA: Diagnosis not present

## 2021-10-24 DIAGNOSIS — E785 Hyperlipidemia, unspecified: Secondary | ICD-10-CM | POA: Diagnosis not present

## 2021-10-24 DIAGNOSIS — I509 Heart failure, unspecified: Secondary | ICD-10-CM | POA: Diagnosis not present

## 2021-10-25 DIAGNOSIS — K219 Gastro-esophageal reflux disease without esophagitis: Secondary | ICD-10-CM | POA: Diagnosis not present

## 2021-10-25 DIAGNOSIS — R52 Pain, unspecified: Secondary | ICD-10-CM | POA: Diagnosis not present

## 2021-10-25 DIAGNOSIS — E785 Hyperlipidemia, unspecified: Secondary | ICD-10-CM | POA: Diagnosis not present

## 2021-10-25 DIAGNOSIS — I509 Heart failure, unspecified: Secondary | ICD-10-CM | POA: Diagnosis not present

## 2021-10-25 DIAGNOSIS — R2681 Unsteadiness on feet: Secondary | ICD-10-CM | POA: Diagnosis not present

## 2021-10-25 DIAGNOSIS — I2582 Chronic total occlusion of coronary artery: Secondary | ICD-10-CM | POA: Diagnosis not present

## 2021-10-25 DIAGNOSIS — J441 Chronic obstructive pulmonary disease with (acute) exacerbation: Secondary | ICD-10-CM | POA: Diagnosis not present

## 2021-10-25 DIAGNOSIS — I739 Peripheral vascular disease, unspecified: Secondary | ICD-10-CM | POA: Diagnosis not present

## 2021-10-25 DIAGNOSIS — E539 Vitamin B deficiency, unspecified: Secondary | ICD-10-CM | POA: Diagnosis not present

## 2021-10-26 DIAGNOSIS — E785 Hyperlipidemia, unspecified: Secondary | ICD-10-CM | POA: Diagnosis not present

## 2021-10-26 DIAGNOSIS — K219 Gastro-esophageal reflux disease without esophagitis: Secondary | ICD-10-CM | POA: Diagnosis not present

## 2021-10-26 DIAGNOSIS — E539 Vitamin B deficiency, unspecified: Secondary | ICD-10-CM | POA: Diagnosis not present

## 2021-10-26 DIAGNOSIS — I739 Peripheral vascular disease, unspecified: Secondary | ICD-10-CM | POA: Diagnosis not present

## 2021-10-26 DIAGNOSIS — R2681 Unsteadiness on feet: Secondary | ICD-10-CM | POA: Diagnosis not present

## 2021-10-26 DIAGNOSIS — I509 Heart failure, unspecified: Secondary | ICD-10-CM | POA: Diagnosis not present

## 2021-10-26 DIAGNOSIS — J441 Chronic obstructive pulmonary disease with (acute) exacerbation: Secondary | ICD-10-CM | POA: Diagnosis not present

## 2021-10-26 DIAGNOSIS — R52 Pain, unspecified: Secondary | ICD-10-CM | POA: Diagnosis not present

## 2021-10-26 DIAGNOSIS — I2582 Chronic total occlusion of coronary artery: Secondary | ICD-10-CM | POA: Diagnosis not present

## 2021-10-27 DIAGNOSIS — J441 Chronic obstructive pulmonary disease with (acute) exacerbation: Secondary | ICD-10-CM | POA: Diagnosis not present

## 2021-10-27 DIAGNOSIS — R2681 Unsteadiness on feet: Secondary | ICD-10-CM | POA: Diagnosis not present

## 2021-10-27 DIAGNOSIS — E539 Vitamin B deficiency, unspecified: Secondary | ICD-10-CM | POA: Diagnosis not present

## 2021-10-27 DIAGNOSIS — E785 Hyperlipidemia, unspecified: Secondary | ICD-10-CM | POA: Diagnosis not present

## 2021-10-27 DIAGNOSIS — K219 Gastro-esophageal reflux disease without esophagitis: Secondary | ICD-10-CM | POA: Diagnosis not present

## 2021-10-27 DIAGNOSIS — R52 Pain, unspecified: Secondary | ICD-10-CM | POA: Diagnosis not present

## 2021-10-27 DIAGNOSIS — I509 Heart failure, unspecified: Secondary | ICD-10-CM | POA: Diagnosis not present

## 2021-10-27 DIAGNOSIS — I2582 Chronic total occlusion of coronary artery: Secondary | ICD-10-CM | POA: Diagnosis not present

## 2021-10-27 DIAGNOSIS — I739 Peripheral vascular disease, unspecified: Secondary | ICD-10-CM | POA: Diagnosis not present

## 2021-11-07 DIAGNOSIS — J9611 Chronic respiratory failure with hypoxia: Secondary | ICD-10-CM | POA: Diagnosis not present

## 2021-11-14 DIAGNOSIS — G4733 Obstructive sleep apnea (adult) (pediatric): Secondary | ICD-10-CM | POA: Diagnosis not present

## 2021-11-14 DIAGNOSIS — M6281 Muscle weakness (generalized): Secondary | ICD-10-CM | POA: Diagnosis not present

## 2021-11-14 DIAGNOSIS — J9611 Chronic respiratory failure with hypoxia: Secondary | ICD-10-CM | POA: Diagnosis not present

## 2021-12-11 ENCOUNTER — Encounter: Payer: Self-pay | Admitting: Nurse Practitioner

## 2021-12-11 ENCOUNTER — Non-Acute Institutional Stay: Payer: Medicare Other | Admitting: Nurse Practitioner

## 2021-12-11 VITALS — HR 82 | Resp 18 | Wt 224.6 lb

## 2021-12-11 DIAGNOSIS — R5381 Other malaise: Secondary | ICD-10-CM | POA: Diagnosis not present

## 2021-12-11 DIAGNOSIS — J449 Chronic obstructive pulmonary disease, unspecified: Secondary | ICD-10-CM

## 2021-12-11 DIAGNOSIS — Z515 Encounter for palliative care: Secondary | ICD-10-CM

## 2021-12-11 DIAGNOSIS — R0602 Shortness of breath: Secondary | ICD-10-CM | POA: Diagnosis not present

## 2021-12-11 DIAGNOSIS — I509 Heart failure, unspecified: Secondary | ICD-10-CM | POA: Diagnosis not present

## 2021-12-11 NOTE — Progress Notes (Signed)
[facility] Palliative Care Consult Note Telephone: [phone]  Fax: [phone]    Date of encounter: 12/11/21 11:22 PM PATIENT NAME: Anthony Chambers [id number]   [phone] (home)  DOB: [birth year] MRN: [id number] PRIMARY CARE PROVIDER:   [facility] Anthony Koyanagi, MD,  [place] [address] [phone]  RESPONSIBLE PARTY:    Contact Information     Name Relation Home Work Mobile   Anthony Chambers Sister [phone] [place] Relative   [place] [phone]     [place] [phone]       I met face to face with patient in facility. Palliative Care was asked to follow this patient by consultation request of  Anthony Koyanagi, MD /[facility] to address advance care planning and complex medical decision making. This is a follow up visit.                                  ASSESSMENT AND PLAN / RECOMMENDATIONS:  Symptom Management/Plan: 1. Advance Care Planning;    2. Goals of Care: Goals include to maximize quality of life and symptom management. Our advance care planning conversation included a discussion about:    The value and importance of advance care planning  Exploration of personal, cultural or spiritual beliefs that might influence medical decisions  Exploration of goals of care in the event of a sudden injury or illness  Identification and preparation of a healthcare agent  Review and updating or creation of an advance directive document. 3. Palliative care encounter; Palliative care encounter; Palliative medicine team will continue to support patient, patient's family, and medical team. Visit consisted of counseling and education dealing with the complex and emotionally intense issues of symptom management and palliative care in the setting of serious and potentially life-threatening illness  4. Shortness of breath  secondary to COPD, Chronic diastolic heart failure with O2 dependent; monitor respiratory status, weights, continue current medications reviewed, continue on lasix, monitor routine labs  5. Debility with generalized weakness secondary to co-morbidities, overall decline. Encourage mobility, oob, fall risk, socialization.   Follow up Palliative Care Visit: Palliative care will continue to follow for complex medical decision making, advance care planning, and clarification of goals. Return 4 to 8 weeks or prn.  I spent 46 minutes providing this consultation starting at 2:15 pm. More than 50% of the time in this consultation was spent in counseling and care coordination. PPS: 50% Chief Complaint: Follow up palliative consult for complex medical decision making, address goals, manage ongoing symptoms  HISTORY OF PRESENT ILLNESS:  Anthony Chambers is a 76 y.o. year old male  with multiple medical problems including PVD, h/o DVT, h/o popliteal artery aneurysm, chronic diastolic heart failure, COPD, chronic respiratory failure with hypoxia and hypercapnia, GERD, HLD. Anthony Chambers resides at [place] [facility]. Anthony Chambers requires assistance for transfers, mobility, adl's including bathing, dressing, tray set up. Anthony Anthony Chambers does feed himself with good appetite. At present Anthony Chambers is sitting in the w/c in his room, O2 supplemental, appears comfortable. Anthony Anthony Chambers and I talked about purpose of pc visit, in agreement. We talked about the last time Anthony Chambers was independent, family, social history, past medical history, functional abilities, ros. We talked about the transition to facility living, daily routine, what brings him joy including food. We talked about  appetite, quality of life, role pc in poc. No new changes today. Medical goals, medications and poc reviewed. Therapeutic listening emotional support provided. Questions answered.   History obtained from review of EMR, discussion with primary team, and interview  with family, facility staff/caregiver and/or Anthony Chambers.  I reviewed available labs, medications, imaging, studies and related documents from the EMR.  Records reviewed and summarized above.   ROS 10 point system reviewed all negative except HPI Physical Exam: Constitutional: NAD General: debilitated, pleasant male ENMT: oral mucous membranes moist CV: S1S2, RRR, +BLE edema Pulmonary: breath sounds decreased bases, otherwise clear, O2 supplemental Abdomen: normo-active BS + 4 quadrants, soft and non tender MSK: w/c dependent Skin: warm and dry Neuro:  + generalized weakness Psych: non-anxious affect, A and oriented Thank you for the opportunity to participate in the care of Anthony Chambers. Please call our office at [phone] if we can be of additional assistance.   Cervando Anthony Chambers Anthony Gully, NP

## 2021-12-26 DIAGNOSIS — I739 Peripheral vascular disease, unspecified: Secondary | ICD-10-CM | POA: Diagnosis not present

## 2022-01-02 DIAGNOSIS — E539 Vitamin B deficiency, unspecified: Secondary | ICD-10-CM | POA: Diagnosis not present

## 2022-01-02 DIAGNOSIS — K219 Gastro-esophageal reflux disease without esophagitis: Secondary | ICD-10-CM | POA: Diagnosis not present

## 2022-01-02 DIAGNOSIS — E785 Hyperlipidemia, unspecified: Secondary | ICD-10-CM | POA: Diagnosis not present

## 2022-01-02 DIAGNOSIS — J441 Chronic obstructive pulmonary disease with (acute) exacerbation: Secondary | ICD-10-CM | POA: Diagnosis not present

## 2022-01-02 DIAGNOSIS — R52 Pain, unspecified: Secondary | ICD-10-CM | POA: Diagnosis not present

## 2022-01-02 DIAGNOSIS — I2582 Chronic total occlusion of coronary artery: Secondary | ICD-10-CM | POA: Diagnosis not present

## 2022-01-02 DIAGNOSIS — M6259 Muscle wasting and atrophy, not elsewhere classified, multiple sites: Secondary | ICD-10-CM | POA: Diagnosis not present

## 2022-01-02 DIAGNOSIS — I10 Essential (primary) hypertension: Secondary | ICD-10-CM | POA: Diagnosis not present

## 2022-01-02 DIAGNOSIS — I739 Peripheral vascular disease, unspecified: Secondary | ICD-10-CM | POA: Diagnosis not present

## 2022-01-02 DIAGNOSIS — I82409 Acute embolism and thrombosis of unspecified deep veins of unspecified lower extremity: Secondary | ICD-10-CM | POA: Diagnosis not present

## 2022-01-03 DIAGNOSIS — M6259 Muscle wasting and atrophy, not elsewhere classified, multiple sites: Secondary | ICD-10-CM | POA: Diagnosis not present

## 2022-01-03 DIAGNOSIS — I739 Peripheral vascular disease, unspecified: Secondary | ICD-10-CM | POA: Diagnosis not present

## 2022-01-03 DIAGNOSIS — I2582 Chronic total occlusion of coronary artery: Secondary | ICD-10-CM | POA: Diagnosis not present

## 2022-01-03 DIAGNOSIS — I82409 Acute embolism and thrombosis of unspecified deep veins of unspecified lower extremity: Secondary | ICD-10-CM | POA: Diagnosis not present

## 2022-01-03 DIAGNOSIS — E785 Hyperlipidemia, unspecified: Secondary | ICD-10-CM | POA: Diagnosis not present

## 2022-01-03 DIAGNOSIS — R52 Pain, unspecified: Secondary | ICD-10-CM | POA: Diagnosis not present

## 2022-01-03 DIAGNOSIS — J441 Chronic obstructive pulmonary disease with (acute) exacerbation: Secondary | ICD-10-CM | POA: Diagnosis not present

## 2022-01-03 DIAGNOSIS — K219 Gastro-esophageal reflux disease without esophagitis: Secondary | ICD-10-CM | POA: Diagnosis not present

## 2022-01-03 DIAGNOSIS — E539 Vitamin B deficiency, unspecified: Secondary | ICD-10-CM | POA: Diagnosis not present

## 2022-01-03 DIAGNOSIS — I10 Essential (primary) hypertension: Secondary | ICD-10-CM | POA: Diagnosis not present

## 2022-01-04 DIAGNOSIS — J441 Chronic obstructive pulmonary disease with (acute) exacerbation: Secondary | ICD-10-CM | POA: Diagnosis not present

## 2022-01-04 DIAGNOSIS — K219 Gastro-esophageal reflux disease without esophagitis: Secondary | ICD-10-CM | POA: Diagnosis not present

## 2022-01-04 DIAGNOSIS — I2582 Chronic total occlusion of coronary artery: Secondary | ICD-10-CM | POA: Diagnosis not present

## 2022-01-04 DIAGNOSIS — I82409 Acute embolism and thrombosis of unspecified deep veins of unspecified lower extremity: Secondary | ICD-10-CM | POA: Diagnosis not present

## 2022-01-04 DIAGNOSIS — I739 Peripheral vascular disease, unspecified: Secondary | ICD-10-CM | POA: Diagnosis not present

## 2022-01-04 DIAGNOSIS — E539 Vitamin B deficiency, unspecified: Secondary | ICD-10-CM | POA: Diagnosis not present

## 2022-01-04 DIAGNOSIS — M6259 Muscle wasting and atrophy, not elsewhere classified, multiple sites: Secondary | ICD-10-CM | POA: Diagnosis not present

## 2022-01-04 DIAGNOSIS — R52 Pain, unspecified: Secondary | ICD-10-CM | POA: Diagnosis not present

## 2022-01-04 DIAGNOSIS — E785 Hyperlipidemia, unspecified: Secondary | ICD-10-CM | POA: Diagnosis not present

## 2022-01-04 DIAGNOSIS — I10 Essential (primary) hypertension: Secondary | ICD-10-CM | POA: Diagnosis not present

## 2022-01-05 DIAGNOSIS — E785 Hyperlipidemia, unspecified: Secondary | ICD-10-CM | POA: Diagnosis not present

## 2022-01-05 DIAGNOSIS — I2582 Chronic total occlusion of coronary artery: Secondary | ICD-10-CM | POA: Diagnosis not present

## 2022-01-05 DIAGNOSIS — I82409 Acute embolism and thrombosis of unspecified deep veins of unspecified lower extremity: Secondary | ICD-10-CM | POA: Diagnosis not present

## 2022-01-05 DIAGNOSIS — K219 Gastro-esophageal reflux disease without esophagitis: Secondary | ICD-10-CM | POA: Diagnosis not present

## 2022-01-05 DIAGNOSIS — J441 Chronic obstructive pulmonary disease with (acute) exacerbation: Secondary | ICD-10-CM | POA: Diagnosis not present

## 2022-01-05 DIAGNOSIS — E539 Vitamin B deficiency, unspecified: Secondary | ICD-10-CM | POA: Diagnosis not present

## 2022-01-05 DIAGNOSIS — I739 Peripheral vascular disease, unspecified: Secondary | ICD-10-CM | POA: Diagnosis not present

## 2022-01-05 DIAGNOSIS — M6259 Muscle wasting and atrophy, not elsewhere classified, multiple sites: Secondary | ICD-10-CM | POA: Diagnosis not present

## 2022-01-05 DIAGNOSIS — I10 Essential (primary) hypertension: Secondary | ICD-10-CM | POA: Diagnosis not present

## 2022-01-05 DIAGNOSIS — R52 Pain, unspecified: Secondary | ICD-10-CM | POA: Diagnosis not present

## 2022-01-08 DIAGNOSIS — I2582 Chronic total occlusion of coronary artery: Secondary | ICD-10-CM | POA: Diagnosis not present

## 2022-01-08 DIAGNOSIS — J441 Chronic obstructive pulmonary disease with (acute) exacerbation: Secondary | ICD-10-CM | POA: Diagnosis not present

## 2022-01-08 DIAGNOSIS — E539 Vitamin B deficiency, unspecified: Secondary | ICD-10-CM | POA: Diagnosis not present

## 2022-01-08 DIAGNOSIS — R52 Pain, unspecified: Secondary | ICD-10-CM | POA: Diagnosis not present

## 2022-01-08 DIAGNOSIS — I82409 Acute embolism and thrombosis of unspecified deep veins of unspecified lower extremity: Secondary | ICD-10-CM | POA: Diagnosis not present

## 2022-01-08 DIAGNOSIS — M6259 Muscle wasting and atrophy, not elsewhere classified, multiple sites: Secondary | ICD-10-CM | POA: Diagnosis not present

## 2022-01-08 DIAGNOSIS — I10 Essential (primary) hypertension: Secondary | ICD-10-CM | POA: Diagnosis not present

## 2022-01-08 DIAGNOSIS — E785 Hyperlipidemia, unspecified: Secondary | ICD-10-CM | POA: Diagnosis not present

## 2022-01-08 DIAGNOSIS — J9611 Chronic respiratory failure with hypoxia: Secondary | ICD-10-CM | POA: Diagnosis not present

## 2022-01-08 DIAGNOSIS — F32A Depression, unspecified: Secondary | ICD-10-CM | POA: Diagnosis not present

## 2022-01-08 DIAGNOSIS — M6281 Muscle weakness (generalized): Secondary | ICD-10-CM | POA: Diagnosis not present

## 2022-01-08 DIAGNOSIS — K219 Gastro-esophageal reflux disease without esophagitis: Secondary | ICD-10-CM | POA: Diagnosis not present

## 2022-01-08 DIAGNOSIS — I739 Peripheral vascular disease, unspecified: Secondary | ICD-10-CM | POA: Diagnosis not present

## 2022-01-09 DIAGNOSIS — I739 Peripheral vascular disease, unspecified: Secondary | ICD-10-CM | POA: Diagnosis not present

## 2022-01-09 DIAGNOSIS — K219 Gastro-esophageal reflux disease without esophagitis: Secondary | ICD-10-CM | POA: Diagnosis not present

## 2022-01-09 DIAGNOSIS — J441 Chronic obstructive pulmonary disease with (acute) exacerbation: Secondary | ICD-10-CM | POA: Diagnosis not present

## 2022-01-09 DIAGNOSIS — I2582 Chronic total occlusion of coronary artery: Secondary | ICD-10-CM | POA: Diagnosis not present

## 2022-01-09 DIAGNOSIS — E539 Vitamin B deficiency, unspecified: Secondary | ICD-10-CM | POA: Diagnosis not present

## 2022-01-09 DIAGNOSIS — I82409 Acute embolism and thrombosis of unspecified deep veins of unspecified lower extremity: Secondary | ICD-10-CM | POA: Diagnosis not present

## 2022-01-09 DIAGNOSIS — M6259 Muscle wasting and atrophy, not elsewhere classified, multiple sites: Secondary | ICD-10-CM | POA: Diagnosis not present

## 2022-01-09 DIAGNOSIS — E785 Hyperlipidemia, unspecified: Secondary | ICD-10-CM | POA: Diagnosis not present

## 2022-01-09 DIAGNOSIS — R52 Pain, unspecified: Secondary | ICD-10-CM | POA: Diagnosis not present

## 2022-01-09 DIAGNOSIS — I10 Essential (primary) hypertension: Secondary | ICD-10-CM | POA: Diagnosis not present

## 2022-01-10 DIAGNOSIS — E539 Vitamin B deficiency, unspecified: Secondary | ICD-10-CM | POA: Diagnosis not present

## 2022-01-10 DIAGNOSIS — E785 Hyperlipidemia, unspecified: Secondary | ICD-10-CM | POA: Diagnosis not present

## 2022-01-10 DIAGNOSIS — I739 Peripheral vascular disease, unspecified: Secondary | ICD-10-CM | POA: Diagnosis not present

## 2022-01-10 DIAGNOSIS — I10 Essential (primary) hypertension: Secondary | ICD-10-CM | POA: Diagnosis not present

## 2022-01-10 DIAGNOSIS — K219 Gastro-esophageal reflux disease without esophagitis: Secondary | ICD-10-CM | POA: Diagnosis not present

## 2022-01-10 DIAGNOSIS — I2582 Chronic total occlusion of coronary artery: Secondary | ICD-10-CM | POA: Diagnosis not present

## 2022-01-10 DIAGNOSIS — I82409 Acute embolism and thrombosis of unspecified deep veins of unspecified lower extremity: Secondary | ICD-10-CM | POA: Diagnosis not present

## 2022-01-10 DIAGNOSIS — J441 Chronic obstructive pulmonary disease with (acute) exacerbation: Secondary | ICD-10-CM | POA: Diagnosis not present

## 2022-01-10 DIAGNOSIS — R52 Pain, unspecified: Secondary | ICD-10-CM | POA: Diagnosis not present

## 2022-01-10 DIAGNOSIS — M6259 Muscle wasting and atrophy, not elsewhere classified, multiple sites: Secondary | ICD-10-CM | POA: Diagnosis not present

## 2022-01-11 DIAGNOSIS — I2582 Chronic total occlusion of coronary artery: Secondary | ICD-10-CM | POA: Diagnosis not present

## 2022-01-11 DIAGNOSIS — J441 Chronic obstructive pulmonary disease with (acute) exacerbation: Secondary | ICD-10-CM | POA: Diagnosis not present

## 2022-01-11 DIAGNOSIS — I10 Essential (primary) hypertension: Secondary | ICD-10-CM | POA: Diagnosis not present

## 2022-01-11 DIAGNOSIS — K219 Gastro-esophageal reflux disease without esophagitis: Secondary | ICD-10-CM | POA: Diagnosis not present

## 2022-01-11 DIAGNOSIS — E785 Hyperlipidemia, unspecified: Secondary | ICD-10-CM | POA: Diagnosis not present

## 2022-01-11 DIAGNOSIS — I82409 Acute embolism and thrombosis of unspecified deep veins of unspecified lower extremity: Secondary | ICD-10-CM | POA: Diagnosis not present

## 2022-01-11 DIAGNOSIS — R52 Pain, unspecified: Secondary | ICD-10-CM | POA: Diagnosis not present

## 2022-01-11 DIAGNOSIS — I739 Peripheral vascular disease, unspecified: Secondary | ICD-10-CM | POA: Diagnosis not present

## 2022-01-11 DIAGNOSIS — M6259 Muscle wasting and atrophy, not elsewhere classified, multiple sites: Secondary | ICD-10-CM | POA: Diagnosis not present

## 2022-01-11 DIAGNOSIS — E539 Vitamin B deficiency, unspecified: Secondary | ICD-10-CM | POA: Diagnosis not present

## 2022-01-12 DIAGNOSIS — E559 Vitamin D deficiency, unspecified: Secondary | ICD-10-CM | POA: Diagnosis not present

## 2022-01-13 DIAGNOSIS — I10 Essential (primary) hypertension: Secondary | ICD-10-CM | POA: Diagnosis not present

## 2022-01-13 DIAGNOSIS — E539 Vitamin B deficiency, unspecified: Secondary | ICD-10-CM | POA: Diagnosis not present

## 2022-01-13 DIAGNOSIS — I2582 Chronic total occlusion of coronary artery: Secondary | ICD-10-CM | POA: Diagnosis not present

## 2022-01-13 DIAGNOSIS — M6259 Muscle wasting and atrophy, not elsewhere classified, multiple sites: Secondary | ICD-10-CM | POA: Diagnosis not present

## 2022-01-13 DIAGNOSIS — I739 Peripheral vascular disease, unspecified: Secondary | ICD-10-CM | POA: Diagnosis not present

## 2022-01-13 DIAGNOSIS — E785 Hyperlipidemia, unspecified: Secondary | ICD-10-CM | POA: Diagnosis not present

## 2022-01-13 DIAGNOSIS — R52 Pain, unspecified: Secondary | ICD-10-CM | POA: Diagnosis not present

## 2022-01-13 DIAGNOSIS — J441 Chronic obstructive pulmonary disease with (acute) exacerbation: Secondary | ICD-10-CM | POA: Diagnosis not present

## 2022-01-13 DIAGNOSIS — K219 Gastro-esophageal reflux disease without esophagitis: Secondary | ICD-10-CM | POA: Diagnosis not present

## 2022-01-13 DIAGNOSIS — I82409 Acute embolism and thrombosis of unspecified deep veins of unspecified lower extremity: Secondary | ICD-10-CM | POA: Diagnosis not present

## 2022-01-15 DIAGNOSIS — I2582 Chronic total occlusion of coronary artery: Secondary | ICD-10-CM | POA: Diagnosis not present

## 2022-01-15 DIAGNOSIS — I82409 Acute embolism and thrombosis of unspecified deep veins of unspecified lower extremity: Secondary | ICD-10-CM | POA: Diagnosis not present

## 2022-01-15 DIAGNOSIS — K219 Gastro-esophageal reflux disease without esophagitis: Secondary | ICD-10-CM | POA: Diagnosis not present

## 2022-01-15 DIAGNOSIS — E785 Hyperlipidemia, unspecified: Secondary | ICD-10-CM | POA: Diagnosis not present

## 2022-01-15 DIAGNOSIS — J441 Chronic obstructive pulmonary disease with (acute) exacerbation: Secondary | ICD-10-CM | POA: Diagnosis not present

## 2022-01-15 DIAGNOSIS — I739 Peripheral vascular disease, unspecified: Secondary | ICD-10-CM | POA: Diagnosis not present

## 2022-01-15 DIAGNOSIS — M6259 Muscle wasting and atrophy, not elsewhere classified, multiple sites: Secondary | ICD-10-CM | POA: Diagnosis not present

## 2022-01-15 DIAGNOSIS — R52 Pain, unspecified: Secondary | ICD-10-CM | POA: Diagnosis not present

## 2022-01-15 DIAGNOSIS — I10 Essential (primary) hypertension: Secondary | ICD-10-CM | POA: Diagnosis not present

## 2022-01-15 DIAGNOSIS — E539 Vitamin B deficiency, unspecified: Secondary | ICD-10-CM | POA: Diagnosis not present

## 2022-01-16 DIAGNOSIS — E785 Hyperlipidemia, unspecified: Secondary | ICD-10-CM | POA: Diagnosis not present

## 2022-01-16 DIAGNOSIS — K219 Gastro-esophageal reflux disease without esophagitis: Secondary | ICD-10-CM | POA: Diagnosis not present

## 2022-01-16 DIAGNOSIS — E539 Vitamin B deficiency, unspecified: Secondary | ICD-10-CM | POA: Diagnosis not present

## 2022-01-16 DIAGNOSIS — M6259 Muscle wasting and atrophy, not elsewhere classified, multiple sites: Secondary | ICD-10-CM | POA: Diagnosis not present

## 2022-01-16 DIAGNOSIS — I82409 Acute embolism and thrombosis of unspecified deep veins of unspecified lower extremity: Secondary | ICD-10-CM | POA: Diagnosis not present

## 2022-01-16 DIAGNOSIS — I2582 Chronic total occlusion of coronary artery: Secondary | ICD-10-CM | POA: Diagnosis not present

## 2022-01-16 DIAGNOSIS — I10 Essential (primary) hypertension: Secondary | ICD-10-CM | POA: Diagnosis not present

## 2022-01-16 DIAGNOSIS — J441 Chronic obstructive pulmonary disease with (acute) exacerbation: Secondary | ICD-10-CM | POA: Diagnosis not present

## 2022-01-16 DIAGNOSIS — R52 Pain, unspecified: Secondary | ICD-10-CM | POA: Diagnosis not present

## 2022-01-16 DIAGNOSIS — I739 Peripheral vascular disease, unspecified: Secondary | ICD-10-CM | POA: Diagnosis not present

## 2022-01-17 DIAGNOSIS — J441 Chronic obstructive pulmonary disease with (acute) exacerbation: Secondary | ICD-10-CM | POA: Diagnosis not present

## 2022-01-17 DIAGNOSIS — E539 Vitamin B deficiency, unspecified: Secondary | ICD-10-CM | POA: Diagnosis not present

## 2022-01-17 DIAGNOSIS — I739 Peripheral vascular disease, unspecified: Secondary | ICD-10-CM | POA: Diagnosis not present

## 2022-01-17 DIAGNOSIS — K219 Gastro-esophageal reflux disease without esophagitis: Secondary | ICD-10-CM | POA: Diagnosis not present

## 2022-01-17 DIAGNOSIS — R52 Pain, unspecified: Secondary | ICD-10-CM | POA: Diagnosis not present

## 2022-01-17 DIAGNOSIS — M6259 Muscle wasting and atrophy, not elsewhere classified, multiple sites: Secondary | ICD-10-CM | POA: Diagnosis not present

## 2022-01-17 DIAGNOSIS — I2582 Chronic total occlusion of coronary artery: Secondary | ICD-10-CM | POA: Diagnosis not present

## 2022-01-17 DIAGNOSIS — I10 Essential (primary) hypertension: Secondary | ICD-10-CM | POA: Diagnosis not present

## 2022-01-17 DIAGNOSIS — E785 Hyperlipidemia, unspecified: Secondary | ICD-10-CM | POA: Diagnosis not present

## 2022-01-17 DIAGNOSIS — I82409 Acute embolism and thrombosis of unspecified deep veins of unspecified lower extremity: Secondary | ICD-10-CM | POA: Diagnosis not present

## 2022-01-18 DIAGNOSIS — I2582 Chronic total occlusion of coronary artery: Secondary | ICD-10-CM | POA: Diagnosis not present

## 2022-01-18 DIAGNOSIS — I10 Essential (primary) hypertension: Secondary | ICD-10-CM | POA: Diagnosis not present

## 2022-01-18 DIAGNOSIS — E785 Hyperlipidemia, unspecified: Secondary | ICD-10-CM | POA: Diagnosis not present

## 2022-01-18 DIAGNOSIS — M6259 Muscle wasting and atrophy, not elsewhere classified, multiple sites: Secondary | ICD-10-CM | POA: Diagnosis not present

## 2022-01-18 DIAGNOSIS — E539 Vitamin B deficiency, unspecified: Secondary | ICD-10-CM | POA: Diagnosis not present

## 2022-01-18 DIAGNOSIS — R52 Pain, unspecified: Secondary | ICD-10-CM | POA: Diagnosis not present

## 2022-01-18 DIAGNOSIS — K219 Gastro-esophageal reflux disease without esophagitis: Secondary | ICD-10-CM | POA: Diagnosis not present

## 2022-01-18 DIAGNOSIS — I739 Peripheral vascular disease, unspecified: Secondary | ICD-10-CM | POA: Diagnosis not present

## 2022-01-18 DIAGNOSIS — I82409 Acute embolism and thrombosis of unspecified deep veins of unspecified lower extremity: Secondary | ICD-10-CM | POA: Diagnosis not present

## 2022-01-18 DIAGNOSIS — J441 Chronic obstructive pulmonary disease with (acute) exacerbation: Secondary | ICD-10-CM | POA: Diagnosis not present

## 2022-01-19 DIAGNOSIS — I739 Peripheral vascular disease, unspecified: Secondary | ICD-10-CM | POA: Diagnosis not present

## 2022-01-19 DIAGNOSIS — J441 Chronic obstructive pulmonary disease with (acute) exacerbation: Secondary | ICD-10-CM | POA: Diagnosis not present

## 2022-01-19 DIAGNOSIS — R52 Pain, unspecified: Secondary | ICD-10-CM | POA: Diagnosis not present

## 2022-01-19 DIAGNOSIS — I82409 Acute embolism and thrombosis of unspecified deep veins of unspecified lower extremity: Secondary | ICD-10-CM | POA: Diagnosis not present

## 2022-01-19 DIAGNOSIS — I10 Essential (primary) hypertension: Secondary | ICD-10-CM | POA: Diagnosis not present

## 2022-01-19 DIAGNOSIS — K219 Gastro-esophageal reflux disease without esophagitis: Secondary | ICD-10-CM | POA: Diagnosis not present

## 2022-01-19 DIAGNOSIS — I2582 Chronic total occlusion of coronary artery: Secondary | ICD-10-CM | POA: Diagnosis not present

## 2022-01-19 DIAGNOSIS — M6259 Muscle wasting and atrophy, not elsewhere classified, multiple sites: Secondary | ICD-10-CM | POA: Diagnosis not present

## 2022-01-19 DIAGNOSIS — E785 Hyperlipidemia, unspecified: Secondary | ICD-10-CM | POA: Diagnosis not present

## 2022-01-19 DIAGNOSIS — E539 Vitamin B deficiency, unspecified: Secondary | ICD-10-CM | POA: Diagnosis not present

## 2022-01-22 DIAGNOSIS — K219 Gastro-esophageal reflux disease without esophagitis: Secondary | ICD-10-CM | POA: Diagnosis not present

## 2022-01-22 DIAGNOSIS — J441 Chronic obstructive pulmonary disease with (acute) exacerbation: Secondary | ICD-10-CM | POA: Diagnosis not present

## 2022-01-22 DIAGNOSIS — I82409 Acute embolism and thrombosis of unspecified deep veins of unspecified lower extremity: Secondary | ICD-10-CM | POA: Diagnosis not present

## 2022-01-22 DIAGNOSIS — M6259 Muscle wasting and atrophy, not elsewhere classified, multiple sites: Secondary | ICD-10-CM | POA: Diagnosis not present

## 2022-01-22 DIAGNOSIS — R52 Pain, unspecified: Secondary | ICD-10-CM | POA: Diagnosis not present

## 2022-01-22 DIAGNOSIS — I2582 Chronic total occlusion of coronary artery: Secondary | ICD-10-CM | POA: Diagnosis not present

## 2022-01-22 DIAGNOSIS — E785 Hyperlipidemia, unspecified: Secondary | ICD-10-CM | POA: Diagnosis not present

## 2022-01-22 DIAGNOSIS — I10 Essential (primary) hypertension: Secondary | ICD-10-CM | POA: Diagnosis not present

## 2022-01-22 DIAGNOSIS — I739 Peripheral vascular disease, unspecified: Secondary | ICD-10-CM | POA: Diagnosis not present

## 2022-01-22 DIAGNOSIS — E539 Vitamin B deficiency, unspecified: Secondary | ICD-10-CM | POA: Diagnosis not present

## 2022-01-23 DIAGNOSIS — I10 Essential (primary) hypertension: Secondary | ICD-10-CM | POA: Diagnosis not present

## 2022-01-23 DIAGNOSIS — K219 Gastro-esophageal reflux disease without esophagitis: Secondary | ICD-10-CM | POA: Diagnosis not present

## 2022-01-23 DIAGNOSIS — I2582 Chronic total occlusion of coronary artery: Secondary | ICD-10-CM | POA: Diagnosis not present

## 2022-01-23 DIAGNOSIS — M6259 Muscle wasting and atrophy, not elsewhere classified, multiple sites: Secondary | ICD-10-CM | POA: Diagnosis not present

## 2022-01-23 DIAGNOSIS — E539 Vitamin B deficiency, unspecified: Secondary | ICD-10-CM | POA: Diagnosis not present

## 2022-01-23 DIAGNOSIS — J441 Chronic obstructive pulmonary disease with (acute) exacerbation: Secondary | ICD-10-CM | POA: Diagnosis not present

## 2022-01-23 DIAGNOSIS — E785 Hyperlipidemia, unspecified: Secondary | ICD-10-CM | POA: Diagnosis not present

## 2022-01-23 DIAGNOSIS — R52 Pain, unspecified: Secondary | ICD-10-CM | POA: Diagnosis not present

## 2022-01-23 DIAGNOSIS — I82409 Acute embolism and thrombosis of unspecified deep veins of unspecified lower extremity: Secondary | ICD-10-CM | POA: Diagnosis not present

## 2022-01-23 DIAGNOSIS — I739 Peripheral vascular disease, unspecified: Secondary | ICD-10-CM | POA: Diagnosis not present

## 2022-01-24 DIAGNOSIS — E785 Hyperlipidemia, unspecified: Secondary | ICD-10-CM | POA: Diagnosis not present

## 2022-01-24 DIAGNOSIS — I82409 Acute embolism and thrombosis of unspecified deep veins of unspecified lower extremity: Secondary | ICD-10-CM | POA: Diagnosis not present

## 2022-01-24 DIAGNOSIS — J441 Chronic obstructive pulmonary disease with (acute) exacerbation: Secondary | ICD-10-CM | POA: Diagnosis not present

## 2022-01-24 DIAGNOSIS — E539 Vitamin B deficiency, unspecified: Secondary | ICD-10-CM | POA: Diagnosis not present

## 2022-01-24 DIAGNOSIS — R52 Pain, unspecified: Secondary | ICD-10-CM | POA: Diagnosis not present

## 2022-01-24 DIAGNOSIS — M6259 Muscle wasting and atrophy, not elsewhere classified, multiple sites: Secondary | ICD-10-CM | POA: Diagnosis not present

## 2022-01-24 DIAGNOSIS — I2582 Chronic total occlusion of coronary artery: Secondary | ICD-10-CM | POA: Diagnosis not present

## 2022-01-24 DIAGNOSIS — I739 Peripheral vascular disease, unspecified: Secondary | ICD-10-CM | POA: Diagnosis not present

## 2022-01-24 DIAGNOSIS — I10 Essential (primary) hypertension: Secondary | ICD-10-CM | POA: Diagnosis not present

## 2022-01-24 DIAGNOSIS — K219 Gastro-esophageal reflux disease without esophagitis: Secondary | ICD-10-CM | POA: Diagnosis not present

## 2022-01-25 DIAGNOSIS — E785 Hyperlipidemia, unspecified: Secondary | ICD-10-CM | POA: Diagnosis not present

## 2022-01-25 DIAGNOSIS — M6259 Muscle wasting and atrophy, not elsewhere classified, multiple sites: Secondary | ICD-10-CM | POA: Diagnosis not present

## 2022-01-25 DIAGNOSIS — R52 Pain, unspecified: Secondary | ICD-10-CM | POA: Diagnosis not present

## 2022-01-25 DIAGNOSIS — I2582 Chronic total occlusion of coronary artery: Secondary | ICD-10-CM | POA: Diagnosis not present

## 2022-01-25 DIAGNOSIS — J441 Chronic obstructive pulmonary disease with (acute) exacerbation: Secondary | ICD-10-CM | POA: Diagnosis not present

## 2022-01-25 DIAGNOSIS — I10 Essential (primary) hypertension: Secondary | ICD-10-CM | POA: Diagnosis not present

## 2022-01-25 DIAGNOSIS — I739 Peripheral vascular disease, unspecified: Secondary | ICD-10-CM | POA: Diagnosis not present

## 2022-01-25 DIAGNOSIS — K219 Gastro-esophageal reflux disease without esophagitis: Secondary | ICD-10-CM | POA: Diagnosis not present

## 2022-01-25 DIAGNOSIS — E539 Vitamin B deficiency, unspecified: Secondary | ICD-10-CM | POA: Diagnosis not present

## 2022-01-25 DIAGNOSIS — I82409 Acute embolism and thrombosis of unspecified deep veins of unspecified lower extremity: Secondary | ICD-10-CM | POA: Diagnosis not present

## 2022-01-26 DIAGNOSIS — I2582 Chronic total occlusion of coronary artery: Secondary | ICD-10-CM | POA: Diagnosis not present

## 2022-01-26 DIAGNOSIS — I10 Essential (primary) hypertension: Secondary | ICD-10-CM | POA: Diagnosis not present

## 2022-01-26 DIAGNOSIS — I82409 Acute embolism and thrombosis of unspecified deep veins of unspecified lower extremity: Secondary | ICD-10-CM | POA: Diagnosis not present

## 2022-01-26 DIAGNOSIS — I739 Peripheral vascular disease, unspecified: Secondary | ICD-10-CM | POA: Diagnosis not present

## 2022-01-26 DIAGNOSIS — E539 Vitamin B deficiency, unspecified: Secondary | ICD-10-CM | POA: Diagnosis not present

## 2022-01-26 DIAGNOSIS — J441 Chronic obstructive pulmonary disease with (acute) exacerbation: Secondary | ICD-10-CM | POA: Diagnosis not present

## 2022-01-26 DIAGNOSIS — E785 Hyperlipidemia, unspecified: Secondary | ICD-10-CM | POA: Diagnosis not present

## 2022-01-26 DIAGNOSIS — M6259 Muscle wasting and atrophy, not elsewhere classified, multiple sites: Secondary | ICD-10-CM | POA: Diagnosis not present

## 2022-01-26 DIAGNOSIS — R52 Pain, unspecified: Secondary | ICD-10-CM | POA: Diagnosis not present

## 2022-01-26 DIAGNOSIS — K219 Gastro-esophageal reflux disease without esophagitis: Secondary | ICD-10-CM | POA: Diagnosis not present

## 2022-01-29 DIAGNOSIS — R52 Pain, unspecified: Secondary | ICD-10-CM | POA: Diagnosis not present

## 2022-01-29 DIAGNOSIS — K219 Gastro-esophageal reflux disease without esophagitis: Secondary | ICD-10-CM | POA: Diagnosis not present

## 2022-01-29 DIAGNOSIS — M6259 Muscle wasting and atrophy, not elsewhere classified, multiple sites: Secondary | ICD-10-CM | POA: Diagnosis not present

## 2022-01-29 DIAGNOSIS — I10 Essential (primary) hypertension: Secondary | ICD-10-CM | POA: Diagnosis not present

## 2022-01-29 DIAGNOSIS — E785 Hyperlipidemia, unspecified: Secondary | ICD-10-CM | POA: Diagnosis not present

## 2022-01-29 DIAGNOSIS — I82409 Acute embolism and thrombosis of unspecified deep veins of unspecified lower extremity: Secondary | ICD-10-CM | POA: Diagnosis not present

## 2022-01-29 DIAGNOSIS — J441 Chronic obstructive pulmonary disease with (acute) exacerbation: Secondary | ICD-10-CM | POA: Diagnosis not present

## 2022-01-29 DIAGNOSIS — I739 Peripheral vascular disease, unspecified: Secondary | ICD-10-CM | POA: Diagnosis not present

## 2022-01-29 DIAGNOSIS — E539 Vitamin B deficiency, unspecified: Secondary | ICD-10-CM | POA: Diagnosis not present

## 2022-01-29 DIAGNOSIS — I2582 Chronic total occlusion of coronary artery: Secondary | ICD-10-CM | POA: Diagnosis not present

## 2022-02-06 ENCOUNTER — Encounter: Payer: Self-pay | Admitting: Nurse Practitioner

## 2022-02-06 ENCOUNTER — Non-Acute Institutional Stay: Payer: Medicare Other | Admitting: Nurse Practitioner

## 2022-02-06 DIAGNOSIS — I509 Heart failure, unspecified: Secondary | ICD-10-CM

## 2022-02-06 DIAGNOSIS — R5381 Other malaise: Secondary | ICD-10-CM | POA: Diagnosis not present

## 2022-02-06 DIAGNOSIS — J449 Chronic obstructive pulmonary disease, unspecified: Secondary | ICD-10-CM | POA: Diagnosis not present

## 2022-02-06 DIAGNOSIS — Z515 Encounter for palliative care: Secondary | ICD-10-CM

## 2022-02-06 DIAGNOSIS — R0602 Shortness of breath: Secondary | ICD-10-CM

## 2022-02-06 NOTE — Progress Notes (Signed)
[facility] Palliative Care Consult Note Telephone: [phone]  Fax: [phone]    Date of encounter: 02/06/22 5:37 PM PATIENT NAME: Anthony Chambers  [id number]   [phone] (home)  DOB: [birth year] MRN: [id number] PRIMARY CARE PROVIDER:    [facility] Rica Koyanagi, MD,  [place] [address] [phone]   RESPONSIBLE PARTY:    Contact Information       Name Relation Home Work Mobile    Anthony Chambers Sister [phone] [place] Relative     Anthony Chambers [phone]        [place] [phone]           I met face to face with patient in facility. Palliative Care was asked to follow this patient by consultation request of  Rica Koyanagi, MD /[facility] to address advance care planning and complex medical decision making. This is a follow up visit.                                  ASSESSMENT AND PLAN / RECOMMENDATIONS:  Symptom Management/Plan: 1. Advance Care Planning;     2. Goals of Care: Goals include to maximize quality of life and symptom management. Our advance care planning conversation included a discussion about:    The value and importance of advance care planning  Exploration of personal, cultural or spiritual beliefs that might influence medical decisions  Exploration of goals of care in the event of a sudden injury or illness  Identification and preparation of a healthcare agent  Review and updating or creation of an advance directive document. 3. Palliative care encounter; Palliative care encounter; Palliative medicine team will continue to support patient, patient's family, and medical team. Visit consisted of counseling and education dealing with the complex and emotionally intense issues of symptom management and palliative care in the setting of serious and potentially life-threatening illness   4.  Shortness of breath secondary to COPD, Chronic diastolic heart failure with O2 dependent; monitor respiratory status, weights, continue current medications reviewed, continue on lasix, monitor routine labs   5. Debility with generalized weakness secondary to co-morbidities, overall decline. Encourage mobility, oob, fall risk, socialization.    Follow up Palliative Care Visit: Palliative care will continue to follow for complex medical decision making, advance care planning, and clarification of goals. Return 4 to 8 weeks or prn.   I spent 47 minutes providing this consultation starting at 10:00 am. More than 50% of the time in this consultation was spent in counseling and care coordination. PPS: 50% Chief Complaint: Follow up palliative consult for complex medical decision making, address goals, manage ongoing symptoms   HISTORY OF PRESENT ILLNESS:  Anthony Chambers is a 77 y.o. year old male  with multiple medical problems including PVD, h/o DVT, h/o popliteal artery aneurysm, chronic diastolic heart failure, COPD, chronic respiratory failure with hypoxia and hypercapnia, GERD, HLD. Mr Wager resides at [facility]. Mr Tetterton requires assistance for transfers, mobility, adl's including bathing, dressing, tray set up. Mr Swire does feed himself with good appetite. At present Mr Collard is sitting in the w/c in his room, O2 supplemental, appears comfortable.   No new changes today. Medical goals, medications and poc reviewed. Therapeutic listening emotional support provided. Questions answered.    History obtained from review of  EMR, discussion with primary team, and interview with family, facility staff/caregiver and/or Mr. Gurry.  I reviewed available labs, medications, imaging, studies and related documents from the EMR.  Records reviewed and summarized above.  Physical Exam: Constitutional: NAD General: debilitated, pleasant male ENMT: oral mucous membranes moist CV: S1S2, RRR, +BLE  edema Pulmonary: breath sounds decreased bases, otherwise clear, O2 supplemental Abdomen: normo-active BS + 4 quadrants, soft and non tender MSK: w/c dependent Skin: warm and dry Neuro:  + generalized weakness Psych: non-anxious affect, A and oriented  Thank you for the opportunity to participate in the care of Mr. Jakes. Please call our office at [phone] if we can be of additional assistance.   Fredricka Kohrs Ihor Gully, NP

## 2022-03-05 DIAGNOSIS — E559 Vitamin D deficiency, unspecified: Secondary | ICD-10-CM | POA: Diagnosis not present

## 2022-03-05 DIAGNOSIS — E119 Type 2 diabetes mellitus without complications: Secondary | ICD-10-CM | POA: Diagnosis not present

## 2022-03-05 DIAGNOSIS — F32A Depression, unspecified: Secondary | ICD-10-CM | POA: Diagnosis not present

## 2022-03-05 DIAGNOSIS — J9611 Chronic respiratory failure with hypoxia: Secondary | ICD-10-CM | POA: Diagnosis not present

## 2022-03-05 DIAGNOSIS — I10 Essential (primary) hypertension: Secondary | ICD-10-CM | POA: Diagnosis not present

## 2022-03-15 ENCOUNTER — Encounter: Payer: Self-pay | Admitting: Nurse Practitioner

## 2022-03-15 ENCOUNTER — Non-Acute Institutional Stay: Payer: Medicare Other | Admitting: Nurse Practitioner

## 2022-03-15 DIAGNOSIS — R0602 Shortness of breath: Secondary | ICD-10-CM | POA: Diagnosis not present

## 2022-03-15 DIAGNOSIS — Z515 Encounter for palliative care: Secondary | ICD-10-CM | POA: Diagnosis not present

## 2022-03-15 DIAGNOSIS — J449 Chronic obstructive pulmonary disease, unspecified: Secondary | ICD-10-CM | POA: Diagnosis not present

## 2022-03-15 DIAGNOSIS — R5381 Other malaise: Secondary | ICD-10-CM

## 2022-03-15 DIAGNOSIS — I509 Heart failure, unspecified: Secondary | ICD-10-CM | POA: Diagnosis not present

## 2022-03-15 NOTE — Progress Notes (Signed)
[facility] Palliative Care Consult Note Telephone: [phone]  Fax: [phone]    Date of encounter: 03/15/22 5:50 PM PATIENT NAME: Anthony Chambers [address] [place] [address]   [phone] (home)  DOB: 24-Aug-1945 MRN: [id number] PRIMARY CARE PROVIDER:    [facility]  RESPONSIBLE PARTY:    Contact Information     Name Relation Home Work Mobile   Eddie Candle Sister [phone] [place] Relative   [place] [phone]     [place] [phone]      I met face to face with patient in facility. Palliative Care was asked to follow this patient by consultation request of  Rica Koyanagi, MD /[facility] to address advance care planning and complex medical decision making. This is a follow up visit.                                  ASSESSMENT AND PLAN / RECOMMENDATIONS:  Symptom Management/Plan: 1. Advance Care Planning;     2. Goals of Care: Goals include to maximize quality of life and symptom management. Our advance care planning conversation included a discussion about:    The value and importance of advance care planning  Exploration of personal, cultural or spiritual beliefs that might influence medical decisions  Exploration of goals of care in the event of a sudden injury or illness  Identification and preparation of a healthcare agent  Review and updating or creation of an advance directive document. 3. Palliative care encounter; Palliative care encounter; Palliative medicine team will continue to support patient, patient's family, and medical team. Visit consisted of counseling and education dealing with the complex and emotionally intense issues of symptom management and palliative care in the setting of serious and potentially life-threatening illness   4. Shortness of breath secondary to COPD, Chronic diastolic heart failure with O2 dependent;  monitor respiratory status, weights, continue current medications reviewed, continue on lasix, monitor routine labs; stable, no recent exacerbation. Discussed chronic disease mgt, education done  03/14/2022 weight 227.4 lbs   5. Debility with generalized weakness secondary to co-morbidities, overall decline. Encourage mobility, oob, fall risk, socialization.    Follow up Palliative Care Visit: Palliative care will continue to follow for complex medical decision making, advance care planning, and clarification of goals. Return 4 to 8 weeks or prn.   I spent 45 minutes providing this consultation. More than 50% of the time in this consultation was spent in counseling and care coordination. PPS: 50% Chief Complaint: Follow up palliative consult for complex medical decision making, address goals, manage ongoing symptoms   HISTORY OF PRESENT ILLNESS:  ESMERALDA Chambers is a 77 y.o. year old male  with multiple medical problems including PVD, h/o DVT, h/o popliteal artery aneurysm, chronic diastolic heart failure, COPD, chronic respiratory failure with hypoxia and hypercapnia, GERD, HLD. Anthony Chambers resides at [place]. Anthony Chambers requires assistance for transfers, mobility, adl's including bathing, dressing, tray set up. Anthony Chambers does feed himself with good appetite. Purpose of today PC f/u visit further discussion monitor trends of appetite, weights, monitor for functional, cognitive decline with chronic disease progression, assess any active symptoms, supportive role. At present Anthony Chambers is sitting in the w/c in his room, O2 supplemental, appears comfortable. We talked about how he has been feeling, "good". Anthony Chambers endorses he sits in front  of his window and watches the scenery. We talked about ros concentrating on sob, appetite, reviewed weights, foods he likes. We talked about importance of mobility, oob and daily routine. We talked about quality of life. We talked about role pc in poc.  Supportive  visit. No new changes today. Medical goals, medications and poc reviewed. Therapeutic listening emotional support provided. Questions answered. PC f/u visit further discussion monitor trends of appetite, weights, monitor for functional, cognitive decline with chronic disease progression, assess any active symptoms, supportive role.   History obtained from review of EMR, discussion with primary team, and interview with family, facility staff/caregiver and/or Anthony. Chambers.  I reviewed available labs, medications, imaging, studies and related documents from the EMR.  Records reviewed and summarized above.  Physical Exam: General: debilitated, pleasant male, engaging ENMT: oral mucous membranes moist CV: S1S2, RRR, +BLE edema Pulmonary: breath sounds decreased bases, otherwise clear, O2 supplemental MSK: w/c dependent Psych: non-anxious affect, A and oriented   Thank you for the opportunity to participate in the care of Anthony. Chambers. Please call our office at [phone] if we can be of additional assistance.   Rainy Rothman Ihor Gully, NP

## 2022-03-20 DIAGNOSIS — I2582 Chronic total occlusion of coronary artery: Secondary | ICD-10-CM | POA: Diagnosis not present

## 2022-03-20 DIAGNOSIS — E539 Vitamin B deficiency, unspecified: Secondary | ICD-10-CM | POA: Diagnosis not present

## 2022-03-20 DIAGNOSIS — K219 Gastro-esophageal reflux disease without esophagitis: Secondary | ICD-10-CM | POA: Diagnosis not present

## 2022-03-20 DIAGNOSIS — Z736 Limitation of activities due to disability: Secondary | ICD-10-CM | POA: Diagnosis not present

## 2022-03-20 DIAGNOSIS — I739 Peripheral vascular disease, unspecified: Secondary | ICD-10-CM | POA: Diagnosis not present

## 2022-03-20 DIAGNOSIS — I10 Essential (primary) hypertension: Secondary | ICD-10-CM | POA: Diagnosis not present

## 2022-03-20 DIAGNOSIS — R52 Pain, unspecified: Secondary | ICD-10-CM | POA: Diagnosis not present

## 2022-03-20 DIAGNOSIS — E785 Hyperlipidemia, unspecified: Secondary | ICD-10-CM | POA: Diagnosis not present

## 2022-03-20 DIAGNOSIS — M6259 Muscle wasting and atrophy, not elsewhere classified, multiple sites: Secondary | ICD-10-CM | POA: Diagnosis not present

## 2022-03-20 DIAGNOSIS — J441 Chronic obstructive pulmonary disease with (acute) exacerbation: Secondary | ICD-10-CM | POA: Diagnosis not present

## 2022-03-21 DIAGNOSIS — J441 Chronic obstructive pulmonary disease with (acute) exacerbation: Secondary | ICD-10-CM | POA: Diagnosis not present

## 2022-03-21 DIAGNOSIS — I739 Peripheral vascular disease, unspecified: Secondary | ICD-10-CM | POA: Diagnosis not present

## 2022-03-21 DIAGNOSIS — M6259 Muscle wasting and atrophy, not elsewhere classified, multiple sites: Secondary | ICD-10-CM | POA: Diagnosis not present

## 2022-03-21 DIAGNOSIS — I10 Essential (primary) hypertension: Secondary | ICD-10-CM | POA: Diagnosis not present

## 2022-03-21 DIAGNOSIS — R52 Pain, unspecified: Secondary | ICD-10-CM | POA: Diagnosis not present

## 2022-03-21 DIAGNOSIS — K219 Gastro-esophageal reflux disease without esophagitis: Secondary | ICD-10-CM | POA: Diagnosis not present

## 2022-03-21 DIAGNOSIS — E785 Hyperlipidemia, unspecified: Secondary | ICD-10-CM | POA: Diagnosis not present

## 2022-03-21 DIAGNOSIS — Z736 Limitation of activities due to disability: Secondary | ICD-10-CM | POA: Diagnosis not present

## 2022-03-21 DIAGNOSIS — I2582 Chronic total occlusion of coronary artery: Secondary | ICD-10-CM | POA: Diagnosis not present

## 2022-03-21 DIAGNOSIS — E539 Vitamin B deficiency, unspecified: Secondary | ICD-10-CM | POA: Diagnosis not present

## 2022-03-22 DIAGNOSIS — R52 Pain, unspecified: Secondary | ICD-10-CM | POA: Diagnosis not present

## 2022-03-22 DIAGNOSIS — I10 Essential (primary) hypertension: Secondary | ICD-10-CM | POA: Diagnosis not present

## 2022-03-22 DIAGNOSIS — J441 Chronic obstructive pulmonary disease with (acute) exacerbation: Secondary | ICD-10-CM | POA: Diagnosis not present

## 2022-03-22 DIAGNOSIS — E539 Vitamin B deficiency, unspecified: Secondary | ICD-10-CM | POA: Diagnosis not present

## 2022-03-22 DIAGNOSIS — I739 Peripheral vascular disease, unspecified: Secondary | ICD-10-CM | POA: Diagnosis not present

## 2022-03-22 DIAGNOSIS — Z736 Limitation of activities due to disability: Secondary | ICD-10-CM | POA: Diagnosis not present

## 2022-03-22 DIAGNOSIS — I2582 Chronic total occlusion of coronary artery: Secondary | ICD-10-CM | POA: Diagnosis not present

## 2022-03-22 DIAGNOSIS — M6259 Muscle wasting and atrophy, not elsewhere classified, multiple sites: Secondary | ICD-10-CM | POA: Diagnosis not present

## 2022-03-22 DIAGNOSIS — K219 Gastro-esophageal reflux disease without esophagitis: Secondary | ICD-10-CM | POA: Diagnosis not present

## 2022-03-22 DIAGNOSIS — E785 Hyperlipidemia, unspecified: Secondary | ICD-10-CM | POA: Diagnosis not present

## 2022-03-23 DIAGNOSIS — R52 Pain, unspecified: Secondary | ICD-10-CM | POA: Diagnosis not present

## 2022-03-23 DIAGNOSIS — I739 Peripheral vascular disease, unspecified: Secondary | ICD-10-CM | POA: Diagnosis not present

## 2022-03-23 DIAGNOSIS — I2582 Chronic total occlusion of coronary artery: Secondary | ICD-10-CM | POA: Diagnosis not present

## 2022-03-23 DIAGNOSIS — J441 Chronic obstructive pulmonary disease with (acute) exacerbation: Secondary | ICD-10-CM | POA: Diagnosis not present

## 2022-03-23 DIAGNOSIS — M6259 Muscle wasting and atrophy, not elsewhere classified, multiple sites: Secondary | ICD-10-CM | POA: Diagnosis not present

## 2022-03-23 DIAGNOSIS — E539 Vitamin B deficiency, unspecified: Secondary | ICD-10-CM | POA: Diagnosis not present

## 2022-03-23 DIAGNOSIS — K219 Gastro-esophageal reflux disease without esophagitis: Secondary | ICD-10-CM | POA: Diagnosis not present

## 2022-03-23 DIAGNOSIS — E785 Hyperlipidemia, unspecified: Secondary | ICD-10-CM | POA: Diagnosis not present

## 2022-03-23 DIAGNOSIS — I10 Essential (primary) hypertension: Secondary | ICD-10-CM | POA: Diagnosis not present

## 2022-03-23 DIAGNOSIS — Z736 Limitation of activities due to disability: Secondary | ICD-10-CM | POA: Diagnosis not present

## 2022-03-26 DIAGNOSIS — Z736 Limitation of activities due to disability: Secondary | ICD-10-CM | POA: Diagnosis not present

## 2022-03-26 DIAGNOSIS — E785 Hyperlipidemia, unspecified: Secondary | ICD-10-CM | POA: Diagnosis not present

## 2022-03-26 DIAGNOSIS — R52 Pain, unspecified: Secondary | ICD-10-CM | POA: Diagnosis not present

## 2022-03-26 DIAGNOSIS — E539 Vitamin B deficiency, unspecified: Secondary | ICD-10-CM | POA: Diagnosis not present

## 2022-03-26 DIAGNOSIS — M6259 Muscle wasting and atrophy, not elsewhere classified, multiple sites: Secondary | ICD-10-CM | POA: Diagnosis not present

## 2022-03-26 DIAGNOSIS — J441 Chronic obstructive pulmonary disease with (acute) exacerbation: Secondary | ICD-10-CM | POA: Diagnosis not present

## 2022-03-26 DIAGNOSIS — I2582 Chronic total occlusion of coronary artery: Secondary | ICD-10-CM | POA: Diagnosis not present

## 2022-03-26 DIAGNOSIS — I10 Essential (primary) hypertension: Secondary | ICD-10-CM | POA: Diagnosis not present

## 2022-03-26 DIAGNOSIS — I739 Peripheral vascular disease, unspecified: Secondary | ICD-10-CM | POA: Diagnosis not present

## 2022-03-26 DIAGNOSIS — K219 Gastro-esophageal reflux disease without esophagitis: Secondary | ICD-10-CM | POA: Diagnosis not present

## 2022-03-27 DIAGNOSIS — Z736 Limitation of activities due to disability: Secondary | ICD-10-CM | POA: Diagnosis not present

## 2022-03-27 DIAGNOSIS — I2582 Chronic total occlusion of coronary artery: Secondary | ICD-10-CM | POA: Diagnosis not present

## 2022-03-27 DIAGNOSIS — E539 Vitamin B deficiency, unspecified: Secondary | ICD-10-CM | POA: Diagnosis not present

## 2022-03-27 DIAGNOSIS — E785 Hyperlipidemia, unspecified: Secondary | ICD-10-CM | POA: Diagnosis not present

## 2022-03-27 DIAGNOSIS — I10 Essential (primary) hypertension: Secondary | ICD-10-CM | POA: Diagnosis not present

## 2022-03-27 DIAGNOSIS — R52 Pain, unspecified: Secondary | ICD-10-CM | POA: Diagnosis not present

## 2022-03-27 DIAGNOSIS — I739 Peripheral vascular disease, unspecified: Secondary | ICD-10-CM | POA: Diagnosis not present

## 2022-03-27 DIAGNOSIS — M6259 Muscle wasting and atrophy, not elsewhere classified, multiple sites: Secondary | ICD-10-CM | POA: Diagnosis not present

## 2022-03-27 DIAGNOSIS — K219 Gastro-esophageal reflux disease without esophagitis: Secondary | ICD-10-CM | POA: Diagnosis not present

## 2022-03-27 DIAGNOSIS — J441 Chronic obstructive pulmonary disease with (acute) exacerbation: Secondary | ICD-10-CM | POA: Diagnosis not present

## 2022-03-28 DIAGNOSIS — E539 Vitamin B deficiency, unspecified: Secondary | ICD-10-CM | POA: Diagnosis not present

## 2022-03-28 DIAGNOSIS — I10 Essential (primary) hypertension: Secondary | ICD-10-CM | POA: Diagnosis not present

## 2022-03-28 DIAGNOSIS — Z736 Limitation of activities due to disability: Secondary | ICD-10-CM | POA: Diagnosis not present

## 2022-03-28 DIAGNOSIS — J441 Chronic obstructive pulmonary disease with (acute) exacerbation: Secondary | ICD-10-CM | POA: Diagnosis not present

## 2022-03-28 DIAGNOSIS — K219 Gastro-esophageal reflux disease without esophagitis: Secondary | ICD-10-CM | POA: Diagnosis not present

## 2022-03-28 DIAGNOSIS — M6259 Muscle wasting and atrophy, not elsewhere classified, multiple sites: Secondary | ICD-10-CM | POA: Diagnosis not present

## 2022-03-28 DIAGNOSIS — R52 Pain, unspecified: Secondary | ICD-10-CM | POA: Diagnosis not present

## 2022-03-28 DIAGNOSIS — E785 Hyperlipidemia, unspecified: Secondary | ICD-10-CM | POA: Diagnosis not present

## 2022-03-28 DIAGNOSIS — I739 Peripheral vascular disease, unspecified: Secondary | ICD-10-CM | POA: Diagnosis not present

## 2022-03-28 DIAGNOSIS — I2582 Chronic total occlusion of coronary artery: Secondary | ICD-10-CM | POA: Diagnosis not present

## 2022-03-29 DIAGNOSIS — Z736 Limitation of activities due to disability: Secondary | ICD-10-CM | POA: Diagnosis not present

## 2022-03-29 DIAGNOSIS — I739 Peripheral vascular disease, unspecified: Secondary | ICD-10-CM | POA: Diagnosis not present

## 2022-03-29 DIAGNOSIS — E539 Vitamin B deficiency, unspecified: Secondary | ICD-10-CM | POA: Diagnosis not present

## 2022-03-29 DIAGNOSIS — J441 Chronic obstructive pulmonary disease with (acute) exacerbation: Secondary | ICD-10-CM | POA: Diagnosis not present

## 2022-03-29 DIAGNOSIS — E785 Hyperlipidemia, unspecified: Secondary | ICD-10-CM | POA: Diagnosis not present

## 2022-03-29 DIAGNOSIS — K219 Gastro-esophageal reflux disease without esophagitis: Secondary | ICD-10-CM | POA: Diagnosis not present

## 2022-03-29 DIAGNOSIS — I2582 Chronic total occlusion of coronary artery: Secondary | ICD-10-CM | POA: Diagnosis not present

## 2022-03-29 DIAGNOSIS — R52 Pain, unspecified: Secondary | ICD-10-CM | POA: Diagnosis not present

## 2022-03-29 DIAGNOSIS — M6259 Muscle wasting and atrophy, not elsewhere classified, multiple sites: Secondary | ICD-10-CM | POA: Diagnosis not present

## 2022-03-29 DIAGNOSIS — I10 Essential (primary) hypertension: Secondary | ICD-10-CM | POA: Diagnosis not present

## 2022-03-30 DIAGNOSIS — K219 Gastro-esophageal reflux disease without esophagitis: Secondary | ICD-10-CM | POA: Diagnosis not present

## 2022-03-30 DIAGNOSIS — E539 Vitamin B deficiency, unspecified: Secondary | ICD-10-CM | POA: Diagnosis not present

## 2022-03-30 DIAGNOSIS — I2582 Chronic total occlusion of coronary artery: Secondary | ICD-10-CM | POA: Diagnosis not present

## 2022-03-30 DIAGNOSIS — Z736 Limitation of activities due to disability: Secondary | ICD-10-CM | POA: Diagnosis not present

## 2022-03-30 DIAGNOSIS — I739 Peripheral vascular disease, unspecified: Secondary | ICD-10-CM | POA: Diagnosis not present

## 2022-03-30 DIAGNOSIS — R52 Pain, unspecified: Secondary | ICD-10-CM | POA: Diagnosis not present

## 2022-03-30 DIAGNOSIS — I10 Essential (primary) hypertension: Secondary | ICD-10-CM | POA: Diagnosis not present

## 2022-03-30 DIAGNOSIS — M6259 Muscle wasting and atrophy, not elsewhere classified, multiple sites: Secondary | ICD-10-CM | POA: Diagnosis not present

## 2022-03-30 DIAGNOSIS — E785 Hyperlipidemia, unspecified: Secondary | ICD-10-CM | POA: Diagnosis not present

## 2022-03-30 DIAGNOSIS — J441 Chronic obstructive pulmonary disease with (acute) exacerbation: Secondary | ICD-10-CM | POA: Diagnosis not present

## 2022-04-02 DIAGNOSIS — Z736 Limitation of activities due to disability: Secondary | ICD-10-CM | POA: Diagnosis not present

## 2022-04-02 DIAGNOSIS — R52 Pain, unspecified: Secondary | ICD-10-CM | POA: Diagnosis not present

## 2022-04-02 DIAGNOSIS — K219 Gastro-esophageal reflux disease without esophagitis: Secondary | ICD-10-CM | POA: Diagnosis not present

## 2022-04-02 DIAGNOSIS — I739 Peripheral vascular disease, unspecified: Secondary | ICD-10-CM | POA: Diagnosis not present

## 2022-04-02 DIAGNOSIS — I10 Essential (primary) hypertension: Secondary | ICD-10-CM | POA: Diagnosis not present

## 2022-04-02 DIAGNOSIS — J441 Chronic obstructive pulmonary disease with (acute) exacerbation: Secondary | ICD-10-CM | POA: Diagnosis not present

## 2022-04-02 DIAGNOSIS — E539 Vitamin B deficiency, unspecified: Secondary | ICD-10-CM | POA: Diagnosis not present

## 2022-04-02 DIAGNOSIS — E785 Hyperlipidemia, unspecified: Secondary | ICD-10-CM | POA: Diagnosis not present

## 2022-04-02 DIAGNOSIS — M6259 Muscle wasting and atrophy, not elsewhere classified, multiple sites: Secondary | ICD-10-CM | POA: Diagnosis not present

## 2022-04-02 DIAGNOSIS — I2582 Chronic total occlusion of coronary artery: Secondary | ICD-10-CM | POA: Diagnosis not present

## 2022-04-03 DIAGNOSIS — Z736 Limitation of activities due to disability: Secondary | ICD-10-CM | POA: Diagnosis not present

## 2022-04-03 DIAGNOSIS — K219 Gastro-esophageal reflux disease without esophagitis: Secondary | ICD-10-CM | POA: Diagnosis not present

## 2022-04-03 DIAGNOSIS — R52 Pain, unspecified: Secondary | ICD-10-CM | POA: Diagnosis not present

## 2022-04-03 DIAGNOSIS — I10 Essential (primary) hypertension: Secondary | ICD-10-CM | POA: Diagnosis not present

## 2022-04-03 DIAGNOSIS — E539 Vitamin B deficiency, unspecified: Secondary | ICD-10-CM | POA: Diagnosis not present

## 2022-04-03 DIAGNOSIS — J441 Chronic obstructive pulmonary disease with (acute) exacerbation: Secondary | ICD-10-CM | POA: Diagnosis not present

## 2022-04-03 DIAGNOSIS — M6259 Muscle wasting and atrophy, not elsewhere classified, multiple sites: Secondary | ICD-10-CM | POA: Diagnosis not present

## 2022-04-03 DIAGNOSIS — I739 Peripheral vascular disease, unspecified: Secondary | ICD-10-CM | POA: Diagnosis not present

## 2022-04-03 DIAGNOSIS — I2582 Chronic total occlusion of coronary artery: Secondary | ICD-10-CM | POA: Diagnosis not present

## 2022-04-03 DIAGNOSIS — E785 Hyperlipidemia, unspecified: Secondary | ICD-10-CM | POA: Diagnosis not present

## 2022-04-04 DIAGNOSIS — E539 Vitamin B deficiency, unspecified: Secondary | ICD-10-CM | POA: Diagnosis not present

## 2022-04-04 DIAGNOSIS — I10 Essential (primary) hypertension: Secondary | ICD-10-CM | POA: Diagnosis not present

## 2022-04-04 DIAGNOSIS — J441 Chronic obstructive pulmonary disease with (acute) exacerbation: Secondary | ICD-10-CM | POA: Diagnosis not present

## 2022-04-04 DIAGNOSIS — R52 Pain, unspecified: Secondary | ICD-10-CM | POA: Diagnosis not present

## 2022-04-04 DIAGNOSIS — Z736 Limitation of activities due to disability: Secondary | ICD-10-CM | POA: Diagnosis not present

## 2022-04-04 DIAGNOSIS — M6259 Muscle wasting and atrophy, not elsewhere classified, multiple sites: Secondary | ICD-10-CM | POA: Diagnosis not present

## 2022-04-04 DIAGNOSIS — I2582 Chronic total occlusion of coronary artery: Secondary | ICD-10-CM | POA: Diagnosis not present

## 2022-04-04 DIAGNOSIS — K219 Gastro-esophageal reflux disease without esophagitis: Secondary | ICD-10-CM | POA: Diagnosis not present

## 2022-04-04 DIAGNOSIS — I739 Peripheral vascular disease, unspecified: Secondary | ICD-10-CM | POA: Diagnosis not present

## 2022-04-04 DIAGNOSIS — E785 Hyperlipidemia, unspecified: Secondary | ICD-10-CM | POA: Diagnosis not present

## 2022-04-05 DIAGNOSIS — K219 Gastro-esophageal reflux disease without esophagitis: Secondary | ICD-10-CM | POA: Diagnosis not present

## 2022-04-05 DIAGNOSIS — I10 Essential (primary) hypertension: Secondary | ICD-10-CM | POA: Diagnosis not present

## 2022-04-05 DIAGNOSIS — M6259 Muscle wasting and atrophy, not elsewhere classified, multiple sites: Secondary | ICD-10-CM | POA: Diagnosis not present

## 2022-04-05 DIAGNOSIS — J441 Chronic obstructive pulmonary disease with (acute) exacerbation: Secondary | ICD-10-CM | POA: Diagnosis not present

## 2022-04-05 DIAGNOSIS — R52 Pain, unspecified: Secondary | ICD-10-CM | POA: Diagnosis not present

## 2022-04-05 DIAGNOSIS — I2582 Chronic total occlusion of coronary artery: Secondary | ICD-10-CM | POA: Diagnosis not present

## 2022-04-05 DIAGNOSIS — I739 Peripheral vascular disease, unspecified: Secondary | ICD-10-CM | POA: Diagnosis not present

## 2022-04-05 DIAGNOSIS — E785 Hyperlipidemia, unspecified: Secondary | ICD-10-CM | POA: Diagnosis not present

## 2022-04-05 DIAGNOSIS — E539 Vitamin B deficiency, unspecified: Secondary | ICD-10-CM | POA: Diagnosis not present

## 2022-04-05 DIAGNOSIS — Z736 Limitation of activities due to disability: Secondary | ICD-10-CM | POA: Diagnosis not present

## 2022-04-06 DIAGNOSIS — I739 Peripheral vascular disease, unspecified: Secondary | ICD-10-CM | POA: Diagnosis not present

## 2022-04-06 DIAGNOSIS — I10 Essential (primary) hypertension: Secondary | ICD-10-CM | POA: Diagnosis not present

## 2022-04-06 DIAGNOSIS — J441 Chronic obstructive pulmonary disease with (acute) exacerbation: Secondary | ICD-10-CM | POA: Diagnosis not present

## 2022-04-06 DIAGNOSIS — K219 Gastro-esophageal reflux disease without esophagitis: Secondary | ICD-10-CM | POA: Diagnosis not present

## 2022-04-06 DIAGNOSIS — I2582 Chronic total occlusion of coronary artery: Secondary | ICD-10-CM | POA: Diagnosis not present

## 2022-04-06 DIAGNOSIS — R52 Pain, unspecified: Secondary | ICD-10-CM | POA: Diagnosis not present

## 2022-04-06 DIAGNOSIS — E539 Vitamin B deficiency, unspecified: Secondary | ICD-10-CM | POA: Diagnosis not present

## 2022-04-06 DIAGNOSIS — Z736 Limitation of activities due to disability: Secondary | ICD-10-CM | POA: Diagnosis not present

## 2022-04-06 DIAGNOSIS — E785 Hyperlipidemia, unspecified: Secondary | ICD-10-CM | POA: Diagnosis not present

## 2022-04-06 DIAGNOSIS — M6259 Muscle wasting and atrophy, not elsewhere classified, multiple sites: Secondary | ICD-10-CM | POA: Diagnosis not present

## 2022-04-10 DIAGNOSIS — J441 Chronic obstructive pulmonary disease with (acute) exacerbation: Secondary | ICD-10-CM | POA: Diagnosis not present

## 2022-04-10 DIAGNOSIS — M6259 Muscle wasting and atrophy, not elsewhere classified, multiple sites: Secondary | ICD-10-CM | POA: Diagnosis not present

## 2022-04-10 DIAGNOSIS — I2582 Chronic total occlusion of coronary artery: Secondary | ICD-10-CM | POA: Diagnosis not present

## 2022-04-10 DIAGNOSIS — R52 Pain, unspecified: Secondary | ICD-10-CM | POA: Diagnosis not present

## 2022-04-10 DIAGNOSIS — K219 Gastro-esophageal reflux disease without esophagitis: Secondary | ICD-10-CM | POA: Diagnosis not present

## 2022-04-10 DIAGNOSIS — E785 Hyperlipidemia, unspecified: Secondary | ICD-10-CM | POA: Diagnosis not present

## 2022-04-10 DIAGNOSIS — I739 Peripheral vascular disease, unspecified: Secondary | ICD-10-CM | POA: Diagnosis not present

## 2022-04-10 DIAGNOSIS — E539 Vitamin B deficiency, unspecified: Secondary | ICD-10-CM | POA: Diagnosis not present

## 2022-04-10 DIAGNOSIS — I10 Essential (primary) hypertension: Secondary | ICD-10-CM | POA: Diagnosis not present

## 2022-04-10 DIAGNOSIS — Z736 Limitation of activities due to disability: Secondary | ICD-10-CM | POA: Diagnosis not present

## 2022-04-16 DIAGNOSIS — F32A Depression, unspecified: Secondary | ICD-10-CM | POA: Diagnosis not present

## 2022-04-19 DIAGNOSIS — I10 Essential (primary) hypertension: Secondary | ICD-10-CM | POA: Diagnosis not present

## 2022-04-23 DIAGNOSIS — E039 Hypothyroidism, unspecified: Secondary | ICD-10-CM | POA: Diagnosis not present

## 2022-04-23 DIAGNOSIS — E119 Type 2 diabetes mellitus without complications: Secondary | ICD-10-CM | POA: Diagnosis not present

## 2022-04-23 DIAGNOSIS — E559 Vitamin D deficiency, unspecified: Secondary | ICD-10-CM | POA: Diagnosis not present

## 2022-04-23 DIAGNOSIS — I10 Essential (primary) hypertension: Secondary | ICD-10-CM | POA: Diagnosis not present

## 2022-04-23 DIAGNOSIS — D649 Anemia, unspecified: Secondary | ICD-10-CM | POA: Diagnosis not present

## 2022-05-02 ENCOUNTER — Non-Acute Institutional Stay: Payer: Medicare Other | Admitting: Nurse Practitioner

## 2022-05-02 DIAGNOSIS — R0602 Shortness of breath: Secondary | ICD-10-CM | POA: Diagnosis not present

## 2022-05-02 DIAGNOSIS — J449 Chronic obstructive pulmonary disease, unspecified: Secondary | ICD-10-CM

## 2022-05-02 DIAGNOSIS — I509 Heart failure, unspecified: Secondary | ICD-10-CM | POA: Diagnosis not present

## 2022-05-02 DIAGNOSIS — Z515 Encounter for palliative care: Secondary | ICD-10-CM

## 2022-05-02 DIAGNOSIS — R5381 Other malaise: Secondary | ICD-10-CM

## 2022-05-02 NOTE — Progress Notes (Signed)
Therapist, nutritional Palliative Care Consult Note Telephone: [phone]  Fax: [phone]    Date of encounter: 05/02/22 3:13 PM PATIENT NAME: Anthony Chambers [address] [place] [address]   [phone] (home)  DOB: 1945-04-19 MRN: [id number] PRIMARY CARE PROVIDER:    [facility]  RESPONSIBLE PARTY:    Contact Information     Name Relation Home Work Mobile   Eligah East Sister [phone]    Ector,Robert Relative   [phone]   [place] G[place] [phone]     Ector,Brent Nephew [phone]          I met face to face with patient in facility. Palliative Care was asked to follow this patient by consultation request of  Rosetta Posner, MD /[facility] to address advance care planning and complex medical decision making. This is a follow up visit.                                  ASSESSMENT AND PLAN / RECOMMENDATIONS:  Symptom Management/Plan: 1. Advance Care Planning;     2. Palliative care encounter; Palliative care encounter; Palliative medicine team will continue to support patient, patient's family, and medical team. Visit consisted of counseling and education dealing with the complex and emotionally intense issues of symptom management and palliative care in the setting of serious and potentially life-threatening illness   3. Shortness of breath secondary to COPD, Chronic diastolic heart failure with O2 dependent; monitor respiratory status, weights, continue current medications reviewed, monitor routine labs; stable, no recent exacerbation. Discussed chronic disease mgt, education done; continue current medication regimen; monitoring for edema.   03/14/2022 weight 227.4 lbs 05/01/2022 weight 230.3 lbs   5. Debility with generalized weakness secondary to co-morbidities, overall decline. Encourage mobility, oob, fall risk, socialization.    Follow up Palliative Care Visit: Palliative care will  continue to follow for complex medical decision making, advance care planning, and clarification of goals. Return 4 to 8 weeks or prn.   I spent 47 minutes providing this consultation. More than 50% of the time in this consultation was spent in counseling and care coordination. PPS: 50% Chief Complaint: Follow up palliative consult for complex medical decision making, address goals, manage ongoing symptoms   HISTORY OF PRESENT ILLNESS:  ROGAN WIGLEY is a 77 y.o. year old male  with multiple medical problems including PVD, h/o DVT, h/o popliteal artery aneurysm, chronic diastolic heart failure, COPD, chronic respiratory failure with hypoxia and hypercapnia, GERD, HLD. Mr Cudworth resides at [facility]. Mr Schroll requires assistance for transfers, mobility, adl's including bathing, dressing, tray set up. Mr Beaver does feed himself with good appetite. Purpose of today PC f/u visit further discussion monitor trends of appetite, weights, monitor for functional, cognitive decline with chronic disease progression, assess any active symptoms, supportive role. At present Mr Mendiola is sitting in the w/c in his room, O2 supplemental, appears comfortable. Mr Duffner denies any discomfort. We talked about how he has been feeling, ros, functional abilities, his daily activities, socialization, quality of life, Most pc visit supportive. Medical goals, medications and poc reviewed. Therapeutic listening emotional support provided. Questions answered. PC f/u visit further discussion monitor trends of appetite, weights, monitor for functional, cognitive decline with chronic disease progression, assess any active symptoms, supportive role. Currently Mr Pintor is stable.    History obtained from review of EMR, discussion with primary team, and  interview with family, facility staff/caregiver and/or Mr. Frei.  I reviewed available labs, medications, imaging, studies and related documents from the EMR.  Records  reviewed and summarized above.  Physical Exam: General: debilitated, pleasant male, engaging ENMT: oral mucous membranes moist CV: S1S2, RRR, +BLE edema Pulmonary: breath sounds decreased bases, otherwise clear, O2 supplemental MSK: w/c dependent Psych: non-anxious affect, A and oriented    Thank you for the opportunity to participate in the care of Mr. Szatkowski. Please call our office at [phone] if we can be of additional assistance.   Brieana Shimmin Prince Rome, NP

## 2022-05-03 DIAGNOSIS — E559 Vitamin D deficiency, unspecified: Secondary | ICD-10-CM | POA: Diagnosis not present

## 2022-05-03 DIAGNOSIS — J9611 Chronic respiratory failure with hypoxia: Secondary | ICD-10-CM | POA: Diagnosis not present

## 2022-05-03 DIAGNOSIS — I5032 Chronic diastolic (congestive) heart failure: Secondary | ICD-10-CM | POA: Diagnosis not present

## 2022-05-03 DIAGNOSIS — M159 Polyosteoarthritis, unspecified: Secondary | ICD-10-CM | POA: Diagnosis not present

## 2022-05-04 DIAGNOSIS — E559 Vitamin D deficiency, unspecified: Secondary | ICD-10-CM | POA: Diagnosis not present

## 2022-05-31 ENCOUNTER — Encounter: Payer: Self-pay | Admitting: Nurse Practitioner

## 2022-05-31 ENCOUNTER — Non-Acute Institutional Stay: Payer: Medicare Other | Admitting: Nurse Practitioner

## 2022-05-31 DIAGNOSIS — Z515 Encounter for palliative care: Secondary | ICD-10-CM

## 2022-05-31 DIAGNOSIS — I509 Heart failure, unspecified: Secondary | ICD-10-CM

## 2022-05-31 DIAGNOSIS — R5381 Other malaise: Secondary | ICD-10-CM | POA: Diagnosis not present

## 2022-05-31 DIAGNOSIS — R2681 Unsteadiness on feet: Secondary | ICD-10-CM | POA: Diagnosis not present

## 2022-05-31 DIAGNOSIS — R0602 Shortness of breath: Secondary | ICD-10-CM

## 2022-05-31 DIAGNOSIS — J449 Chronic obstructive pulmonary disease, unspecified: Secondary | ICD-10-CM

## 2022-05-31 NOTE — Progress Notes (Addendum)
Therapist, nutritional Palliative Care Consult Note Telephone: [phone]  Fax: [phone]    Date of encounter: 05/31/22 9:55 PM PATIENT NAME: Anthony Chambers [address] [place] [address]   [phone] (home)  DOB: [birth year] MRN: [id number] PRIMARY CARE PROVIDER:    [facility]  RESPONSIBLE PARTY:    Contact Information       Name Relation Home Work Mobile    Anthony Chambers Sister [phone]      Anthony Chambers,Anthony Chambers Relative     [phone]    Anthony Chambers [phone]        Anthony Chambers,Anthony Chambers Nephew [phone]         I met face to face with patient in facility. Palliative Care was asked to follow this patient by consultation request of  Rosetta Posner, MD /[facility] to address advance care planning and complex medical decision making. This is a follow up visit.                                  ASSESSMENT AND PLAN / RECOMMENDATIONS:  Symptom Management/Plan: 1. Advance Care Planning;  Ongoing discussions   2. Palliative care encounter; Palliative care encounter; Palliative medicine team will continue to support patient, patient's family, and medical team. Visit consisted of counseling and education dealing with the complex and emotionally intense issues of symptom management and palliative care in the setting of serious and potentially life-threatening illness   3. Shortness of breath secondary to COPD, Chronic diastolic heart failure with O2 dependent; monitor respiratory status, weights, continue current medications reviewed, monitor routine labs; stable, no recent exacerbation. Discussed chronic disease mgt, education done; continue current medication regimen; monitoring for edema.   03/14/2022 weight 227.4 lbs 05/01/2022 weight 230.3 lbs   5. Debility with generalized weakness secondary to co-morbidities, overall decline. Encourage mobility, oob, fall risk, socialization.    Follow up Palliative Care  Visit: Palliative care will continue to follow for complex medical decision making, advance care planning, and clarification of goals. Return 4 to 8 weeks or prn.   I spent 45 minutes providing this consultation. More than 50% of the time in this consultation was spent in counseling and care coordination. PPS: 50% Chief Complaint: Follow up palliative consult for complex medical decision making, address goals, manage ongoing symptoms   HISTORY OF PRESENT ILLNESS:  Anthony Chambers is a 77 y.o. year old male  with multiple medical problems including PVD, h/o DVT, h/o popliteal artery aneurysm, chronic diastolic heart failure, COPD, chronic respiratory failure with hypoxia and hypercapnia, GERD, HLD. [name] resides at [facility]. [name] requires assistance for transfers, mobility, adl's including bathing, dressing, tray set up. [name] does feed himself with good appetite. Purpose of today PC f/u visit further discussion monitor trends of appetite, weights, monitor for functional, cognitive decline with chronic disease progression, assess any active symptoms, supportive role. At present [name] is sitting in the w/c in his room, O2 supplemental, appears comfortable. [name] denies any discomfort. [name] and I talked about how he has been feeling, pc visit, symptoms including sob, pain, appetite, sleeping without difficulty, daily routine, residing at facility. We talked about chronic disease progression though currently stable. We talked about what brings him joy, improves quality of life.  Most pc visit supportive. Medical goals, medications and poc reviewed. Therapeutic listening emotional support provided. Questions answered. PC f/u visit further  discussion monitor trends of appetite, weights, monitor for functional, cognitive decline with chronic disease progression, assess any active symptoms, supportive role. Currently [name] is stable.    History obtained from review of EMR,  discussion with primary team, and interview with family, facility staff/caregiver and/or Mr. Purifoy.  I reviewed available labs, medications, imaging, studies and related documents from the EMR.  Records reviewed and summarized above.  Physical Exam: General: debilitated, pleasant male, engaging ENMT: oral mucous membranes moist CV: S1S2, RRR, +BLE edema Pulmonary: breath sounds decreased bases, otherwise clear, O2 supplemental MSK: w/c dependent Psych: non-anxious affect, A and oriented   Thank you for the opportunity to participate in the care of Mr. Vanyo. Please call our office at [phone] if we can be of additional assistance.   Reiley Keisler Prince Rome, NP

## 2022-06-01 DIAGNOSIS — R2681 Unsteadiness on feet: Secondary | ICD-10-CM | POA: Diagnosis not present

## 2022-06-04 DIAGNOSIS — R2681 Unsteadiness on feet: Secondary | ICD-10-CM | POA: Diagnosis not present

## 2022-06-05 DIAGNOSIS — R2681 Unsteadiness on feet: Secondary | ICD-10-CM | POA: Diagnosis not present

## 2022-06-06 DIAGNOSIS — R2681 Unsteadiness on feet: Secondary | ICD-10-CM | POA: Diagnosis not present

## 2022-06-07 DIAGNOSIS — R2681 Unsteadiness on feet: Secondary | ICD-10-CM | POA: Diagnosis not present

## 2022-06-08 DIAGNOSIS — R2681 Unsteadiness on feet: Secondary | ICD-10-CM | POA: Diagnosis not present

## 2022-06-11 DIAGNOSIS — M6281 Muscle weakness (generalized): Secondary | ICD-10-CM | POA: Diagnosis not present

## 2022-06-11 DIAGNOSIS — I739 Peripheral vascular disease, unspecified: Secondary | ICD-10-CM | POA: Diagnosis not present

## 2022-06-11 DIAGNOSIS — R52 Pain, unspecified: Secondary | ICD-10-CM | POA: Diagnosis not present

## 2022-06-11 DIAGNOSIS — K219 Gastro-esophageal reflux disease without esophagitis: Secondary | ICD-10-CM | POA: Diagnosis not present

## 2022-06-11 DIAGNOSIS — J441 Chronic obstructive pulmonary disease with (acute) exacerbation: Secondary | ICD-10-CM | POA: Diagnosis not present

## 2022-06-11 DIAGNOSIS — I10 Essential (primary) hypertension: Secondary | ICD-10-CM | POA: Diagnosis not present

## 2022-06-11 DIAGNOSIS — E785 Hyperlipidemia, unspecified: Secondary | ICD-10-CM | POA: Diagnosis not present

## 2022-06-11 DIAGNOSIS — I2582 Chronic total occlusion of coronary artery: Secondary | ICD-10-CM | POA: Diagnosis not present

## 2022-06-11 DIAGNOSIS — R2681 Unsteadiness on feet: Secondary | ICD-10-CM | POA: Diagnosis not present

## 2022-06-11 DIAGNOSIS — E539 Vitamin B deficiency, unspecified: Secondary | ICD-10-CM | POA: Diagnosis not present

## 2022-06-12 DIAGNOSIS — E539 Vitamin B deficiency, unspecified: Secondary | ICD-10-CM | POA: Diagnosis not present

## 2022-06-12 DIAGNOSIS — K219 Gastro-esophageal reflux disease without esophagitis: Secondary | ICD-10-CM | POA: Diagnosis not present

## 2022-06-12 DIAGNOSIS — J441 Chronic obstructive pulmonary disease with (acute) exacerbation: Secondary | ICD-10-CM | POA: Diagnosis not present

## 2022-06-12 DIAGNOSIS — E785 Hyperlipidemia, unspecified: Secondary | ICD-10-CM | POA: Diagnosis not present

## 2022-06-12 DIAGNOSIS — I2582 Chronic total occlusion of coronary artery: Secondary | ICD-10-CM | POA: Diagnosis not present

## 2022-06-12 DIAGNOSIS — I739 Peripheral vascular disease, unspecified: Secondary | ICD-10-CM | POA: Diagnosis not present

## 2022-06-12 DIAGNOSIS — I10 Essential (primary) hypertension: Secondary | ICD-10-CM | POA: Diagnosis not present

## 2022-06-12 DIAGNOSIS — R2681 Unsteadiness on feet: Secondary | ICD-10-CM | POA: Diagnosis not present

## 2022-06-12 DIAGNOSIS — M6281 Muscle weakness (generalized): Secondary | ICD-10-CM | POA: Diagnosis not present

## 2022-06-12 DIAGNOSIS — R52 Pain, unspecified: Secondary | ICD-10-CM | POA: Diagnosis not present

## 2022-06-13 DIAGNOSIS — R2681 Unsteadiness on feet: Secondary | ICD-10-CM | POA: Diagnosis not present

## 2022-06-13 DIAGNOSIS — R52 Pain, unspecified: Secondary | ICD-10-CM | POA: Diagnosis not present

## 2022-06-13 DIAGNOSIS — E539 Vitamin B deficiency, unspecified: Secondary | ICD-10-CM | POA: Diagnosis not present

## 2022-06-13 DIAGNOSIS — I739 Peripheral vascular disease, unspecified: Secondary | ICD-10-CM | POA: Diagnosis not present

## 2022-06-13 DIAGNOSIS — E785 Hyperlipidemia, unspecified: Secondary | ICD-10-CM | POA: Diagnosis not present

## 2022-06-13 DIAGNOSIS — I10 Essential (primary) hypertension: Secondary | ICD-10-CM | POA: Diagnosis not present

## 2022-06-13 DIAGNOSIS — I2582 Chronic total occlusion of coronary artery: Secondary | ICD-10-CM | POA: Diagnosis not present

## 2022-06-13 DIAGNOSIS — J441 Chronic obstructive pulmonary disease with (acute) exacerbation: Secondary | ICD-10-CM | POA: Diagnosis not present

## 2022-06-13 DIAGNOSIS — K219 Gastro-esophageal reflux disease without esophagitis: Secondary | ICD-10-CM | POA: Diagnosis not present

## 2022-06-13 DIAGNOSIS — M6281 Muscle weakness (generalized): Secondary | ICD-10-CM | POA: Diagnosis not present

## 2022-06-14 DIAGNOSIS — J441 Chronic obstructive pulmonary disease with (acute) exacerbation: Secondary | ICD-10-CM | POA: Diagnosis not present

## 2022-06-14 DIAGNOSIS — R2681 Unsteadiness on feet: Secondary | ICD-10-CM | POA: Diagnosis not present

## 2022-06-14 DIAGNOSIS — I739 Peripheral vascular disease, unspecified: Secondary | ICD-10-CM | POA: Diagnosis not present

## 2022-06-14 DIAGNOSIS — M6281 Muscle weakness (generalized): Secondary | ICD-10-CM | POA: Diagnosis not present

## 2022-06-14 DIAGNOSIS — E539 Vitamin B deficiency, unspecified: Secondary | ICD-10-CM | POA: Diagnosis not present

## 2022-06-14 DIAGNOSIS — R52 Pain, unspecified: Secondary | ICD-10-CM | POA: Diagnosis not present

## 2022-06-14 DIAGNOSIS — I10 Essential (primary) hypertension: Secondary | ICD-10-CM | POA: Diagnosis not present

## 2022-06-14 DIAGNOSIS — I2582 Chronic total occlusion of coronary artery: Secondary | ICD-10-CM | POA: Diagnosis not present

## 2022-06-14 DIAGNOSIS — E785 Hyperlipidemia, unspecified: Secondary | ICD-10-CM | POA: Diagnosis not present

## 2022-06-14 DIAGNOSIS — K219 Gastro-esophageal reflux disease without esophagitis: Secondary | ICD-10-CM | POA: Diagnosis not present

## 2022-06-15 DIAGNOSIS — M6281 Muscle weakness (generalized): Secondary | ICD-10-CM | POA: Diagnosis not present

## 2022-06-15 DIAGNOSIS — J441 Chronic obstructive pulmonary disease with (acute) exacerbation: Secondary | ICD-10-CM | POA: Diagnosis not present

## 2022-06-15 DIAGNOSIS — I10 Essential (primary) hypertension: Secondary | ICD-10-CM | POA: Diagnosis not present

## 2022-06-15 DIAGNOSIS — E539 Vitamin B deficiency, unspecified: Secondary | ICD-10-CM | POA: Diagnosis not present

## 2022-06-15 DIAGNOSIS — I2582 Chronic total occlusion of coronary artery: Secondary | ICD-10-CM | POA: Diagnosis not present

## 2022-06-15 DIAGNOSIS — R2681 Unsteadiness on feet: Secondary | ICD-10-CM | POA: Diagnosis not present

## 2022-06-15 DIAGNOSIS — I739 Peripheral vascular disease, unspecified: Secondary | ICD-10-CM | POA: Diagnosis not present

## 2022-06-15 DIAGNOSIS — K219 Gastro-esophageal reflux disease without esophagitis: Secondary | ICD-10-CM | POA: Diagnosis not present

## 2022-06-15 DIAGNOSIS — R52 Pain, unspecified: Secondary | ICD-10-CM | POA: Diagnosis not present

## 2022-06-15 DIAGNOSIS — E785 Hyperlipidemia, unspecified: Secondary | ICD-10-CM | POA: Diagnosis not present

## 2022-06-18 DIAGNOSIS — I739 Peripheral vascular disease, unspecified: Secondary | ICD-10-CM | POA: Diagnosis not present

## 2022-06-18 DIAGNOSIS — M6281 Muscle weakness (generalized): Secondary | ICD-10-CM | POA: Diagnosis not present

## 2022-06-18 DIAGNOSIS — J441 Chronic obstructive pulmonary disease with (acute) exacerbation: Secondary | ICD-10-CM | POA: Diagnosis not present

## 2022-06-18 DIAGNOSIS — I2582 Chronic total occlusion of coronary artery: Secondary | ICD-10-CM | POA: Diagnosis not present

## 2022-06-18 DIAGNOSIS — E539 Vitamin B deficiency, unspecified: Secondary | ICD-10-CM | POA: Diagnosis not present

## 2022-06-18 DIAGNOSIS — R52 Pain, unspecified: Secondary | ICD-10-CM | POA: Diagnosis not present

## 2022-06-18 DIAGNOSIS — R2681 Unsteadiness on feet: Secondary | ICD-10-CM | POA: Diagnosis not present

## 2022-06-18 DIAGNOSIS — K219 Gastro-esophageal reflux disease without esophagitis: Secondary | ICD-10-CM | POA: Diagnosis not present

## 2022-06-18 DIAGNOSIS — I10 Essential (primary) hypertension: Secondary | ICD-10-CM | POA: Diagnosis not present

## 2022-06-18 DIAGNOSIS — E785 Hyperlipidemia, unspecified: Secondary | ICD-10-CM | POA: Diagnosis not present

## 2022-06-19 DIAGNOSIS — M6281 Muscle weakness (generalized): Secondary | ICD-10-CM | POA: Diagnosis not present

## 2022-06-19 DIAGNOSIS — I2582 Chronic total occlusion of coronary artery: Secondary | ICD-10-CM | POA: Diagnosis not present

## 2022-06-19 DIAGNOSIS — E785 Hyperlipidemia, unspecified: Secondary | ICD-10-CM | POA: Diagnosis not present

## 2022-06-19 DIAGNOSIS — R52 Pain, unspecified: Secondary | ICD-10-CM | POA: Diagnosis not present

## 2022-06-19 DIAGNOSIS — R2681 Unsteadiness on feet: Secondary | ICD-10-CM | POA: Diagnosis not present

## 2022-06-19 DIAGNOSIS — K219 Gastro-esophageal reflux disease without esophagitis: Secondary | ICD-10-CM | POA: Diagnosis not present

## 2022-06-19 DIAGNOSIS — I739 Peripheral vascular disease, unspecified: Secondary | ICD-10-CM | POA: Diagnosis not present

## 2022-06-19 DIAGNOSIS — I10 Essential (primary) hypertension: Secondary | ICD-10-CM | POA: Diagnosis not present

## 2022-06-19 DIAGNOSIS — J441 Chronic obstructive pulmonary disease with (acute) exacerbation: Secondary | ICD-10-CM | POA: Diagnosis not present

## 2022-06-19 DIAGNOSIS — E539 Vitamin B deficiency, unspecified: Secondary | ICD-10-CM | POA: Diagnosis not present

## 2022-06-20 DIAGNOSIS — E785 Hyperlipidemia, unspecified: Secondary | ICD-10-CM | POA: Diagnosis not present

## 2022-06-20 DIAGNOSIS — K219 Gastro-esophageal reflux disease without esophagitis: Secondary | ICD-10-CM | POA: Diagnosis not present

## 2022-06-20 DIAGNOSIS — E539 Vitamin B deficiency, unspecified: Secondary | ICD-10-CM | POA: Diagnosis not present

## 2022-06-20 DIAGNOSIS — J441 Chronic obstructive pulmonary disease with (acute) exacerbation: Secondary | ICD-10-CM | POA: Diagnosis not present

## 2022-06-20 DIAGNOSIS — M6281 Muscle weakness (generalized): Secondary | ICD-10-CM | POA: Diagnosis not present

## 2022-06-20 DIAGNOSIS — I2582 Chronic total occlusion of coronary artery: Secondary | ICD-10-CM | POA: Diagnosis not present

## 2022-06-20 DIAGNOSIS — R52 Pain, unspecified: Secondary | ICD-10-CM | POA: Diagnosis not present

## 2022-06-20 DIAGNOSIS — I10 Essential (primary) hypertension: Secondary | ICD-10-CM | POA: Diagnosis not present

## 2022-06-20 DIAGNOSIS — R2681 Unsteadiness on feet: Secondary | ICD-10-CM | POA: Diagnosis not present

## 2022-06-20 DIAGNOSIS — I739 Peripheral vascular disease, unspecified: Secondary | ICD-10-CM | POA: Diagnosis not present

## 2022-06-21 DIAGNOSIS — R52 Pain, unspecified: Secondary | ICD-10-CM | POA: Diagnosis not present

## 2022-06-21 DIAGNOSIS — I10 Essential (primary) hypertension: Secondary | ICD-10-CM | POA: Diagnosis not present

## 2022-06-21 DIAGNOSIS — K219 Gastro-esophageal reflux disease without esophagitis: Secondary | ICD-10-CM | POA: Diagnosis not present

## 2022-06-21 DIAGNOSIS — J441 Chronic obstructive pulmonary disease with (acute) exacerbation: Secondary | ICD-10-CM | POA: Diagnosis not present

## 2022-06-21 DIAGNOSIS — R2681 Unsteadiness on feet: Secondary | ICD-10-CM | POA: Diagnosis not present

## 2022-06-21 DIAGNOSIS — M6281 Muscle weakness (generalized): Secondary | ICD-10-CM | POA: Diagnosis not present

## 2022-06-21 DIAGNOSIS — I739 Peripheral vascular disease, unspecified: Secondary | ICD-10-CM | POA: Diagnosis not present

## 2022-06-21 DIAGNOSIS — E539 Vitamin B deficiency, unspecified: Secondary | ICD-10-CM | POA: Diagnosis not present

## 2022-06-21 DIAGNOSIS — I2582 Chronic total occlusion of coronary artery: Secondary | ICD-10-CM | POA: Diagnosis not present

## 2022-06-21 DIAGNOSIS — E785 Hyperlipidemia, unspecified: Secondary | ICD-10-CM | POA: Diagnosis not present

## 2022-06-22 DIAGNOSIS — J441 Chronic obstructive pulmonary disease with (acute) exacerbation: Secondary | ICD-10-CM | POA: Diagnosis not present

## 2022-06-22 DIAGNOSIS — R2681 Unsteadiness on feet: Secondary | ICD-10-CM | POA: Diagnosis not present

## 2022-06-22 DIAGNOSIS — R52 Pain, unspecified: Secondary | ICD-10-CM | POA: Diagnosis not present

## 2022-06-22 DIAGNOSIS — E539 Vitamin B deficiency, unspecified: Secondary | ICD-10-CM | POA: Diagnosis not present

## 2022-06-22 DIAGNOSIS — I10 Essential (primary) hypertension: Secondary | ICD-10-CM | POA: Diagnosis not present

## 2022-06-22 DIAGNOSIS — I739 Peripheral vascular disease, unspecified: Secondary | ICD-10-CM | POA: Diagnosis not present

## 2022-06-22 DIAGNOSIS — K219 Gastro-esophageal reflux disease without esophagitis: Secondary | ICD-10-CM | POA: Diagnosis not present

## 2022-06-22 DIAGNOSIS — E785 Hyperlipidemia, unspecified: Secondary | ICD-10-CM | POA: Diagnosis not present

## 2022-06-22 DIAGNOSIS — I2582 Chronic total occlusion of coronary artery: Secondary | ICD-10-CM | POA: Diagnosis not present

## 2022-06-22 DIAGNOSIS — M6281 Muscle weakness (generalized): Secondary | ICD-10-CM | POA: Diagnosis not present

## 2022-06-23 DIAGNOSIS — R52 Pain, unspecified: Secondary | ICD-10-CM | POA: Diagnosis not present

## 2022-06-23 DIAGNOSIS — M6281 Muscle weakness (generalized): Secondary | ICD-10-CM | POA: Diagnosis not present

## 2022-06-23 DIAGNOSIS — J441 Chronic obstructive pulmonary disease with (acute) exacerbation: Secondary | ICD-10-CM | POA: Diagnosis not present

## 2022-06-23 DIAGNOSIS — E539 Vitamin B deficiency, unspecified: Secondary | ICD-10-CM | POA: Diagnosis not present

## 2022-06-23 DIAGNOSIS — I2582 Chronic total occlusion of coronary artery: Secondary | ICD-10-CM | POA: Diagnosis not present

## 2022-06-23 DIAGNOSIS — K219 Gastro-esophageal reflux disease without esophagitis: Secondary | ICD-10-CM | POA: Diagnosis not present

## 2022-06-23 DIAGNOSIS — R2681 Unsteadiness on feet: Secondary | ICD-10-CM | POA: Diagnosis not present

## 2022-06-23 DIAGNOSIS — I739 Peripheral vascular disease, unspecified: Secondary | ICD-10-CM | POA: Diagnosis not present

## 2022-06-23 DIAGNOSIS — E785 Hyperlipidemia, unspecified: Secondary | ICD-10-CM | POA: Diagnosis not present

## 2022-06-23 DIAGNOSIS — I10 Essential (primary) hypertension: Secondary | ICD-10-CM | POA: Diagnosis not present

## 2022-06-25 DIAGNOSIS — E539 Vitamin B deficiency, unspecified: Secondary | ICD-10-CM | POA: Diagnosis not present

## 2022-06-25 DIAGNOSIS — E785 Hyperlipidemia, unspecified: Secondary | ICD-10-CM | POA: Diagnosis not present

## 2022-06-25 DIAGNOSIS — I10 Essential (primary) hypertension: Secondary | ICD-10-CM | POA: Diagnosis not present

## 2022-06-25 DIAGNOSIS — M6281 Muscle weakness (generalized): Secondary | ICD-10-CM | POA: Diagnosis not present

## 2022-06-25 DIAGNOSIS — R2681 Unsteadiness on feet: Secondary | ICD-10-CM | POA: Diagnosis not present

## 2022-06-25 DIAGNOSIS — J441 Chronic obstructive pulmonary disease with (acute) exacerbation: Secondary | ICD-10-CM | POA: Diagnosis not present

## 2022-06-25 DIAGNOSIS — I2582 Chronic total occlusion of coronary artery: Secondary | ICD-10-CM | POA: Diagnosis not present

## 2022-06-25 DIAGNOSIS — I739 Peripheral vascular disease, unspecified: Secondary | ICD-10-CM | POA: Diagnosis not present

## 2022-06-25 DIAGNOSIS — K219 Gastro-esophageal reflux disease without esophagitis: Secondary | ICD-10-CM | POA: Diagnosis not present

## 2022-06-25 DIAGNOSIS — R52 Pain, unspecified: Secondary | ICD-10-CM | POA: Diagnosis not present

## 2022-06-27 DIAGNOSIS — R52 Pain, unspecified: Secondary | ICD-10-CM | POA: Diagnosis not present

## 2022-06-27 DIAGNOSIS — M6281 Muscle weakness (generalized): Secondary | ICD-10-CM | POA: Diagnosis not present

## 2022-06-27 DIAGNOSIS — E785 Hyperlipidemia, unspecified: Secondary | ICD-10-CM | POA: Diagnosis not present

## 2022-06-27 DIAGNOSIS — R2681 Unsteadiness on feet: Secondary | ICD-10-CM | POA: Diagnosis not present

## 2022-06-27 DIAGNOSIS — I739 Peripheral vascular disease, unspecified: Secondary | ICD-10-CM | POA: Diagnosis not present

## 2022-06-27 DIAGNOSIS — K219 Gastro-esophageal reflux disease without esophagitis: Secondary | ICD-10-CM | POA: Diagnosis not present

## 2022-06-27 DIAGNOSIS — I10 Essential (primary) hypertension: Secondary | ICD-10-CM | POA: Diagnosis not present

## 2022-06-27 DIAGNOSIS — J441 Chronic obstructive pulmonary disease with (acute) exacerbation: Secondary | ICD-10-CM | POA: Diagnosis not present

## 2022-06-27 DIAGNOSIS — E539 Vitamin B deficiency, unspecified: Secondary | ICD-10-CM | POA: Diagnosis not present

## 2022-06-27 DIAGNOSIS — I2582 Chronic total occlusion of coronary artery: Secondary | ICD-10-CM | POA: Diagnosis not present

## 2022-07-02 DIAGNOSIS — E559 Vitamin D deficiency, unspecified: Secondary | ICD-10-CM | POA: Diagnosis not present

## 2022-07-02 DIAGNOSIS — M159 Polyosteoarthritis, unspecified: Secondary | ICD-10-CM | POA: Diagnosis not present

## 2022-07-02 DIAGNOSIS — J9611 Chronic respiratory failure with hypoxia: Secondary | ICD-10-CM | POA: Diagnosis not present

## 2022-07-02 DIAGNOSIS — I5032 Chronic diastolic (congestive) heart failure: Secondary | ICD-10-CM | POA: Diagnosis not present

## 2022-07-27 DIAGNOSIS — J449 Chronic obstructive pulmonary disease, unspecified: Secondary | ICD-10-CM | POA: Diagnosis not present

## 2022-07-27 DIAGNOSIS — R0602 Shortness of breath: Secondary | ICD-10-CM | POA: Diagnosis not present

## 2022-07-27 DIAGNOSIS — I5032 Chronic diastolic (congestive) heart failure: Secondary | ICD-10-CM | POA: Diagnosis not present

## 2022-07-27 DIAGNOSIS — Z515 Encounter for palliative care: Secondary | ICD-10-CM | POA: Diagnosis not present

## 2022-08-08 DIAGNOSIS — R0602 Shortness of breath: Secondary | ICD-10-CM | POA: Diagnosis not present

## 2022-08-08 DIAGNOSIS — I5032 Chronic diastolic (congestive) heart failure: Secondary | ICD-10-CM | POA: Diagnosis not present

## 2022-08-08 DIAGNOSIS — Z515 Encounter for palliative care: Secondary | ICD-10-CM | POA: Diagnosis not present

## 2022-08-08 DIAGNOSIS — J449 Chronic obstructive pulmonary disease, unspecified: Secondary | ICD-10-CM | POA: Diagnosis not present

## 2022-08-30 DIAGNOSIS — R54 Age-related physical debility: Secondary | ICD-10-CM | POA: Diagnosis not present

## 2022-08-30 DIAGNOSIS — J449 Chronic obstructive pulmonary disease, unspecified: Secondary | ICD-10-CM | POA: Diagnosis not present

## 2022-08-30 DIAGNOSIS — I5032 Chronic diastolic (congestive) heart failure: Secondary | ICD-10-CM | POA: Diagnosis not present

## 2022-08-30 DIAGNOSIS — Z515 Encounter for palliative care: Secondary | ICD-10-CM | POA: Diagnosis not present

## 2022-08-30 DIAGNOSIS — R0602 Shortness of breath: Secondary | ICD-10-CM | POA: Diagnosis not present

## 2022-09-03 DIAGNOSIS — M159 Polyosteoarthritis, unspecified: Secondary | ICD-10-CM | POA: Diagnosis not present

## 2022-09-03 DIAGNOSIS — E785 Hyperlipidemia, unspecified: Secondary | ICD-10-CM | POA: Diagnosis not present

## 2022-09-03 DIAGNOSIS — I5032 Chronic diastolic (congestive) heart failure: Secondary | ICD-10-CM | POA: Diagnosis not present

## 2022-09-03 DIAGNOSIS — J9611 Chronic respiratory failure with hypoxia: Secondary | ICD-10-CM | POA: Diagnosis not present

## 2022-09-03 DIAGNOSIS — D649 Anemia, unspecified: Secondary | ICD-10-CM | POA: Diagnosis not present

## 2022-09-03 DIAGNOSIS — Z79899 Other long term (current) drug therapy: Secondary | ICD-10-CM | POA: Diagnosis not present

## 2022-09-03 DIAGNOSIS — I509 Heart failure, unspecified: Secondary | ICD-10-CM | POA: Diagnosis not present

## 2022-09-03 DIAGNOSIS — E559 Vitamin D deficiency, unspecified: Secondary | ICD-10-CM | POA: Diagnosis not present

## 2022-09-06 DIAGNOSIS — E441 Mild protein-calorie malnutrition: Secondary | ICD-10-CM | POA: Diagnosis not present

## 2022-09-06 DIAGNOSIS — J9611 Chronic respiratory failure with hypoxia: Secondary | ICD-10-CM | POA: Diagnosis not present

## 2022-09-06 DIAGNOSIS — E559 Vitamin D deficiency, unspecified: Secondary | ICD-10-CM | POA: Diagnosis not present

## 2022-09-06 DIAGNOSIS — Z86718 Personal history of other venous thrombosis and embolism: Secondary | ICD-10-CM | POA: Diagnosis not present

## 2022-09-24 DIAGNOSIS — E441 Mild protein-calorie malnutrition: Secondary | ICD-10-CM | POA: Diagnosis not present

## 2022-09-24 DIAGNOSIS — R634 Abnormal weight loss: Secondary | ICD-10-CM | POA: Diagnosis not present

## 2022-09-24 DIAGNOSIS — J9611 Chronic respiratory failure with hypoxia: Secondary | ICD-10-CM | POA: Diagnosis not present

## 2022-10-18 DIAGNOSIS — J9611 Chronic respiratory failure with hypoxia: Secondary | ICD-10-CM | POA: Diagnosis not present

## 2022-10-18 DIAGNOSIS — I5032 Chronic diastolic (congestive) heart failure: Secondary | ICD-10-CM | POA: Diagnosis not present

## 2022-10-19 DIAGNOSIS — I25112 Atherosclerotic heart disease of native coronary artery with refractory angina pectoris: Secondary | ICD-10-CM | POA: Diagnosis not present

## 2022-10-19 DIAGNOSIS — I10 Essential (primary) hypertension: Secondary | ICD-10-CM | POA: Diagnosis not present

## 2022-10-22 DIAGNOSIS — I5032 Chronic diastolic (congestive) heart failure: Secondary | ICD-10-CM | POA: Diagnosis not present

## 2022-11-01 DIAGNOSIS — J9611 Chronic respiratory failure with hypoxia: Secondary | ICD-10-CM | POA: Diagnosis not present

## 2022-12-11 DIAGNOSIS — J9611 Chronic respiratory failure with hypoxia: Secondary | ICD-10-CM | POA: Diagnosis not present

## 2022-12-19 DIAGNOSIS — I10 Essential (primary) hypertension: Secondary | ICD-10-CM | POA: Diagnosis not present

## 2022-12-21 DIAGNOSIS — I10 Essential (primary) hypertension: Secondary | ICD-10-CM | POA: Diagnosis not present

## 2022-12-22 DIAGNOSIS — I10 Essential (primary) hypertension: Secondary | ICD-10-CM | POA: Diagnosis not present

## 2022-12-26 DIAGNOSIS — I10 Essential (primary) hypertension: Secondary | ICD-10-CM | POA: Diagnosis not present

## 2022-12-28 DIAGNOSIS — J9621 Acute and chronic respiratory failure with hypoxia: Secondary | ICD-10-CM | POA: Diagnosis not present

## 2023-01-01 DIAGNOSIS — J9611 Chronic respiratory failure with hypoxia: Secondary | ICD-10-CM | POA: Diagnosis not present

## 2023-01-01 DIAGNOSIS — I5032 Chronic diastolic (congestive) heart failure: Secondary | ICD-10-CM | POA: Diagnosis not present

## 2023-01-01 DIAGNOSIS — I10 Essential (primary) hypertension: Secondary | ICD-10-CM | POA: Diagnosis not present

## 2023-01-08 DIAGNOSIS — I10 Essential (primary) hypertension: Secondary | ICD-10-CM | POA: Diagnosis not present

## 2023-01-14 DIAGNOSIS — I10 Essential (primary) hypertension: Secondary | ICD-10-CM | POA: Diagnosis not present

## 2023-01-22 DIAGNOSIS — R001 Bradycardia, unspecified: Secondary | ICD-10-CM | POA: Diagnosis not present

## 2023-01-24 DIAGNOSIS — J441 Chronic obstructive pulmonary disease with (acute) exacerbation: Secondary | ICD-10-CM | POA: Diagnosis not present

## 2023-01-30 DIAGNOSIS — I10 Essential (primary) hypertension: Secondary | ICD-10-CM | POA: Diagnosis not present

## 2023-02-01 DIAGNOSIS — I10 Essential (primary) hypertension: Secondary | ICD-10-CM | POA: Diagnosis not present

## 2023-02-13 DIAGNOSIS — R001 Bradycardia, unspecified: Secondary | ICD-10-CM | POA: Diagnosis not present

## 2023-03-26 DIAGNOSIS — R918 Other nonspecific abnormal finding of lung field: Secondary | ICD-10-CM | POA: Diagnosis not present

## 2023-05-23 DIAGNOSIS — R109 Unspecified abdominal pain: Secondary | ICD-10-CM | POA: Diagnosis not present

## 2023-08-23 DIAGNOSIS — R051 Acute cough: Secondary | ICD-10-CM | POA: Diagnosis not present

## 2023-10-04 ENCOUNTER — Inpatient Hospital Stay
Admission: EM | Admit: 2023-10-04 | Discharge: 2023-10-08 | DRG: 291 | Disposition: A | Discharge status: Skilled Nursing Facility | Source: Skilled Nursing Facility | Attending: Internal Medicine | Admitting: Internal Medicine

## 2023-10-04 ENCOUNTER — Emergency Department

## 2023-10-04 ENCOUNTER — Other Ambulatory Visit: Payer: Self-pay

## 2023-10-04 DIAGNOSIS — D638 Anemia in other chronic diseases classified elsewhere: Secondary | ICD-10-CM | POA: Diagnosis present

## 2023-10-04 DIAGNOSIS — I739 Peripheral vascular disease, unspecified: Secondary | ICD-10-CM | POA: Diagnosis not present

## 2023-10-04 DIAGNOSIS — R739 Hyperglycemia, unspecified: Secondary | ICD-10-CM | POA: Diagnosis present

## 2023-10-04 DIAGNOSIS — Z7901 Long term (current) use of anticoagulants: Secondary | ICD-10-CM

## 2023-10-04 DIAGNOSIS — R579 Shock, unspecified: Secondary | ICD-10-CM | POA: Diagnosis not present

## 2023-10-04 DIAGNOSIS — J9602 Acute respiratory failure with hypercapnia: Principal | ICD-10-CM

## 2023-10-04 DIAGNOSIS — I724 Aneurysm of artery of lower extremity: Secondary | ICD-10-CM | POA: Diagnosis not present

## 2023-10-04 DIAGNOSIS — J449 Chronic obstructive pulmonary disease, unspecified: Secondary | ICD-10-CM

## 2023-10-04 DIAGNOSIS — I110 Hypertensive heart disease with heart failure: Principal | ICD-10-CM | POA: Diagnosis present

## 2023-10-04 DIAGNOSIS — R680 Hypothermia, not associated with low environmental temperature: Secondary | ICD-10-CM | POA: Diagnosis not present

## 2023-10-04 DIAGNOSIS — Z515 Encounter for palliative care: Secondary | ICD-10-CM

## 2023-10-04 DIAGNOSIS — R0989 Other specified symptoms and signs involving the circulatory and respiratory systems: Secondary | ICD-10-CM | POA: Diagnosis not present

## 2023-10-04 DIAGNOSIS — Z91199 Patient's noncompliance with other medical treatment and regimen due to unspecified reason: Secondary | ICD-10-CM

## 2023-10-04 DIAGNOSIS — I5033 Acute on chronic diastolic (congestive) heart failure: Secondary | ICD-10-CM | POA: Diagnosis present

## 2023-10-04 DIAGNOSIS — K219 Gastro-esophageal reflux disease without esophagitis: Secondary | ICD-10-CM | POA: Diagnosis not present

## 2023-10-04 DIAGNOSIS — I2510 Atherosclerotic heart disease of native coronary artery without angina pectoris: Secondary | ICD-10-CM | POA: Diagnosis not present

## 2023-10-04 DIAGNOSIS — I2582 Chronic total occlusion of coronary artery: Secondary | ICD-10-CM | POA: Diagnosis not present

## 2023-10-04 DIAGNOSIS — N400 Enlarged prostate without lower urinary tract symptoms: Secondary | ICD-10-CM | POA: Diagnosis present

## 2023-10-04 DIAGNOSIS — E785 Hyperlipidemia, unspecified: Secondary | ICD-10-CM

## 2023-10-04 DIAGNOSIS — Z888 Allergy status to other drugs, medicaments and biological substances status: Secondary | ICD-10-CM

## 2023-10-04 DIAGNOSIS — I824Y2 Acute embolism and thrombosis of unspecified deep veins of left proximal lower extremity: Secondary | ICD-10-CM

## 2023-10-04 DIAGNOSIS — Z66 Do not resuscitate: Secondary | ICD-10-CM | POA: Diagnosis not present

## 2023-10-04 DIAGNOSIS — R197 Diarrhea, unspecified: Secondary | ICD-10-CM | POA: Diagnosis present

## 2023-10-04 DIAGNOSIS — I959 Hypotension, unspecified: Secondary | ICD-10-CM

## 2023-10-04 DIAGNOSIS — Z87891 Personal history of nicotine dependence: Secondary | ICD-10-CM

## 2023-10-04 DIAGNOSIS — J9622 Acute and chronic respiratory failure with hypercapnia: Secondary | ICD-10-CM | POA: Diagnosis not present

## 2023-10-04 DIAGNOSIS — R0902 Hypoxemia: Secondary | ICD-10-CM | POA: Diagnosis not present

## 2023-10-04 DIAGNOSIS — R9082 White matter disease, unspecified: Secondary | ICD-10-CM | POA: Diagnosis not present

## 2023-10-04 DIAGNOSIS — G4733 Obstructive sleep apnea (adult) (pediatric): Secondary | ICD-10-CM | POA: Diagnosis not present

## 2023-10-04 DIAGNOSIS — Z1152 Encounter for screening for COVID-19: Secondary | ICD-10-CM

## 2023-10-04 DIAGNOSIS — J9601 Acute respiratory failure with hypoxia: Principal | ICD-10-CM | POA: Diagnosis present

## 2023-10-04 DIAGNOSIS — T68XXXA Hypothermia, initial encounter: Secondary | ICD-10-CM

## 2023-10-04 DIAGNOSIS — F32A Depression, unspecified: Secondary | ICD-10-CM | POA: Diagnosis present

## 2023-10-04 DIAGNOSIS — F209 Schizophrenia, unspecified: Secondary | ICD-10-CM | POA: Diagnosis present

## 2023-10-04 DIAGNOSIS — I4891 Unspecified atrial fibrillation: Secondary | ICD-10-CM | POA: Diagnosis not present

## 2023-10-04 DIAGNOSIS — Z79899 Other long term (current) drug therapy: Secondary | ICD-10-CM

## 2023-10-04 DIAGNOSIS — R404 Transient alteration of awareness: Secondary | ICD-10-CM | POA: Diagnosis not present

## 2023-10-04 DIAGNOSIS — Z86718 Personal history of other venous thrombosis and embolism: Secondary | ICD-10-CM

## 2023-10-04 DIAGNOSIS — R918 Other nonspecific abnormal finding of lung field: Secondary | ICD-10-CM | POA: Diagnosis not present

## 2023-10-04 DIAGNOSIS — J9621 Acute and chronic respiratory failure with hypoxia: Secondary | ICD-10-CM

## 2023-10-04 DIAGNOSIS — I82409 Acute embolism and thrombosis of unspecified deep veins of unspecified lower extremity: Secondary | ICD-10-CM | POA: Diagnosis present

## 2023-10-04 DIAGNOSIS — Z825 Family history of asthma and other chronic lower respiratory diseases: Secondary | ICD-10-CM

## 2023-10-04 DIAGNOSIS — G9341 Metabolic encephalopathy: Secondary | ICD-10-CM | POA: Diagnosis present

## 2023-10-04 HISTORY — DX: Chronic obstructive pulmonary disease, unspecified: J44.9

## 2023-10-04 LAB — TROPONIN I (HIGH SENSITIVITY)
Troponin I (High Sensitivity): 18 ng/L — ABNORMAL HIGH (ref <18)
Troponin I (High Sensitivity): 15 ng/L (ref <18)

## 2023-10-04 LAB — RESP PANEL BY RT-PCR (RSV, FLU A&B, COVID)  RVPGX2
Influenza A by PCR: NEGATIVE
Influenza B by PCR: NEGATIVE
Resp Syncytial Virus by PCR: NEGATIVE
SARS Coronavirus 2 by RT PCR: NEGATIVE

## 2023-10-04 LAB — URINALYSIS, ROUTINE W REFLEX MICROSCOPIC
Bilirubin Urine: NEGATIVE
Glucose, UA: NEGATIVE mg/dL
Hgb urine dipstick: NEGATIVE
Ketones, ur: NEGATIVE mg/dL
Leukocytes,Ua: NEGATIVE
Nitrite: NEGATIVE
Protein, ur: NEGATIVE mg/dL
Specific Gravity, Urine: 1.014 (ref 1.005–1.030)
pH: 6 (ref 5.0–8.0)

## 2023-10-04 LAB — CBC WITH DIFFERENTIAL/PLATELET
Abs Immature Granulocytes: 0.01 K/uL (ref 0.00–0.07)
Basophils Absolute: 0 K/uL (ref 0.0–0.1)
Basophils Relative: 1 %
Eosinophils Absolute: 0.1 K/uL (ref 0.0–0.5)
Eosinophils Relative: 1 %
HCT: 41.6 % (ref 39.0–52.0)
Hemoglobin: 12.4 g/dL — ABNORMAL LOW (ref 13.0–17.0)
Immature Granulocytes: 0 %
Lymphocytes Relative: 20 %
Lymphs Abs: 0.9 K/uL (ref 0.7–4.0)
MCH: 28.2 pg (ref 26.0–34.0)
MCHC: 29.8 g/dL — ABNORMAL LOW (ref 30.0–36.0)
MCV: 94.8 fL (ref 80.0–100.0)
Monocytes Absolute: 0.5 K/uL (ref 0.1–1.0)
Monocytes Relative: 11 %
Neutro Abs: 3 K/uL (ref 1.7–7.7)
Neutrophils Relative %: 67 %
Platelets: 157 K/uL (ref 150–400)
RBC: 4.39 MIL/uL (ref 4.22–5.81)
RDW: 14 % (ref 11.5–15.5)
WBC: 4.4 K/uL (ref 4.0–10.5)
nRBC: 0 % (ref 0.0–0.2)

## 2023-10-04 LAB — BLOOD GAS, ARTERIAL
Acid-Base Excess: 13.5 mmol/L — ABNORMAL HIGH (ref 0.0–2.0)
Bicarbonate: 41.5 mmol/L — ABNORMAL HIGH (ref 20.0–28.0)
O2 Content: 4 L/min
O2 Saturation: 100 %
Patient temperature: 37
pCO2 arterial: 67 mmHg (ref 32–48)
pH, Arterial: 7.4 (ref 7.35–7.45)
pO2, Arterial: 144 mmHg — ABNORMAL HIGH (ref 83–108)

## 2023-10-04 LAB — MRSA NEXT GEN BY PCR, NASAL: MRSA by PCR Next Gen: NOT DETECTED

## 2023-10-04 LAB — COMPREHENSIVE METABOLIC PANEL WITH GFR
ALT: 14 U/L (ref 0–44)
AST: 14 U/L — ABNORMAL LOW (ref 15–41)
Albumin: 3.5 g/dL (ref 3.5–5.0)
Alkaline Phosphatase: 77 U/L (ref 38–126)
Anion gap: 12 (ref 5–15)
BUN: 22 mg/dL (ref 8–23)
CO2: 38 mmol/L — ABNORMAL HIGH (ref 22–32)
Calcium: 8.4 mg/dL — ABNORMAL LOW (ref 8.9–10.3)
Chloride: 95 mmol/L — ABNORMAL LOW (ref 98–111)
Creatinine, Ser: 1.09 mg/dL (ref 0.61–1.24)
GFR, Estimated: >60 mL/min (ref >60)
Glucose, Bld: 104 mg/dL — ABNORMAL HIGH (ref 70–99)
Potassium: 4.5 mmol/L (ref 3.5–5.1)
Sodium: 145 mmol/L (ref 135–145)
Total Bilirubin: 0.7 mg/dL (ref 0.0–1.2)
Total Protein: 7.4 g/dL (ref 6.5–8.1)

## 2023-10-04 LAB — GLUCOSE, CAPILLARY: Glucose-Capillary: 88 mg/dL (ref 70–99)

## 2023-10-04 LAB — BRAIN NATRIURETIC PEPTIDE: B Natriuretic Peptide: 608 pg/mL — ABNORMAL HIGH (ref 0.0–100.0)

## 2023-10-04 LAB — MAGNESIUM: Magnesium: 2.2 mg/dL (ref 1.7–2.4)

## 2023-10-04 MED ORDER — AMIODARONE HCL IN DEXTROSE 360-4.14 MG/200ML-% IV SOLN
30.0000 mg/h | INTRAVENOUS | Status: DC
Start: 2023-10-04 — End: 2023-10-07
  Administered 2023-10-04 – 2023-10-07 (×5): 30 mg/h via INTRAVENOUS
  Filled 2023-10-04 (×6): qty 200

## 2023-10-04 MED ORDER — FUROSEMIDE 10 MG/ML IJ SOLN
60.0000 mg | Freq: Once | INTRAMUSCULAR | Status: AC
  Administered 2023-10-04: 60 mg via INTRAVENOUS
  Filled 2023-10-04: qty 8

## 2023-10-04 MED ORDER — FUROSEMIDE 10 MG/ML IJ SOLN
40.0000 mg | Freq: Two times a day (BID) | INTRAMUSCULAR | Status: DC
  Administered 2023-10-05: 40 mg via INTRAVENOUS
  Filled 2023-10-04: qty 4

## 2023-10-04 MED ORDER — DIVALPROEX SODIUM ER 250 MG PO TB24
250.0000 mg | ORAL_TABLET | Freq: Every day | ORAL | Status: DC
  Administered 2023-10-05 – 2023-10-08 (×2): 250 mg via ORAL
  Filled 2023-10-04 (×4): qty 1

## 2023-10-04 MED ORDER — SODIUM CHLORIDE 0.9% FLUSH
3.0000 mL | Freq: Two times a day (BID) | INTRAVENOUS | Status: DC
  Administered 2023-10-04 – 2023-10-06 (×6): 3 mL via INTRAVENOUS

## 2023-10-04 MED ORDER — CHLORHEXIDINE GLUCONATE CLOTH 2 % EX PADS
6.0000 | MEDICATED_PAD | Freq: Every evening | CUTANEOUS | Status: DC
  Administered 2023-10-04 – 2023-10-06 (×3): 6 via TOPICAL

## 2023-10-04 MED ORDER — ACETAMINOPHEN 325 MG PO TABS: 650.0000 mg | ORAL_TABLET | Freq: Four times a day (QID) | ORAL | Status: DC | PRN

## 2023-10-04 MED ORDER — AMIODARONE HCL IN DEXTROSE 360-4.14 MG/200ML-% IV SOLN
60.0000 mg/h | INTRAVENOUS | Status: AC
  Administered 2023-10-04: 60 mg/h via INTRAVENOUS
  Filled 2023-10-04: qty 200

## 2023-10-04 MED ORDER — METOPROLOL TARTRATE 25 MG PO TABS
25.0000 mg | ORAL_TABLET | Freq: Once | ORAL | Status: AC
  Administered 2023-10-04: 25 mg via ORAL
  Filled 2023-10-04: qty 1

## 2023-10-04 MED ORDER — AMIODARONE LOAD VIA INFUSION
150.0000 mg | Freq: Once | INTRAVENOUS | Status: AC
  Administered 2023-10-04: 150 mg via INTRAVENOUS
  Filled 2023-10-04: qty 83.34

## 2023-10-04 MED ORDER — RIVAROXABAN 20 MG PO TABS
20.0000 mg | ORAL_TABLET | Freq: Every day | ORAL | Status: DC
  Administered 2023-10-04 – 2023-10-06 (×3): 20 mg via ORAL
  Filled 2023-10-04 (×5): qty 1

## 2023-10-04 MED ORDER — PANTOPRAZOLE SODIUM 40 MG PO TBEC
40.0000 mg | DELAYED_RELEASE_TABLET | Freq: Every day | ORAL | Status: DC
  Administered 2023-10-05: 40 mg via ORAL
  Filled 2023-10-04 (×2): qty 1

## 2023-10-04 MED ORDER — OLANZAPINE 10 MG PO TABS
30.0000 mg | ORAL_TABLET | Freq: Every day | ORAL | Status: DC
Start: 2023-10-04 — End: 2023-10-06
  Administered 2023-10-04 – 2023-10-05 (×2): 30 mg via ORAL
  Filled 2023-10-04 (×2): qty 3

## 2023-10-04 MED ORDER — SERTRALINE HCL 50 MG PO TABS
25.0000 mg | ORAL_TABLET | Freq: Every day | ORAL | Status: DC
  Administered 2023-10-05: 25 mg via ORAL
  Filled 2023-10-04 (×2): qty 1

## 2023-10-04 MED ORDER — ALBUTEROL SULFATE (2.5 MG/3ML) 0.083% IN NEBU: 2.5000 mg | INHALATION_SOLUTION | RESPIRATORY_TRACT | Status: DC | PRN

## 2023-10-04 MED ORDER — ORAL CARE MOUTH RINSE: 15.0000 mL | OROMUCOSAL | Status: DC | PRN

## 2023-10-04 MED ORDER — TIOTROPIUM BROMIDE MONOHYDRATE 18 MCG IN CAPS: 18.0000 ug | ORAL_CAPSULE | Freq: Every day | RESPIRATORY_TRACT | Status: DC

## 2023-10-04 MED ORDER — METOPROLOL TARTRATE 5 MG/5ML IV SOLN
5.0000 mg | Freq: Once | INTRAVENOUS | Status: DC
Start: 2023-10-04 — End: 2023-10-04

## 2023-10-04 MED ORDER — METOPROLOL SUCCINATE ER 50 MG PO TB24
25.0000 mg | ORAL_TABLET | Freq: Every day | ORAL | Status: DC
Start: 2023-10-05 — End: 2023-10-06
  Administered 2023-10-05: 25 mg via ORAL
  Filled 2023-10-04: qty 1

## 2023-10-04 MED ORDER — UMECLIDINIUM BROMIDE 62.5 MCG/ACT IN AEPB
1.0000 | INHALATION_SPRAY | Freq: Every day | RESPIRATORY_TRACT | Status: DC
  Administered 2023-10-05: 1 via RESPIRATORY_TRACT
  Filled 2023-10-04 (×2): qty 7

## 2023-10-04 MED ORDER — TAMSULOSIN HCL 0.4 MG PO CAPS
0.4000 mg | ORAL_CAPSULE | Freq: Every day | ORAL | Status: DC
  Administered 2023-10-04 – 2023-10-06 (×3): 0.4 mg via ORAL
  Filled 2023-10-04 (×3): qty 1

## 2023-10-04 MED ORDER — RIVAROXABAN 10 MG PO TABS
10.0000 mg | ORAL_TABLET | Freq: Every day | ORAL | Status: DC
  Filled 2023-10-04: qty 1

## 2023-10-04 MED ORDER — ACETAMINOPHEN 650 MG RE SUPP: 650.0000 mg | Freq: Four times a day (QID) | RECTAL | Status: DC | PRN

## 2023-10-04 NOTE — Progress Notes (Signed)
 Patient admitted to ICU 3. Patient A&Ox2. Bed in lowest position. Fall safety plan reviewed. Full assessment to Epic. Skin assessment verified with [facility]. Unable to complete admission profile, pt confused.

## 2023-10-04 NOTE — ED Triage Notes (Signed)
 Pt bib EMS from [facility] due to AMS. EMS sts that facility was concerned for pt to have high co2. Per EMS pt is suppose to sleep with a bipap on at the facility however pt has not work it in some time due to them not having bipap for the pt. Pt wears 4l/min via [facility] during the day.

## 2023-10-04 NOTE — ED Provider Notes (Signed)
 [facility] Provider Note    Event Date/Time   First MD Initiated Contact with Patient 10/04/23 0957     (approximate)   History   AMS   HPI  Anthony Chambers is a 78 y.o. male who presents to the ED for evaluation of AMS   Patient presents to the ED via EMS from a local facility for evaluation of altered mentation.  Reportedly uses BiPAP at night and 4 L nasal cannula during the daytime for chronic respiratory failure.  Per EMS he did not use his BiPAP last night and they do not know the last time patient was provided BiPAP.  Call out was for altered mentation, history is limited as patient cannot verbalize any complaints.   Physical Exam   Triage Vital Signs: ED Triage Vitals [10/04/23 0955]  Encounter Vitals Group     BP      Girls Systolic BP Percentile      Girls Diastolic BP Percentile      Boys Systolic BP Percentile      Boys Diastolic BP Percentile      Pulse      Resp      Temp 98.1 F (36.7 C)     Temp Source Oral     SpO2      Weight 220 lb (99.8 kg)     Height 6' 3 (1.905 m)     Head Circumference      Pe[facility] Flow      Pain Score 0     Pain Loc      Pain Education      Exclude from Growth Chart     Most recent vital signs: Vitals:   10/04/23 1050 10/04/23 1430  BP:  112/72  Pulse:  93  Resp:  (!) 23  Temp:    SpO2: 94% 91%    General:   Obtunded initially, improving and awake/alert on reassessments after BiPAP CV:  Good peripheral perfusion.  Tachycardic and irregular Resp:  Tachypneic with poor airflow Abd:  No distention.  MSK:  No deformity noted.  Mild pitting peripheral edema Neuro:  No focal deficits appreciated. Other:     ED Results / Procedures / Treatments   Labs (all labs ordered are listed, but only abnormal results are displayed) Labs Reviewed  BLOOD GAS, ARTERIAL - Abnormal; Notable for the following components:      Result Value   pCO2 arterial 67 (*)    pO2, Arterial 144 (*)    Bicarbonate  41.5 (*)    Acid-Base Excess 13.5 (*)    All other components within normal limits  COMPREHENSIVE METABOLIC PANEL WITH GFR - Abnormal; Notable for the following components:   Chloride 95 (*)    CO2 38 (*)    Glucose, Bld 104 (*)    Calcium 8.4 (*)    AST 14 (*)    All other components within normal limits  CBC WITH DIFFERENTIAL/PLATELET - Abnormal; Notable for the following components:   Hemoglobin 12.4 (*)    MCHC 29.8 (*)    All other components within normal limits  BRAIN NATRIURETIC PEPTIDE - Abnormal; Notable for the following components:   B Natriuretic Peptide 608.0 (*)    All other components within normal limits  TROPONIN I (HIGH SENSITIVITY) - Abnormal; Notable for the following components:   Troponin I (High Sensitivity) 18 (*)    All other components within normal limits  RESP PANEL BY RT-PCR (RSV, FLU A&B, COVID)  RVPGX2  MAGNESIUM  URINALYSIS, ROUTINE W REFLEX MICROSCOPIC  TROPONIN I (HIGH SENSITIVITY)    EKG A-fib with RVR, rate 137 bpm.  Right bundle, no STEMI, poor quality  RADIOLOGY 1 view CXR interpreted by me with pulmonary vascular congestion CT head interpreted by me without evidence of acute intracranial pathology  Official radiology report(s): CT HEAD WO CONTRAST ( ) Result Date: 10/04/2023 EXAM: CT HEAD WITHOUT CONTRAST 10/04/2023 10:45:52 AM TECHNIQUE: CT of the head was performed without the administration of intravenous contrast. Automated exposure control, iterative reconstruction, and/or weight based adjustment of the mA/kV was utilized to reduce the radiation dose to as low as reasonably achievable. COMPARISON: CT of the head dated 08/07/2020. CLINICAL HISTORY: Nonfocal altered. FINDINGS: BRAIN AND VENTRICLES: Age-related atrophy and mild-to-moderate diffuse cerebral white matter disease. No acute hemorrhage. No evidence of acute infarct. No hydrocephalus. No extra-axial collection. No mass effect or midline shift. ORBITS: No acute abnormality.  SINUSES: Mucosal disease within the right maxillary sinus. SOFT TISSUES AND SKULL: No acute soft tissue abnormality. No skull fracture. IMPRESSION: 1. No acute intracranial abnormality. 2. Age-related atrophy and mild-to-moderate diffuse cerebral white matter disease. 3. Right maxillary sinus mucosal disease. Electronically signed by: Evalene Coho MD 10/04/2023 10:51 AM EDT RP Workstation: [id number]   DG Chest Portable 1 View Result Date: 10/04/2023 EXAM: 1 VIEW(S) XRAY OF THE CHEST 10/04/2023 10:14:00 AM COMPARISON: 08/07/2020 CLINICAL HISTORY: altered, hypoxic. Pt bib EMS from [facility] due to AMS. EMS sts that facility was concerned for pt to have high co2. Per EMS pt is suppose to sleep with a bipap on at the facility however pt has not work it in some time due to them not having bipap for the pt. Pt ; wears 4l/min via [place] during the day. FINDINGS: LUNGS AND PLEURA: Low lung volumes with bilateral interstitial prominence. Possible small right pleural effusion. No pulmonary edema. No pneumothorax. HEART AND MEDIASTINUM: Borderline cardiomegaly. BONES AND SOFT TISSUES: No acute osseous abnormality. IMPRESSION: 1. Possible small right pleural effusion with low lung volumes and bilateral interstitial prominence. 2. Borderline cardiomegaly. Electronically signed by: Waddell Calk MD 10/04/2023 10:26 AM EDT RP Workstation: [id number]    PROCEDURES and INTERVENTIONS:  .Critical Care  Performed by: Claudene Rover, MD Authorized by: Claudene Rover, MD   Critical care provider statement:    Critical care time (minutes):  30   Critical care time was exclusive of:  Separately billable procedures and treating other patients   Critical care was necessary to treat or prevent imminent or life-threatening deterioration of the following conditions:  Respiratory failure   Critical care was time spent personally by me on the following activities:  Development of treatment plan with patient or surrogate,  discussions with consultants, evaluation of patient's response to treatment, examination of patient, ordering and review of laboratory studies, ordering and review of radiographic studies, ordering and performing treatments and interventions, pulse oximetry, re-evaluation of patient's condition and review of old charts .1-3 Lead EKG Interpretation  Performed by: Claudene Rover, MD Authorized by: Claudene Rover, MD     Interpretation: abnormal     ECG rate:  131   ECG rate assessment: tachycardic     Rhythm: atrial fibrillation     Ectopy: none     Conduction: normal     Medications  furosemide  (LASIX ) injection 60 mg (has no administration in time range)  metoprolol  tartrate (LOPRESSOR ) tablet 25 mg (has no administration in time range)  furosemide  (LASIX ) injection 40 mg (has no administration in time  range)  sodium chloride  flush (NS) 0.9 % injection 3 mL (has no administration in time range)  acetaminophen  (TYLENOL ) tablet 650 mg (has no administration in time range)    Or  acetaminophen  (TYLENOL ) suppository 650 mg (has no administration in time range)  amiodarone  (NEXTERONE  PREMIX) 360-4.14 MG/200ML-% (1.8 mg/mL) IV infusion (60 mg/hr Intravenous New Bag/Given 10/04/23 1415)    Followed by  amiodarone  (NEXTERONE  PREMIX) 360-4.14 MG/200ML-% (1.8 mg/mL) IV infusion (has no administration in time range)  metoprolol  succinate (TOPROL -XL) 24 hr tablet 25 mg (has no administration in time range)  OLANZapine  (ZYPREXA ) tablet 30 mg (has no administration in time range)  pantoprazole  (PROTONIX ) EC tablet 40 mg (has no administration in time range)  tamsulosin  (FLOMAX ) capsule 0.4 mg (has no administration in time range)  rivaroxaban  (XARELTO ) tablet 20 mg (has no administration in time range)  albuterol  (PROVENTIL ) (2.5 MG/3ML) 0.083% nebulizer solution 2.5 mg (has no administration in time range)  divalproex  (DEPAKOTE  ER) 24 hr tablet 250 mg (has no administration in time range)  sertraline   (ZOLOFT ) tablet 25 mg (has no administration in time range)  umeclidinium bromide  (INCRUSE ELLIPTA ) 62.5 MCG/ACT 1 puff (has no administration in time range)  amiodarone  (NEXTERONE ) 1.8 mg/mL load via infusion 150 mg (150 mg Intravenous Bolus from Bag 10/04/23 1415)     IMPRESSION / MDM / ASSESSMENT AND PLAN / ED COURSE  I reviewed the triage vital signs and the nursing notes.  Differential diagnosis includes, but is not limited to,   {Patient presents with symptoms of an acute illness or injury that is potentially life-threatening.  Patient presents from local SNF obtunded with signs of hypercapnic respiratory failure, improving with BiPAP but ultimately requiring admission due to persistent A-fib with RVR and signs of CHF exacerbation/volume overload.  Nonfocal exam with depressed mental status resolving with improved ventilation on BiPAP.  First ABG with hypercarbic failure.  Elevated BNP and persistent A-fib with RVR.  Intact renal function.  No signs of pneumonia or superimposed infection.  Initiate IV and oral metoprolol , IV diuresis and consult with medicine for admission  Clinical Course as of 10/04/23 1458  Fri Oct 04, 2023  1035 Elevated CO2, will trial BiPAP as we get the rest of the workup [DS]  1214 Reassessed while patient is on BiPAP, he looks significantly improved from presentation and reports feeling much better and is appreciative.  Remains in rapid A-fib and alongside his congested CXR, elevated BNP I suspect he needs further diuresis.  Will consult with medicine for admission [DS]  1240 I consulted medicine who agrees to admit [DS]    Clinical Course User Index [DS] Claudene Rover, MD     FINAL CLINICAL IMPRESSION(S) / ED DIAGNOSES   Final diagnoses:  Acute respiratory failure with hypoxia and hypercapnia (HCC)  Atrial fibrillation with RVR (HCC)     Rx / DC Orders   ED Discharge Orders     None        Note:  This document was prepared using Dragon voice  recognition software and may include unintentional dictation errors.   Claudene Rover, MD 10/04/23 [phone]

## 2023-10-04 NOTE — Plan of Care (Signed)
  Problem: Education: Goal: Ability to demonstrate management of disease process will improve Outcome: Progressing   Problem: Cardiac: Goal: Ability to achieve and maintain adequate cardiopulmonary perfusion will improve Outcome: Progressing   Problem: Education: Goal: Knowledge of General Education information will improve Description: Including pain rating scale, medication(s)/side effects and non-pharmacologic comfort measures Outcome: Progressing   Problem: Clinical Measurements: Goal: Ability to maintain clinical measurements within normal limits will improve Outcome: Progressing

## 2023-10-04 NOTE — H&P (Addendum)
 History and Physical   Anthony Chambers FMW:[id number] DOB: [birth year] DOA: 10/04/2023  PCP: Eilleen Query, MD   Patient coming from: [facility]  Chief Complaint: Overall status  HPI: Anthony Chambers is a 78 y.o. male with medical history significant of hyperlipidemia, GERD, PAD, popliteal artery aneurysm s/p repair, chronic diastolic CHF, COPD, OSA on BiPAP, chronic respiratory failure with hypoxia, DVT presenting with altered mental status.  History obtained with assistance of chart review.  Patient was noted to be altered this morning.  EMS was called.  Patient typically uses BiPAP at night and 4 L during the day due to his chronic respiratory failure and OSA.  Did not have BiPAP overnight for unclear reason and unclear if he has been without it for any longer than that.  Patient more alert now in the ED after BiPAP.  Still appears somewhat altered and chart reveals history of schizophrenia but is currently medicated.  Unclear baseline.  Does indicate that he had some issues with his BiPAP previously but unclear how long.  Denies specific complaints but not able to fully participate in review of systems.  ED Course: Vital signs in ED notable for heart rate in the 130s, respiratory rate in the 20s, blood pressure in the 110s-130 systolic.  Requiring BiPAP initially.  Lab workup included CMP with chloride 95, bicarb 38, glucose 104, calcium 8.4.  CBC with hemoglobin stable at 12.4.  Troponin 18, repeat pending.  BNP elevated to 608.  Respiratory panel negative for flu COVID and RSV.  Magnesium normal.  Urinalysis pending.  ABG with normal pH with pCO2 elevated to 67.  Chest x-ray showed low lung volume, bilateral interstitial prominence, questionable right pleural effusion, no evidence of pulmonary edema.  CT head showed no acute abnormality.  Patient received 5 mg IV metoprolol , 25 mg p.o. metoprolol , 60 mg IV Lasix , BiPAP in the ED.  Review of Systems: Denies specific complaints but  not able to fully participate in review of systems.  Past Medical History:  Diagnosis Date   Acute on chronic respiratory failure with hypoxia and hypercapnia (HCC) 08/08/2020   Acute respiratory failure with hypoxia and hypercapnia (HCC) 06/01/2018   Depression    GERD (gastroesophageal reflux disease)    Hyperlipidemia    Hypertension    Schizophrenia (HCC)    Total occlusion of coronary artery, chronic     History reviewed. No pertinent surgical history.  Social History  reports that he has quit smoking. His smoking use included cigarettes. He has never used smokeless tobacco. He reports that he does not currently use alcohol. He reports that he does not use drugs.  Allergies  Allergen Reactions   Heparin     Family History  Problem Relation Age of Onset   Asthma Mother   Reviewed on admission  Prior to Admission medications   Medication Sig Start Date End Date Taking? Authorizing Provider  acetaminophen  (TYLENOL ) 325 MG tablet Take 650 mg by mouth every 4 (four) hours as needed for mild pain or moderate pain.    [provider]  acetaminophen  (TYLENOL ) 500 MG tablet Take 1,000 mg by mouth at bedtime.    [provider]  albuterol  (VENTOLIN  HFA) 108 (90 Base) MCG/ACT inhaler Inhale 2 puffs into the lungs every 4 (four) hours as needed for wheezing or shortness of breath.    [provider]  ergocalciferol (VITAMIN D2) 1.25 MG (50000 UT) capsule Take 50,000 Units by mouth once a week. Every wednesday  [provider]  furosemide  (LASIX ) 40 MG tablet Take 40 mg by mouth daily. 03/24/19   [provider]  Ipratropium-Albuterol  (COMBIVENT RESPIMAT) 20-100 MCG/ACT AERS respimat Inhale 1 puff into the lungs in the morning, at noon, and at bedtime. And q 4 hrs prn    [provider]  metoprolol  succinate (TOPROL -XL) 25 MG 24 hr tablet Take 1 tablet (25 mg total) by mouth daily. Take with or immediately following a meal. 08/11/20    Danford, Lonni SQUIBB, MD  MIRALAX 17 g packet Take 17 g by mouth daily. 02/21/21   [provider]  OLANZapine  (ZYPREXA ) 15 MG tablet Take 30 mg by mouth daily.    [provider]  omeprazole (PRILOSEC) 20 MG capsule Take 20 mg by mouth daily.    [provider]  OXYGEN Inhale 4 L/min into the lungs continuous.    [provider]  risperiDONE  (RISPERDAL ) 1 MG tablet Take 1 mg by mouth 2 (two) times daily.     [provider]  rivaroxaban  (XARELTO ) 20 MG TABS tablet Take 20 mg by mouth daily.    [provider]  saccharomyces boulardii (FLORASTOR) 250 MG capsule Take 250 mg by mouth 2 (two) times daily. Patient not taking: Reported on 02/24/2021    [provider]  sennosides-docusate sodium  (SENOKOT-S) 8.6-50 MG tablet Take 1 tablet by mouth in the morning and at bedtime.    [provider]  simvastatin  (ZOCOR ) 20 MG tablet Take 20 mg by mouth at bedtime.    [provider]  tamsulosin  (FLOMAX ) 0.4 MG CAPS capsule Take 1 capsule (0.4 mg total) by mouth daily after supper. 03/27/19   Nieves Cough, MD  triamcinolone cream (KENALOG) 0.5 % Apply 1 application topically 2 (two) times daily. Patient not taking: Reported on 02/24/2021    [provider]    Physical Exam: Vitals:   10/04/23 0955 10/04/23 1004 10/04/23 1032 10/04/23 1050  BP:  118/69    Pulse:  (!) 130    Resp:  (!) 26    Temp: 98.1 F (36.7 C)     TempSrc: Oral     SpO2:  100% 100% 94%  Weight: 99.8 kg     Height: 6' 3 (1.905 m)       Physical Exam Constitutional:      General: He is not in acute distress.    Appearance: Normal appearance.  HENT:     Head: Normocephalic and atraumatic.     Mouth/Throat:     Mouth: Mucous membranes are moist.     Pharynx: Oropharynx is clear.  Eyes:     Extraocular Movements: Extraocular movements intact.     Pupils: Pupils are equal, round, and reactive to light.  Cardiovascular:     Rate  and Rhythm: Tachycardia present. Rhythm irregular.     Pulses: Normal pulses.     Heart sounds: Normal heart sounds.  Pulmonary:     Effort: Pulmonary effort is normal. No respiratory distress.     Breath sounds: Decreased breath sounds present.  Abdominal:     General: Bowel sounds are normal. There is no distension.     Palpations: Abdomen is soft.     Tenderness: There is no abdominal tenderness.  Musculoskeletal:        General: No swelling or deformity.     Right lower leg: Edema present.     Left lower leg: Edema present.  Skin:    General: Skin is warm and dry.  Neurological:     General: No focal deficit present.     Mental Status: He is disoriented.    Labs on Admission: I have personally reviewed following labs and imaging studies  CBC: Recent Labs  Lab 10/04/23 1005  WBC 4.4  NEUTROABS 3.0  HGB 12.4*  HCT 41.6  MCV 94.8  PLT 157    Basic Metabolic Panel: Recent Labs  Lab 10/04/23 1005  NA 145  K 4.5  CL 95*  CO2 38*  GLUCOSE 104*  BUN 22  CREATININE 1.09  CALCIUM 8.4*  MG 2.2    GFR: Estimated Creatinine Clearance: 68.9 mL/min (by C-G formula based on SCr of 1.09 mg/dL).  Liver Function Tests: Recent Labs  Lab 10/04/23 1005  AST 14*  ALT 14  ALKPHOS 77  BILITOT 0.7  PROT 7.4  ALBUMIN 3.5    Urine analysis: No results found for: COLORURINE, APPEARANCEUR, LABSPEC, PHURINE, GLUCOSEU, HGBUR, BILIRUBINUR, KETONESUR, PROTEINUR, UROBILINOGEN, NITRITE, LEUKOCYTESUR  Radiological Exams on Admission: CT HEAD WO CONTRAST ( ) Result Date: 10/04/2023 EXAM: CT HEAD WITHOUT CONTRAST 10/04/2023 10:45:52 AM TECHNIQUE: CT of the head was performed without the administration of intravenous contrast. Automated exposure control, iterative reconstruction, and/or weight based adjustment of the mA/kV was utilized to reduce the radiation dose to as low as reasonably achievable. COMPARISON: CT of the head dated 08/07/2020. CLINICAL  HISTORY: Nonfocal altered. FINDINGS: BRAIN AND VENTRICLES: Age-related atrophy and mild-to-moderate diffuse cerebral white matter disease. No acute hemorrhage. No evidence of acute infarct. No hydrocephalus. No extra-axial collection. No mass effect or midline shift. ORBITS: No acute abnormality. SINUSES: Mucosal disease within the right maxillary sinus. SOFT TISSUES AND SKULL: No acute soft tissue abnormality. No skull fracture. IMPRESSION: 1. No acute intracranial abnormality. 2. Age-related atrophy and mild-to-moderate diffuse cerebral white matter disease. 3. Right maxillary sinus mucosal disease. Electronically signed by: Evalene Coho MD 10/04/2023 10:51 AM EDT RP Workstation: [id number]   DG Chest Portable 1 View Result Date: 10/04/2023 EXAM: 1 VIEW(S) XRAY OF THE CHEST 10/04/2023 10:14:00 AM COMPARISON: 08/07/2020 CLINICAL HISTORY: altered, hypoxic. Pt bib EMS from [facility] due to AMS. EMS sts that facility was concerned for pt to have high co2. Per EMS pt is suppose to sleep with a bipap on at the facility however pt has not work it in some time due to them not having bipap for the pt. Pt ; wears 4l/min via [place] during the day. FINDINGS: LUNGS AND PLEURA: Low lung volumes with bilateral interstitial prominence. Possible small right pleural effusion. No pulmonary edema. No pneumothorax. HEART AND MEDIASTINUM: Borderline cardiomegaly. BONES AND SOFT TISSUES: No acute osseous abnormality. IMPRESSION: 1. Possible small right pleural effusion with low lung volumes and bilateral interstitial prominence. 2. Borderline cardiomegaly. Electronically signed by: Waddell Calk MD 10/04/2023 10:26 AM EDT RP Workstation: [id number]   EKG: Independently reviewed.  Interpretation limited by quality.  Significant baseline wander and artifact.  Suspected atrial fibrillation with RVR at 137 bpm (less likely, possibly sinus tachycardia with frequent PACs though only a few possible P waves are discernible).  PVCs  noted.  Assessment/Plan Active Problems:   DVT (deep venous thrombosis) (HCC)   GERD (gastroesophageal reflux disease)   Hyperlipidemia   PVD (peripheral vascular disease)   Popliteal artery aneurysm   COPD (chronic obstructive pulmonary disease) (HCC)   Congestive heart failure (CHF) (HCC)   Acute encephalopathy Acute on chronic respiratory failure with hypoxia > Patient presented altered from facility after not having his BiPAP last night. > Found to  be hypercarbic and was transported to the ED where he was placed on BiPAP.  Mentation has improved on BiPAP, but still not fully oriented. > Chronically requires 4 L of oxygen at facility and BiPAP at night.  Currently being weaned off BiPAP. > Suspicion that some portion of this is due to being without BiPAP last night and also noted to have volume overload with BNP elevated to 608 and now apparent new atrial fibrillation with RVR. - Monitor in stepdown unit overnight - Continue to wean off BiPAP, will leave ordered as needed - Continue supplemental oxygen, wean as tolerated while awake - Address CHF, atrial fibrillation as below  Acute on chronic diastolic CHF > Volume overloaded on exam as well.  BNP elevated to 628. > Now in A-fib with RVR this may have been triggered by his hypoxia leading to CHF exacerbation. > Last echo was in 2022 with EF 55-60%, G1 DD, normal RV function. > BNP elevated to 608 in the ED.  Magnesium normal.  Received 60 mg IV Lasix . - Monitoring on stepdown for now - Continue with Lasix  40 mg IV twice daily - Repeat echocardiogram - Strict I's and O's, daily weights - Trend renal function and electrolytes  New Atrial fibrillation with RVR > Noted to be in A-fib with RVR in the ED.  Rate in the 130s. (less likely, possibly sinus tachycardia with frequent PACs though only a few possible P waves are discernible). > Has received 25 mg p.o. metoprolol  and 5 mg IV metoprolol . > Is on chronic anticoagulation at  facility.  Given this and acute CHF with no recent echo will start on amiodarone  infusion for RVR. - Monitor in stepdown unit for now - Amiodarone  load and infusion - Repeat EKG - Continue home Xarelto  - Echocardiogram as above  Schizophrenia > Unclear mentation at baseline.  Unable to reach sister or for care. - Receives Invega injections on the third Monday of every month. - Continue home olanzapine , Depakote , sertraline   Hyperlipidemia - Plan to continue home statin when able  GERD - Continue PPI when able  PAD Popliteal aneurysm > Status post repair popliteal aneurysm - On Xarelto  as above - Not currently on statin.  ?CAD/CTO > Facility paperwork showed CAD with chronic total occlusion as indication for Xarelto .  Unclear if this is a separate diagnosis not in our system or if it is a mistake. - Will need to work to confirm this diagnosis - Continue metoprolol , Xarelto  - Holding losartan with low normal blood pressure in setting of new A-fib with RVR/CHF exacerbation.  OSA - BiPAP as above  COPD - Continue home Spiriva , albuterol , oxygen  History of DVT  - Continue home Xarelto   DVT prophylaxis: Xarelto  Code Status:   Full  Family Communication:  Attempted to reach sister, Dorothe, by phone; But there was no answer.  Also attempted to reach her through her care, but there was no answer.  Disposition Plan:   Patient is from:  [facility]  Anticipated DC to:  As above  Anticipated DC date:  1 to 3 days  Anticipated DC barriers: None  Consults called:  None Admission status:  Observation, progressive,  Severity of Illness: The appropriate patient status for this patient is OBSERVATION. Observation status is judged to be reasonable and necessary in order to provide the required intensity of service to ensure the patient's safety. The patient's presenting symptoms, physical exam findings, and initial radiographic and laboratory data in the context of their medical  condition is felt to place them at decreased risk for further clinical deterioration. Furthermore, it is anticipated that the patient will be medically stable for discharge from the hospital within 2 midnights of admission.    Marsa KATHEE Scurry MD [facility]  How to contact the [facility] Attending or Consulting provider 7A - 7P or covering provider during after hours 7P -7A, for this patient?   Check the care team in [facility] and look for a) attending/consulting [facility] provider listed and b) the [facility] team listed Log into [email or web address] and use [place]'s universal password to access. If you do not have the password, please contact the hospital operator. Locate the [facility] provider you are looking for under [facility] and page to a number that you can be directly reached. If you still have difficulty reaching the provider, please page the [facility] (Director on Call) for the Hospitalists listed on amion for assistance.  10/04/2023, 12:37 PM

## 2023-10-04 NOTE — Progress Notes (Signed)
 [facility] Hospitalized Hospice Patient  [name] Chambers is a current hospice patient followed at [facility] for terminal diagnosis of Chronic Diastolic Heart Failure.   Staff at patient's facility called 911 due to patient having altered mental status.  Patient is was admitted to [facility] 9.26.25 with primary diagnosis of Acute encephalopathy with Acute on chronic respiratory failure with hypoxia.  Patient also has a new onset of Atrial Fib with RVR.   Per Dr. Norleen Chambers, this is a hospice related hospitalization.  Patient is appropriate for GIP level of care requiring Skilled care and management of his heart failure.  Vital Signs: T 98.1 oral, BP 118/69, P 130, R 26, Oxi 100% on BiPap  Abnormal Labs: HGB 12.4, CL 95, CO2 38, Glucose 104, Ca 8.4, AST 14  IV Meds/Prn Meds: Amiodarone  60mg /hour IV- continuous Lasix  60mg  IV x1  Diagnostics: Radiological Exams on Admission:  Imaging Results (Last 48 hours)  CT HEAD WO CONTRAST ( ) Result Date: 10/04/2023 EXAM: CT HEAD WITHOUT CONTRAST 10/04/2023 10:45:52 AM TECHNIQUE: CT of the head was performed without the administration of intravenous contrast. Automated exposure control, iterative reconstruction, and/or weight based adjustment of the mA/kV was utilized to reduce the radiation dose to as low as reasonably achievable. COMPARISON: CT of the head dated 08/07/2020. CLINICAL HISTORY: Nonfocal altered. FINDINGS: BRAIN AND VENTRICLES: Age-related atrophy and mild-to-moderate diffuse cerebral white matter disease. No acute hemorrhage. No evidence of acute infarct. No hydrocephalus. No extra-axial collection. No mass effect or midline shift. ORBITS: No acute abnormality. SINUSES: Mucosal disease within the right maxillary sinus. SOFT TISSUES AND SKULL: No acute soft tissue abnormality. No skull fracture. IMPRESSION: 1. No acute intracranial abnormality. 2. Age-related atrophy and mild-to-moderate diffuse cerebral white matter disease.  3. Right maxillary sinus mucosal disease. Electronically signed by: Anthony Coho MD 10/04/2023 10:51 AM EDT RP Workstation: [id number]    DG Chest Portable 1 View Result Date: 10/04/2023 EXAM: 1 VIEW(S) XRAY OF THE CHEST 10/04/2023 10:14:00 AM COMPARISON: 08/07/2020 CLINICAL HISTORY: altered, hypoxic. Pt bib EMS from [facility] due to AMS. EMS sts that facility was concerned for pt to have high co2. Per EMS pt is suppose to sleep with a bipap on at the facility however pt has not work it in some time due to them not having bipap for the pt. Pt ; wears 4l/min via [place] during the day. FINDINGS: LUNGS AND PLEURA: Low lung volumes with bilateral interstitial prominence. Possible small right pleural effusion. No pulmonary edema. No pneumothorax. HEART AND MEDIASTINUM: Borderline cardiomegaly. BONES AND SOFT TISSUES: No acute osseous abnormality. IMPRESSION: 1. Possible small right pleural effusion with low lung volumes and bilateral interstitial prominence. 2. Borderline cardiomegaly. Electronically signed by: Anthony Calk MD 10/04/2023 10:26 AM EDT RP Workstation: [id number]     EKG: Independently reviewed.  Interpretation limited by quality.  Significant baseline wander and artifact.  Suspected atrial fibrillation with RVR at 137 bpm (less likely, possibly sinus tachycardia with frequent PACs though only a few possible P waves are discernible).  PVCs noted.   Assessment/Plan-   Per Dr. Marsa Chambers H&P 9.26.25 Active Problems:   DVT (deep venous thrombosis) (HCC)   GERD (gastroesophageal reflux disease)   Hyperlipidemia   PVD (peripheral vascular disease)   Popliteal artery aneurysm   COPD (chronic obstructive pulmonary disease) (HCC)   Congestive heart failure (CHF) (HCC)   Acute encephalopathy Acute on chronic respiratory failure with hypoxia > Patient presented altered from facility after not having his BiPAP last night. > Found  to be hypercarbic and was transported to the ED where he  was placed on BiPAP.  Mentation has improved on BiPAP, but still not fully oriented. > Chronically requires 4 L of oxygen at facility and BiPAP at night.  Currently being weaned off BiPAP. > Suspicion that some portion of this is due to being without BiPAP last night and also noted to have volume overload with BNP elevated to 608 and now apparent new atrial fibrillation with RVR. - Monitor in stepdown unit overnight - Continue to wean off BiPAP, will leave ordered as needed - Continue supplemental oxygen, wean as tolerated while awake - Address CHF, atrial fibrillation as below   Acute on chronic diastolic CHF > Volume overloaded on exam as well.  BNP elevated to 628. > Now in A-fib with RVR this may have been triggered by his hypoxia leading to CHF exacerbation. > Last echo was in 2022 with EF 55-60%, G1 DD, normal RV function. > BNP elevated to 608 in the ED.  Magnesium normal.  Received 60 mg IV Lasix . - Monitoring on stepdown for now - Continue with Lasix  40 mg IV twice daily - Repeat echocardiogram - Strict I's and O's, daily weights - Trend renal function and electrolytes   New Atrial fibrillation with RVR > Noted to be in A-fib with RVR in the ED.  Rate in the 130s. (less likely, possibly sinus tachycardia with frequent PACs though only a few possible P waves are discernible). > Has received 25 mg p.o. metoprolol  and 5 mg IV metoprolol . > Is on chronic anticoagulation at facility.  Given this and acute CHF with no recent echo will start on amiodarone  infusion for RVR. - Monitor in stepdown unit for now - Amiodarone  load and infusion - Repeat EKG - Continue home Xarelto  - Echocardiogram as above   Schizophrenia > Unclear mentation at baseline.  Unable to reach sister or for care. - Receives Invega injections on the third Monday of every month. - Continue home olanzapine , Depakote , sertraline    Hyperlipidemia - Plan to continue home statin when able   GERD - Continue PPI  when able   PAD Popliteal aneurysm > Status post repair popliteal aneurysm - On Xarelto  as above - Not currently on statin.   ?CAD/CTO > Facility paperwork showed CAD with chronic total occlusion as indication for Xarelto .  Unclear if this is a separate diagnosis not in our system or if it is a mistake. - Will need to work to confirm this diagnosis - Continue metoprolol , Xarelto  - Holding losartan with low normal blood pressure in setting of new A-fib with RVR/CHF exacerbation.   OSA - BiPAP as above   COPD - Continue home Spiriva , albuterol , oxygen   History of DVT  - Continue home Xarelto  ________________________________________  Discharge Plan:  Ongoing Goals of Care-  Patient is a full code Family Contact- Non present-  IDT- updated  Hospice Detail Summary Report sent to [facility] for upload into patient's chart.  Hospital liaison team will continue to follow through hospitalization. Please do not hesitate to call with any hospice related questions or concerns.  Saddie HILARIO Na, RN Nurse Liaison [phone]

## 2023-10-05 ENCOUNTER — Observation Stay (HOSPITAL_COMMUNITY)
Admit: 2023-10-05 | Discharge: 2023-10-05 | Disposition: A | Discharge status: Home / Self Care | Attending: Internal Medicine | Admitting: Internal Medicine

## 2023-10-05 ENCOUNTER — Encounter: Payer: Self-pay | Admitting: Internal Medicine

## 2023-10-05 DIAGNOSIS — J9602 Acute respiratory failure with hypercapnia: Secondary | ICD-10-CM

## 2023-10-05 DIAGNOSIS — I5033 Acute on chronic diastolic (congestive) heart failure: Secondary | ICD-10-CM

## 2023-10-05 DIAGNOSIS — F209 Schizophrenia, unspecified: Secondary | ICD-10-CM | POA: Diagnosis present

## 2023-10-05 DIAGNOSIS — T68XXXA Hypothermia, initial encounter: Secondary | ICD-10-CM

## 2023-10-05 DIAGNOSIS — Z1152 Encounter for screening for COVID-19: Secondary | ICD-10-CM | POA: Diagnosis not present

## 2023-10-05 DIAGNOSIS — R4182 Altered mental status, unspecified: Secondary | ICD-10-CM | POA: Diagnosis not present

## 2023-10-05 DIAGNOSIS — J9690 Respiratory failure, unspecified, unspecified whether with hypoxia or hypercapnia: Secondary | ICD-10-CM | POA: Diagnosis not present

## 2023-10-05 DIAGNOSIS — T68XXXS Hypothermia, sequela: Secondary | ICD-10-CM | POA: Diagnosis not present

## 2023-10-05 DIAGNOSIS — I739 Peripheral vascular disease, unspecified: Secondary | ICD-10-CM | POA: Diagnosis not present

## 2023-10-05 DIAGNOSIS — Z8673 Personal history of transient ischemic attack (TIA), and cerebral infarction without residual deficits: Secondary | ICD-10-CM | POA: Diagnosis not present

## 2023-10-05 DIAGNOSIS — I2510 Atherosclerotic heart disease of native coronary artery without angina pectoris: Secondary | ICD-10-CM | POA: Diagnosis present

## 2023-10-05 DIAGNOSIS — R579 Shock, unspecified: Secondary | ICD-10-CM | POA: Diagnosis present

## 2023-10-05 DIAGNOSIS — R918 Other nonspecific abnormal finding of lung field: Secondary | ICD-10-CM | POA: Diagnosis not present

## 2023-10-05 DIAGNOSIS — J449 Chronic obstructive pulmonary disease, unspecified: Secondary | ICD-10-CM | POA: Diagnosis not present

## 2023-10-05 DIAGNOSIS — I4891 Unspecified atrial fibrillation: Secondary | ICD-10-CM | POA: Diagnosis not present

## 2023-10-05 DIAGNOSIS — J9621 Acute and chronic respiratory failure with hypoxia: Secondary | ICD-10-CM | POA: Diagnosis not present

## 2023-10-05 DIAGNOSIS — K219 Gastro-esophageal reflux disease without esophagitis: Secondary | ICD-10-CM | POA: Diagnosis not present

## 2023-10-05 DIAGNOSIS — F32A Depression, unspecified: Secondary | ICD-10-CM | POA: Diagnosis present

## 2023-10-05 DIAGNOSIS — I5031 Acute diastolic (congestive) heart failure: Secondary | ICD-10-CM | POA: Diagnosis not present

## 2023-10-05 DIAGNOSIS — G9341 Metabolic encephalopathy: Secondary | ICD-10-CM | POA: Diagnosis not present

## 2023-10-05 DIAGNOSIS — I959 Hypotension, unspecified: Secondary | ICD-10-CM | POA: Diagnosis not present

## 2023-10-05 DIAGNOSIS — Z66 Do not resuscitate: Secondary | ICD-10-CM | POA: Diagnosis present

## 2023-10-05 DIAGNOSIS — N400 Enlarged prostate without lower urinary tract symptoms: Secondary | ICD-10-CM | POA: Diagnosis present

## 2023-10-05 DIAGNOSIS — J9622 Acute and chronic respiratory failure with hypercapnia: Secondary | ICD-10-CM | POA: Diagnosis not present

## 2023-10-05 DIAGNOSIS — D638 Anemia in other chronic diseases classified elsewhere: Secondary | ICD-10-CM | POA: Diagnosis present

## 2023-10-05 DIAGNOSIS — J9601 Acute respiratory failure with hypoxia: Secondary | ICD-10-CM

## 2023-10-05 DIAGNOSIS — Z515 Encounter for palliative care: Secondary | ICD-10-CM

## 2023-10-05 DIAGNOSIS — Z743 Need for continuous supervision: Secondary | ICD-10-CM | POA: Diagnosis not present

## 2023-10-05 DIAGNOSIS — F3289 Other specified depressive episodes: Secondary | ICD-10-CM

## 2023-10-05 DIAGNOSIS — I110 Hypertensive heart disease with heart failure: Secondary | ICD-10-CM | POA: Diagnosis present

## 2023-10-05 DIAGNOSIS — I2582 Chronic total occlusion of coronary artery: Secondary | ICD-10-CM | POA: Diagnosis present

## 2023-10-05 DIAGNOSIS — R569 Unspecified convulsions: Secondary | ICD-10-CM | POA: Diagnosis not present

## 2023-10-05 DIAGNOSIS — J90 Pleural effusion, not elsewhere classified: Secondary | ICD-10-CM | POA: Diagnosis not present

## 2023-10-05 DIAGNOSIS — J439 Emphysema, unspecified: Secondary | ICD-10-CM | POA: Diagnosis not present

## 2023-10-05 DIAGNOSIS — R0902 Hypoxemia: Secondary | ICD-10-CM | POA: Diagnosis not present

## 2023-10-05 DIAGNOSIS — R197 Diarrhea, unspecified: Secondary | ICD-10-CM | POA: Diagnosis not present

## 2023-10-05 DIAGNOSIS — Z7401 Bed confinement status: Secondary | ICD-10-CM | POA: Diagnosis not present

## 2023-10-05 DIAGNOSIS — R680 Hypothermia, not associated with low environmental temperature: Secondary | ICD-10-CM | POA: Diagnosis present

## 2023-10-05 DIAGNOSIS — R739 Hyperglycemia, unspecified: Secondary | ICD-10-CM | POA: Diagnosis present

## 2023-10-05 DIAGNOSIS — E785 Hyperlipidemia, unspecified: Secondary | ICD-10-CM | POA: Diagnosis present

## 2023-10-05 DIAGNOSIS — I9589 Other hypotension: Secondary | ICD-10-CM | POA: Diagnosis not present

## 2023-10-05 DIAGNOSIS — G4733 Obstructive sleep apnea (adult) (pediatric): Secondary | ICD-10-CM | POA: Diagnosis present

## 2023-10-05 LAB — COMPREHENSIVE METABOLIC PANEL WITH GFR
ALT: 10 U/L (ref 0–44)
ALT: 9 U/L (ref 0–44)
AST: 13 U/L — ABNORMAL LOW (ref 15–41)
AST: 14 U/L — ABNORMAL LOW (ref 15–41)
Albumin: 2.7 g/dL — ABNORMAL LOW (ref 3.5–5.0)
Albumin: 2.6 g/dL — ABNORMAL LOW (ref 3.5–5.0)
Alkaline Phosphatase: 60 U/L (ref 38–126)
Alkaline Phosphatase: 59 U/L (ref 38–126)
Anion gap: 12 (ref 5–15)
Anion gap: 10 (ref 5–15)
BUN: 26 mg/dL — ABNORMAL HIGH (ref 8–23)
BUN: 26 mg/dL — ABNORMAL HIGH (ref 8–23)
CO2: 36 mmol/L — ABNORMAL HIGH (ref 22–32)
CO2: 41 mmol/L — ABNORMAL HIGH (ref 22–32)
Calcium: 7.5 mg/dL — ABNORMAL LOW (ref 8.9–10.3)
Calcium: 7.8 mg/dL — ABNORMAL LOW (ref 8.9–10.3)
Chloride: 91 mmol/L — ABNORMAL LOW (ref 98–111)
Chloride: 92 mmol/L — ABNORMAL LOW (ref 98–111)
Creatinine, Ser: 0.99 mg/dL (ref 0.61–1.24)
Creatinine, Ser: 1.09 mg/dL (ref 0.61–1.24)
GFR, Estimated: >60 mL/min (ref >60)
GFR, Estimated: >60 mL/min (ref >60)
Glucose, Bld: 236 mg/dL — ABNORMAL HIGH (ref 70–99)
Glucose, Bld: 84 mg/dL (ref 70–99)
Potassium: 3.8 mmol/L (ref 3.5–5.1)
Potassium: 4.2 mmol/L (ref 3.5–5.1)
Sodium: 139 mmol/L (ref 135–145)
Sodium: 143 mmol/L (ref 135–145)
Total Bilirubin: 0.5 mg/dL (ref 0.0–1.2)
Total Bilirubin: 0.4 mg/dL (ref 0.0–1.2)
Total Protein: 6.1 g/dL — ABNORMAL LOW (ref 6.5–8.1)
Total Protein: 6 g/dL — ABNORMAL LOW (ref 6.5–8.1)

## 2023-10-05 LAB — BLOOD GAS, ARTERIAL
Acid-Base Excess: 20.8 mmol/L — ABNORMAL HIGH (ref 0.0–2.0)
Bicarbonate: 49.9 mmol/L — ABNORMAL HIGH (ref 20.0–28.0)
Expiratory PAP: 5 cmH2O
FIO2: 50 %
Inspiratory PAP: 10 cmH2O
O2 Saturation: 98.2 %
Patient temperature: 37
pCO2 arterial: 77 mmHg (ref 32–48)
pH, Arterial: 7.42 (ref 7.35–7.45)
pO2, Arterial: 75 mmHg — ABNORMAL LOW (ref 83–108)

## 2023-10-05 LAB — URINALYSIS, W/ REFLEX TO CULTURE (INFECTION SUSPECTED)
Bacteria, UA: NONE SEEN
Bilirubin Urine: NEGATIVE
Glucose, UA: NEGATIVE mg/dL
Hgb urine dipstick: NEGATIVE
Ketones, ur: NEGATIVE mg/dL
Leukocytes,Ua: NEGATIVE
Nitrite: NEGATIVE
Protein, ur: NEGATIVE mg/dL
RBC / HPF: 0 RBC/hpf (ref 0–5)
Specific Gravity, Urine: 1.008 (ref 1.005–1.030)
pH: 5 (ref 5.0–8.0)

## 2023-10-05 LAB — ECHOCARDIOGRAM COMPLETE
Height: 75 in
Weight: 3537.94 [oz_av]

## 2023-10-05 LAB — TROPONIN I (HIGH SENSITIVITY): Troponin I (High Sensitivity): 11 ng/L (ref <18)

## 2023-10-05 LAB — CBC
HCT: 34.3 % — ABNORMAL LOW (ref 39.0–52.0)
HCT: 34.1 % — ABNORMAL LOW (ref 39.0–52.0)
Hemoglobin: 10.7 g/dL — ABNORMAL LOW (ref 13.0–17.0)
Hemoglobin: 10.6 g/dL — ABNORMAL LOW (ref 13.0–17.0)
MCH: 29.1 pg (ref 26.0–34.0)
MCH: 28.3 pg (ref 26.0–34.0)
MCHC: 31.2 g/dL (ref 30.0–36.0)
MCHC: 31.1 g/dL (ref 30.0–36.0)
MCV: 93.2 fL (ref 80.0–100.0)
MCV: 91.2 fL (ref 80.0–100.0)
Platelets: 275 K/uL (ref 150–400)
Platelets: 133 K/uL — ABNORMAL LOW (ref 150–400)
RBC: 3.68 MIL/uL — ABNORMAL LOW (ref 4.22–5.81)
RBC: 3.74 MIL/uL — ABNORMAL LOW (ref 4.22–5.81)
RDW: 14 % (ref 11.5–15.5)
RDW: 14.2 % (ref 11.5–15.5)
WBC: 4.6 K/uL (ref 4.0–10.5)
WBC: 4.1 K/uL (ref 4.0–10.5)
nRBC: 0 % (ref 0.0–0.2)
nRBC: 0 % (ref 0.0–0.2)

## 2023-10-05 LAB — GLUCOSE, CAPILLARY
Glucose-Capillary: 107 mg/dL — ABNORMAL HIGH (ref 70–99)
Glucose-Capillary: 89 mg/dL (ref 70–99)

## 2023-10-05 LAB — PHOSPHORUS: Phosphorus: 3.8 mg/dL (ref 2.5–4.6)

## 2023-10-05 LAB — MAGNESIUM: Magnesium: 2.1 mg/dL (ref 1.7–2.4)

## 2023-10-05 LAB — LACTIC ACID, PLASMA: Lactic Acid, Venous: 0.9 mmol/L (ref 0.5–1.9)

## 2023-10-05 MED ORDER — SODIUM CHLORIDE 0.9 % IV SOLN: 250.0000 mL | INTRAVENOUS | Status: AC

## 2023-10-05 MED ORDER — FUROSEMIDE 10 MG/ML IJ SOLN: 40.0000 mg | Freq: Every day | INTRAMUSCULAR | Status: DC

## 2023-10-05 MED ORDER — IPRATROPIUM-ALBUTEROL 0.5-2.5 (3) MG/3ML IN SOLN
3.0000 mL | Freq: Four times a day (QID) | RESPIRATORY_TRACT | Status: DC
  Administered 2023-10-05 – 2023-10-08 (×11): 3 mL via RESPIRATORY_TRACT
  Filled 2023-10-05 (×12): qty 3

## 2023-10-05 MED ORDER — FUROSEMIDE 10 MG/ML IJ SOLN: 40.0000 mg | Freq: Two times a day (BID) | INTRAMUSCULAR | Status: DC

## 2023-10-05 MED ORDER — NOREPINEPHRINE 4 MG/250ML-% IV SOLN: 0.0000 ug/min | INTRAVENOUS | Status: DC

## 2023-10-05 NOTE — Assessment & Plan Note (Signed)
 Does not seem to be in exacerbation at this time.

## 2023-10-05 NOTE — Assessment & Plan Note (Signed)
 Unable to reach family for baseline mental status.  Reaching out to hospice liaison.

## 2023-10-05 NOTE — Progress Notes (Signed)
 [facility] Hospitalized Hospice Patient  [name] is a current hospice patient followed at [facility] for terminal diagnosis of Chronic Diastolic Heart Failure.   Staff at patient's facility called 911 due to patient having altered mental status.  Patient is was admitted to [facility] 9.26.25 with primary diagnosis of Acute encephalopathy with Acute on chronic respiratory failure with hypoxia.  Patient also has a new onset of Atrial Fib with RVR.   Per Dr. Norleen Chambers, this is a hospice related hospitalization.   [name] remains in ICU this morning on an amiodarone  gtt.  Communicated with Dr. Josette this morning regarding patient's prior status prior to this hospitalization.  [name] is a full code with hospice.  During hospice nurse visits- He is awake and oriented and involved in activities at [facility].  [name] remains appropriate for GIP level of care requiring ICU care to monitor effectiveness of medical interventions including amiodarone  gtt, BiPap and symptom management of shortness of breath.  Vital Signs: BP 112/79, P 77, R 15   Oxi 87% BiPap,  Fi02 50%  Abnormal labs: RBC 3.68, HGB 10.7, HCT 34.3,  IV Meds & PRN Lasix  40mg  IV BID Amiodarone  gtt  Diagnostics:  No new  Hospital Problem:  per Dr. Charlie Anthony Chambers Progress note 9.27.25 Acute on chronic diastolic CHF (congestive heart failure) (HCC) Will decrease IV Lasix  to daily dosing   Acute metabolic encephalopathy Unable to reach family for baseline mental status.  Reaching out to hospice liaison.   Acute on chronic respiratory failure with hypoxia and hypercapnia (HCC) As per nursing staff patient did not want to wear the BiPAP.  Will try BiPAP at night since he wears it at home.  Chronically wears 4 L of oxygen.  This morning on 6 L.   Hypothermia Needed warming blanket.  Send off blood cultures.  White blood cell count normal range.  Chest x-ray negative.  Hold off on antibiotics for right now.   Send another urine analysis and stool studies.   Atrial fibrillation with RVR (HCC) Patient on Toprol  and amiodarone  drip.  On Xarelto    Diarrhea Stool studies if further diarrhea   COPD (chronic obstructive pulmonary disease) (HCC) Does not seem to be in exacerbation at this time.   DVT (deep venous thrombosis) (HCC) On Xarelto    Depression On Zoloft    PVD (peripheral vascular disease) On Xarelto   _________________________________________  Discharge Plan:  back to [facility] when medically stable Goals of Care:  remains Full Code IDT:  updated Family Contact:  Anthony Chambers, patient's sister.  Called and spoke with Anthony via telephone.  Discussed patient's current status and discussed hospice philosophy. She states she has not spoken with anyone at the hospital.  She hopes to come see Anthony Chambers today.  She request Dr. Josette call her.  Information passed along to Dr. Josette via Epic Chat.  Please do not hesitate to call with any hospice related questions or concerns.  Anthony Chambers Na, Anthony Chambers Nurse Liaison [phone]

## 2023-10-05 NOTE — Assessment & Plan Note (Signed)
 On Zoloft 

## 2023-10-05 NOTE — Plan of Care (Signed)
  Problem: Education: Goal: Ability to demonstrate management of disease process will improve 10/05/2023 1800 by Fabian Harlene SAILOR, RN Outcome: Progressing 10/05/2023 1800 by Fabian Harlene SAILOR, RN Outcome: Progressing Goal: Ability to verbalize understanding of medication therapies will improve 10/05/2023 1800 by Fabian Harlene SAILOR, RN Outcome: Progressing 10/05/2023 1800 by Fabian Harlene SAILOR, RN Outcome: Progressing Goal: Individualized Educational Video(s) 10/05/2023 1800 by Fabian Harlene SAILOR, RN Outcome: Progressing 10/05/2023 1800 by Fabian Harlene SAILOR, RN Outcome: Progressing   Problem: Activity: Goal: Capacity to carry out activities will improve 10/05/2023 1800 by Fabian Harlene SAILOR, RN Outcome: Progressing 10/05/2023 1800 by Fabian Harlene SAILOR, RN Outcome: Progressing   Problem: Cardiac: Goal: Ability to achieve and maintain adequate cardiopulmonary perfusion will improve 10/05/2023 1800 by Fabian Harlene SAILOR, RN Outcome: Progressing 10/05/2023 1800 by Fabian Harlene SAILOR, RN Outcome: Progressing   Problem: Education: Goal: Knowledge of General Education information will improve Description: Including pain rating scale, medication(s)/side effects and non-pharmacologic comfort measures 10/05/2023 1800 by Fabian Harlene SAILOR, RN Outcome: Progressing 10/05/2023 1800 by Fabian Harlene SAILOR, RN Outcome: Progressing   Problem: Health Behavior/Discharge Planning: Goal: Ability to manage health-related needs will improve 10/05/2023 1800 by Fabian Harlene SAILOR, RN Outcome: Progressing 10/05/2023 1800 by Fabian Harlene SAILOR, RN Outcome: Progressing   Problem: Clinical Measurements: Goal: Ability to maintain clinical measurements within normal limits will improve 10/05/2023 1800 by Fabian Harlene SAILOR, RN Outcome: Progressing 10/05/2023 1800 by Fabian Harlene SAILOR, RN Outcome: Progressing Goal: Will remain free from infection 10/05/2023 1800 by Fabian Harlene SAILOR, RN Outcome:  Progressing 10/05/2023 1800 by Fabian Harlene SAILOR, RN Outcome: Progressing Goal: Diagnostic test results will improve 10/05/2023 1800 by Fabian Harlene SAILOR, RN Outcome: Progressing 10/05/2023 1800 by Fabian Harlene SAILOR, RN Outcome: Progressing Goal: Respiratory complications will improve 10/05/2023 1800 by Fabian Harlene SAILOR, RN Outcome: Progressing 10/05/2023 1800 by Fabian Harlene SAILOR, RN Outcome: Progressing Goal: Cardiovascular complication will be avoided 10/05/2023 1800 by Fabian Harlene SAILOR, RN Outcome: Progressing 10/05/2023 1800 by Fabian Harlene SAILOR, RN Outcome: Progressing   Problem: Activity: Goal: Risk for activity intolerance will decrease 10/05/2023 1800 by Fabian Harlene SAILOR, RN Outcome: Progressing 10/05/2023 1800 by Fabian Harlene SAILOR, RN Outcome: Progressing   Problem: Nutrition: Goal: Adequate nutrition will be maintained 10/05/2023 1800 by Fabian Harlene SAILOR, RN Outcome: Progressing 10/05/2023 1800 by Fabian Harlene SAILOR, RN Outcome: Progressing   Problem: Coping: Goal: Level of anxiety will decrease 10/05/2023 1800 by Fabian Harlene SAILOR, RN Outcome: Progressing 10/05/2023 1800 by Fabian Harlene SAILOR, RN Outcome: Progressing   Problem: Elimination: Goal: Will not experience complications related to bowel motility 10/05/2023 1800 by Fabian Harlene SAILOR, RN Outcome: Progressing 10/05/2023 1800 by Fabian Harlene SAILOR, RN Outcome: Progressing Goal: Will not experience complications related to urinary retention 10/05/2023 1800 by Fabian Harlene SAILOR, RN Outcome: Progressing 10/05/2023 1800 by Fabian Harlene SAILOR, RN Outcome: Progressing   Problem: Pain Managment: Goal: General experience of comfort will improve and/or be controlled 10/05/2023 1800 by Fabian Harlene SAILOR, RN Outcome: Progressing 10/05/2023 1800 by Fabian Harlene SAILOR, RN Outcome: Progressing   Problem: Safety: Goal: Ability to remain free from injury will improve 10/05/2023 1800 by Fabian Harlene SAILOR,  RN Outcome: Progressing 10/05/2023 1800 by Fabian Harlene SAILOR, RN Outcome: Progressing   Problem: Skin Integrity: Goal: Risk for impaired skin integrity will decrease 10/05/2023 1800 by Fabian Harlene SAILOR, RN Outcome: Progressing 10/05/2023 1800 by Fabian Harlene SAILOR, RN Outcome: Progressing

## 2023-10-05 NOTE — Progress Notes (Signed)
 Progress Note   Patient: Anthony Chambers FMW:[id number] DOB: Nov 05, 1945 DOA: 10/04/2023     0 DOS: the patient was seen and examined on 10/05/2023   Brief hospital course: 78 y.o. male with medical history significant of hyperlipidemia, GERD, PAD, popliteal artery aneurysm s/p repair, chronic diastolic CHF, COPD, OSA on BiPAP, chronic respiratory failure with hypoxia, DVT presenting with altered mental status.   History obtained with assistance of chart review.   Patient was noted to be altered this morning.  EMS was called.  Patient typically uses BiPAP at night and 4 L during the day due to his chronic respiratory failure and OSA.  Did not have BiPAP overnight for unclear reason and unclear if he has been without it for any longer than that.   Patient more alert now in the ED after BiPAP.  Still appears somewhat altered and chart reveals history of schizophrenia but is currently medicated.  Unclear baseline.  Does indicate that he had some issues with his BiPAP previously but unclear how long.   Denies specific complaints but not able to fully participate in review of systems.   ED Course: Vital signs in ED notable for heart rate in the 130s, respiratory rate in the 20s, blood pressure in the 110s-130 systolic.  Requiring BiPAP initially.   Lab workup included CMP with chloride 95, bicarb 38, glucose 104, calcium 8.4.  CBC with hemoglobin stable at 12.4.  Troponin 18, repeat pending.  BNP elevated to 608.  Respiratory panel negative for flu COVID and RSV.  Magnesium normal.  Urinalysis pending.  ABG with normal pH with pCO2 elevated to 67.   Chest x-ray showed low lung volume, bilateral interstitial prominence, questionable right pleural effusion, no evidence of pulmonary edema.  CT head showed no acute abnormality.   Patient received 5 mg IV metoprolol , 25 mg p.o. metoprolol , 60 mg IV Lasix , BiPAP in the ED.  9/27.  Patient inability to answer some simple questions, straight leg raise and  lift up his arms.  Nursing staff stated that he refused to wear the BiPAP.  Assessment and Plan: * Acute on chronic diastolic CHF (congestive heart failure) (HCC) Will decrease IV Lasix  to daily dosing  Acute metabolic encephalopathy Unable to reach family for baseline mental status.  Reaching out to hospice liaison.  Acute on chronic respiratory failure with hypoxia and hypercapnia (HCC) As per nursing staff patient did not want to wear the BiPAP.  Will try BiPAP at night since he wears it at home.  Chronically wears 4 L of oxygen.  This morning on 6 L.  Hypothermia Needed warming blanket.  Send off blood cultures.  White blood cell count normal range.  Chest x-ray negative.  Hold off on antibiotics for right now.  Send another urine analysis and stool studies.  Atrial fibrillation with RVR (HCC) Patient on Toprol  and amiodarone  drip.  On Xarelto   Diarrhea Stool studies if further diarrhea  COPD (chronic obstructive pulmonary disease) (HCC) Does not seem to be in exacerbation at this time.  DVT (deep venous thrombosis) (HCC) On Xarelto   Depression On Zoloft   PVD (peripheral vascular disease) On Xarelto         Subjective: Patient feels okay.  Does not know why he came to the hospital.  Sent in for altered mental status  Physical Exam: Vitals:   10/05/23 0900 10/05/23 1000 10/05/23 1100 10/05/23 1200  BP: (!) 147/80 122/81 99/71 (!) 103/57  Pulse: 60 (!) 106 (!) 33 85  Resp:  19 (!)  33 15  Temp:  (!) 95 F (35 C)  (!) 97.5 F (36.4 C)  TempSrc:  Rectal  Axillary  SpO2: 97% 98% 93% 96%  Weight:      Height:       Physical Exam HENT:     Head: Normocephalic.  Eyes:     General: Lids are normal.     Conjunctiva/sclera: Conjunctivae normal.  Cardiovascular:     Rate and Rhythm: Normal rate and regular rhythm.     Heart sounds: Normal heart sounds, S1 normal and S2 normal.  Pulmonary:     Breath sounds: No decreased breath sounds, wheezing, rhonchi or rales.   Abdominal:     Palpations: Abdomen is soft.     Tenderness: There is no abdominal tenderness.  Musculoskeletal:     Right lower leg: No swelling.     Left lower leg: No swelling.  Skin:    General: Skin is warm.     Comments: Skin tear left leg, chronic lower extremity discoloration  Neurological:     Mental Status: He is alert.     Comments: Able to straight leg raise     Data Reviewed: White blood count 4.6, hemoglobin 10.7, platelet count 275, creatinine 0.99, sodium 139, potassium 3.8, chloride 91, CO2 36,  Family Communication: Tried to reach sister twice  Disposition: Status is: Observation Patient had a mews score of 5 earlier.  Not stable for disposition.  Continue amiodarone  drip for now  Planned Discharge Destination: Skilled nursing facility    Time spent: 28 minutes  Author: Charlie Patterson, MD 10/05/2023 12:21 PM  For on call review [email or web address].

## 2023-10-05 NOTE — Assessment & Plan Note (Signed)
 On Xarelto

## 2023-10-05 NOTE — Assessment & Plan Note (Signed)
 Needed warming blanket.  Send off blood cultures.  White blood cell count normal range.  Chest x-ray negative.  Hold off on antibiotics for right now.  Send another urine analysis and stool studies.

## 2023-10-05 NOTE — Assessment & Plan Note (Signed)
 Continue BiPAP for high PaCO2.  Was not on bubble high flow nasal cannula when off BiPAP.  Chronically wears 4 L.

## 2023-10-05 NOTE — Hospital Course (Signed)
 78 y.o. male with medical history significant of hyperlipidemia, GERD, PAD, popliteal artery aneurysm s/p repair, chronic diastolic CHF, COPD, OSA on BiPAP, chronic respiratory failure with hypoxia, DVT presenting with altered mental status.   History obtained with assistance of chart review.   Patient was noted to be altered this morning.  EMS was called.  Patient typically uses BiPAP at night and 4 L during the day due to his chronic respiratory failure and OSA.  Did not have BiPAP overnight for unclear reason and unclear if he has been without it for any longer than that.   Patient more alert now in the ED after BiPAP.  Still appears somewhat altered and chart reveals history of schizophrenia but is currently medicated.  Unclear baseline.  Does indicate that he had some issues with his BiPAP previously but unclear how long.   Denies specific complaints but not able to fully participate in review of systems.   ED Course: Vital signs in ED notable for heart rate in the 130s, respiratory rate in the 20s, blood pressure in the 110s-130 systolic.  Requiring BiPAP initially.   Lab workup included CMP with chloride 95, bicarb 38, glucose 104, calcium 8.4.  CBC with hemoglobin stable at 12.4.  Troponin 18, repeat pending.  BNP elevated to 608.  Respiratory panel negative for flu COVID and RSV.  Magnesium normal.  Urinalysis pending.  ABG with normal pH with pCO2 elevated to 67.   Chest x-ray showed low lung volume, bilateral interstitial prominence, questionable right pleural effusion, no evidence of pulmonary edema.  CT head showed no acute abnormality.   Patient received 5 mg IV metoprolol , 25 mg p.o. metoprolol , 60 mg IV Lasix , BiPAP in the ED.  9/27.  Patient inability to answer some simple questions, straight leg raise and lift up his arms.  Nursing staff stated that he refused to wear the BiPAP.

## 2023-10-05 NOTE — Plan of Care (Signed)
  Problem: Activity: Goal: Capacity to carry out activities will improve Outcome: Progressing   Problem: Cardiac: Goal: Ability to achieve and maintain adequate cardiopulmonary perfusion will improve Outcome: Progressing   Problem: Clinical Measurements: Goal: Respiratory complications will improve Outcome: Not Progressing

## 2023-10-05 NOTE — Consult Note (Cosign Needed Addendum)
 PULMONARY / CRITICAL CARE MEDICINE  Name: Anthony Chambers MRN: [id number] DOB: 07-21-1945    LOS: 0  Referring Provider:  Dr. Josette Reason for Referral:  Shock and worsening mental status Brief patient description: This is a 78 year old Caucasian male, resident of a skilled nursing facility who was admitted with altered mental status and acute hypoxic respiratory failure secondary to volume overload.  He was found to be hypercarbic and placed on BiPAP.  He was also started on amiodarone  for A-fib with RVR and aggressively diuresed.  This evening patient became more lethargic, hypotensive, hypothermic and requiring more oxygen on BiPAP.  PCCM was consulted for further management.  HPI: This is a 78 year old Caucasian male with a medical history as indicated below who presented to the ED on 10/04/2023 from a skilled nursing facility with complaints of altered mental status.  History is obtained from ED records as patient is currently somnolent on BiPAP and unable to provide a history.  When EMS arrived, patient was placed on BiPAP as they felt his changes in mental status might be due to hypercarbia.  SNF staff indicated that patient is supposed to use BiPAP at at bedtime however patient has not been using it as ordered.  Upon arrival in the ED, patient was more awake.  Per nursing home, his ED workup revealed a pCO2 of 67, heart rate in the 130s, proBNP level of 608, A-fib with RVR on EKG and bilateral interstitial prominence on chest x-ray suggestive of pulmonary edema as well as a right pleural effusion.  Patient was admitted to the hospitalist service and started on amiodarone  for rate control, IV furosemide  and maintained on BiPAP.  Overnight, patient was combative and did not sleep. This morning, patient was awake and took his medications.  However as the day progressed, he became more and more somnolent.  This afternoon, he became hypotensive with a blood pressure of 80/53 with a MAP of 62, and  temperature of 36.4 C.  He was placed on BiPAP by the primary team and PCCM was consulted for further management.  Past Medical History:  Diagnosis Date   Acute on chronic respiratory failure with hypoxia and hypercapnia (HCC) 08/08/2020   Acute respiratory failure with hypoxia and hypercapnia (HCC) 06/01/2018   Depression    GERD (gastroesophageal reflux disease)    Hyperlipidemia    Hypertension    Schizophrenia (HCC)    Total occlusion of coronary artery, chronic    History reviewed. No pertinent surgical history. No current facility-administered medications on file prior to encounter.   Current Outpatient Medications on File Prior to Encounter  Medication Sig   acetaminophen  (TYLENOL ) 500 MG tablet Take 1,000 mg by mouth 2 (two) times daily.   cetirizine (ZYRTEC) 10 MG tablet Take 10 mg by mouth daily.   divalproex  (DEPAKOTE  ER) 250 MG 24 hr tablet Take 250 mg by mouth daily.   ergocalciferol (VITAMIN D2) 1.25 MG (50000 UT) capsule Take 50,000 Units by mouth once a week. Every Friday   furosemide  (LASIX ) 40 MG tablet Take 40 mg by mouth daily.   INVEGA SUSTENNA injection Inject 0.5 mLs into the muscle every 30 (thirty) days. On the third Monday of each month   losartan (COZAAR) 50 MG tablet Take 50 mg by mouth daily.   metoprolol  succinate (TOPROL -XL) 25 MG 24 hr tablet Take 1 tablet (25 mg total) by mouth daily. Take with or immediately following a meal.   MIRALAX 17 g packet Take 17 g by mouth daily.  OLANZapine  zydis (ZYPREXA ) 10 MG disintegrating tablet Take 30 mg by mouth daily. Takes a 20mg  with a 10mg  tablet at bedtime   omeprazole (PRILOSEC) 20 MG capsule Take 20 mg by mouth daily.   rivaroxaban  (XARELTO ) 20 MG TABS tablet Take 20 mg by mouth daily.   sennosides-docusate sodium  (SENOKOT-S) 8.6-50 MG tablet Take 1 tablet by mouth in the morning and at bedtime.   sertraline  (ZOLOFT ) 25 MG tablet Take 25 mg by mouth daily.   tamsulosin  (FLOMAX ) 0.4 MG CAPS capsule Take 1  capsule (0.4 mg total) by mouth daily after supper.   Tiotropium Bromide  Monohydrate 2.5 MCG/ACT AERS Inhale 2 puffs into the lungs daily.   traMADol HCl 25 MG TABS Take 25 mg by mouth 3 (three) times daily as needed (pain).   albuterol  (VENTOLIN  HFA) 108 (90 Base) MCG/ACT inhaler Inhale 2 puffs into the lungs every 4 (four) hours as needed for wheezing or shortness of breath. (Patient not taking: Reported on 10/04/2023)   OXYGEN Inhale 4 L/min into the lungs continuous.    Allergies Allergies  Allergen Reactions   Heparin     Family History Family History  Problem Relation Age of Onset   Asthma Mother    Social History  reports that he has quit smoking. His smoking use included cigarettes. He has never used smokeless tobacco. He reports that he does not currently use alcohol. He reports that he does not use drugs.  Review Of Systems: Unable to obtain as patient is currently on BiPAP and somnolent  VITAL SIGNS: BP 112/79   Pulse 83   Temp (!) 97.5 F (36.4 C) (Axillary)   Resp 18   Ht 6' 3 (1.905 m)   Wt 100.3 kg   SpO2 (!) 87%   BMI 27.64 kg/m   HEMODYNAMICS:    VENTILATOR SETTINGS: FiO2 (%):  [30 %-50 %] 50 %  INTAKE / OUTPUT: I/O last 3 completed shifts: In: 772.4 [P.O.:350; I.V.:422.4] Out: 2000 [Urine:2000]  PHYSICAL EXAMINATION: General: Acutely ill looking, older for age, in moderate respiratory distress HEENT: Head is normocephalic and atraumatic, PERRLA, trachea midline, BiPAP mask in place, no JVD Neuro: Combative upon arousal, moves all extremities Cardiovascular: Apical pulse is regular, S1-S2, no murmur regurg or gallop Lungs: Bilateral breath sounds diminished in all lung fields, no wheezing or rhonchi Abdomen: Central obesity, positive bowel sounds in all 4 quadrants, palpation reveals no organomegaly Musculoskeletal: No joint deformities in upper and lower extremities Skin: Multiple ecchymotic areas in upper and lower extremities, left lower  extremity with localized erythema and venous stasis discoloration, multiple small open areas in bilateral lower extremities at different stages of healing, no weeping, no warmth  LABS:  BMET Recent Labs  Lab 10/04/23 1005 10/05/23 0445  NA 145 139  K 4.5 3.8  CL 95* 91*  CO2 38* 36*  BUN 22 26*  CREATININE 1.09 0.99  GLUCOSE 104* 236*    Electrolytes Recent Labs  Lab 10/04/23 1005 10/05/23 0445  CALCIUM 8.4* 7.5*  MG 2.2  --     CBC Recent Labs  Lab 10/04/23 1005 10/05/23 0445 10/05/23 1553  WBC 4.4 4.6 4.1  HGB 12.4* 10.7* 10.6*  HCT 41.6 34.3* 34.1*  PLT 157 275 133*    Coag's No results for input(s): APTT, INR in the last 168 hours.  Sepsis Markers No results for input(s): LATICACIDVEN, PROCALCITON, O2SATVEN in the last 168 hours.  ABG Recent Labs  Lab 10/04/23 0958 10/05/23 1519  PHART 7.4 7.42  PCO2ART 67* 77*  PO2ART 144* 75*    Liver Enzymes Recent Labs  Lab 10/04/23 1005 10/05/23 0445  AST 14* 13*  ALT 14 10  ALKPHOS 77 60  BILITOT 0.7 0.5  ALBUMIN 3.5 2.7*    Cardiac Enzymes No results for input(s): TROPONINI, PROBNP in the last 168 hours.  Glucose Recent Labs  Lab 10/04/23 1545  GLUCAP 88    Imaging No results found.  STUDIES:  2D echo done, read pending  CULTURES: None  ANTIBIOTICS: None  SIGNIFICANT EVENTS: 10/04/2023: Admitted 10/05/2023: Hypotensive, PCCM assumed care  LINES/TUBES: Peripheral IVs  DISCUSSION: 78 year old male with a baseline history of chronic respiratory failure on nocturnal BiPAP due to chronic diastolic heart failure and COPD; now presenting with worsening mental status, hypotension and hypothermia.  Differential diagnosis includes septic shock, worsening acute on chronic respiratory failure with hypoxemia and hypercarbia.  PCCM will assume patient's care.  ASSESSMENT / PLAN:  PULMONARY A: Acute on chronic respiratory failure with hypoxia and hypercarbia Pulmonary  edema History of COPD P:   -Continuous BiPAP as needed and mandatory nocturnal BiPAP -ABG and chest x-ray as needed - Nebulized bronchodilators - Monitor respiratory status closely - Comprehensive respiratory viral panel  CARDIOVASCULAR A:  Acute on chronic diastolic heart failure with exacerbation-repeat echo pending.  Echo from 08/08/2020 showed an EF of 55 to 60%, with grade 1 diastolic dysfunction.  Baseline BNP is 608 History of hypertension A-fib with RVR-Heart rate is currently well-controlled on amiodarone . P:  -Hold diuresis and antihypertensive medications in light of hypotension - Peripheral Levophed and titrate to maintain mean arterial blood pressure 65 and above - Follow-up echo results - Continue amiodarone  infusion and titrate off as needed.  Will consider transition to oral Amio if A-fib recurs after discontinuation of IV amiodarone  - Trend cardiac enzymes - Monitor and correct electrolytes -Trend BNP level -Continue Xarelto  20 mg daily  Gastrointestinal A:   GERD P:  -Continue pantoprazole  40 mg daily - Monitor for GI symptoms  Renal/urology A:   BPH  P: -Continue Flomax  - Monitor I's and O's   HEMATOLOGIC A:   Anemia of chronic disease P:  -Baseline hemoglobin 12.4 - Trend hemoglobin and hematocrit and transfuse as indicated  INFECTIOUS A:   ? Pneumonia-Will rule out sepsis P:   -Lactic acid STAT x 1 now and trend if elevated - Monitor temperature curve and trend WBC  ENDOCRINE A:   Mild hyperglycemia without type 2 diabetes P:   -Point-of-care testing every 6 hours  NEUROLOGIC A:   Acute encephalopathy secondary to hypercarbia; I doubt that patient's mentation is due to sepsis.  Baseline lactic acid is 0.9, no leukocytosis and no fever even though patient is mildly hypothermic.  ABG shows a pCO2 level of 77. P:   -Continuous BiPAP as needed and at bedtime - ABG and chest x-ray as needed - Patient will need to be set up for new BiPAP  machine upon discharge because it appears what ever BiPAP machine he has at the nursing home is not working appropriately.  Will consult case management. - Continue to monitor mental status closely -Tylenol  for pain -Consider low dose precedex for severe agiation  Psychiatric A: History of Schizophrenia P: -Continue current medications-Zyprexa , Zoloft , Depakote  - Consider psych evaluation for worsening agitation  Best Practice: Diet/type: Regular heart healthy diet VTE prophylaxis:  SCD's /already on Xarelto  GI prophylaxis: Pantoprazole  Lines: PIV's Foley: None Code Status: Full code Last date of multidisciplinary goals of care discussion: 10/06/2023  Palliative care consulted for goals of care discussion  Critical care time spent examining patient, establishing treatment plan, managing vent, reviewing history and labs, CXR, and ABG interpretation is 50 minutes   Dewain Platz S. Griffin, PhD, MSN, MPH, ANP-BC [facility] Prefer epic messenger for cross cover needs If after hours, please call E-link OR ICU at [phone]  NB: This document was prepared using Dragon voice recognition software and may include unintentional dictation errors.     10/05/2023, 4:14 PM

## 2023-10-05 NOTE — Plan of Care (Signed)
 Palliative consult received.  Per review of chart, Anthony Chambers active with [facility] hospice services at [facility]. Many members of care team have been in communication with Anthony Chambers sister today. Plan to attempt collaborative meeting with family with [facility] hospice liaison, Saddie Na, RN later today or tomorrow. Full consult note to follow.  No Charge.  Waddell Lesches, DNP, AGNP-C Palliative Medicine  Please call Palliative Medicine team phone with any questions [phone]. For individual providers please see AMION.

## 2023-10-05 NOTE — Progress Notes (Signed)
 Patient has removed bipap multiple times. Unable to educate patient about compliance. Applied nasal cannula.

## 2023-10-05 NOTE — Progress Notes (Signed)
  Echocardiogram 2D Echocardiogram has been performed. Limited Echo was performed due to both no acoustic windows and combative patient.  Anthony Chambers Louder 10/05/2023, 9:45 AM

## 2023-10-05 NOTE — Progress Notes (Signed)
 Patient now on 50% BiPAP.  Patient less responsive.  Will get an ABG and stat labs.  Blood pressure on the lower side.  Case discussed with critical care team.  Patient is a full code even though followed by hospice.  Patient did not sleep last night so could be part of the issue that he is sleeping now. Physical Exam Cardiovascular:     Rate and Rhythm: Normal rate. Rhythm irregularly irregular.     Heart sounds: Normal heart sounds, S1 normal and S2 normal.  Pulmonary:     Breath sounds: Examination of the right-lower field reveals decreased breath sounds. Examination of the left-lower field reveals decreased breath sounds. Decreased breath sounds present. No wheezing, rhonchi or rales.  Neurological:     Mental Status: He is unresponsive.     Comments: Patient given sternal rub   Came back and spoke with sister at the bedside, case discussed with nursing staff.  Prolonged care 20 minutes

## 2023-10-05 NOTE — Assessment & Plan Note (Signed)
 Will decrease IV Lasix  to daily dosing

## 2023-10-05 NOTE — Consult Note (Signed)
 Consultation Note Date: 10/05/2023   Patient Name: Anthony Chambers  DOB: 06-24-1945  MRN: [id number]  Age / Sex: 78 y.o., male  PCP: Anthony Query, MD Referring Physician: Malka Domino, MD  Reason for Consultation: Establishing goals of care   HPI/Brief Hospital Course: 78 y.o. male  with past medical history of schizophrenia, CHF, PAD, popliteal artery aneurysm s/p repair, COPD, OSA on BiPAP and chronic respiratory failure with hypoxia admitted from [facility] on 10/04/2023 with altered mental status.  EMS called by facility staff due to Anthony Chambers being altered, reportedly Anthony Chambers did not wear his bipap the previous night and it remains unclear how long he has been without ABG revealed pCO2 67--placed on bipap, mentation somewhat improved Chest x-ray showed low lung volume, bilateral interstitial prominence, questionable right pleural effusion, no evidence of pulmonary edema. CT head showed no acute abnormality.   On assessment this morning by primary team Anthony Chambers found to be less responsive unable to answer simple questions, stat ABG obtained revealing CO2 increasingly elevated at 77-reportedly refused to wear BiPAP overnight 9/27 has been placed back on BiPAP, noted hypotension  Being treated for acute on chronic CHF exacerbation, acute on chronic respiratory failure with hypoxia and hypercapnia as well as acute metabolic encephalopathy  Palliative medicine was consulted for assisting with goals of care conversations.  Subjective:  Extensive chart review has been completed prior to meeting patient including labs, vital signs, imaging, progress notes, orders, and available advanced directive documents from current and previous encounters.  Visited with Anthony Chambers at his bedside. Nursing staff has recently removed bipap temporarily to allow Anthony Chambers time to eat dinner and take medications. Anthony Chambers is able to state his name and aware  he is in the hospital, he is unclear on reason for hospitalization or recent events. Attempted to discuss hospital course and recent events including CO2 retention and need for bipap. He is unable to participate in meaningful conversation at this time. Anthony Chambers does confirm, Anthony Chambers is his next of kin and would be the person he wishes to appoint as surrogate decision maker.  No family or visitors at bedside during time of visit.  Added to conference call by hospice liaison, Anthony Na, RN in speaking with Anthony Chambers sister Anthony Chambers.  Introduced myself as a Anthony Chambers as a member of the palliative care team. Explained palliative medicine is specialized medical care for people living with serious illness. It focuses on providing relief from the symptoms and stress of a serious illness. The goal is to improve quality of life for both the patient and the family.   Anthony Chambers shares she is Anthony Chambers only sibling.  Their parents are deceased.  Anthony Chambers has never been married and does not have children.  She lives shares Anthony Chambers lived with her and her husband for over 20 years prior to moving into [facility] long-term care facility about 10 years ago.  Anthony Chambers shares prior to moving into [facility] Anthony Chambers was primarily bedbound, his care needs increased which prompted his move into long-term care facility.  At baseline he requires assistance with most ADLs.  Anthony Chambers shares in the past Anthony Chambers has been very engaged and socially active within the facility.  She list shares within the last 6 months Anthony Chambers has declined significantly, has become less interactive and engaging.  Anthony Chambers shares she has not been able to visit Anthony Chambers within the last 3 months.  Since seeing him last Anthony Chambers  feels Anthony Chambers has lost a significant amount of weight.  We discussed patient's current illness and what it means in the larger context of patient's on-going co-morbidities. Natural disease trajectory and  expectations at EOL were discussed.   Anthony Chambers shares she has been updated by Anthony Chambers primary attending on Anthony Chambers's current condition.  Anthony Chambers shares when she was at bedside visiting earlier Anthony Chambers did not respond to her.  Anthony Chambers shares her concerns for Anthony Chambers's overall condition.  We discussed hospital course and most recent events.  Discussed further elevation in CO2 level likely contributing to altered mentation and minimal responsiveness.  Discussed cause of CO2 elevation likely related to refusal of BiPAP and unclear timeframe he went without BiPAP at facility.  Discussed purpose of BiPAP to assist with blowing off CO2 back to a normal level in hopes mentation will improve.  Attempted to elicit goals of care.  Anthony Chambers shares she is not aware that Anthony Chambers has completed advanced directive documents in the past.  She is aware she is next of kin and would serve as Runner, broadcasting/film/video.  We discussed CODE STATUS and the difference between full code and DO NOT RESUSCITATE. Encouraged family to consider DNR/DNI status understanding evidenced based poor outcomes in similar hospitalized patients, as the cause of the arrest is likely associated with chronic/terminal disease rather than a reversible acute cardio-pulmonary event.  Anthony Chambers is clear and stating she feels DNR/DNI is most appropriate for Anthony Chambers at this time.  Order placed to reflect DNR/DNI.  Anthony Chambers would like to continue with current treatment plan.  Anthony Chambers shares it is difficult for her and her husband to visit the hospital as they do not have access to a vehicle.  She will remain available by phone and open to ongoing goals of care conversations.  I discussed importance of continued conversations with family/support persons and all members of their medical team regarding overall plan of care and treatment options ensuring decisions are in alignment with patients goals of care.  All questions/concerns addressed. Emotional  support provided to patient/family/support persons. PMT will continue to follow and support patient as needed.  Objective: Primary Diagnoses: Present on Admission:  COPD (chronic obstructive pulmonary disease) (HCC)  DVT (deep venous thrombosis) (HCC)  GERD (gastroesophageal reflux disease)  Hyperlipidemia  PVD (peripheral vascular disease)  Popliteal artery aneurysm  Acute on chronic respiratory failure with hypoxia and hypercapnia (HCC)  Acute respiratory failure with hypoxia and hypercapnia (HCC)   Physical Exam Constitutional:      General: He is not in acute distress.    Appearance: He is ill-appearing.  HENT:     Head: Normocephalic.  Pulmonary:     Effort: Pulmonary effort is normal. No respiratory distress.  Skin:    General: Skin is warm and dry.  Neurological:     Mental Status: He is alert. He is disoriented.     Motor: Weakness present.     Vital Signs: BP 104/62   Pulse 89   Temp 98.5 F (36.9 C) (Axillary)   Resp (!) 22   Ht 6' 3 (1.905 m)   Wt 100.3 kg   SpO2 93%   BMI 27.64 kg/m  Pain Scale: 0-10   Pain Score: Asleep  IO: Intake/output summary:  Intake/Output Summary (Last 24 hours) at 10/05/2023 1748 Last data filed at 10/05/2023 1300 Gross per 24 hour  Intake 606.27 ml  Output 2300 ml  Net -1693.73 ml    LBM: Last BM Date :  10/04/23 Baseline Weight: Weight: 99.8 kg Most recent weight: Weight: 100.3 kg      Assessment and Plan  SUMMARY OF RECOMMENDATIONS   DNR/DNI PMT will continue to follow for ongoing needs and support  Palliative Prophylaxis:   Bowel Regimen, Delirium Protocol and Frequent Pain Assessment  Discussed With: Nursing staff, CCM team and [facility] hospice liaison   Thank you for this consult and allowing Palliative Medicine to participate in the care of Jahfari C. Welchel. Palliative medicine will continue to follow and assist as needed.   Visit includes: Detailed review of medical records (labs, imaging, vital signs),  medically appropriate exam (mental status, respiratory, cardiac, skin), discussed with treatment team, counseling and educating patient, family and staff, documenting clinical information, medication management and coordination of care.   Signed by: Waddell Lesches, DNP, AGNP-C Palliative Medicine    Please contact Palliative Medicine Team phone at [phone] for questions and concerns.  For individual provider: See Tracey

## 2023-10-05 NOTE — Assessment & Plan Note (Signed)
 Patient on amiodarone  drip.  Xarelto  if able to take.

## 2023-10-05 NOTE — Assessment & Plan Note (Signed)
 Stool studies if further diarrhea

## 2023-10-06 ENCOUNTER — Inpatient Hospital Stay

## 2023-10-06 DIAGNOSIS — R918 Other nonspecific abnormal finding of lung field: Secondary | ICD-10-CM | POA: Diagnosis not present

## 2023-10-06 DIAGNOSIS — R197 Diarrhea, unspecified: Secondary | ICD-10-CM

## 2023-10-06 DIAGNOSIS — T68XXXS Hypothermia, sequela: Secondary | ICD-10-CM | POA: Diagnosis not present

## 2023-10-06 DIAGNOSIS — J439 Emphysema, unspecified: Secondary | ICD-10-CM

## 2023-10-06 DIAGNOSIS — I9589 Other hypotension: Secondary | ICD-10-CM

## 2023-10-06 DIAGNOSIS — G9341 Metabolic encephalopathy: Secondary | ICD-10-CM | POA: Diagnosis not present

## 2023-10-06 DIAGNOSIS — J9690 Respiratory failure, unspecified, unspecified whether with hypoxia or hypercapnia: Secondary | ICD-10-CM | POA: Diagnosis not present

## 2023-10-06 DIAGNOSIS — K219 Gastro-esophageal reflux disease without esophagitis: Secondary | ICD-10-CM

## 2023-10-06 DIAGNOSIS — I4891 Unspecified atrial fibrillation: Secondary | ICD-10-CM

## 2023-10-06 DIAGNOSIS — I5033 Acute on chronic diastolic (congestive) heart failure: Secondary | ICD-10-CM | POA: Diagnosis not present

## 2023-10-06 DIAGNOSIS — F2089 Other schizophrenia: Secondary | ICD-10-CM

## 2023-10-06 DIAGNOSIS — F3289 Other specified depressive episodes: Secondary | ICD-10-CM

## 2023-10-06 DIAGNOSIS — I959 Hypotension, unspecified: Secondary | ICD-10-CM

## 2023-10-06 DIAGNOSIS — Z515 Encounter for palliative care: Secondary | ICD-10-CM | POA: Diagnosis not present

## 2023-10-06 DIAGNOSIS — J9601 Acute respiratory failure with hypoxia: Secondary | ICD-10-CM | POA: Diagnosis not present

## 2023-10-06 DIAGNOSIS — Z8673 Personal history of transient ischemic attack (TIA), and cerebral infarction without residual deficits: Secondary | ICD-10-CM | POA: Diagnosis not present

## 2023-10-06 DIAGNOSIS — J90 Pleural effusion, not elsewhere classified: Secondary | ICD-10-CM | POA: Diagnosis not present

## 2023-10-06 LAB — BLOOD GAS, VENOUS
Acid-Base Excess: 18.8 mmol/L — ABNORMAL HIGH (ref 0.0–2.0)
Bicarbonate: 46.9 mmol/L — ABNORMAL HIGH (ref 20.0–28.0)
O2 Saturation: 91 %
Patient temperature: 37
pCO2, Ven: 69 mmHg — ABNORMAL HIGH (ref 44–60)
pH, Ven: 7.44 — ABNORMAL HIGH (ref 7.25–7.43)
pO2, Ven: 57 mmHg — ABNORMAL HIGH (ref 32–45)

## 2023-10-06 LAB — CBC
HCT: 32.7 % — ABNORMAL LOW (ref 39.0–52.0)
Hemoglobin: 10.9 g/dL — ABNORMAL LOW (ref 13.0–17.0)
MCH: 29.8 pg (ref 26.0–34.0)
MCHC: 33.3 g/dL (ref 30.0–36.0)
MCV: 89.3 fL (ref 80.0–100.0)
Platelets: 249 K/uL (ref 150–400)
RBC: 3.66 MIL/uL — ABNORMAL LOW (ref 4.22–5.81)
RDW: 14 % (ref 11.5–15.5)
WBC: 5.1 K/uL (ref 4.0–10.5)
nRBC: 0 % (ref 0.0–0.2)

## 2023-10-06 LAB — VALPROIC ACID LEVEL: Valproic Acid Lvl: <10 ug/mL — ABNORMAL LOW (ref 50–100)

## 2023-10-06 LAB — RESPIRATORY PANEL BY PCR

## 2023-10-06 LAB — BASIC METABOLIC PANEL WITH GFR
Anion gap: 12 (ref 5–15)
BUN: 25 mg/dL — ABNORMAL HIGH (ref 8–23)
CO2: 37 mmol/L — ABNORMAL HIGH (ref 22–32)
Calcium: 7.8 mg/dL — ABNORMAL LOW (ref 8.9–10.3)
Chloride: 92 mmol/L — ABNORMAL LOW (ref 98–111)
Creatinine, Ser: 1.04 mg/dL (ref 0.61–1.24)
GFR, Estimated: >60 mL/min (ref >60)
Glucose, Bld: 87 mg/dL (ref 70–99)
Potassium: 3.7 mmol/L (ref 3.5–5.1)
Sodium: 141 mmol/L (ref 135–145)

## 2023-10-06 LAB — GLUCOSE, CAPILLARY
Glucose-Capillary: 86 mg/dL (ref 70–99)
Glucose-Capillary: 78 mg/dL (ref 70–99)
Glucose-Capillary: 82 mg/dL (ref 70–99)
Glucose-Capillary: 91 mg/dL (ref 70–99)
Glucose-Capillary: 156 mg/dL — ABNORMAL HIGH (ref 70–99)
Glucose-Capillary: 90 mg/dL (ref 70–99)

## 2023-10-06 LAB — BLOOD GAS, ARTERIAL
Acid-Base Excess: 18.2 mmol/L — ABNORMAL HIGH (ref 0.0–2.0)
Bicarbonate: 46.7 mmol/L — ABNORMAL HIGH (ref 20.0–28.0)
O2 Content: 5 L/min
O2 Saturation: 99.9 %
Patient temperature: 37
pCO2 arterial: 72 mmHg (ref 32–48)
pH, Arterial: 7.42 (ref 7.35–7.45)
pO2, Arterial: 133 mmHg — ABNORMAL HIGH (ref 83–108)

## 2023-10-06 MED ORDER — SODIUM CHLORIDE 0.9 % IV SOLN
100.0000 mg | Freq: Two times a day (BID) | INTRAVENOUS | Status: DC
  Administered 2023-10-06 – 2023-10-07 (×3): 100 mg via INTRAVENOUS
  Filled 2023-10-06 (×3): qty 100

## 2023-10-06 MED ORDER — OLANZAPINE 10 MG PO TABS
10.0000 mg | ORAL_TABLET | Freq: Every day | ORAL | Status: DC
Start: 2023-10-06 — End: 2023-10-07
  Administered 2023-10-06: 10 mg via ORAL
  Filled 2023-10-06: qty 1

## 2023-10-06 MED ORDER — SODIUM CHLORIDE 0.9 % IV SOLN
2.0000 g | INTRAVENOUS | Status: DC
  Administered 2023-10-06 – 2023-10-07 (×2): 2 g via INTRAVENOUS
  Filled 2023-10-06 (×2): qty 20

## 2023-10-06 NOTE — Assessment & Plan Note (Signed)
 Continue Depakote  if able to take since the level is normal.  Continue lower dose Zyprexa  at night.

## 2023-10-06 NOTE — Progress Notes (Signed)
 Daily Progress Note   Patient Name: Anthony Chambers       Date: 10/06/2023 DOB: 1945-10-19  Age: 78 y.o. MRN#: [id number] Attending Physician: Josette Ade, MD Primary Care Physician: Eilleen Query, MD Admit Date: 10/04/2023  Reason for Consultation/Follow-up: Establishing goals of care  HPI/Brief Hospital Review: 78 y.o. male  with past medical history of schizophrenia, CHF, PAD, popliteal artery aneurysm s/p repair, COPD, OSA on BiPAP and chronic respiratory failure with hypoxia admitted from [facility] on 10/04/2023 with altered mental status.   EMS called by facility staff due to Mr. Budzynski being altered, reportedly Mr. Mccartney did not wear his bipap the previous night and it remains unclear how long he has been without ABG revealed pCO2 67--placed on bipap, mentation somewhat improved Chest x-ray showed low lung volume, bilateral interstitial prominence, questionable right pleural effusion, no evidence of pulmonary edema. CT head showed no acute abnormality.    On assessment this morning by primary team Mr. Sharp found to be less responsive unable to answer simple questions, stat ABG obtained revealing CO2 increasingly elevated at 77-reportedly refused to wear BiPAP overnight 9/27 has been placed back on BiPAP, noted hypotension   Being treated for acute on chronic CHF exacerbation, acute on chronic respiratory failure with hypoxia and hypercapnia as well as acute metabolic encephalopathy   Palliative medicine was consulted for assisting with goals of care conversations.  Subjective: Extensive chart review has been completed prior to meeting patient including labs, vital signs, imaging, progress notes, orders, and available advanced directive documents from current and previous  encounters.    Visited with Mr. Symmonds at his bedside. He is resting in bed, has been taken off bipap, nursing reports compliance with bipap overnight, now of HFNC. Mr. Steen attempts to open his eyes, unable to follow commands, easily drifts back to sleep without constant redirection. No family or visitors at bedside during time of visit.  Spoke with attending, MRI and Depakote  level ordered due to persistent metabolic encephalopathy. Depakote  level resulted at <10, MRI remains pending.  Spoke with [facility] hospice liaison, Saddie Na, RN who spoke with Mr. Deroche sister, Dorothe, provided updates. Awaiting results prior to making further goals of care decisions. Hospice liaison to continue to follow daily and communicate with Dorothe. Palliative medicine will continue to follow and support Mr. Seats,  will attempt further goals of care conversations with Dorothe or Mr. Demasi if mentation improves after allowing time for outcomes.  Answered and addressed all questions and concerns. PMT to continue to follow for ongoing needs and support.  Objective:  Physical Exam Constitutional:      General: He is not in acute distress.    Appearance: He is ill-appearing.  HENT:     Head: Normocephalic.  Pulmonary:     Effort: Pulmonary effort is normal. No respiratory distress.  Abdominal:     Palpations: Abdomen is soft.     Tenderness: There is no abdominal tenderness.  Skin:    General: Skin is warm and dry.  Neurological:     Mental Status: He is lethargic and disoriented.     Motor: Weakness present.             Vital Signs: BP 110/69   Pulse 65   Temp 98.2 F (36.8 C)   Resp (!) 22   Ht 6' 3 (1.905 m)   Wt 102.6 kg   SpO2 99%   BMI 28.27 kg/m  SpO2: SpO2: 99 % O2 Device: O2 Device: Bi-PAP O2 Flow Rate: O2 Flow Rate (L/min): 5 L/min   Palliative Care Assessment & Plan   Assessment/Recommendation/Plan  Allow time for outcomes Pending MRI [facility] Liaison follow--active  hospice patient  Care plan was discussed with primary attending, nursing staff and [facility] hospice liaison.  Thank you for allowing the Palliative Medicine Team to assist in the care of this patient.  Visit includes: Detailed review of medical records (labs, imaging, vital signs), medically appropriate exam (mental status, respiratory, cardiac, skin), discussed with treatment team, counseling and educating patient, family and staff, documenting clinical information, medication management and coordination of care.  Waddell Lesches, DNP, AGNP-C Palliative Medicine   Please contact Palliative Medicine Team phone at [phone] for questions and concerns.

## 2023-10-06 NOTE — Plan of Care (Signed)
  Problem: Education: Goal: Ability to demonstrate management of disease process will improve Outcome: Progressing Goal: Ability to verbalize understanding of medication therapies will improve Outcome: Progressing Goal: Individualized Educational Video(s) Outcome: Progressing   Problem: Activity: Goal: Capacity to carry out activities will improve Outcome: Progressing   Problem: Cardiac: Goal: Ability to achieve and maintain adequate cardiopulmonary perfusion will improve Outcome: Progressing   Problem: Education: Goal: Knowledge of General Education information will improve Description: Including pain rating scale, medication(s)/side effects and non-pharmacologic comfort measures Outcome: Progressing   Problem: Education: Goal: Ability to demonstrate management of disease process will improve Outcome: Progressing Goal: Ability to verbalize understanding of medication therapies will improve Outcome: Progressing Goal: Individualized Educational Video(s) Outcome: Progressing   Problem: Activity: Goal: Capacity to carry out activities will improve Outcome: Progressing   Problem: Cardiac: Goal: Ability to achieve and maintain adequate cardiopulmonary perfusion will improve Outcome: Progressing   Problem: Education: Goal: Knowledge of General Education information will improve Description: Including pain rating scale, medication(s)/side effects and non-pharmacologic comfort measures Outcome: Progressing

## 2023-10-06 NOTE — Progress Notes (Signed)
 Progress Note   Patient: Anthony Chambers FMW:[id number] DOB: Sep 19, 1945 DOA: 10/04/2023     1 DOS: the patient was seen and examined on 10/06/2023   Brief hospital course: 78 y.o. male with medical history significant of hyperlipidemia, GERD, PAD, popliteal artery aneurysm s/p repair, chronic diastolic CHF, COPD, OSA on BiPAP, chronic respiratory failure with hypoxia, DVT presenting with altered mental status.   History obtained with assistance of chart review.   Patient was noted to be altered this morning.  EMS was called.  Patient typically uses BiPAP at night and 4 L during the day due to his chronic respiratory failure and OSA.  Did not have BiPAP overnight for unclear reason and unclear if he has been without it for any longer than that.   Patient more alert now in the ED after BiPAP.  Still appears somewhat altered and chart reveals history of schizophrenia but is currently medicated.  Unclear baseline.  Does indicate that he had some issues with his BiPAP previously but unclear how long.   Denies specific complaints but not able to fully participate in review of systems.   ED Course: Vital signs in ED notable for heart rate in the 130s, respiratory rate in the 20s, blood pressure in the 110s-130 systolic.  Requiring BiPAP initially.   Lab workup included CMP with chloride 95, bicarb 38, glucose 104, calcium 8.4.  CBC with hemoglobin stable at 12.4.  Troponin 18, repeat pending.  BNP elevated to 608.  Respiratory panel negative for flu COVID and RSV.  Magnesium normal.  Urinalysis pending.  ABG with normal pH with pCO2 elevated to 67.   Chest x-ray showed low lung volume, bilateral interstitial prominence, questionable right pleural effusion, no evidence of pulmonary edema.  CT head showed no acute abnormality.   Patient received 5 mg IV metoprolol , 25 mg p.o. metoprolol , 60 mg IV Lasix , BiPAP in the ED.  9/27.  Patient inability to answer some simple questions, straight leg raise and  lift up his arms.  Nursing staff stated that he refused to wear the BiPAP.  In the ER patient became less responsive and required to go on BiPAP and Levophed. 9/28.  Mental status still impaired.  Depakote  level normal.  Will get MRI of the brain.  Continue BiPAP with elevated pCO2.  Patient unable to take oral medications this morning.   Assessment and Plan: * Acute metabolic encephalopathy Patient still with altered mental status today.  Will obtain an MRI of the brain.  Depakote  level normal.  White blood cell count normal.  Chest x-ray possibly showing infiltrates empirically put on Rocephin and doxycycline.  Acute on chronic diastolic CHF (congestive heart failure) (HCC) Hold IV Lasix  with altered mental status currently.  Hypotension Briefly required Levophed on 9/27.  Acute on chronic respiratory failure with hypoxia and hypercapnia (HCC) Continue BiPAP for high PaCO2.  Was not on bubble high flow nasal cannula when off BiPAP.  Chronically wears 4 L.  Hypothermia Needed warming blanket yesterday.  Follow-up blood cultures.  Empirically placed on antibiotics today.  Atrial fibrillation with RVR (HCC) Patient on amiodarone  drip.  Xarelto  if able to take.  Diarrhea Stool studies if further diarrhea  COPD (chronic obstructive pulmonary disease) (HCC) Does not seem to be in exacerbation at this time.  DVT (deep venous thrombosis) (HCC) On Xarelto   Depression Hold Zoloft   Schizophrenia (HCC) Continue Depakote  if able to take since the level is normal.  Continue lower dose Zyprexa  at night.  PVD (peripheral  vascular disease) On Xarelto         Subjective: Patient seen this morning early and was lethargic but did answer few questions.  Later in the morning he was less responsive.  Will get MRI of the brain.  Depakote  level low.  Initially admitted with altered mental status and rapid A-fib and CHF.  Physical Exam: Vitals:   10/06/23 0700 10/06/23 0714 10/06/23 0730  10/06/23 0900  BP: 95/63  108/82 109/71  Pulse: 86 (!) 104 69 96  Resp: (!) 0 16 (!) 24 17  Temp:      TempSrc:      SpO2: 95% 97% 97% 95%  Weight:      Height:       Physical Exam HENT:     Head: Normocephalic.  Eyes:     General: Lids are normal.     Conjunctiva/sclera: Conjunctivae normal.  Cardiovascular:     Rate and Rhythm: Normal rate. Rhythm irregularly irregular.     Heart sounds: Normal heart sounds, S1 normal and S2 normal.  Pulmonary:     Breath sounds: Examination of the right-lower field reveals decreased breath sounds. Examination of the left-lower field reveals decreased breath sounds. Decreased breath sounds present. No wheezing, rhonchi or rales.  Abdominal:     Palpations: Abdomen is soft.     Tenderness: There is no abdominal tenderness.  Musculoskeletal:     Right lower leg: Swelling present.     Left lower leg: Swelling present.  Skin:    General: Skin is warm.     Comments: Skin tear left leg, chronic lower extremity discoloration  Neurological:     Mental Status: He is lethargic.     Data Reviewed: Repeat chest x-ray showing increased patchy bibasilar opacity which may be reflect atelectasis versus developing infiltrate, increased interstitial markings concerning for mild edema Last ABG shows a pH of 7.42 pCO2 of 72, O2 of 133 Creatinine 1.04, sodium 141, potassium 3.7, white blood count 5.1, hemoglobin 10.9, platelet count 249, Depakote  level less than 10  Family Communication: Spoke with sister on the phone  Disposition: Status is: Inpatient Remains inpatient appropriate because: With patient's persistent altered mental status we will obtain an MRI of the brain.  Planned Discharge Destination: Long-term care versus hospice depending on clinical course    Time spent: 28 minutes  Author: Charlie Patterson, MD 10/06/2023 11:55 AM  For on call review [email or web address].

## 2023-10-06 NOTE — Progress Notes (Signed)
 [facility] Hospitalized Hospice Patient   Mr. Daxson Reffett is a current hospice patient followed at [facility] for terminal diagnosis of Chronic Diastolic Heart Failure.   Staff at patient's facility called 911 due to patient having altered mental status.  Patient is was admitted to [facility] 9.26.25 with primary diagnosis of Acute encephalopathy with Acute on chronic respiratory failure with hypoxia.  Patient also has a new onset of Atrial Fib with RVR.   Per Dr. Norleen Laurence, this is a hospice related hospitalization.    Mr. Hsiao remains in ICU.  After goals of care discussion last evening with patient's sister, she decided to make Mr. Bordelon a DNR.  I brought Waddell Lesches NP/PMT into the conversation via conference call so she could discuss further and witness/verify the conversation with the wife.  Patient has not had much improvement today.  Patient was going for MRI this evening.  I called and spoke with Dorothe today and she was aware there had not been much improvement- as she had a conversation earlier with the hospital.  She verbalized appreciation of the follow up and has hospital liaison number to follow up.  Mr. Baggerly remains appropriate for GIP level of care requiring ICU care to monitor effectiveness of medical interventions including amiodarone  gtt, BiPap and symptom management of shortness of breath.   Vital Signs: BP 95/63 P 86, R 16   Oxi 95 %    Abnormal labs: pCO2 arterial 72, Acid 18.2, Bicarb 46.7,    IV Meds & PRN Lasix  40mg  IV BID Amiodarone  gtt Rocephin 2g IV every day Doxycycline 100mg  BID   Diagnostics:   EXAM: MRI BRAIN WITHOUT CONTRAST 10/06/2023 02:46:36 PM   TECHNIQUE: Multiplanar multisequence MRI of the head/brain was performed without the administration of intravenous contrast. The study is severely degraded by patient motion.   COMPARISON: None available.   CLINICAL HISTORY: Mental status change, unknown cause. Encounter for ams.  Best obtainable images. Pt disoriented and unable to hold still.   FINDINGS:   BRAIN AND VENTRICLES: Diffusion-weighted images demonstrate no acute infarct. Confluent periventricular and subcortical white matter T2 hyperintensity is present bilaterally, left greater than right. 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 No intracranial hemorrhage. No mass. No midline shift. No hydrocephalus. The sella is unremarkable. Normal flow voids. Neuroquant measurements are likely impacted by the patient motion.   ORBITS: No acute abnormality.   SINUSES AND MASTOIDS: No acute abnormality.   BONES AND SOFT TISSUES: Normal marrow signal. No acute soft tissue abnormality.   IMPRESSION: 1. No acute infarct. 2. Confluent periventricular and subcortical white matter T2 hyperintensity bilaterally, left greater than right. 3. 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 4. Study severely degraded by patient motion.   Electronically signed by: Lonni Necessary MD 10/06/2023 03:34 PM EDT RP Workstation: [id number]   Hospital Problem:  per Dr. Charlie Patterson Progress note 9.28.25  Acute metabolic encephalopathy Patient still with altered mental status today.  Will obtain an MRI of the brain.  Depakote  level normal.  White blood cell count normal.  Chest x-ray possibly showing infiltrates empirically put on Rocephin and doxycycline.   Acute on chronic diastolic CHF (congestive heart failure) (HCC) Hold IV Lasix  with altered mental status currently.   Hypotension Briefly required Levophed on 9/27.   Acute on chronic respiratory failure with hypoxia and hypercapnia (HCC) Continue BiPAP for high PaCO2.  Was not on bubble high flow nasal cannula when off BiPAP.  Chronically wears 4  L.   Hypothermia Needed warming blanket yesterday.  Follow-up blood cultures.  Empirically placed on antibiotics today.   Atrial fibrillation with RVR (HCC) Patient on amiodarone  drip.   Xarelto  if able to take.   Diarrhea Stool studies if further diarrhea   COPD (chronic obstructive pulmonary disease) (HCC) Does not seem to be in exacerbation at this time.   DVT (deep venous thrombosis) (HCC) On Xarelto    Depression Hold Zoloft    Schizophrenia (HCC) Continue Depakote  if able to take since the level is normal.  Continue lower dose Zyprexa  at night.   PVD (peripheral vascular disease) On Xarelto    _________________________________________   Discharge Plan:  ongoing Goals of Care: DNR as of 9.27.25 IDT:  updated Family Contact:  Discussion with Dorothe via phone today to update on patient's status.  She is unable to come to the hospital because they are without a care.   Please do not hesitate to call with any hospice related questions or concerns.   Saddie HILARIO Na, [place] Nurse Liaison [phone]

## 2023-10-06 NOTE — Assessment & Plan Note (Signed)
 Briefly required Levophed on 9/27.

## 2023-10-06 NOTE — Plan of Care (Addendum)
 Patient is alert to self and place this shift. Denies pain. VSS this shift. Patient tolerated bipap well this shift. Respiratory Panel results were negative. Oral care offered with repositioning. High fall risk,  bed alarm on. Call light within reach.  Problem: Cardiac: Goal: Ability to achieve and maintain adequate cardiopulmonary perfusion will improve Outcome: Progressing   Problem: Clinical Measurements: Goal: Ability to maintain clinical measurements within normal limits will improve Outcome: Progressing Goal: Will remain free from infection Outcome: Progressing Goal: Diagnostic test results will improve Outcome: Progressing Goal: Respiratory complications will improve Outcome: Progressing Goal: Cardiovascular complication will be avoided Outcome: Progressing   Problem: Activity: Goal: Risk for activity intolerance will decrease Outcome: Progressing   Problem: Education: Goal: Knowledge of General Education information will improve Description: Including pain rating scale, medication(s)/side effects and non-pharmacologic comfort measures Outcome: Not Progressing

## 2023-10-06 NOTE — Progress Notes (Signed)
 PT Cancellation Note  Patient Details Name: Anthony Chambers MRN: [id number] DOB: [birth year]   Cancelled Treatment:    Reason Eval/Treat Not Completed: Fatigue/lethargy limiting ability to participate. PT orders received and pt chart reviewed. Pt asleep in bed. Unable to be aroused via sternal rub. RN present in room and placing pt back on BiPAP. Will continue efforts as appropriate.   Camie CHARLENA Kluver, PT, DPT 10:52 AM,10/06/23 Physical Therapist - [place] [facility]

## 2023-10-07 ENCOUNTER — Inpatient Hospital Stay

## 2023-10-07 DIAGNOSIS — J439 Emphysema, unspecified: Secondary | ICD-10-CM

## 2023-10-07 DIAGNOSIS — I959 Hypotension, unspecified: Secondary | ICD-10-CM

## 2023-10-07 DIAGNOSIS — Z515 Encounter for palliative care: Secondary | ICD-10-CM

## 2023-10-07 DIAGNOSIS — R197 Diarrhea, unspecified: Secondary | ICD-10-CM

## 2023-10-07 DIAGNOSIS — F209 Schizophrenia, unspecified: Secondary | ICD-10-CM

## 2023-10-07 DIAGNOSIS — I5033 Acute on chronic diastolic (congestive) heart failure: Secondary | ICD-10-CM | POA: Diagnosis not present

## 2023-10-07 DIAGNOSIS — G9341 Metabolic encephalopathy: Secondary | ICD-10-CM | POA: Diagnosis not present

## 2023-10-07 DIAGNOSIS — I4891 Unspecified atrial fibrillation: Secondary | ICD-10-CM

## 2023-10-07 DIAGNOSIS — R4182 Altered mental status, unspecified: Secondary | ICD-10-CM | POA: Diagnosis not present

## 2023-10-07 DIAGNOSIS — J9622 Acute and chronic respiratory failure with hypercapnia: Secondary | ICD-10-CM

## 2023-10-07 DIAGNOSIS — R569 Unspecified convulsions: Secondary | ICD-10-CM

## 2023-10-07 DIAGNOSIS — T68XXXS Hypothermia, sequela: Secondary | ICD-10-CM

## 2023-10-07 DIAGNOSIS — J9621 Acute and chronic respiratory failure with hypoxia: Secondary | ICD-10-CM | POA: Diagnosis not present

## 2023-10-07 DIAGNOSIS — F3289 Other specified depressive episodes: Secondary | ICD-10-CM

## 2023-10-07 LAB — BASIC METABOLIC PANEL WITH GFR
Anion gap: 11 (ref 5–15)
BUN: 20 mg/dL (ref 8–23)
CO2: 33 mmol/L — ABNORMAL HIGH (ref 22–32)
Calcium: 7.8 mg/dL — ABNORMAL LOW (ref 8.9–10.3)
Chloride: 97 mmol/L — ABNORMAL LOW (ref 98–111)
Creatinine, Ser: 1.09 mg/dL (ref 0.61–1.24)
GFR, Estimated: >60 mL/min (ref >60)
Glucose, Bld: 83 mg/dL (ref 70–99)
Potassium: 3.7 mmol/L (ref 3.5–5.1)
Sodium: 141 mmol/L (ref 135–145)

## 2023-10-07 LAB — GLUCOSE, CAPILLARY
Glucose-Capillary: 92 mg/dL (ref 70–99)
Glucose-Capillary: 96 mg/dL (ref 70–99)
Glucose-Capillary: 99 mg/dL (ref 70–99)

## 2023-10-07 LAB — CBC
HCT: 32.2 % — ABNORMAL LOW (ref 39.0–52.0)
Hemoglobin: 10.7 g/dL — ABNORMAL LOW (ref 13.0–17.0)
MCH: 29.1 pg (ref 26.0–34.0)
MCHC: 33.2 g/dL (ref 30.0–36.0)
MCV: 87.5 fL (ref 80.0–100.0)
Platelets: 145 K/uL — ABNORMAL LOW (ref 150–400)
RBC: 3.68 MIL/uL — ABNORMAL LOW (ref 4.22–5.81)
RDW: 14.2 % (ref 11.5–15.5)
WBC: 4.5 K/uL (ref 4.0–10.5)
nRBC: 0 % (ref 0.0–0.2)

## 2023-10-07 MED ORDER — FUROSEMIDE 10 MG/ML IJ SOLN
40.0000 mg | Freq: Once | INTRAMUSCULAR | Status: AC
  Administered 2023-10-07: 40 mg via INTRAVENOUS
  Filled 2023-10-07: qty 4

## 2023-10-07 MED ORDER — ONDANSETRON HCL 4 MG/2ML IJ SOLN: 4.0000 mg | Freq: Four times a day (QID) | INTRAMUSCULAR | Status: DC | PRN

## 2023-10-07 MED ORDER — DOXYCYCLINE HYCLATE 100 MG PO TABS
100.0000 mg | ORAL_TABLET | Freq: Two times a day (BID) | ORAL | Status: DC
  Administered 2023-10-08: 100 mg via ORAL
  Filled 2023-10-07: qty 1

## 2023-10-07 MED ORDER — BIOTENE DRY MOUTH MT LIQD: 15.0000 mL | OROMUCOSAL | Status: DC | PRN

## 2023-10-07 MED ORDER — OLANZAPINE 5 MG PO TABS
5.0000 mg | ORAL_TABLET | Freq: Every day | ORAL | Status: DC
  Administered 2023-10-07: 5 mg via ORAL
  Filled 2023-10-07 (×3): qty 1

## 2023-10-07 MED ORDER — AMIODARONE HCL 200 MG PO TABS
400.0000 mg | ORAL_TABLET | Freq: Two times a day (BID) | ORAL | Status: DC
Start: 2023-10-07 — End: 2023-10-09
  Administered 2023-10-07 – 2023-10-08 (×3): 400 mg via ORAL
  Filled 2023-10-07 (×3): qty 2

## 2023-10-07 MED ORDER — GLYCOPYRROLATE 1 MG PO TABS: 1.0000 mg | ORAL_TABLET | ORAL | Status: DC | PRN

## 2023-10-07 MED ORDER — HALOPERIDOL 0.5 MG PO TABS: 0.5000 mg | ORAL_TABLET | ORAL | Status: DC | PRN

## 2023-10-07 MED ORDER — HALOPERIDOL LACTATE 2 MG/ML PO CONC
0.5000 mg | ORAL | Status: DC | PRN
  Filled 2023-10-07: qty 5

## 2023-10-07 MED ORDER — RIVAROXABAN 20 MG PO TABS
20.0000 mg | ORAL_TABLET | Freq: Every day | ORAL | Status: DC
  Administered 2023-10-08: 20 mg via ORAL
  Filled 2023-10-07: qty 1

## 2023-10-07 MED ORDER — POLYVINYL ALCOHOL 1.4 % OP SOLN: 1.0000 [drp] | Freq: Four times a day (QID) | OPHTHALMIC | Status: DC | PRN

## 2023-10-07 MED ORDER — AMOXICILLIN-POT CLAVULANATE 875-125 MG PO TABS
1.0000 | ORAL_TABLET | Freq: Two times a day (BID) | ORAL | Status: DC
Start: 2023-10-08 — End: 2023-10-09
  Administered 2023-10-08: 1 via ORAL
  Filled 2023-10-07: qty 1

## 2023-10-07 MED ORDER — HALOPERIDOL LACTATE 5 MG/ML IJ SOLN: 0.5000 mg | INTRAMUSCULAR | Status: DC | PRN

## 2023-10-07 MED ORDER — GLYCOPYRROLATE 0.2 MG/ML IJ SOLN: 0.2000 mg | INTRAMUSCULAR | Status: DC | PRN

## 2023-10-07 MED ORDER — ONDANSETRON 4 MG PO TBDP: 4.0000 mg | ORAL_TABLET | Freq: Four times a day (QID) | ORAL | Status: DC | PRN

## 2023-10-07 MED ORDER — MORPHINE SULFATE (CONCENTRATE) 10 MG /0.5 ML PO SOLN: 5.0000 mg | ORAL | Status: DC | PRN

## 2023-10-07 NOTE — Evaluation (Addendum)
 Physical Therapy Evaluation Patient Details Name: Anthony Chambers MRN: [id number] DOB: 17-Jul-1945 Today's Date: 10/07/2023  History of Present Illness  Patient is a 78 year old male with  altered mental status and rapid A-fib and CHF. PMH: schizophrenia, CHF, PAD, popliteal artery aneurysm s/p repair, COPD, OSA on BiPAP and chronic respiratory failure with hypoxia.  Clinical Impression  PT evaluation completed. Patient needs multi modal sensory stimulation to maintain alertness for participation. He does have increased alertness with mobility. He is oriented to person and place. Min A required to sit up with fair sitting balance. Patient reports he feels to tired to attempt standing at this time. He reports he uses a wheelchair primarily at the facility. Vitals stable on 6 L02 with mobility. Recommend to continue PT to maximize independence and decrease caregiver burden. Consider rehabilitation < 3 hours/day after this hospital stay.       If plan is discharge home, recommend the following: A lot of help with walking and/or transfers;A lot of help with bathing/dressing/bathroom;Assistance with cooking/housework;Assist for transportation;Help with stairs or ramp for entrance;Supervision due to cognitive status   Can travel by private vehicle   No    Equipment Recommendations None recommended by PT  Recommendations for Other Services       Functional Status Assessment Patient has had a recent decline in their functional status and demonstrates the ability to make significant improvements in function in a reasonable and predictable amount of time.     Precautions / Restrictions Precautions Precautions: Fall Recall of Precautions/Restrictions: Impaired Restrictions Weight Bearing Restrictions Per Provider Order: No      Mobility  Bed Mobility Overal bed mobility: Needs Assistance Bed Mobility: Supine to Sit, Sit to Supine, Rolling Rolling: Modified independent (Device/Increase time)    Supine to sit: Min assist Sit to supine: Min assist   General bed mobility comments: cues for initiation and sequencing    Transfers                   General transfer comment: patient declined, reports he feels too tired. anticipate patient may need +2 person for standing initially    Ambulation/Gait                  Stairs            Wheelchair Mobility     Tilt Bed    Modified Rankin (Stroke Patients Only)       Balance Overall balance assessment: Needs assistance Sitting-balance support: Feet supported Sitting balance-Leahy Scale: Fair Sitting balance - Comments: no loss of balance while sitting                                     Pertinent Vitals/Pain Pain Assessment Pain Assessment: PAINAD Breathing: normal Negative Vocalization: none Facial Expression: smiling or inexpressive Body Language: relaxed Consolability: no need to console PAINAD Score: 0    Home Living Family/patient expects to be discharged to:: Skilled nursing facility                        Prior Function Prior Level of Function : Needs assist;Patient poor historian/Family not available             Mobility Comments: patient reports he is in a wheelchair primarily but can ambulate with a rolling walker with staff assistance ADLs Comments: unsure, patient is a poor historian  Extremity/Trunk Assessment   Upper Extremity Assessment Upper Extremity Assessment: Difficult to assess due to impaired cognition;Generalized weakness    Lower Extremity Assessment Lower Extremity Assessment: Generalized weakness;Difficult to assess due to impaired cognition       Communication   Communication Communication: Impaired Factors Affecting Communication: Difficulty expressing self (decreased level of arousal)    Cognition Arousal: Lethargic Behavior During Therapy: Flat affect   PT - Cognitive impairments: Difficult to assess Difficult  to assess due to: Level of arousal                     PT - Cognition Comments: decreased level of arousal. patient wakes up to sternal rub and is able to follow single step commands with increased time most of the time. multi modal cues and increased time required. increased alertness with sensory stimulation and increased activity. oriented to person, and [facility] hospital only. Following commands: Impaired Following commands impaired: Follows one step commands with increased time     Cueing Cueing Techniques: Verbal cues, Tactile cues, Visual cues     General Comments General comments (skin integrity, edema, etc.): vitals stable throughout session. nurse transitioned patient from Bi-pap to nasal canula with Sp02 in the mid 90's    Exercises     Assessment/Plan    PT Assessment Patient needs continued PT services  PT Problem List Decreased strength;Decreased range of motion;Decreased activity tolerance;Decreased balance;Decreased mobility;Decreased cognition;Decreased safety awareness       PT Treatment Interventions DME instruction;Gait training;Functional mobility training;Therapeutic activities;Therapeutic exercise;Balance training;Neuromuscular re-education;Cognitive remediation;Patient/family education;Wheelchair mobility training    PT Goals (Current goals can be found in the Care Plan section)  Acute Rehab PT Goals Patient Stated Goal: patient unable to participate with goal setting PT Goal Formulation: Patient unable to participate in goal setting Time For Goal Achievement: 10/21/23 Potential to Achieve Goals: Fair    Frequency Min 2X/week     Co-evaluation               AM-PAC PT 6 Clicks Mobility  Outcome Measure Help needed turning from your back to your side while in a flat bed without using bedrails?: None Help needed moving from lying on your back to sitting on the side of a flat bed without using bedrails?: A Little Help needed moving to and  from a bed to a chair (including a wheelchair)?: Total Help needed standing up from a chair using your arms (e.g., wheelchair or bedside chair)?: Total Help needed to walk in hospital room?: Total Help needed climbing 3-5 steps with a railing? : Total 6 Click Score: 11    End of Session Equipment Utilized During Treatment: Oxygen Activity Tolerance: Patient limited by lethargy Patient left: in bed;with call bell/phone within reach;with bed alarm set Nurse Communication: Mobility status PT Visit Diagnosis: Muscle weakness (generalized) (M62.81);Unsteadiness on feet (R26.81)    Time: 9068-9051 PT Time Calculation (min) (ACUTE ONLY): 17 min   Charges:   PT Evaluation $PT Eval High Complexity: 1 High   PT General Charges $$ ACUTE PT VISIT: 1 Visit         Randine Essex, PT, MPT   Randine LULLA Essex 10/07/2023, 12:29 PM

## 2023-10-07 NOTE — Progress Notes (Signed)
 Family agreeable to comfort care but patient now awake and talking.  On 8 L high flow nasal cannula.  Will give a dose of Lasix  to see if we can get respiratory status better.  Continue antibiotics but will change over to oral.  Change amiodarone  over to oral.  Will transfer out of ICU.  Patient is a DNR.  Transitional care team to look into whether patient has a BiPAP at the facility.  Physical Exam Cardiovascular:     Rate and Rhythm: Normal rate. Rhythm irregularly irregular.     Heart sounds: Normal heart sounds, S1 normal and S2 normal.  Pulmonary:     Breath sounds: Examination of the right-lower field reveals decreased breath sounds and rales. Examination of the left-lower field reveals decreased breath sounds and rales. Decreased breath sounds and rales present. No wheezing or rhonchi.  Neurological:     Mental Status: He is alert.      Reassess tomorrow 15 minutes, prolonged care Dr. Charlie Patterson

## 2023-10-07 NOTE — TOC Initial Note (Signed)
 Transition of Care [facility]) - Initial/Assessment Note    Patient Details  Name: Anthony Chambers MRN: [id number] Date of Birth: 09-22-1945  Transition [facility]) CM/SW Contact:    Corrie JINNY Ruts, LCSW Phone Number: 10/07/2023, 3:31 PM  Clinical Narrative:                 Chart reviewed. Patient was admitted for acute metabolic encephalopathy. The patient was sleep when attempting to consult the patient has a bipap when sleep. I consulted with the patient emergency contact, shelia Ector who is the patient sister.   I introduced myself, my role, and reason for consult.  I received a consult for the patient to receive a Bipap at D/C.   The patient sister reports that the patient as a PCP. Per chart the patient PCP is Joen breed. The patient sister confirmed that the patient is a resident of [facility].   The patient sister reports that the patient needed assistance before being admitted into the hospital. The patient sister reports that [facility] transport the patient to medical appointments. The patient sister report that the patient uses [facility] pharmacy. The patient sister reports that she is unsure who will assist the patient during D/C. The patient sister reports that the patient has had HH in the past. The patient reports that the patient have only been admitted into [facility]. The patient sister reports the patient will return to [facility] at discharge.   The patient sister reports that the patient is Wheel chair bound. The patient reports that she has no concerns.   Tammie from [facility] confirmed that the patient is Long term care and will be able to return. Tammie is going to contact SW about if the patient already has a bipap there.   There are NO other TOC needs at this time. TOC will follow the patient until discharge.     Barriers to Discharge: Continued Medical Work up   Patient Goals and CMS Choice            Expected Discharge Plan and Services        Living arrangements for the past 2 months: Skilled Nursing Facility                                      Prior Living Arrangements/Services Living arrangements for the past 2 months: Skilled Nursing Facility Lives with:: Facility Resident Patient language and need for interpreter reviewed:: Yes        Need for Family Participation in Patient Care: Yes (Comment) Care giver support system in place?: Yes (comment)   Criminal Activity/Legal Involvement Pertinent to Current Situation/Hospitalization: No - Comment as needed  Activities of Daily Living      Permission Sought/Granted   Permission granted to share information with : Yes, Release of Information Signed        Permission granted to share info w Relationship: Sister  Permission granted to share info w Contact Information: Dorothe Mace [phone]  Emotional Assessment Appearance:: Appears stated age Attitude/Demeanor/Rapport: Unable to Assess Affect (typically observed): Unable to Assess Orientation: : Oriented to Self, Oriented to Place, Oriented to  Time, Oriented to Situation Alcohol / Substance Use: Not Applicable Psych Involvement: No (comment)  Admission diagnosis:  Atrial fibrillation with RVR (HCC) [I48.91] Acute respiratory failure with hypoxia and hypercapnia (HCC) [J96.01, J96.02] Acute on chronic respiratory failure with hypoxia and hypercapnia (  HCC) [G03.78, J96.22] Patient Active Problem List   Diagnosis Date Noted   Hypotension 10/06/2023   Hypothermia 10/05/2023   Acute metabolic encephalopathy 10/05/2023   Diarrhea 10/05/2023   Depression 10/05/2023   Atrial fibrillation with RVR (HCC) 10/04/2023   Acute on chronic diastolic CHF (congestive heart failure) (HCC) 10/04/2023   Acute on chronic respiratory failure with hypoxia and hypercapnia (HCC) 10/04/2023   Schizophrenia (HCC) 10/04/2023   COPD (chronic obstructive pulmonary disease) (HCC) 12/16/2020   Rash and nonspecific skin  eruption 10/14/2020   Popliteal artery aneurysm 09/21/2020   Swelling of limb 09/21/2020   Hypokalemia 08/09/2020   DVT (deep venous thrombosis) (HCC) 10/20/2019   GERD (gastroesophageal reflux disease) 10/20/2019   Hyperlipidemia 10/20/2019   Difficulty walking 07/16/2016   Muscle weakness 07/16/2016   PVD (peripheral vascular disease) 08/15/2012   PCP:  Eilleen Query, MD Pharmacy:  No Pharmacies Listed    Social Drivers of Health (SDOH) Social History: SDOH Screenings   Food Insecurity: Patient Unable To Answer (10/04/2023)  Housing: Patient Unable To Answer (10/04/2023)  Transportation Needs: Patient Unable To Answer (10/04/2023)  Utilities: Patient Unable To Answer (10/04/2023)  Social Connections: Unknown (10/04/2023)  Tobacco Use: Medium Risk (10/04/2023)   SDOH Interventions:     Readmission Risk Interventions     No data to display

## 2023-10-07 NOTE — Procedures (Signed)
 Patient Name: Anthony Chambers  MRN: [id number]  Epilepsy Attending: Arlin MALVA Krebs  Referring Physician/Provider: Josette Ade, MD  Date: 10/07/2023 Duration: 31.28 mins  Patient history: 78yo M with ams. EEG to evaluate for seizure  Level of alertness: Awake  AEDs during EEG study: None  Technical aspects: This EEG study was done with scalp electrodes positioned according to the 10-20 International system of electrode placement. Electrical activity was reviewed with band pass filter of 1-70Hz , sensitivity of 7 uV/mm, display speed of 24mm/sec with a 60Hz  notched filter applied as appropriate. EEG data were recorded continuously and digitally stored.  Video monitoring was available and reviewed as appropriate.  Description: The posterior dominant rhythm consists of 8 Hz activity of moderate voltage (25-35 uV) seen predominantly in posterior head regions, symmetric and reactive to eye opening and eye closing. EEG showed intermittent generalized 3 to 6 Hz theta-delta slowing. Hyperventilation and photic stimulation were not performed.     ABNORMALITY - Intermittent slow, generalized  IMPRESSION: This study is suggestive of mild diffuse encephalopathy. No seizures or epileptiform discharges were seen throughout the recording.  Edwyna Dangerfield O Tron Flythe

## 2023-10-07 NOTE — Evaluation (Signed)
 Occupational Therapy Evaluation Patient Details Name: Anthony Chambers MRN: [id number] DOB: 26-Sep-1945 Today's Date: 10/07/2023   History of Present Illness   Patient is a 78 year old male with  altered mental status and rapid A-fib and CHF. PMH: schizophrenia, CHF, PAD, popliteal artery aneurysm s/p repair, COPD, OSA on BiPAP and chronic respiratory failure with hypoxia.     Clinical Impressions Chart reviewed to date, pt greeted semi supine in bed, oriented to self and place, agreeable to OT evaluation. Pt is a poor historian, per chart he is a resident at [facility], anticipate assist for ADL. Pt requires MIN A for bed mobility, STS with MOD A, unable to take steps up the bed. MIN A for lateral scoots of the bed 2 attempts. MIN A for oral care sitting on edge of bed with good sitting balance. Pt is left in bed, all needs met, OT will follow while admitted.      If plan is discharge home, recommend the following:   A lot of help with bathing/dressing/bathroom;A lot of help with walking and/or transfers;Supervision due to cognitive status     Functional Status Assessment   Patient has had a recent decline in their functional status and demonstrates the ability to make significant improvements in function in a reasonable and predictable amount of time.     Equipment Recommendations   Other (comment) (defer to next venue of care)     Recommendations for Other Services         Precautions/Restrictions   Precautions Precautions: Fall Recall of Precautions/Restrictions: Impaired Restrictions Weight Bearing Restrictions Per Provider Order: No     Mobility Bed Mobility Overal bed mobility: Needs Assistance Bed Mobility: Supine to Sit, Sit to Supine, Rolling Rolling: Supervision   Supine to sit: Min assist, HOB elevated, Used rails Sit to supine: Min assist   General bed mobility comments: frequent multi modal cues for technique    Transfers Overall transfer level:  Needs assistance Equipment used: Rolling walker (2 wheels) Transfers: Sit to/from Stand Sit to Stand: Mod assist (from ICU bed)           General transfer comment: lateral scoot up the bed with MIN A 2x to the L      Balance Overall balance assessment: Needs assistance Sitting-balance support: Feet supported Sitting balance-Leahy Scale: Fair     Standing balance support: Bilateral upper extremity supported, During functional activity, Reliant on assistive device for balance Standing balance-Leahy Scale: Poor                             ADL either performed or assessed with clinical judgement   ADL Overall ADL's : Needs assistance/impaired     Grooming: Oral care;Sitting;Minimal assistance               Lower Body Dressing: Maximal assistance Lower Body Dressing Details (indicate cue type and reason): socks     Toileting- Clothing Manipulation and Hygiene: Maximal assistance               Vision Patient Visual Report: No change from baseline       Perception         Praxis         Pertinent Vitals/Pain Pain Assessment Pain Assessment: CPOT Facial Expression: Relaxed, neutral Body Movements: Absence of movements Muscle Tension: Relaxed Compliance with ventilator (intubated pts.): N/A Vocalization (extubated pts.): N/A CPOT Total: 0 Pain Intervention(s): Monitored during session  Extremity/Trunk Assessment Upper Extremity Assessment Upper Extremity Assessment: Generalized weakness   Lower Extremity Assessment Lower Extremity Assessment: Generalized weakness       Communication Communication Communication: No apparent difficulties   Cognition Arousal: Alert Behavior During Therapy: Flat affect Cognition: History of cognitive impairments, Cognition impaired, No family/caregiver present to determine baseline   Orientation impairments: Time, Situation Awareness: Intellectual awareness impaired, Online awareness  impaired Memory impairment (select all impairments): Declarative long-term memory, Short-term memory Attention impairment (select first level of impairment): Sustained attention Executive functioning impairment (select all impairments): Sequencing, Reasoning, Problem solving, Organization, Initiation                   Following commands: Impaired Following commands impaired: Follows one step commands with increased time     Cueing  General Comments   Cueing Techniques: Verbal cues;Tactile cues;Visual cues  pt on 6L via [place], ?pleth in sitting but appears >88%, pt reports no SOB   Exercises Other Exercises Other Exercises: edu re role of OT, role of rehab   Shoulder Instructions      Home Living Family/patient expects to be discharged to:: Skilled nursing facility                                        Prior Functioning/Environment Prior Level of Function : Needs assist;Patient poor historian/Family not available             Mobility Comments: patient reports he is in a wheelchair primarily but can ambulate with a rolling walker with staff assistance ADLs Comments: pt reports he needs assist for ADL/IADL    OT Problem List: Decreased strength;Impaired balance (sitting and/or standing);Decreased activity tolerance;Decreased knowledge of use of DME or AE;Cardiopulmonary status limiting activity   OT Treatment/Interventions: Self-care/ADL training;Therapeutic exercise;Patient/family education;Balance training;Therapeutic activities;Energy conservation;DME and/or AE instruction      OT Goals(Current goals can be found in the care plan section)   Acute Rehab OT Goals Patient Stated Goal: sit up OT Goal Formulation: With patient Time For Goal Achievement: 10/21/23 Potential to Achieve Goals: Good ADL Goals Pt Will Perform Grooming: with supervision;sitting Pt Will Perform Lower Body Dressing: with min assist Pt Will Transfer to Toilet: with min  assist Pt Will Perform Toileting - Clothing Manipulation and hygiene: with min assist   OT Frequency:  Min 2X/week    Co-evaluation              AM-PAC OT 6 Clicks Daily Activity     Outcome Measure Help from another person eating meals?: A Little Help from another person taking care of personal grooming?: A Little Help from another person toileting, which includes using toliet, bedpan, or urinal?: A Lot Help from another person bathing (including washing, rinsing, drying)?: A Lot Help from another person to put on and taking off regular upper body clothing?: A Lot Help from another person to put on and taking off regular lower body clothing?: A Lot 6 Click Score: 14   End of Session Equipment Utilized During Treatment: Rolling walker (2 wheels) Nurse Communication: Mobility status  Activity Tolerance: Patient tolerated treatment well Patient left: with call bell/phone within reach;with bed alarm set;in bed  OT Visit Diagnosis: Other abnormalities of gait and mobility (R26.89);Muscle weakness (generalized) (M62.81)                Time: 8591-8573 OT Time Calculation (min): 18 min Charges:  OT  General Charges $OT Visit: 1 Visit OT Evaluation $OT Eval Moderate Complexity: 1 Mod  Therisa Sheffield, OTD OTR/L  10/07/23, 3:57 PM

## 2023-10-07 NOTE — Plan of Care (Addendum)
 Patient is alert to self and place this shift. Denies pain. VSS this shift. Patient tolerated bipap well this shift. Oral care offered with repositioning. High fall risk,  bed alarm on. Call light within reach.   Problem: Activity: Goal: Capacity to carry out activities will improve Outcome: Progressing   Problem: Cardiac: Goal: Ability to achieve and maintain adequate cardiopulmonary perfusion will improve Outcome: Progressing   Problem: Clinical Measurements: Goal: Ability to maintain clinical measurements within normal limits will improve Outcome: Progressing Goal: Will remain free from infection Outcome: Progressing Goal: Diagnostic test results will improve Outcome: Progressing Goal: Respiratory complications will improve Outcome: Progressing Goal: Cardiovascular complication will be avoided Outcome: Progressing   Problem: Nutrition: Goal: Adequate nutrition will be maintained Outcome: Progressing   Problem: Coping: Goal: Level of anxiety will decrease Outcome: Progressing   Problem: Elimination: Goal: Will not experience complications related to urinary retention Outcome: Progressing   Problem: Pain Managment: Goal: General experience of comfort will improve and/or be controlled Outcome: Progressing   Problem: Safety: Goal: Ability to remain free from injury will improve Outcome: Progressing   Problem: Elimination: Goal: Will not experience complications related to bowel motility Outcome: Not Progressing

## 2023-10-07 NOTE — Care Management Important Message (Signed)
 Important Message  Patient Details  Name: ELZA SORTOR MRN: [id number] Date of Birth: 05-30-1945   Important Message Given:  Yes - Medicare IM     Rojelio SHAUNNA Rattler 10/07/2023, 4:01 PM

## 2023-10-07 NOTE — Progress Notes (Signed)
 Daily Progress Note   Patient Name: Anthony Chambers       Date: 10/07/2023 DOB: Aug 24, 1945  Age: 78 y.o. MRN#: [id number] Attending Physician: Josette Ade, MD Primary Care Physician: Eilleen Query, MD Admit Date: 10/04/2023  Reason for Consultation/Follow-up: Establishing goals of care  Subjective: Notes, diagnostics, and labs reviewed.  Spoke with nurse in detail regarding updates.  She discusses that the patient was waking up for her and when jerked back to sleep.  In to see patient.  He is currently resting in bed on BiPAP at this time.  After speaking his name and with touch of his arm, he awakened.  Immediately upon awakening he was working to try to remove the BiPAP mask.  In the middle of trying to do this he fell back asleep.   Case discussed with attending.  Case discussed in great detail with hospice liaison as patient is an [facility] hospice patient.  Per conversation, [facility] liaison will call patient's sister today to discuss goals of care, and will reach out to inpatient PMT team if needs arise.  Length of Stay: 2  Current Medications: Scheduled Meds:   Chlorhexidine  Gluconate Cloth  6 each Topical Nightly   divalproex   250 mg Oral Daily   ipratropium-albuterol   3 mL Nebulization Q6H   OLANZapine   10 mg Oral QHS   pantoprazole   40 mg Oral Daily   rivaroxaban   20 mg Oral Q supper   tamsulosin   0.4 mg Oral QPC supper   umeclidinium bromide   1 puff Inhalation Daily    Continuous Infusions:  amiodarone  30 mg/hr (10/07/23 0700)   cefTRIAXone (ROCEPHIN)  IV 2 g (10/07/23 1215)   doxycycline (VIBRAMYCIN) IV Stopped (10/07/23 0020)    PRN Meds: acetaminophen  **OR** acetaminophen , albuterol , mouth rinse  Physical Exam Constitutional:      Comments: Opens eyes to  voice and touch  Pulmonary:     Comments: On BiPAP Skin:    General: Skin is warm and dry.             Vital Signs: BP 115/71   Pulse (!) 53   Temp 99.9 F (37.7 C) (Axillary) Comment: Bare hugger removed  Resp 17   Ht 6' 3 (1.905 m)   Wt 100 kg   SpO2 97%   BMI 27.56 kg/m  SpO2:  SpO2: 97 % O2 Device: O2 Device: Bi-PAP O2 Flow Rate: O2 Flow Rate (L/min): 8 L/min  Intake/output summary:  Intake/Output Summary (Last 24 hours) at 10/07/2023 1357 Last data filed at 10/07/2023 0900 Gross per 24 hour  Intake 1044.84 ml  Output 800 ml  Net 244.84 ml   LBM: Last BM Date :  (PTA) Baseline Weight: Weight: 99.8 kg Most recent weight: Weight: 100 kg    Patient Active Problem List   Diagnosis Date Noted   Hypotension 10/06/2023   Hypothermia 10/05/2023   Acute metabolic encephalopathy 10/05/2023   Diarrhea 10/05/2023   Depression 10/05/2023   Atrial fibrillation with RVR (HCC) 10/04/2023   Acute on chronic diastolic CHF (congestive heart failure) (HCC) 10/04/2023   Acute on chronic respiratory failure with hypoxia and hypercapnia (HCC) 10/04/2023   Schizophrenia (HCC) 10/04/2023   COPD (chronic obstructive pulmonary disease) (HCC) 12/16/2020   Rash and nonspecific skin eruption 10/14/2020   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  PMT will remain available as needed.  Code Status:    Code Status Orders  (From admission, onward)           Start     Ordered   10/05/23 1803  Do not attempt resuscitation (DNR)- Limited -Do Not Intubate (DNI)  (Code  Status)  Continuous       Question Answer Comment  If pulseless and not breathing No CPR or chest compressions.   In Pre-Arrest Conditions (Patient Is Breathing and Has A Pulse) Do not intubate. Provide all appropriate non-invasive medical interventions. Avoid ICU transfer unless indicated or required.   Consent: Discussion documented in EHR or advanced directives reviewed      10/05/23 1802           Code Status History     Date Active Date Inactive Code Status Order ID Comments User Context   10/04/2023 1252 10/05/2023 1802 Full Code [id number]  Seena Marsa NOVAK, MD ED   08/07/2020 2038 08/11/2020 1908 Full Code [id number]  Franky Redia SAILOR, MD ED   06/08/2018 1658 06/09/2018 1939 DNR [id number]  Parris Manna, MD Inpatient   06/01/2018 2046 06/08/2018 1658 Full Code [id number]  Runell Redia PARAS, MD Inpatient     Camelia Lewis, NP  Please contact Palliative Medicine Team phone at [phone] for questions and concerns.

## 2023-10-07 NOTE — Progress Notes (Signed)
 Heart Failure Navigator Progress Note  Assessed for Heart & Vascular TOC clinic readiness.  Patient  is active with [facility] services at [facility] already.  Navigator will sign off at this time.  Charmaine Pines, RN, BSN [facility] Heart Failure Navigator Secure Chat Only

## 2023-10-07 NOTE — Progress Notes (Addendum)
 [facility] Room IC 3 [facility] Liaison Note   HL was able to reach sister, Dorothe Mace, today via phone to discuss goals of care.  Ms. Mace reported that she only wants comfort support for the patient and is ok with him returning to [facility].  Hospital team notified.    Please call  with any hospice related questions or concerns.    [facility] Liaison [phone]

## 2023-10-07 NOTE — Progress Notes (Signed)
 [facility] Hospitalized Hospice Patient   [name] is a current hospice patient followed at [facility] for terminal diagnosis of Chronic Diastolic Heart Failure.   Staff at patient's facility called 911 due to patient having altered mental status.  Patient is was admitted to [facility] 9.26.25 with primary diagnosis of Acute encephalopathy with Acute on chronic respiratory failure with hypoxia.  Patient also has a new onset of Atrial Fib with RVR.   Per Dr. Norleen Laurence, this is a hospice related hospitalization.    [name] observed lying in bed, sleeping, with 02 in place at 8L/min.     [name] remains appropriate for GIP level of care requiring ICU care to monitor effectiveness of medical interventions including amiodarone  gtt, BiPap and symptom management of shortness of breath.  I/O:  1104.8/800   Vital Signs: BP 115/71 P 53, R 17   Oxi 96 %  on HFNC 8L/min   Abnormal labs: Chloride 97, CO@ 33, Calcium 7.8, RBC 3.68, Hemoglobin 10.7, HCT 32.2, Platelets 145   IV Meds & PRN Amiodarone  gtt Doxycycline 100mg  BID   Diagnostics:  None new.    Hospital Problem:  per Dr. Charlie Patterson Progress note 9.29.25  Acute metabolic encephalopathy Patient still with altered mental status today.  MRI of the brain does not show any acute stroke but does have white matter T2 hyperintensity bilaterally left greater than right.  Will have neurology review the MRI.  Depakote  level normal.  White blood cell count normal.  Chest x-ray possibly showing infiltrates empirically put on Rocephin and doxycycline.   Acute on chronic diastolic CHF (congestive heart failure) (HCC) Hold IV Lasix  with altered mental status currently and poor appetite.   Hypotension Briefly required Levophed on 9/27.   Acute on chronic respiratory failure with hypoxia and hypercapnia (HCC) Continue BiPAP for high PaCO2 at night and also when sleeping.  Was on bubble high flow nasal cannula 8 L when off  BiPAP.  Chronically wears 4 L.   Hypothermia Needed warming blanket yesterday.  Follow-up blood cultures.  Empirically placed on antibiotics yesterday.   Atrial fibrillation with RVR (HCC) Patient on amiodarone  drip.  Xarelto  if able to take.   Diarrhea Stool studies if further diarrhea   COPD (chronic obstructive pulmonary disease) (HCC) Does not seem to be in exacerbation at this time.   DVT (deep venous thrombosis) (HCC) On Xarelto    Depression Hold Zoloft    Schizophrenia (HCC) Continue Depakote  if able to take since the level is normal.  Continue lower dose Zyprexa  at night.   PVD (peripheral vascular disease) On Xarelto    Discharge Plan:  ongoing Goals of Care: DNR as of 9.27.25 IDT:  updated Family Contact: no family contact today.     Please do not hesitate to call with any hospice related questions or concerns.   [facility] Liaison [phone]

## 2023-10-07 NOTE — Progress Notes (Signed)
 Eeg done

## 2023-10-07 NOTE — Progress Notes (Signed)
 Progress Note   Patient: Anthony Chambers FMW:[id number] DOB: August 19, 1945 DOA: 10/04/2023     2 DOS: the patient was seen and examined on 10/07/2023   Brief hospital course: 78 y.o. male with medical history significant of hyperlipidemia, GERD, PAD, popliteal artery aneurysm s/p repair, chronic diastolic CHF, COPD, OSA on BiPAP, chronic respiratory failure with hypoxia, DVT presenting with altered mental status.   History obtained with assistance of chart review.   Patient was noted to be altered this morning.  EMS was called.  Patient typically uses BiPAP at night and 4 L during the day due to his chronic respiratory failure and OSA.  Did not have BiPAP overnight for unclear reason and unclear if he has been without it for any longer than that.   Patient more alert now in the ED after BiPAP.  Still appears somewhat altered and chart reveals history of schizophrenia but is currently medicated.  Unclear baseline.  Does indicate that he had some issues with his BiPAP previously but unclear how long.   Denies specific complaints but not able to fully participate in review of systems.   ED Course: Vital signs in ED notable for heart rate in the 130s, respiratory rate in the 20s, blood pressure in the 110s-130 systolic.  Requiring BiPAP initially.   Lab workup included CMP with chloride 95, bicarb 38, glucose 104, calcium 8.4.  CBC with hemoglobin stable at 12.4.  Troponin 18, repeat pending.  BNP elevated to 608.  Respiratory panel negative for flu COVID and RSV.  Magnesium normal.  Urinalysis pending.  ABG with normal pH with pCO2 elevated to 67.   Chest x-ray showed low lung volume, bilateral interstitial prominence, questionable right pleural effusion, no evidence of pulmonary edema.  CT head showed no acute abnormality.   Patient received 5 mg IV metoprolol , 25 mg p.o. metoprolol , 60 mg IV Lasix , BiPAP in the ED.  9/27.  Patient inability to answer some simple questions, straight leg raise and  lift up his arms.  Nursing staff stated that he refused to wear the BiPAP.  In the ER patient became less responsive and required to go on BiPAP and Levophed. 9/28.  Mental status still impaired.  Depakote  level normal.  Will get MRI of the brain.  Continue BiPAP with elevated pCO2.  Patient unable to take oral medications this morning. 9/29.  Patient seen this morning on BiPAP.  I asked him if he feels okay and he stated he felt fine.  He is tired.  Blood so he stated to me and went back to sleep.   Assessment and Plan: * Acute metabolic encephalopathy Patient still with altered mental status today.  MRI of the brain does not show any acute stroke but does have white matter T2 hyperintensity bilaterally left greater than right.  Will have neurology review the MRI.  Depakote  level normal.  White blood cell count normal.  Chest x-ray possibly showing infiltrates empirically put on Rocephin and doxycycline.  Acute on chronic diastolic CHF (congestive heart failure) (HCC) Hold IV Lasix  with altered mental status currently and poor appetite.  Hypotension Briefly required Levophed on 9/27.  Acute on chronic respiratory failure with hypoxia and hypercapnia (HCC) Continue BiPAP for high PaCO2 at night and also when sleeping.  Was on bubble high flow nasal cannula 8 L when off BiPAP.  Chronically wears 4 L.  Hypothermia Needed warming blanket yesterday.  Follow-up blood cultures.  Empirically placed on antibiotics yesterday.  Atrial fibrillation with RVR (HCC)  Patient on amiodarone  drip.  Xarelto  if able to take.  Diarrhea Stool studies if further diarrhea  COPD (chronic obstructive pulmonary disease) (HCC) Does not seem to be in exacerbation at this time.  DVT (deep venous thrombosis) (HCC) On Xarelto   Depression Hold Zoloft   Schizophrenia (HCC) Continue Depakote  if able to take since the level is normal.  Continue lower dose Zyprexa  at night.  PVD (peripheral vascular disease) On  Xarelto         Subjective: Patient stated he felt fine.  He stated he felt tired.  He answered no if he was hungry.  Went back to sleep.  Seen this morning while on BiPAP.  Admitted with altered mental status.  Physical Exam: Vitals:   10/07/23 0900 10/07/23 0941 10/07/23 1000 10/07/23 1024  BP: (!) 103/56 119/71 115/71   Pulse: (!) 58 70 (!) 53   Resp: 18 (!) 22 17   Temp:      TempSrc:      SpO2: 94% 92% 96% 97%  Weight:      Height:       Physical Exam HENT:     Head: Normocephalic.  Eyes:     General: Lids are normal.     Conjunctiva/sclera: Conjunctivae normal.  Cardiovascular:     Rate and Rhythm: Normal rate. Rhythm irregularly irregular.     Heart sounds: Normal heart sounds, S1 normal and S2 normal.  Pulmonary:     Breath sounds: Examination of the right-lower field reveals decreased breath sounds. Examination of the left-lower field reveals decreased breath sounds. Decreased breath sounds present. No wheezing, rhonchi or rales.  Abdominal:     Palpations: Abdomen is soft.     Tenderness: There is no abdominal tenderness.  Musculoskeletal:     Right lower leg: Swelling present.     Left lower leg: Swelling present.  Skin:    General: Skin is warm.     Comments: Skin tear left leg, chronic lower extremity discoloration  Neurological:     Mental Status: He is lethargic.     Comments: Moved his arms on his own.     Data Reviewed: Last ABG shows a pCO2 of 72, creatinine 1.09, white blood count 4.5, hemoglobin 10.7, platelet count 145  Family Communication: Left message for patient's sister  Disposition: Status is: Inpatient Remains inpatient appropriate because: Answers a few questions but is still lethargic.  Patient on BiPAP.  Palliative care following.  Planned Discharge Destination: To be determined based on the progress    Time spent: 28 minutes  Author: Charlie Patterson, MD 10/07/2023 10:52 AM  For on call review [email or web address].

## 2023-10-08 DIAGNOSIS — G9341 Metabolic encephalopathy: Secondary | ICD-10-CM | POA: Diagnosis not present

## 2023-10-08 DIAGNOSIS — J9621 Acute and chronic respiratory failure with hypoxia: Secondary | ICD-10-CM | POA: Diagnosis not present

## 2023-10-08 DIAGNOSIS — I9589 Other hypotension: Secondary | ICD-10-CM

## 2023-10-08 DIAGNOSIS — I5033 Acute on chronic diastolic (congestive) heart failure: Secondary | ICD-10-CM | POA: Diagnosis not present

## 2023-10-08 LAB — GLUCOSE, CAPILLARY
Glucose-Capillary: 91 mg/dL (ref 70–99)
Glucose-Capillary: 82 mg/dL (ref 70–99)
Glucose-Capillary: 86 mg/dL (ref 70–99)

## 2023-10-08 MED ORDER — MORPHINE SULFATE (CONCENTRATE) 10 MG /0.5 ML PO SOLN: 5.0000 mg | ORAL | 0 refills | Status: AC | PRN

## 2023-10-08 MED ORDER — DOXYCYCLINE HYCLATE 100 MG PO TABS: 100.0000 mg | ORAL_TABLET | Freq: Two times a day (BID) | ORAL | 0 refills | Status: AC

## 2023-10-08 MED ORDER — AMIODARONE HCL 200 MG PO TABS: ORAL_TABLET | ORAL | 0 refills | Status: AC

## 2023-10-08 MED ORDER — AMOXICILLIN-POT CLAVULANATE 875-125 MG PO TABS: 1.0000 | ORAL_TABLET | Freq: Two times a day (BID) | ORAL | 0 refills | Status: AC

## 2023-10-08 MED ORDER — OLANZAPINE 5 MG PO TABS: 5.0000 mg | ORAL_TABLET | Freq: Every day | ORAL | 0 refills | Status: AC

## 2023-10-08 MED ORDER — FUROSEMIDE 10 MG/ML IJ SOLN
40.0000 mg | Freq: Once | INTRAMUSCULAR | Status: AC
  Administered 2023-10-08: 40 mg via INTRAVENOUS
  Filled 2023-10-08: qty 4

## 2023-10-08 NOTE — Plan of Care (Signed)
 Talked with hospice liaison yesterday, with plans for hospice liaison to speak with family and reach out to inpatient PMT if needed.  Received an epic chat this morning confirming that hospice liaison has spoken with the family and goals are set to shift to comfort care.  PMT will sign off at this time.

## 2023-10-08 NOTE — Plan of Care (Signed)
  Problem: Pain Managment: Goal: General experience of comfort will improve and/or be controlled Outcome: Progressing   Problem: Safety: Goal: Ability to remain free from injury will improve Outcome: Progressing

## 2023-10-08 NOTE — Plan of Care (Signed)
  Problem: Cardiac: Goal: Ability to achieve and maintain adequate cardiopulmonary perfusion will improve Outcome: Progressing   Problem: Clinical Measurements: Goal: Ability to maintain clinical measurements within normal limits will improve Outcome: Progressing   Problem: Coping: Goal: Level of anxiety will decrease Outcome: Progressing   Problem: Respiratory: Goal: Verbalizations of increased ease of respirations will increase Outcome: Progressing   Problem: Pain Management: Goal: Satisfaction with pain management regimen will improve Outcome: Progressing

## 2023-10-08 NOTE — TOC Transition Note (Signed)
 Transition of Care [facility]) - Discharge Note   Patient Details  Name: Anthony Chambers MRN: [id number] Date of Birth: [birth year]  Transition of [facility] CM/SW Contact:  Dalia GORMAN Fuse, RN Phone Number: 10/08/2023, 4:19 PM   Clinical Narrative:     TOC confirmed with the MD that the patient can use the prior bipap settings. The patient is medically clear to discharge to [facility]. [facility] will transport. The patient is 9th or 10th in line for transport they anticipate it will be 4 or 5 hours. FL2 and DC summary sent to the facility via hub.  Nurse to call report to [facility] [phone].      Barriers to Discharge: Continued Medical Work up   Patient Goals and CMS Choice            Discharge Placement                       Discharge Plan and Services Additional resources added to the After Visit Summary for                                       Social Drivers of Health (SDOH) Interventions SDOH Screenings   Food Insecurity: Patient Unable To Answer (10/04/2023)  Housing: Patient Unable To Answer (10/04/2023)  Transportation Needs: Patient Unable To Answer (10/04/2023)  Utilities: Patient Unable To Answer (10/04/2023)  Social Connections: Unknown (10/04/2023)  Tobacco Use: Medium Risk (10/04/2023)     Readmission Risk Interventions     No data to display

## 2023-10-08 NOTE — Progress Notes (Signed)
 Pt transfer from ICU; Pt alert, vitals taken, 02 sat 97 % on 6L HFNC. RT at bedside setting bipap for bedtime; pt tolerated well, continue to monitor.

## 2023-10-08 NOTE — Discharge Summary (Addendum)
 Physician Discharge Summary   Patient: Anthony Chambers MRN: [id number] DOB: October 27, 1945  Admit date:     10/04/2023  Discharge date: 10/08/23  Discharge Physician: Charlie Patterson   PCP: Eilleen Query, MD   Recommendations at discharge:    Follow up with Doctor at facility one day Follow up with hospice at facility one day  Discharge Diagnoses: Principal Problem:   Acute metabolic encephalopathy Active Problems:   Acute on chronic diastolic CHF (congestive heart failure) (HCC)   Acute on chronic respiratory failure with hypoxia and hypercapnia (HCC)   Hypotension   Atrial fibrillation with RVR (HCC)   Hypothermia   Diarrhea   COPD (chronic obstructive pulmonary disease) (HCC)   DVT (deep venous thrombosis) (HCC)   GERD (gastroesophageal reflux disease)   Hyperlipidemia   PVD (peripheral vascular disease)   Popliteal artery aneurysm   Schizophrenia (HCC)   Depression  Resolved Problems:   * No resolved hospital problems. *  Hospital Course: 77 y.o. male with medical history significant of hyperlipidemia, GERD, PAD, popliteal artery aneurysm s/p repair, chronic diastolic CHF, COPD, OSA on BiPAP, chronic respiratory failure with hypoxia, DVT presenting with altered mental status.   History obtained with assistance of chart review.   Patient was noted to be altered this morning.  EMS was called.  Patient typically uses BiPAP at night and 4 L during the day due to his chronic respiratory failure and OSA.  Did not have BiPAP overnight for unclear reason and unclear if he has been without it for any longer than that.   Patient more alert now in the ED after BiPAP.  Still appears somewhat altered and chart reveals history of schizophrenia but is currently medicated.  Unclear baseline.  Does indicate that he had some issues with his BiPAP previously but unclear how long.   Denies specific complaints but not able to fully participate in review of systems.   ED Course: Vital signs  in ED notable for heart rate in the 130s, respiratory rate in the 20s, blood pressure in the 110s-130 systolic.  Requiring BiPAP initially.   Lab workup included CMP with chloride 95, bicarb 38, glucose 104, calcium 8.4.  CBC with hemoglobin stable at 12.4.  Troponin 18, repeat pending.  BNP elevated to 608.  Respiratory panel negative for flu COVID and RSV.  Magnesium normal.  Urinalysis pending.  ABG with normal pH with pCO2 elevated to 67.   Chest x-ray showed low lung volume, bilateral interstitial prominence, questionable right pleural effusion, no evidence of pulmonary edema.  CT head showed no acute abnormality.   Patient received 5 mg IV metoprolol , 25 mg p.o. metoprolol , 60 mg IV Lasix , BiPAP in the ED.  9/27.  Patient inability to answer some simple questions, straight leg raise and lift up his arms.  Nursing staff stated that he refused to wear the BiPAP.  In the ER patient became less responsive and required to go on BiPAP and Levophed. 9/28.  Mental status still impaired.  Depakote  level normal.  Will get MRI of the brain.  Continue BiPAP with elevated pCO2.  Patient unable to take oral medications this morning. 9/29.  Patient seen this morning on BiPAP.  I asked him if he feels okay and he stated he felt fine.  He is tired.  Blood so he stated to me and went back to sleep. 9/30  Mental status improved.  Will get back to facility with hospice following.  Must wear Bipap at night and oxygen during  day.  Can use previous bipap settings from the facility  Assessment and Plan: * Acute metabolic encephalopathy Patient with better mental status today.  MRI of the brain does not show any acute stroke but does have white matter T2 hyperintensity bilaterally left greater than right.  Depakote  level normal.  White blood cell count normal.  Chest x-ray possibly showing infiltrates empirically put on augmentin and doxycycline. Eeg- no seizure.  Acute on chronic diastolic CHF (congestive heart  failure) (HCC) Iv lasix  last night and this am and can go back on oral lasix  at facility  Hypotension Briefly required Levophed on 9/27.  Acute on chronic respiratory failure with hypoxia and hypercapnia (HCC) Continue BiPAP for high PaCO2 at night and also when taking naps.  Was on bubble high flow nasal cannula 8 L when off BiPAP.  Chronically wears 4 L.  Will get back to 4 L.  Patient is a DNR and hospice is following.  If patient rapidly declines, can consider full comfort measures.  Hypothermia Needed warming blanket on admission.  BC negative for three days  Atrial fibrillation with RVR (HCC) Patient on amiodarone  drip and switched over to oral amiodarone .  Xarelto .  Diarrhea No further diarrhea  COPD (chronic obstructive pulmonary disease) (HCC) Does not seem to be in exacerbation at this time.  DVT (deep venous thrombosis) (HCC) On Xarelto   Depression Hold Zoloft   Schizophrenia (HCC) Continue Depakote  and Continue lower dose Zyprexa  at night.  PVD (peripheral vascular disease) On Xarelto          Consultants: critical care team, palliative care, hospice Procedures performed: none  Disposition: long term care Diet recommendation:  Heart healthy DISCHARGE MEDICATION: Allergies as of 10/08/2023       Reactions   Heparin         Medication List     STOP taking these medications    cetirizine 10 MG tablet Commonly known as: ZYRTEC   ergocalciferol 1.25 MG (50000 UT) capsule Commonly known as: VITAMIN D2   losartan 50 MG tablet Commonly known as: COZAAR   metoprolol  succinate 25 MG 24 hr tablet Commonly known as: TOPROL -XL   OLANZapine  zydis 10 MG disintegrating tablet Commonly known as: ZYPREXA  Replaced by: OLANZapine  5 MG tablet   sennosides-docusate sodium  8.6-50 MG tablet Commonly known as: SENOKOT-S   sertraline  25 MG tablet Commonly known as: ZOLOFT    traMADol HCl 25 MG Tabs       TAKE these medications    acetaminophen  500  MG tablet Commonly known as: TYLENOL  Take 1,000 mg by mouth 2 (two) times daily.   albuterol  108 (90 Base) MCG/ACT inhaler Commonly known as: VENTOLIN  HFA Inhale 2 puffs into the lungs every 4 (four) hours as needed for wheezing or shortness of breath.   amiodarone  200 MG tablet Commonly known as: PACERONE  2 tabs po twice a day for five days then, one tab po twice a day for one week then one tab po daily   amoxicillin-clavulanate 875-125 MG tablet Commonly known as: AUGMENTIN Take 1 tablet by mouth every 12 (twelve) hours for 2 days.   divalproex  250 MG 24 hr tablet Commonly known as: DEPAKOTE  ER Take 250 mg by mouth daily.   doxycycline 100 MG tablet Commonly known as: VIBRA-TABS Take 1 tablet (100 mg total) by mouth every 12 (twelve) hours for 2 days.   furosemide  40 MG tablet Commonly known as: LASIX  Take 40 mg by mouth daily.   Invega Sustenna injection Generic drug: Paliperidone ER Inject 0.5  mLs into the muscle every 30 (thirty) days. On the third Monday of each month   MiraLax 17 g packet Generic drug: polyethylene glycol Take 17 g by mouth daily.   morphine  CONCENTRATE 10 mg / 0.5 ml concentrated solution Take 0.25 mLs (5 mg total) by mouth every 2 (two) hours as needed for severe pain (pain score 7-10) or shortness of breath.   OLANZapine  5 MG tablet Commonly known as: ZYPREXA  Take 1 tablet (5 mg total) by mouth at bedtime. Replaces: OLANZapine  zydis 10 MG disintegrating tablet   omeprazole 20 MG capsule Commonly known as: PRILOSEC Take 20 mg by mouth daily.   OXYGEN Inhale 4 L/min into the lungs continuous.   rivaroxaban  20 MG Tabs tablet Commonly known as: XARELTO  Take 20 mg by mouth daily.   tamsulosin  0.4 MG Caps capsule Commonly known as: FLOMAX  Take 1 capsule (0.4 mg total) by mouth daily after supper.   Tiotropium Bromide  Monohydrate 2.5 MCG/ACT Aers Inhale 2 puffs into the lungs daily.               Durable Medical Equipment   (From admission, onward)           Start     Ordered   10/08/23 0900  For home use only DME Bipap  Once       Question Answer Comment  Length of Need Lifetime   Bleed in oxygen (LPM) 4   Inspiratory pressure 12   Expiratory pressure 5      10/08/23 0900            Follow-up Information     doctor at facility Follow up in 1 day(s).          hospice at facility Follow up in 1 day(s).                 Discharge Exam: Filed Weights   10/06/23 0428 10/07/23 0500 10/08/23 0500  Weight: 102.6 kg 100 kg 100.8 kg   Physical Exam HENT:     Head: Normocephalic.  Eyes:     General: Lids are normal.     Conjunctiva/sclera: Conjunctivae normal.  Cardiovascular:     Rate and Rhythm: Normal rate. Rhythm irregularly irregular.     Heart sounds: Normal heart sounds, S1 normal and S2 normal.  Pulmonary:     Breath sounds: Examination of the right-lower field reveals decreased breath sounds. Examination of the left-lower field reveals decreased breath sounds. Decreased breath sounds present. No wheezing, rhonchi or rales.  Abdominal:     Palpations: Abdomen is soft.     Tenderness: There is no abdominal tenderness.  Musculoskeletal:     Right lower leg: Swelling present.     Left lower leg: Swelling present.  Skin:    General: Skin is warm.     Comments: Skin tear left leg, chronic lower extremity discoloration  Neurological:     Mental Status: He is alert.     Comments: Answering questions appropriately      Condition at discharge: fair  The results of significant diagnostics from this hospitalization (including imaging, microbiology, ancillary and laboratory) are listed below for reference.   Imaging Studies: EEG adult Result Date: 10/07/2023 Shelton Arlin KIDD, MD     10/07/2023  4:06 PM Patient Name: PARLEY PIDCOCK MRN: [id number] Epilepsy Attending: Arlin KIDD Shelton Referring Physician/Provider: Josette Ade, MD Date: 10/07/2023 Duration: 31.28 mins  Patient history: 78yo M with ams. EEG to evaluate for seizure Level of alertness: Awake  AEDs during EEG study: None Technical aspects: This EEG study was done with scalp electrodes positioned according to the 10-20 International system of electrode placement. Electrical activity was reviewed with band pass filter of 1-70Hz , sensitivity of 7 uV/mm, display speed of 20mm/sec with a 60Hz  notched filter applied as appropriate. EEG data were recorded continuously and digitally stored.  Video monitoring was available and reviewed as appropriate. Description: The posterior dominant rhythm consists of 8 Hz activity of moderate voltage (25-35 uV) seen predominantly in posterior head regions, symmetric and reactive to eye opening and eye closing. EEG showed intermittent generalized 3 to 6 Hz theta-delta slowing. Hyperventilation and photic stimulation were not performed.   ABNORMALITY - Intermittent slow, generalized IMPRESSION: This study is suggestive of mild diffuse encephalopathy. No seizures or epileptiform discharges were seen throughout the recording. Arlin MALVA Krebs   MR BRAIN WO CONTRAST Result Date: 10/06/2023 EXAM: MRI BRAIN WITHOUT CONTRAST 10/06/2023 02:46:36 PM TECHNIQUE: Multiplanar multisequence MRI of the head/brain was performed without the administration of intravenous contrast. The study is severely degraded by patient motion. COMPARISON: None available. CLINICAL HISTORY: Mental status change, unknown cause. Encounter for ams. Best obtainable images. Pt disoriented and unable to hold still. FINDINGS: BRAIN AND VENTRICLES: Diffusion-weighted images demonstrate no acute infarct. Confluent periventricular and subcortical white matter T2 hyperintensity is present bilaterally, left greater than right. 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 No intracranial hemorrhage. No mass. No midline shift. No hydrocephalus. The sella is unremarkable. Normal flow voids. Neuroquant measurements are  likely impacted by the patient motion. ORBITS: No acute abnormality. SINUSES AND MASTOIDS: No acute abnormality. BONES AND SOFT TISSUES: Normal marrow signal. No acute soft tissue abnormality. IMPRESSION: 1. No acute infarct. 2. Confluent periventricular and subcortical white matter T2 hyperintensity bilaterally, left greater than right. 3. 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 4. Study severely degraded by patient motion. Electronically signed by: Lonni Necessary MD 10/06/2023 03:34 PM EDT RP Workstation: [id number]   DG Chest Port 1 View Result Date: 10/06/2023 EXAM: 1 VIEW(S) XRAY OF THE CHEST 10/06/2023 05:28:29 AM COMPARISON: 10/04/2023 CLINICAL HISTORY: RESPIRATORY FAILURE FINDINGS: LUNGS AND PLEURA: Low lung volumes. Similar diffuse interstitial prominence. Increased patchy bibasilar opacities. No pulmonary edema. Trace right pleural effusion, unchanged. No pneumothorax. HEART AND MEDIASTINUM: No acute abnormality of the cardiac and mediastinal silhouettes. BONES AND SOFT TISSUES: No acute osseous abnormality. IMPRESSION: 1. Increased patchy bibasilar opacities, which may reflect atelectasis versus developing infection in the setting of low lung volumes. 2. Trace right pleural effusion, unchanged. 3. Diffuse increased interstitial markings concerning for mild edema. Electronically signed by: Waddell Calk MD 10/06/2023 06:41 AM EDT RP Workstation: [id number]   ECHOCARDIOGRAM COMPLETE Result Date: 10/05/2023    ECHOCARDIOGRAM REPORT   Patient Name:   ABDULLAHI VALLONE Date of Exam: 10/05/2023 Medical Rec #:  [id number]      Height:       75.0 in Accession #:    [id number]     Weight:       221.1 lb Date of Birth:  12-Oct-1945      BSA:          2.290 m Patient Age:    78 years       BP:           92/54 mmHg Patient Gender: M              HR:           82 bpm. Exam Location:  [facility] Procedure: 2D Echo (Both Spectral and Color Flow Doppler were utilized during            procedure). Indications:      CHF I50.31  History:         Patient has prior history of Echocardiogram examinations, most                  recent 08/08/2020.  Sonographer:     Thedora Louder RDCS, FASE Referring Phys:  [id number] MARSA NOVAK MELVIN Diagnosing Phys: Redell Cave MD  Sonographer Comments: Technically challenging study due to limited acoustic windows, no parasternal window, no apical window and no subcostal window. Image acquisition challenging due to patient behavioral factors. and Image acquisition challenging due to respiratory motion. Very limited study due to both no acoustic windows. IMPRESSIONS  1. Limited images, limited windows. probably normal LVEF. consider repeat echo for better evaluation if clinically indicated.. Left ventricular ejection fraction, by estimation, is 50 to 55%. The left ventricle has low normal function. Left ventricular endocardial border not optimally defined to evaluate regional wall motion. FINDINGS  Left Ventricle: Limited images, limited windows. probably normal LVEF. consider repeat echo for better evaluation if clinically indicated. Left ventricular ejection fraction, by estimation, is 50 to 55%. The left ventricle has low normal function. Left ventricular endocardial border not optimally defined to evaluate regional wall motion. Redell Cave MD Electronically signed by Redell Cave MD Signature Date/Time: 10/05/2023/5:31:46 PM    Final    CT HEAD WO CONTRAST ( ) Result Date: 10/04/2023 EXAM: CT HEAD WITHOUT CONTRAST 10/04/2023 10:45:52 AM TECHNIQUE: CT of the head was performed without the administration of intravenous contrast. Automated exposure control, iterative reconstruction, and/or weight based adjustment of the mA/kV was utilized to reduce the radiation dose to as low as reasonably achievable. COMPARISON: CT of the head dated 08/07/2020. CLINICAL HISTORY: Nonfocal altered. FINDINGS: BRAIN AND VENTRICLES: Age-related atrophy and mild-to-moderate diffuse cerebral white  matter disease. No acute hemorrhage. No evidence of acute infarct. No hydrocephalus. No extra-axial collection. No mass effect or midline shift. ORBITS: No acute abnormality. SINUSES: Mucosal disease within the right maxillary sinus. SOFT TISSUES AND SKULL: No acute soft tissue abnormality. No skull fracture. IMPRESSION: 1. No acute intracranial abnormality. 2. Age-related atrophy and mild-to-moderate diffuse cerebral white matter disease. 3. Right maxillary sinus mucosal disease. Electronically signed by: Evalene Coho MD 10/04/2023 10:51 AM EDT RP Workstation: [id number]   DG Chest Portable 1 View Result Date: 10/04/2023 EXAM: 1 VIEW(S) XRAY OF THE CHEST 10/04/2023 10:14:00 AM COMPARISON: 08/07/2020 CLINICAL HISTORY: altered, hypoxic. Pt bib EMS from [facility] due to AMS. EMS sts that facility was concerned for pt to have high co2. Per EMS pt is suppose to sleep with a bipap on at the facility however pt has not work it in some time due to them not having bipap for the pt. Pt ; wears 4l/min via [place] during the day. FINDINGS: LUNGS AND PLEURA: Low lung volumes with bilateral interstitial prominence. Possible small right pleural effusion. No pulmonary edema. No pneumothorax. HEART AND MEDIASTINUM: Borderline cardiomegaly. BONES AND SOFT TISSUES: No acute osseous abnormality. IMPRESSION: 1. Possible small right pleural effusion with low lung volumes and bilateral interstitial prominence. 2. Borderline cardiomegaly. Electronically signed by: Waddell Calk MD 10/04/2023 10:26 AM EDT RP Workstation: [id number]    Microbiology: Results for orders placed or performed during the hospital encounter of 10/04/23  Resp panel by RT-PCR (RSV, Flu A&B, Covid) Anterior Nasal Swab     Status: None  Collection Time: 10/04/23 10:14 AM   Specimen: Anterior Nasal Swab  Result Value Ref Range Status   SARS Coronavirus 2 by RT PCR NEGATIVE NEGATIVE Final    Comment: (NOTE) SARS-CoV-2 target nucleic acids are NOT  DETECTED.  The SARS-CoV-2 RNA is generally detectable in upper respiratory specimens during the acute phase of infection. The lowest concentration of SARS-CoV-2 viral copies this assay can detect is 138 copies/mL. A negative result does not preclude SARS-Cov-2 infection and should not be used as the sole basis for treatment or other patient management decisions. A negative result may occur with  improper specimen collection/handling, submission of specimen other than nasopharyngeal swab, presence of viral mutation(s) within the areas targeted by this assay, and inadequate number of viral copies(<138 copies/mL). A negative result must be combined with clinical observations, patient history, and epidemiological information. The expected result is Negative.  Fact Sheet for Patients:  BloggerCourse.com  Fact Sheet for Healthcare Providers:  SeriousBroker.it  This test is no t yet approved or cleared by the [place]  FDA and  has been authorized for detection and/or diagnosis of SARS-CoV-2 by FDA under an Emergency Use Authorization (EUA). This EUA will remain  in effect (meaning this test can be used) for the duration of the COVID-19 declaration under Section 564(b)(1) of the Act, 21 U.S.C.section 360bbb-3(b)(1), unless the authorization is terminated  or revoked sooner.       Influenza A by PCR NEGATIVE NEGATIVE Final   Influenza B by PCR NEGATIVE NEGATIVE Final    Comment: (NOTE) The Xpert Xpress SARS-CoV-2/FLU/RSV plus assay is intended as an aid in the diagnosis of influenza from Nasopharyngeal swab specimens and should not be used as a sole basis for treatment. Nasal washings and aspirates are unacceptable for Xpert Xpress SARS-CoV-2/FLU/RSV testing.  Fact Sheet for Patients: BloggerCourse.com  Fact Sheet for Healthcare Providers: SeriousBroker.it  This test is not yet  approved or cleared by the [place]  FDA and has been authorized for detection and/or diagnosis of SARS-CoV-2 by FDA under an Emergency Use Authorization (EUA). This EUA will remain in effect (meaning this test can be used) for the duration of the COVID-19 declaration under Section 564(b)(1) of the Act, 21 U.S.C. section 360bbb-3(b)(1), unless the authorization is terminated or revoked.     Resp Syncytial Virus by PCR NEGATIVE NEGATIVE Final    Comment: (NOTE) Fact Sheet for Patients: BloggerCourse.com  Fact Sheet for Healthcare Providers: SeriousBroker.it  This test is not yet approved or cleared by the [place]  FDA and has been authorized for detection and/or diagnosis of SARS-CoV-2 by FDA under an Emergency Use Authorization (EUA). This EUA will remain in effect (meaning this test can be used) for the duration of the COVID-19 declaration under Section 564(b)(1) of the Act, 21 U.S.C. section 360bbb-3(b)(1), unless the authorization is terminated or revoked.  Performed at [facility], [address] [place], [place] [address]   MRSA Next Gen by PCR, Nasal     Status: None   Collection Time: 10/04/23  4:05 PM   Specimen: Nasal Mucosa; Nasal Swab  Result Value Ref Range Status   MRSA by PCR Next Gen NOT DETECTED NOT DETECTED Final    Comment: (NOTE) The GeneXpert MRSA Assay (FDA approved for NASAL specimens only), is one component of a comprehensive MRSA colonization surveillance program. It is not intended to diagnose MRSA infection nor to guide or monitor treatment for MRSA infections. Test performance is not FDA approved in patients less than 47 years old.  Performed at [facility], [address] [place], [place] [address]   Culture, blood (Routine X 2) w Reflex to ID Panel     Status: None (Preliminary result)   Collection Time: 10/05/23 11:41 AM   Specimen: BLOOD  Result Value Ref Range  Status   Specimen Description BLOOD RIGHT ANTECUBITAL  Final   Special Requests   Final    BOTTLES DRAWN AEROBIC AND ANAEROBIC Blood Culture adequate volume   Culture   Final    NO GROWTH 3 DAYS Performed at [facility], [address] [place], [place] [address]    Report Status PENDING  Incomplete  Culture, blood (Routine X 2) w Reflex to ID Panel     Status: None (Preliminary result)   Collection Time: 10/05/23 11:41 AM   Specimen: BLOOD LEFT HAND  Result Value Ref Range Status   Specimen Description BLOOD LEFT HAND  Final   Special Requests   Final    BOTTLES DRAWN AEROBIC AND ANAEROBIC Blood Culture adequate volume   Culture   Final    NO GROWTH 3 DAYS Performed at [facility], [address] [place], [place] [address]    Report Status PENDING  Incomplete  Respiratory (~20 pathogens) panel by PCR     Status: None   Collection Time: 10/05/23  6:06 PM   Specimen: Nasopharyngeal Swab; Respiratory  Result Value Ref Range Status   Adenovirus NOT DETECTED NOT DETECTED Final   Coronavirus 229E NOT DETECTED NOT DETECTED Final    Comment: (NOTE) The Coronavirus on the Respiratory Panel, DOES NOT test for the novel  Coronavirus (2019 nCoV)    Coronavirus HKU1 NOT DETECTED NOT DETECTED Final   Coronavirus NL63 NOT DETECTED NOT DETECTED Final   Coronavirus OC43 NOT DETECTED NOT DETECTED Final   Metapneumovirus NOT DETECTED NOT DETECTED Final   Rhinovirus / Enterovirus NOT DETECTED NOT DETECTED Final   Influenza A NOT DETECTED NOT DETECTED Final   Influenza B NOT DETECTED NOT DETECTED Final   Parainfluenza Virus 1 NOT DETECTED NOT DETECTED Final   Parainfluenza Virus 2 NOT DETECTED NOT DETECTED Final   Parainfluenza Virus 3 NOT DETECTED NOT DETECTED Final   Parainfluenza Virus 4 NOT DETECTED NOT DETECTED Final   Respiratory Syncytial Virus NOT DETECTED NOT DETECTED Final   Bordetella pertussis NOT DETECTED NOT DETECTED Final   Bordetella Parapertussis NOT  DETECTED NOT DETECTED Final   Chlamydophila pneumoniae NOT DETECTED NOT DETECTED Final   Mycoplasma pneumoniae NOT DETECTED NOT DETECTED Final    Comment: Performed at [facility], [address] [place], [place] [address]    Labs: CBC: Recent Labs  Lab 10/04/23 1005 10/05/23 0445 10/05/23 1553 10/06/23 0248 10/07/23 0433  WBC 4.4 4.6 4.1 5.1 4.5  NEUTROABS 3.0  --   --   --   --   HGB 12.4* 10.7* 10.6* 10.9* 10.7*  HCT 41.6 34.3* 34.1* 32.7* 32.2*  MCV 94.8 93.2 91.2 89.3 87.5  PLT 157 275 133* 249 145*   Basic Metabolic Panel: Recent Labs  Lab 10/04/23 1005 10/05/23 0445 10/05/23 1553 10/06/23 0248 10/07/23 0433  NA 145 139 143 141 141  K 4.5 3.8 4.2 3.7 3.7  CL 95* 91* 92* 92* 97*  CO2 38* 36* 41* 37* 33*  GLUCOSE 104* 236* 84 87 83  BUN 22 26* 26* 25* 20  CREATININE 1.09 0.99 1.09 1.04 1.09  CALCIUM 8.4* 7.5* 7.8* 7.8* 7.8*  MG 2.2  --  2.1  --   --  PHOS  --   --  3.8  --   --    Liver Function Tests: Recent Labs  Lab 10/04/23 1005 10/05/23 0445 10/05/23 1553  AST 14* 13* 14*  ALT 14 10 9   ALKPHOS 77 60 59  BILITOT 0.7 0.5 0.4  PROT 7.4 6.1* 6.0*  ALBUMIN 3.5 2.7* 2.6*   CBG: Recent Labs  Lab 10/07/23 0539 10/07/23 1807 10/07/23 2352 10/08/23 0539 10/08/23 1254  GLUCAP 92 99 96 91 82    Discharge time spent: greater than 30 minutes.  Signed: Charlie Patterson, MD [facility] 10/08/2023

## 2023-10-10 LAB — CULTURE, BLOOD (ROUTINE X 2)
Culture: NO GROWTH
Culture: NO GROWTH
Special Requests: ADEQUATE
Special Requests: ADEQUATE
